# Patient Record
Sex: Male | Born: 1955
Health system: Southern US, Community
[De-identification: ages and names within clinical notes are randomized; demographics above are authoritative.]

## PROBLEM LIST (undated history)

## (undated) DIAGNOSIS — Z87898 Personal history of other specified conditions: Secondary | ICD-10-CM

## (undated) DIAGNOSIS — E785 Hyperlipidemia, unspecified: Secondary | ICD-10-CM

## (undated) DIAGNOSIS — Z9889 Other specified postprocedural states: Secondary | ICD-10-CM

## (undated) DIAGNOSIS — K219 Gastro-esophageal reflux disease without esophagitis: Secondary | ICD-10-CM

## (undated) DIAGNOSIS — Z85820 Personal history of malignant melanoma of skin: Secondary | ICD-10-CM

## (undated) DIAGNOSIS — Z972 Presence of dental prosthetic device (complete) (partial): Secondary | ICD-10-CM

## (undated) DIAGNOSIS — I10 Essential (primary) hypertension: Secondary | ICD-10-CM

## (undated) DIAGNOSIS — N400 Enlarged prostate without lower urinary tract symptoms: Secondary | ICD-10-CM

## (undated) DIAGNOSIS — K08109 Complete loss of teeth, unspecified cause, unspecified class: Secondary | ICD-10-CM

## (undated) DIAGNOSIS — R972 Elevated prostate specific antigen [PSA]: Secondary | ICD-10-CM

## (undated) DIAGNOSIS — M1990 Unspecified osteoarthritis, unspecified site: Secondary | ICD-10-CM

## (undated) DIAGNOSIS — G4730 Sleep apnea, unspecified: Secondary | ICD-10-CM

## (undated) DIAGNOSIS — C159 Malignant neoplasm of esophagus, unspecified: Secondary | ICD-10-CM

## (undated) HISTORY — DX: Malignant neoplasm of esophagus, unspecified: C15.9

## (undated) HISTORY — DX: Essential (primary) hypertension: I10

## (undated) HISTORY — PX: EYE SURGERY: SHX253

---

## 2007-10-24 HISTORY — PX: COLONOSCOPY: SHX174

## 2007-11-21 ENCOUNTER — Ambulatory Visit: Payer: Self-pay | Admitting: Infectious Diseases

## 2007-11-21 ENCOUNTER — Inpatient Hospital Stay (HOSPITAL_COMMUNITY): Admission: EM | Admit: 2007-11-21 | Discharge: 2007-11-22 | Payer: Self-pay | Admitting: Emergency Medicine

## 2007-11-21 ENCOUNTER — Encounter: Payer: Self-pay | Admitting: Infectious Diseases

## 2007-11-27 ENCOUNTER — Ambulatory Visit: Payer: Self-pay | Admitting: Hospitalist

## 2007-11-27 ENCOUNTER — Encounter (INDEPENDENT_AMBULATORY_CARE_PROVIDER_SITE_OTHER): Payer: Self-pay | Admitting: *Deleted

## 2007-11-27 DIAGNOSIS — E785 Hyperlipidemia, unspecified: Secondary | ICD-10-CM | POA: Insufficient documentation

## 2007-11-27 DIAGNOSIS — R0789 Other chest pain: Secondary | ICD-10-CM | POA: Insufficient documentation

## 2007-12-16 ENCOUNTER — Encounter (INDEPENDENT_AMBULATORY_CARE_PROVIDER_SITE_OTHER): Payer: Self-pay | Admitting: *Deleted

## 2008-01-28 ENCOUNTER — Ambulatory Visit: Payer: Self-pay | Admitting: Internal Medicine

## 2008-01-28 ENCOUNTER — Encounter (INDEPENDENT_AMBULATORY_CARE_PROVIDER_SITE_OTHER): Payer: Self-pay | Admitting: *Deleted

## 2008-01-28 LAB — CONVERTED CEMR LAB
ALT: 29 units/L (ref 0–53)
AST: 20 units/L (ref 0–37)
Albumin: 4.7 g/dL (ref 3.5–5.2)
Alkaline Phosphatase: 62 units/L (ref 39–117)
BUN: 14 mg/dL (ref 6–23)
CO2: 24 meq/L (ref 19–32)
Cholesterol: 186 mg/dL (ref 0–200)
Creatinine, Ser: 1.11 mg/dL (ref 0.40–1.50)
Glucose, Bld: 93 mg/dL (ref 70–99)
Sodium: 142 meq/L (ref 135–145)
Total Bilirubin: 0.6 mg/dL (ref 0.3–1.2)
Total Protein: 7.7 g/dL (ref 6.0–8.3)
Triglycerides: 232 mg/dL — ABNORMAL HIGH (ref <150)

## 2010-05-02 ENCOUNTER — Ambulatory Visit (HOSPITAL_COMMUNITY): Admission: RE | Admit: 2010-05-02 | Discharge: 2010-05-02 | Payer: Self-pay | Admitting: Surgery

## 2010-05-02 HISTORY — PX: OTHER SURGICAL HISTORY: SHX169

## 2011-01-08 LAB — SURGICAL PCR SCREEN: Staphylococcus aureus: NEGATIVE

## 2011-01-08 LAB — DIFFERENTIAL
Eosinophils Relative: 3 % (ref 0–5)
Lymphocytes Relative: 21 % (ref 12–46)
Monocytes Absolute: 0.6 10*3/uL (ref 0.1–1.0)

## 2011-01-08 LAB — BASIC METABOLIC PANEL
CO2: 27 mEq/L (ref 19–32)
Calcium: 9.8 mg/dL (ref 8.4–10.5)
Chloride: 106 mEq/L (ref 96–112)
GFR calc Af Amer: >60 mL/min (ref >60)
GFR calc non Af Amer: >60 mL/min (ref >60)
Glucose, Bld: 100 mg/dL — ABNORMAL HIGH (ref 70–99)
Potassium: 3.9 mEq/L (ref 3.5–5.1)
Sodium: 141 mEq/L (ref 135–145)

## 2011-01-08 LAB — CBC
HCT: 49.4 % (ref 39.0–52.0)
MCH: 35.5 pg — ABNORMAL HIGH (ref 26.0–34.0)
Platelets: 201 10*3/uL (ref 150–400)
RDW: 13.1 % (ref 11.5–15.5)

## 2011-03-07 NOTE — Discharge Summary (Signed)
NAME:  Nathaniel Jennings, Nathaniel Jennings NO.:  [id number]   MEDICAL RECORD NO.:  [id number]          PATIENT TYPE:  INP   LOCATION:  6529                         FACILITY:  [facility]   PHYSICIAN:  Lacretia Leigh. Hatcher, M.D.DATE OF BIRTH:  [birth year]   DATE OF ADMISSION:  11/21/2007  DATE OF DISCHARGE:  11/22/2007                               DISCHARGE SUMMARY   DISCHARGE DIAGNOSES:  1. Atypical chest discomfort.  2. Hypertension.   DISCHARGE MEDICATIONS:  The patient was discharged with no medications.  However, it was recommended that he try over-the-counter omeprazole  should he experience symptoms of gastroesophageal reflux.   The patient was discharged home and is to follow up in the outpatient  clinic with Dr. Laveda Abbe on November 27, 2007, at 11:15 a.m.  At that  time, the patient is to be evaluated for any further episodes of chest  discomfort as well as followed up with his elevated blood pressure.   PROCEDURES PERFORMED:  1. EKG:  Normal sinus rhythm, normal EKG.  2. Chest x-ray:  Impression:  Widened superior mediastinum, possible      artifact of AP projection, need PA and lateral for further      assessment.  3. CT angiogram of the chest:  Impression:  No acute findings, no      evidence of abdominal aortic aneurysm or dissection.  Possible tiny      gallstones.  4. CT angiogram of the abdomen:  Impression:  Again, no acute      findings, no evidence of abdominal aortic aneurysm or dissection.      Probable tiny gallstones.  5. A 2-D echocardiogram:  Overall left ventricular systolic function      was normal.  Left ventricular ejection fraction was estimated      between 55% and 60%.  There was no left ventricular regional wall      motion abnormalities.   CONSULTATIONS:  None.   ADMITTING HISTORY AND PHYSICAL:  The patient is a 55 year old male with  noncontributory medical history presenting with chest pressure and  bilateral upper and lower extremity tingling  sensations while driving to  work.  The morning of the incident, the patient describes the onset of  lower extremity tingling beginning proximally and extending distally  with concomitant upper extremity sensations.  He reports that at the  same time, he experienced what he describes as midline substernal  pressure.  The discomfort was waxing and waning in nature and lasted for  several hours throughout its course.  He denies chest pain, nausea,  vomiting, diaphoresis, light-headedness or radiation of the chest  discomfort but does endorse the feeling of needing to take a deep  breath.  All of this he attributes to indigestion.  Nothing worsened or  alleviated the symptoms.  He additionally denies cough, any recent  respiratory illness and no lower extremity edema or dyspnea on exertion  but does support having had palpitations in the past.   ADMITTING LABORATORIES:  Point of care:  Cardiac markers:  CK-MB less  than 1, troponin less than 0.05, myoglobin 65.4, D-dimer  0.25,  creatinine 1.1.  CBC:  7.2, hemoglobin 17.2, platelets 247.  Basic  metabolic panel revealed sodium of 139, potassium 3.6, chloride 106, BUN  16.   HOSPITAL COURSE:  Atypical chest discomfort:  While the patient has no  cardiac history, he does have several risk factors for coronary artery  disease, those being age over 20, gender, history of cigarette smoking,  and questionable hypertension.  Upon arrival to the emergency  department, the patient endorses sensation of his chest discomfort.  In  the emergency department, he was given 40 mg of Protonix IV push with  subsequent complete resolution of his symptoms.  Given the widened  mediastinum found on chest x-ray, further investigation with CT  angiogram of the chest was warranted out of concern for aortic  dissection.  Additionally, given the patient's risk factors, it was  deemed warranted to admit him and observe him overnight with further  cardiac workup.   Throughout the course of his 24-hour admission, patient  experienced no other episodes of chest pain, and there were no findings  as a result of his workup that were suggestive of any acute coronary  syndrome.  Additionally, the patient was maintained on Lopressor  throughout his 24-hour duration of stay which appropriately lowered his  blood pressure.  Upon awakening this morning, the patient was anxious to  depart the hospital, feeling much improved over the previous 24 hours.  He was educated on smoking cessation and encouraged to follow up in the  outpatient clinic to reassess his elevated blood pressures as well as  the complete resolution of his atypical chest discomfort.   DISCHARGE LABORATORIES AND VITALS:  VITALS:  Temp 97.6,  systolic BP  115/46, pulse 63, respiration 20, saturating 95% on room air.   CBC:  8.5, hemoglobin 15.2, platelets 217.  Basic metabolic panel:  098,  3.8, 106, 28, 11, 0.92, 86.  Cardiac enzymes negative x3 with final CK  113, MB 0.9, troponin 0.02.  Fasting lipid panel:  Cholesterol 172,  triglycerides 269, HDL 29, LDL 89; hemoglobin A1c was 5.4.  Urine  microalbumin 0.42.      Olene Craven, M.D.  Electronically Signed      Lacretia Leigh. Ninetta Lights, M.D.  Electronically Signed    MC/MEDQ  D:  11/22/2007  T:  11/22/2007  Job:  [id number]   cc:   Hollace Hayward, M.D.

## 2011-07-13 LAB — DIFFERENTIAL
Basophils Absolute: 0
Basophils Absolute: 0.1
Basophils Relative: 1
Eosinophils Absolute: 0.3
Eosinophils Absolute: 0.5
Eosinophils Relative: 4
Lymphocytes Relative: 32
Lymphs Abs: 3.3
Monocytes Absolute: 0.5
Monocytes Relative: 7
Monocytes Relative: 7
Neutro Abs: 4
Neutro Abs: 4
Neutrophils Relative %: 47
Neutrophils Relative %: 56

## 2011-07-13 LAB — CARDIAC PANEL(CRET KIN+CKTOT+MB+TROPI)
CK, MB: 0.9
Relative Index: 0.8
Relative Index: INVALID
Relative Index: INVALID
Total CK: 113
Total CK: 93
Troponin I: 0.02
Troponin I: <0.01

## 2011-07-13 LAB — POCT CARDIAC MARKERS
CKMB, poc: <1 — ABNORMAL LOW
CKMB, poc: <1 — ABNORMAL LOW
Myoglobin, poc: 65.4
Troponin i, poc: <0.05

## 2011-07-13 LAB — CREATININE, URINE, RANDOM: Creatinine, Urine: 77.5

## 2011-07-13 LAB — MICROALBUMIN, URINE: Microalb, Ur: 0.42

## 2011-07-13 LAB — CBC
Hemoglobin: 15.2
MCV: 97.7
Platelets: 247
RBC: 4.35
RBC: 4.99
RDW: 12.7
WBC: 7.2

## 2011-07-13 LAB — POCT I-STAT CREATININE: Creatinine, Ser: 1.1

## 2011-07-13 LAB — LIPID PANEL
Cholesterol: 172
LDL Cholesterol: 89
VLDL: 54 — ABNORMAL HIGH

## 2011-07-13 LAB — BASIC METABOLIC PANEL
CO2: 28
Chloride: 106
GFR calc non Af Amer: >60
Sodium: 140

## 2011-07-13 LAB — I-STAT 8, (EC8 V) (CONVERTED LAB): Glucose, Bld: 127 — ABNORMAL HIGH

## 2011-07-14 ENCOUNTER — Ambulatory Visit
Admission: RE | Admit: 2011-07-14 | Discharge: 2011-07-14 | Disposition: A | Discharge status: Home / Self Care | Payer: BC Managed Care – PPO | Source: Ambulatory Visit | Attending: Physician Assistant | Admitting: Physician Assistant

## 2011-07-14 ENCOUNTER — Other Ambulatory Visit: Payer: Self-pay | Admitting: Physician Assistant

## 2011-07-14 DIAGNOSIS — M25562 Pain in left knee: Secondary | ICD-10-CM

## 2011-07-14 DIAGNOSIS — M25462 Effusion, left knee: Secondary | ICD-10-CM

## 2011-07-14 DIAGNOSIS — M25561 Pain in right knee: Secondary | ICD-10-CM

## 2011-07-14 DIAGNOSIS — M25461 Effusion, right knee: Secondary | ICD-10-CM

## 2012-11-15 ENCOUNTER — Other Ambulatory Visit (HOSPITAL_COMMUNITY): Payer: Self-pay | Admitting: Cardiovascular Disease

## 2012-11-15 DIAGNOSIS — I10 Essential (primary) hypertension: Secondary | ICD-10-CM

## 2012-11-19 ENCOUNTER — Ambulatory Visit (HOSPITAL_COMMUNITY)
Admission: RE | Admit: 2012-11-19 | Discharge: 2012-11-19 | Disposition: A | Discharge status: Home / Self Care | Payer: BC Managed Care – PPO | Source: Ambulatory Visit | Attending: Cardiovascular Disease | Admitting: Cardiovascular Disease

## 2012-11-19 DIAGNOSIS — R002 Palpitations: Secondary | ICD-10-CM | POA: Insufficient documentation

## 2012-11-19 DIAGNOSIS — I059 Rheumatic mitral valve disease, unspecified: Secondary | ICD-10-CM | POA: Insufficient documentation

## 2012-11-19 DIAGNOSIS — I10 Essential (primary) hypertension: Secondary | ICD-10-CM

## 2012-11-19 DIAGNOSIS — I369 Nonrheumatic tricuspid valve disorder, unspecified: Secondary | ICD-10-CM | POA: Insufficient documentation

## 2012-11-19 HISTORY — PX: TRANSTHORACIC ECHOCARDIOGRAM: SHX275

## 2012-11-19 NOTE — Progress Notes (Signed)
2D Echo Performed 11/19/2012    Dann Galicia, RCS  

## 2013-03-13 ENCOUNTER — Encounter: Payer: Self-pay | Admitting: Cardiovascular Disease

## 2013-03-14 ENCOUNTER — Ambulatory Visit (INDEPENDENT_AMBULATORY_CARE_PROVIDER_SITE_OTHER): Payer: BC Managed Care – PPO | Admitting: Cardiovascular Disease

## 2013-03-14 ENCOUNTER — Encounter: Payer: Self-pay | Admitting: Cardiovascular Disease

## 2013-03-14 VITALS — BP 136/88 | HR 57 | Ht 67.0 in | Wt 179.3 lb

## 2013-03-14 DIAGNOSIS — I10 Essential (primary) hypertension: Secondary | ICD-10-CM

## 2013-03-14 DIAGNOSIS — E785 Hyperlipidemia, unspecified: Secondary | ICD-10-CM

## 2013-03-14 DIAGNOSIS — R42 Dizziness and giddiness: Secondary | ICD-10-CM

## 2013-03-14 MED ORDER — NIACIN ER (ANTIHYPERLIPIDEMIC) 500 MG PO TBCR: 500.0000 mg | EXTENDED_RELEASE_TABLET | Freq: Every day | ORAL | Status: DC

## 2013-03-14 NOTE — Progress Notes (Deleted)
Patient ID: Nathaniel Jennings, male    DOB: 1955-10-28, 57 y.o.   MRN: [id number]  HPI: Nathaniel Jennings, is a 57 y.o. male who presents to the office today for cardiologic evaluation. He has a history of hypertension, as well as tachypalpitations. Most recently he has been on metoprolol succinate to control  ectopy. When I saw him in February 2014, I further titrate his metoprolol succinate to 75 mg daily. Patient does work [employer] representative. He has been found to have elevated lipids consistent with an atherogenic dyslipidemia pattern. He recently also had an episode of vertigo. He has a remote tobacco history and starting at age17 but quit approximately 2 years ago.  WhenI had initially seen Nathaniel Jennings, his total cholesterol was 192, triglycerides 228, VLDL 46, HDL 38, LDL cholesterol 108. I started him on simvastatin 20 mg. He subsequently underwent an MR profile which did show but still is consistent with an atherogenic dyslipidemia pattern. Total cholesterol is now 138. However, triglycerides are elevated at 219 HDL 37. LDL is 57. However, he still has increased small LDL particles of 765. His HDL is low at 26.9. The LDL size is increased at 54.1. He also has significant increased insulin resistance were 85 with metabolic syndrome insulin resistance.he presents to the office today for followup evaluation.  Past Medical History  Diagnosis Date  . Hypertension   . Melanoma   . Palpitations 11/19/2012    2D Echo - normal study    No past surgical history on file.  No Known Allergies  Current Outpatient Prescriptions  Medication Sig Dispense Refill  . aspirin 81 MG tablet Take 81 mg by mouth daily.      . meclizine (ANTIVERT) 25 MG tablet Take 25 mg by mouth 3 (three) times daily as needed.      . metoprolol succinate (TOPROL-XL) 50 MG 24 hr tablet Take 75 mg by mouth daily. Take with or immediately following a meal.      . omeprazole (PRILOSEC) 20 MG capsule Take 20 mg by mouth daily.      .  simvastatin (ZOCOR) 20 MG tablet Take 20 mg by mouth every evening.       No current facility-administered medications for this visit.    [name] He has 2 children 6 grandchildren. He quit tobacco 2 years ago. He does drink approximately 10 beers per week. He does admit to having non diet soft drinks.  ROVis negative for fever chills and night sweats. He denies or palpitations. He did have an episode of vertigo recently. He has tolerated the initiation of simvastatin and denies myalgias or arthralgias.there is no chest pain. He denies PND orthopnea. He denies bleeding. Other system review is negative.  PE BP 136/88  Pulse 57  Ht 5\' 7"  (1.702 m)  Wt 179 lb 4.8 oz (81.33 kg)  BMI 28.08 kg/m2  General: Alert, oriented, no distress.  HEENT: Normocephalic, atraumatic. Pupils round and reactive; sclera anicteric; Fundi ** Nose with nasal septal hypertrophy Mouth/Parynx benign; Mallinpatti scale 2 Neck: No JVD, no carotid briuts Lungs: clear to ausculatation and percussion; no wheezing or rales Heart: RRR, s1 s2 normal  Abdomen: soft, nontender; no hepatosplenomehaly, BS+; abdominal aorta nontender and not dilated by palpation. Pulses 2+ Extremities: no clubbinbg cyanosis or edema, Homan's sign negative  Neurologic: grossly nonfocal   ECG sinus rhythm at 57 per minute. Normal intervals.  LABS:  BMET    Component Value Date/Time   NA 141 04/28/2010 1030   K  3.9 04/28/2010 1030   CL 106 04/28/2010 1030   CO2 27 04/28/2010 1030   GLUCOSE 100* 04/28/2010 1030   BUN 14 04/28/2010 1030   CREATININE 1.04 04/28/2010 1030   CALCIUM 9.8 04/28/2010 1030   GFRNONAA >60 04/28/2010 1030   GFRAA  Value: >60        The eGFR has been calculated using the MDRD equation. This calculation has not been validated in all clinical situations. eGFR's persistently <60 mL/min signify possible Chronic Kidney Disease. 04/28/2010 1030     Hepatic Function Panel     Component Value Date/Time   PROT 7.7 01/28/2008 2019   ALBUMIN  4.7 01/28/2008 2019   AST 20 01/28/2008 2019   ALT 29 01/28/2008 2019   ALKPHOS 62 01/28/2008 2019   BILITOT 0.6 01/28/2008 2019     CBC    Component Value Date/Time   WBC 7.7 04/28/2010 1030   RBC 4.95 04/28/2010 1030   HGB 17.6 REPEATED TO VERIFY* 04/28/2010 1030   HCT 49.4 04/28/2010 1030   PLT 201 04/28/2010 1030   MCV 98.8 04/28/2010 1030   MCH 35.5* 04/28/2010 1030   MCHC 35.9 04/28/2010 1030   RDW 13.1 04/28/2010 1030   LYMPHSABS 1.6 04/28/2010 1030   MONOABS 0.6 04/28/2010 1030   EOSABS 0.2 04/28/2010 1030   BASOSABS 0.1 04/28/2010 1030     BNP No results found for this basename: probnp    Lipid Panel     Component Value Date/Time   CHOL 186 01/28/2008 2019   TRIG 232* 01/28/2008 2019   HDL 38* 01/28/2008 2019   CHOLHDL 4.9 Ratio 01/28/2008 2019   VLDL 46* 01/28/2008 2019   LDLCALC 102* 01/28/2008 2019     RADIOLOGY: No results found.    ASSESSMENT AND PLAN:  From a cardiac standpoint, Nathaniel Jennings blood pressure is well controlled. He is not having any further activity. I did review his NMR lipoprotein detail. He does have significant increased insulin resistance and metabolic syndrome. I have recommended the addition of Niaspan 500 mg to his simvastatin 20 mg. I also have recommended initiation of over-the-counter fish oil. We discussed reduction of sugars and sweets. We discussed changing to diet soft drinks. Discussed increased exercise. A repeat NMR lipoprotein I will be obtained in 3 months. I will see him in 4 months for followup and evaluation.      KELLY,THOMAS A 03/14/2013 8:55 AM    HPI    Review of Systems    Physical Exam     .

## 2013-03-14 NOTE — Patient Instructions (Addendum)
Your physician has recommended you make the following change in your medication: Start Niaspan ER 500. This will be sent in to your pharmacy.Continue meds as before. Buy some over the counter fish oil and take as directed.  Your physician recommends that you return for lab work in: 3months.   Your physician recommends that you schedule a follow-up appointment in: 4 montjs. Nathaniel Jennings

## 2013-03-14 NOTE — Progress Notes (Signed)
HPI: Nathaniel Jennings, is a 57 y.o. male who presents to the office today for cardiologic evaluation. He has a history of hypertension, as well as tachypalpitations. Most recently he has been on metoprolol succinate to control  ectopy. When I saw him in February 2014, I further titrate his metoprolol succinate to 75 mg daily. Patient does work [employer] representative. He has been found to have elevated lipids consistent with an atherogenic dyslipidemia pattern. He recently also had an episode of vertigo. He has a remote tobacco history and starting at age17 but quit approximately 2 years ago.  When I had initially seen Nathaniel Jennings, his total cholesterol was 192, triglycerides 228, VLDL 46, HDL 38, LDL cholesterol 108. I started him on simvastatin 20 mg. He subsequently underwent an MR profile which did show but still is consistent with an atherogenic dyslipidemia pattern. Total cholesterol is now 138. However, triglycerides are elevated at 219 HDL 37. LDL is 57. However, he still has increased small LDL particles of 765. His HDL is low at 26.9. The LDL size is increased at 54.1. He also has significant increased insulin resistance with an IR score of 85 consistent with metabolic syndrome/insulin resistance. He presents to the office today for followup evaluation.      Past Medical History  Diagnosis Date  . Hypertension   . Melanoma   . Palpitations 11/19/2012    2D Echo - normal study    No past surgical history on file.  No Known Allergies  Current Outpatient Prescriptions  Medication Sig Dispense Refill  . aspirin 81 MG tablet Take 81 mg by mouth daily.      . meclizine (ANTIVERT) 25 MG tablet Take 25 mg by mouth 3 (three) times daily as needed.      . metoprolol succinate (TOPROL-XL) 50 MG 24 hr tablet Take 75 mg by mouth daily. Take with or immediately following a meal.      . omeprazole (PRILOSEC) 20 MG capsule Take 20 mg by mouth daily.      . simvastatin (ZOCOR) 20 MG tablet Take 20 mg by mouth every  evening.       No current facility-administered medications for this visit.    Socially he has 2 children 6 grandchildren. He quit tobacco 2 years ago. He does drink approximately 10 beers per week. He does admit to having non diet soft drinks.  ROV is negative for fever chills and night sweats. He denies or palpitations. He did have an episode of vertigo recently. He has tolerated the initiation of simvastatin and denies myalgias or arthralgias.there is no chest pain. He denies PND orthopnea. He denies bleeding. Other system review is negative.  PE BP 136/88  Pulse 57  Ht 5\' 7"  (1.702 m)  Wt 179 lb 4.8 oz (81.33 kg)  BMI 28.08 kg/m2  General: Alert, oriented, no distress.  HEENT: Normocephalic, atraumatic. Pupils round and reactive; sclera anicteric; Fundi ** Nose with nasal septal hypertrophy Mouth/Parynx benign; Mallinpatti scale 2 Neck: No JVD, no carotid briuts Lungs: clear to ausculatation and percussion; no wheezing or rales Heart: RRR, s1 s2 normal 1/6 sem Abdomen: soft, nontender; no hepatosplenomehaly, BS+; abdominal aorta nontender and not dilated by palpation. Pulses 2+ Extremities: no clubbinbg cyanosis or edema, Homan's sign negative  Neurologic: grossly nonfocal   ECG sinus rhythm at 57 per minute. Normal intervals.  LABS:  BMET    Component Value Date/Time   NA 141 04/28/2010 1030   K 3.9 04/28/2010 1030   CL 106  04/28/2010 1030   CO2 27 04/28/2010 1030   GLUCOSE 100* 04/28/2010 1030   BUN 14 04/28/2010 1030   CREATININE 1.04 04/28/2010 1030   CALCIUM 9.8 04/28/2010 1030   GFRNONAA >60 04/28/2010 1030   GFRAA  Value: >60        The eGFR has been calculated using the MDRD equation. This calculation has not been validated in all clinical situations. eGFR's persistently <60 mL/min signify possible Chronic Kidney Disease. 04/28/2010 1030     Hepatic Function Panel     Component Value Date/Time   PROT 7.7 01/28/2008 2019   ALBUMIN 4.7 01/28/2008 2019   AST 20 01/28/2008 2019    ALT 29 01/28/2008 2019   ALKPHOS 62 01/28/2008 2019   BILITOT 0.6 01/28/2008 2019     CBC    Component Value Date/Time   WBC 7.7 04/28/2010 1030   RBC 4.95 04/28/2010 1030   HGB 17.6 REPEATED TO VERIFY* 04/28/2010 1030   HCT 49.4 04/28/2010 1030   PLT 201 04/28/2010 1030   MCV 98.8 04/28/2010 1030   MCH 35.5* 04/28/2010 1030   MCHC 35.9 04/28/2010 1030   RDW 13.1 04/28/2010 1030   LYMPHSABS 1.6 04/28/2010 1030   MONOABS 0.6 04/28/2010 1030   EOSABS 0.2 04/28/2010 1030   BASOSABS 0.1 04/28/2010 1030     BNP No results found for this basename: probnp    Lipid Panel     Component Value Date/Time   CHOL 186 01/28/2008 2019   TRIG 232* 01/28/2008 2019   HDL 38* 01/28/2008 2019   CHOLHDL 4.9 Ratio 01/28/2008 2019   VLDL 46* 01/28/2008 2019   LDLCALC 102* 01/28/2008 2019     RADIOLOGY: No results found.    ASSESSMENT AND PLAN:  From a cardiac standpoint, Nathaniel Jennings blood pressure is well controlled. He is not having any further activity on metoprolol. I did review his NMR lipoprotein detail. He does have significant increased insulin resistance and metabolic syndrome. I have recommended the addition of Niaspan 500 mg to his simvastatin 20 mg. I also have recommended initiation of over-the-counter fish oil. We discussed reduction of sugars and sweets, dietary modification. We discussed changing to diet soft drinks. Discussed increased exercise. A repeat NMR lipoprotein I will be obtained in 3 months. I will see him in 4 months for followup and evaluation.      KELLY,THOMAS A 03/14/2013 8:55 AM

## 2013-07-15 ENCOUNTER — Other Ambulatory Visit: Payer: Self-pay | Admitting: Cardiovascular Disease

## 2013-07-15 NOTE — Telephone Encounter (Signed)
Rx was sent to pharmacy electronically. 

## 2014-04-13 ENCOUNTER — Other Ambulatory Visit: Payer: Self-pay | Admitting: *Deleted

## 2014-04-13 MED ORDER — METOPROLOL SUCCINATE ER 50 MG PO TB24: ORAL_TABLET | ORAL | Status: DC

## 2014-04-13 NOTE — Telephone Encounter (Signed)
Rx refill sent to patient pharmacy   

## 2014-04-30 ENCOUNTER — Other Ambulatory Visit: Payer: Self-pay | Admitting: *Deleted

## 2014-04-30 MED ORDER — METOPROLOL SUCCINATE ER 50 MG PO TB24: ORAL_TABLET | ORAL | Status: DC

## 2014-04-30 NOTE — Telephone Encounter (Signed)
Rx was sent to pharmacy electronically. 

## 2015-06-25 ENCOUNTER — Other Ambulatory Visit: Payer: Self-pay | Admitting: Urology

## 2015-06-25 ENCOUNTER — Encounter (HOSPITAL_BASED_OUTPATIENT_CLINIC_OR_DEPARTMENT_OTHER): Payer: Self-pay | Admitting: *Deleted

## 2015-06-29 ENCOUNTER — Encounter (HOSPITAL_BASED_OUTPATIENT_CLINIC_OR_DEPARTMENT_OTHER): Payer: Self-pay | Admitting: *Deleted

## 2015-06-29 ENCOUNTER — Other Ambulatory Visit: Payer: Self-pay | Admitting: Urology

## 2015-06-29 NOTE — Progress Notes (Signed)
NPO AFTER MN.  ARRIVE AT 0715.  NEEDS ISTAT AND EKG.  WILL TAKE PRILOSEC AND TOPROL AM DOS W/ SIPS OF WATER AND DO FLEET ENEMA.

## 2015-07-01 NOTE — H&P (Signed)
History of Present Illness Nathaniel Jennings is scheduled for ultrasound prostate biopsy for elevated PSA. His PSA has been slowly trending up. It was 3.4 in May 2015, 4.1 in May of this year, 4.5 on June 22. Repeat PSA on 7/18 is 4.08 with 18% free PSA. He could not tolerate the ultrasound probe. It was too painful to him. The procedure was cancelled. Will schedule under general anesthesia. He does not have a family history of prostate cancer.   Past Medical History Problems  1. History of arthritis (Z87.39) 2. History of heartburn (Z87.898) 3. History of hypercholesterolemia (Z86.39) 4. History of hypertension (Z86.79) 5. History of nonmelanoma skin cancer (Z66.294)  Surgical History Problems  1. History of Skin Debridement  Current Meds 1. Garlic CAPS;  Therapy: (Recorded:18Jul2016) to Recorded 2. Levofloxacin 500 MG Oral Tablet (Levaquin); Take 1 pill day before procedure, Take 1 pill  day of procedure, Take 1 pill day after procedure;  Therapy: 21Jul2016 to (Last Rx:21Jul2016)  Requested for: 21Jul2016 Ordered 3. Metoprolol Tartrate 75 MG Oral Tablet;  Therapy: (Recorded:18Jul2016) to Recorded 4. Multi-Vitamin TABS;  Therapy: (Recorded:18Jul2016) to Recorded 5. Omega 3 CAPS;  Therapy: (Recorded:18Jul2016) to Recorded 6. PriLOSEC 20 MG Oral Capsule Delayed Release (Omeprazole);  Therapy: (Recorded:18Jul2016) to Recorded 7. Vitamin B-12 TABS;  Therapy: (Recorded:18Jul2016) to Recorded  Allergies Medication  1. No Known Drug Allergies  Family History Problems  1. Family history of Black lung : Father 2. Family history of renal failure (Z84.1) : Mother  Social History Problems  1. Father deceased   15 black lung 2. Former smoker [id number]   2 pkd for 30 yrs quit 6 yrs ago 3. Mother deceased   61 renal failure 4. Number of children   1 son/ 1 daughter 5. Occupation   Architect work / Librarian, academic  Review of Systems Genitourinary, constitutional, skin,  eye, otolaryngeal, hematologic/lymphatic, cardiovascular, pulmonary, endocrine, musculoskeletal, gastrointestinal, neurological and psychiatric system(s) were reviewed and pertinent findings if present are noted and are otherwise negative.  Genitourinary: urinary frequency and urinary stream starts and stops.  Gastrointestinal: heartburn.    Physical Exam Constitutional: Well nourished and well developed . No acute distress.  ENT:. The ears and nose are normal in appearance.  Neck: The appearance of the neck is normal and no neck mass is present.  Pulmonary: No respiratory distress and normal respiratory rhythm and effort.  Cardiovascular: Heart rate and rhythm are normal . No peripheral edema.  Abdomen: The abdomen is soft and nontender. No masses are palpated. No CVA tenderness. No hernias are palpable. No hepatosplenomegaly noted.  Rectal: Rectal exam demonstrates normal sphincter tone, no tenderness and no masses. Estimated prostate size is 2+. The prostate has no nodularity, is not indurated and is not tender. The left seminal vesicle is nonpalpable. The right seminal vesicle is nonpalpable. The perineum is normal on inspection.  Genitourinary: Examination of the penis demonstrates no discharge, no masses, no lesions and a normal meatus. The scrotum is without lesions. The right epididymis is palpably normal and non-tender. The left epididymis is palpably normal and non-tender. The right testis is non-tender and without masses. The left testis is non-tender and without masses.  Lymphatics: The femoral and inguinal nodes are not enlarged or tender.  Skin: Normal skin turgor, no visible rash and no visible skin lesions.  Neuro/Psych:. Mood and affect are appropriate.    Results/Data Urine [Data Includes: Last 1 Day]   [id number]  COLOR YELLOW   APPEARANCE CLEAR   SPECIFIC GRAVITY 1.010  pH 6.5   GLUCOSE NEGATIVE   BILIRUBIN NEGATIVE   KETONE NEGATIVE   BLOOD NEGATIVE   PROTEIN NEGATIVE    NITRITE NEGATIVE   LEUKOCYTE ESTERASE NEGATIVE    Assessment Assessed  1. Elevated prostate specific antigen (PSA) (R97.2)  Plan Benign localized prostatic hyperplasia with lower urinary tract symptoms (LUTS)  1. BIOPSY; Status:Hold For - Specimen/Data Collection; Requested for:01Sep2016;  Elevated prostate specific antigen (PSA)  2. PROSTATE BIOPSY ULTRASOUND; Status:Active; Requested for:01Sep2016;  Health Maintenance  3. UA With REFLEX; [Do Not Release]; Status:Resulted - Requires Verification;   Done:  [id number] 11:01AM  Ultrasound guided prostate biopsy under general anesthesia.

## 2015-07-02 ENCOUNTER — Encounter (HOSPITAL_BASED_OUTPATIENT_CLINIC_OR_DEPARTMENT_OTHER): Payer: Self-pay | Admitting: *Deleted

## 2015-07-02 ENCOUNTER — Ambulatory Visit (HOSPITAL_BASED_OUTPATIENT_CLINIC_OR_DEPARTMENT_OTHER): Payer: BLUE CROSS/BLUE SHIELD | Admitting: Certified Registered"

## 2015-07-02 ENCOUNTER — Ambulatory Visit (HOSPITAL_BASED_OUTPATIENT_CLINIC_OR_DEPARTMENT_OTHER)
Admission: RE | Admit: 2015-07-02 | Discharge: 2015-07-02 | Disposition: A | Discharge status: Home / Self Care | Payer: BLUE CROSS/BLUE SHIELD | Source: Ambulatory Visit | Attending: Urology | Admitting: Urology

## 2015-07-02 ENCOUNTER — Other Ambulatory Visit: Payer: Self-pay

## 2015-07-02 ENCOUNTER — Encounter (HOSPITAL_BASED_OUTPATIENT_CLINIC_OR_DEPARTMENT_OTHER)
Admission: RE | Disposition: A | Discharge status: Home / Self Care | Payer: Self-pay | Source: Ambulatory Visit | Attending: Urology

## 2015-07-02 DIAGNOSIS — I10 Essential (primary) hypertension: Secondary | ICD-10-CM | POA: Insufficient documentation

## 2015-07-02 DIAGNOSIS — Z87891 Personal history of nicotine dependence: Secondary | ICD-10-CM | POA: Insufficient documentation

## 2015-07-02 DIAGNOSIS — Z79899 Other long term (current) drug therapy: Secondary | ICD-10-CM | POA: Insufficient documentation

## 2015-07-02 DIAGNOSIS — K219 Gastro-esophageal reflux disease without esophagitis: Secondary | ICD-10-CM | POA: Diagnosis not present

## 2015-07-02 DIAGNOSIS — R972 Elevated prostate specific antigen [PSA]: Secondary | ICD-10-CM | POA: Diagnosis present

## 2015-07-02 DIAGNOSIS — M1990 Unspecified osteoarthritis, unspecified site: Secondary | ICD-10-CM | POA: Insufficient documentation

## 2015-07-02 DIAGNOSIS — E780 Pure hypercholesterolemia: Secondary | ICD-10-CM | POA: Diagnosis not present

## 2015-07-02 HISTORY — DX: Personal history of other specified conditions: Z87.898

## 2015-07-02 HISTORY — DX: Elevated prostate specific antigen (PSA): R97.20

## 2015-07-02 HISTORY — DX: Other specified postprocedural states: Z98.890

## 2015-07-02 HISTORY — DX: Complete loss of teeth, unspecified cause, unspecified class: Z97.2

## 2015-07-02 HISTORY — PX: PROSTATE BIOPSY: SHX241

## 2015-07-02 HISTORY — DX: Complete loss of teeth, unspecified cause, unspecified class: K08.109

## 2015-07-02 HISTORY — DX: Hyperlipidemia, unspecified: E78.5

## 2015-07-02 HISTORY — DX: Personal history of malignant melanoma of skin: Z85.820

## 2015-07-02 LAB — POCT I-STAT, CHEM 8
BUN: 20 mg/dL (ref 6–20)
Calcium, Ion: 1.26 mmol/L — ABNORMAL HIGH (ref 1.12–1.23)
Chloride: 102 mmol/L (ref 101–111)
Creatinine, Ser: 1 mg/dL (ref 0.61–1.24)
Glucose, Bld: 102 mg/dL — ABNORMAL HIGH (ref 65–99)
HEMATOCRIT: 46 % (ref 39.0–52.0)
HEMOGLOBIN: 15.6 g/dL (ref 13.0–17.0)
Potassium: 4.4 mmol/L (ref 3.5–5.1)
SODIUM: 140 mmol/L (ref 135–145)
TCO2: 26 mmol/L (ref 0–100)

## 2015-07-02 SURGERY — BIOPSY, PROSTATE, RECTAL APPROACH, WITH US GUIDANCE
Anesthesia: Monitor Anesthesia Care

## 2015-07-02 MED ORDER — MIDAZOLAM HCL 5 MG/5ML IJ SOLN
INTRAMUSCULAR | Status: DC | PRN
  Administered 2015-07-02: 2 mg via INTRAVENOUS

## 2015-07-02 MED ORDER — LACTATED RINGERS IV SOLN
INTRAVENOUS | Status: DC
  Filled 2015-07-02: qty 1000

## 2015-07-02 MED ORDER — MEPERIDINE HCL 25 MG/ML IJ SOLN
6.2500 mg | INTRAMUSCULAR | Status: DC | PRN
  Filled 2015-07-02: qty 1

## 2015-07-02 MED ORDER — EPHEDRINE SULFATE 50 MG/ML IJ SOLN
INTRAMUSCULAR | Status: DC | PRN
  Administered 2015-07-02: 5 mg via INTRAVENOUS
  Administered 2015-07-02: 10 mg via INTRAVENOUS

## 2015-07-02 MED ORDER — MIDAZOLAM HCL 2 MG/2ML IJ SOLN
INTRAMUSCULAR | Status: AC
  Filled 2015-07-02: qty 2

## 2015-07-02 MED ORDER — FENTANYL CITRATE (PF) 100 MCG/2ML IJ SOLN
25.0000 ug | INTRAMUSCULAR | Status: DC | PRN
  Filled 2015-07-02: qty 1

## 2015-07-02 MED ORDER — CEFTRIAXONE SODIUM 2 G IJ SOLR
INTRAMUSCULAR | Status: AC
  Filled 2015-07-02: qty 2

## 2015-07-02 MED ORDER — LACTATED RINGERS IV SOLN
INTRAVENOUS | Status: DC
  Administered 2015-07-02: 08:00:00 via INTRAVENOUS
  Filled 2015-07-02: qty 1000

## 2015-07-02 MED ORDER — DEXTROSE 5 % IV SOLN
1.0000 g | INTRAVENOUS | Status: DC
  Filled 2015-07-02: qty 10

## 2015-07-02 MED ORDER — PROPOFOL 500 MG/50ML IV EMUL
INTRAVENOUS | Status: DC | PRN
  Administered 2015-07-02: 160 ug/kg/min via INTRAVENOUS

## 2015-07-02 MED ORDER — FENTANYL CITRATE (PF) 100 MCG/2ML IJ SOLN
INTRAMUSCULAR | Status: DC | PRN
  Administered 2015-07-02: 50 ug via INTRAVENOUS

## 2015-07-02 MED ORDER — PROMETHAZINE HCL 25 MG/ML IJ SOLN
6.2500 mg | INTRAMUSCULAR | Status: DC | PRN
  Filled 2015-07-02: qty 1

## 2015-07-02 MED ORDER — FENTANYL CITRATE (PF) 100 MCG/2ML IJ SOLN
INTRAMUSCULAR | Status: AC
  Filled 2015-07-02: qty 4

## 2015-07-02 MED ORDER — DEXTROSE 5 % IV SOLN
2.0000 g | INTRAVENOUS | Status: AC
  Administered 2015-07-02: 2 g via INTRAVENOUS
  Filled 2015-07-02: qty 2

## 2015-07-02 SURGICAL SUPPLY — 19 items
BAG URINE LEG 19OZ MD ST LTX (BAG) IMPLANT
CATH FOLEY 2WAY SLVR  5CC 16FR (CATHETERS)
CATH FOLEY 2WAY SLVR 5CC 16FR (CATHETERS) IMPLANT
DRSG TEGADERM 4X4.75 (GAUZE/BANDAGES/DRESSINGS) IMPLANT
DRSG TEGADERM 8X12 (GAUZE/BANDAGES/DRESSINGS) IMPLANT
DRSG TELFA 3X8 NADH (GAUZE/BANDAGES/DRESSINGS) ×2 IMPLANT
GAUZE SPONGE 4X4 12PLY STRL LF (GAUZE/BANDAGES/DRESSINGS) IMPLANT
GLOVE BIO SURGEON STRL SZ7 (GLOVE) ×2 IMPLANT
MANIFOLD NEPTUNE II (INSTRUMENTS) IMPLANT
NDL SAFETY ECLIPSE 18X1.5 (NEEDLE) IMPLANT
NEEDLE HYPO 18GX1.5 SHARP (NEEDLE)
NEEDLE SPNL 22GX7 QUINCKE BK (NEEDLE) IMPLANT
PLUG CATH AND CAP STER (CATHETERS) IMPLANT
SET IRRIG Y TYPE TUR BLADDER L (SET/KITS/TRAYS/PACK) IMPLANT
SURGILUBE 2OZ TUBE FLIPTOP (MISCELLANEOUS) ×2 IMPLANT
SYR 20CC LL (SYRINGE) IMPLANT
SYRINGE 10CC LL (SYRINGE) IMPLANT
TOWEL OR 17X24 6PK STRL BLUE (TOWEL DISPOSABLE) ×2 IMPLANT
UNDERPAD 30X30 INCONTINENT (UNDERPADS AND DIAPERS) ×2 IMPLANT

## 2015-07-02 NOTE — Anesthesia Postprocedure Evaluation (Signed)
  Anesthesia Post-op Note  Patient: Nathaniel Jennings  Procedure(s) Performed: Procedure(s) (LRB): BIOPSY TRANSRECTAL ULTRASONIC PROSTATE (TUBP) (N/A)  Patient Location: PACU  Anesthesia Type: MAC  Level of Consciousness: awake and alert   Airway and Oxygen Therapy: Patient Spontanous Breathing  Post-op Pain: mild  Post-op Assessment: Post-op Vital signs reviewed, Patient's Cardiovascular Status Stable, Respiratory Function Stable, Patent Airway and No signs of Nausea or vomiting  Last Vitals:  Filed Vitals:   07/02/15 1022  BP: 140/83  Pulse: 61  Temp: 36.4 C  Resp: 16    Post-op Vital Signs: stable   Complications: No apparent anesthesia complications

## 2015-07-02 NOTE — Op Note (Signed)
Nathaniel Jennings is a 59 y.o.   07/02/2015  General  Preop diagnosis: Elevated PSA  Postop diagnosis: Same  Procedure done: Ultrasound guided prostate biopsy  Surgeon: Charlene Brooke. Ariel Wingrove  Anesthesia: Monitored anesthesia care  Indication: Patient is a 59 years old male who has a progressively rising PSA. It was 3.4 in May 2015, 4.1 in May 2016 4.5 on June 22. Repeat PSA on 7/18 is a 4.08 with 18% free PSA. Patient was scheduled for ultrasound prostate biopsy under local anesthesia. He could not tolerate the ultrasound probe. He is now scheduled for the procedure under anesthesia.  Procedure: Patient was identified by his wrist band and proper timeout was taken.  Under monitored anesthesia care he was prepped and draped and placed in the left lateral decubitus position. The transducer was inserted in the rectum. The seminal vesicles are normal. There is moderate prostatic hypertrophy. There is a prostatic cyst in the mid right lobe of the prostate. The prostate measures a 4.86 cm in length, 3.77 cm in height and 5.66 cm in width. The prostatic volume is 54.40 cc.  Under ultrasound guidance and with the biopsy needle a biopsy of the right lateral base and right medial base of the prostate was done. Biopsy of the right lateral mid gland and medial mid gland was done. Then a biopsy of the right lateral apex and right medial apex was done.  Biopsy of the left lateral and left medial base was done. Biopsy of the left lateral and left medial mid gland was done. Then biopsy of the left lateral apex and left medial apex was done. A total of 12 cores were obtained.  The transducer was then removed. Pressure was then applied to the prostate with a gauze in the rectum. There was no evidence of bleeding at the end of the procedure.   EBL: Minimal  Needles, sponges count: Correct  Patient tolerated the procedure well and left the OR in satisfactory condition to postanesthesia care unit.

## 2015-07-02 NOTE — Anesthesia Preprocedure Evaluation (Addendum)
Anesthesia Evaluation  Patient identified by MRN, date of birth, ID band Patient awake    Reviewed: Allergy & Precautions, NPO status , Patient's Chart, lab work & pertinent test results  Airway Mallampati: II  TM Distance: >3 FB Neck ROM: Full    Dental no notable dental hx. (+) Upper Dentures, Lower Dentures   Pulmonary neg pulmonary ROS, former smoker,    Pulmonary exam normal breath sounds clear to auscultation       Cardiovascular hypertension, Pt. on medications negative cardio ROS Normal cardiovascular exam Rhythm:Regular Rate:Normal     Neuro/Psych negative neurological ROS  negative psych ROS   GI/Hepatic Neg liver ROS, GERD  Medicated and Controlled,  Endo/Other  negative endocrine ROS  Renal/GU negative Renal ROS  negative genitourinary   Musculoskeletal negative musculoskeletal ROS (+)   Abdominal   Peds negative pediatric ROS (+)  Hematology negative hematology ROS (+)   Anesthesia Other Findings   Reproductive/Obstetrics negative OB ROS                            Anesthesia Physical Anesthesia Plan  ASA: II  Anesthesia Plan: MAC   Post-op Pain Management:    Induction:   Airway Management Planned: Simple Face Mask  Additional Equipment:   Intra-op Plan:   Post-operative Plan:   Informed Consent: I have reviewed the patients History and Physical, chart, labs and discussed the procedure including the risks, benefits and alternatives for the proposed anesthesia with the patient or authorized representative who has indicated his/her understanding and acceptance.   Dental advisory given  Plan Discussed with: CRNA  Anesthesia Plan Comments:        Anesthesia Quick Evaluation

## 2015-07-02 NOTE — Discharge Instructions (Signed)

## 2015-07-02 NOTE — Transfer of Care (Signed)
Immediate Anesthesia Transfer of Care Note  Patient: Nathaniel Jennings  Procedure(s) Performed: Procedure(s) (LRB): BIOPSY TRANSRECTAL ULTRASONIC PROSTATE (TUBP) (N/A)  Patient Location: PACU  Anesthesia Type: MAC  Level of Consciousness: awake, alert , oriented and patient cooperative  Airway & Oxygen Therapy: Patient Spontanous Breathing and Patient connected to face mask oxygen  Post-op Assessment: Report given to PACU RN and Post -op Vital signs reviewed and stable  Post vital signs: Reviewed and stable  Complications: No apparent anesthesia complications

## 2015-07-05 ENCOUNTER — Encounter (HOSPITAL_BASED_OUTPATIENT_CLINIC_OR_DEPARTMENT_OTHER): Payer: Self-pay | Admitting: Urology

## 2015-11-15 ENCOUNTER — Other Ambulatory Visit: Payer: Self-pay | Admitting: Urology

## 2015-11-16 ENCOUNTER — Encounter (HOSPITAL_BASED_OUTPATIENT_CLINIC_OR_DEPARTMENT_OTHER): Payer: Self-pay | Admitting: *Deleted

## 2015-11-16 NOTE — Progress Notes (Signed)
NPO AFTER MN.  ARRIVE AT 1000.  NEEDS ISTAT 8 .  CURRENT EKG IN CHART AND EPIC.  WILL TAKE TOPROL AND PRILOSEC AM DOS W/ SIPS OF WATER AND DO FLEET ENEMA.

## 2015-11-22 ENCOUNTER — Ambulatory Visit (HOSPITAL_BASED_OUTPATIENT_CLINIC_OR_DEPARTMENT_OTHER): Payer: BLUE CROSS/BLUE SHIELD | Admitting: Anesthesiology

## 2015-11-22 ENCOUNTER — Encounter (HOSPITAL_BASED_OUTPATIENT_CLINIC_OR_DEPARTMENT_OTHER): Payer: Self-pay | Admitting: *Deleted

## 2015-11-22 ENCOUNTER — Ambulatory Visit (HOSPITAL_BASED_OUTPATIENT_CLINIC_OR_DEPARTMENT_OTHER)
Admission: RE | Admit: 2015-11-22 | Discharge: 2015-11-22 | Disposition: A | Discharge status: Home / Self Care | Payer: BLUE CROSS/BLUE SHIELD | Source: Ambulatory Visit | Attending: Urology | Admitting: Urology

## 2015-11-22 ENCOUNTER — Encounter (HOSPITAL_BASED_OUTPATIENT_CLINIC_OR_DEPARTMENT_OTHER)
Admission: RE | Disposition: A | Discharge status: Home / Self Care | Payer: Self-pay | Source: Ambulatory Visit | Attending: Urology

## 2015-11-22 DIAGNOSIS — Z79899 Other long term (current) drug therapy: Secondary | ICD-10-CM | POA: Diagnosis not present

## 2015-11-22 DIAGNOSIS — E785 Hyperlipidemia, unspecified: Secondary | ICD-10-CM | POA: Insufficient documentation

## 2015-11-22 DIAGNOSIS — I10 Essential (primary) hypertension: Secondary | ICD-10-CM | POA: Diagnosis not present

## 2015-11-22 DIAGNOSIS — Z7982 Long term (current) use of aspirin: Secondary | ICD-10-CM | POA: Diagnosis not present

## 2015-11-22 DIAGNOSIS — N400 Enlarged prostate without lower urinary tract symptoms: Secondary | ICD-10-CM | POA: Diagnosis not present

## 2015-11-22 DIAGNOSIS — R972 Elevated prostate specific antigen [PSA]: Secondary | ICD-10-CM | POA: Diagnosis not present

## 2015-11-22 DIAGNOSIS — Z87891 Personal history of nicotine dependence: Secondary | ICD-10-CM | POA: Diagnosis not present

## 2015-11-22 DIAGNOSIS — K219 Gastro-esophageal reflux disease without esophagitis: Secondary | ICD-10-CM | POA: Insufficient documentation

## 2015-11-22 DIAGNOSIS — Z85820 Personal history of malignant melanoma of skin: Secondary | ICD-10-CM | POA: Insufficient documentation

## 2015-11-22 HISTORY — DX: Benign prostatic hyperplasia without lower urinary tract symptoms: N40.0

## 2015-11-22 HISTORY — PX: PROSTATE BIOPSY: SHX241

## 2015-11-22 LAB — POCT I-STAT, CHEM 8
BUN: 19 mg/dL (ref 6–20)
CHLORIDE: 103 mmol/L (ref 101–111)
Calcium, Ion: 1.24 mmol/L — ABNORMAL HIGH (ref 1.12–1.23)
Creatinine, Ser: 1 mg/dL (ref 0.61–1.24)
Glucose, Bld: 98 mg/dL (ref 65–99)
HEMATOCRIT: 47 % (ref 39.0–52.0)
Hemoglobin: 16 g/dL (ref 13.0–17.0)
Potassium: 4.2 mmol/L (ref 3.5–5.1)
SODIUM: 142 mmol/L (ref 135–145)
TCO2: 28 mmol/L (ref 0–100)

## 2015-11-22 SURGERY — BIOPSY, PROSTATE, RECTAL APPROACH, WITH US GUIDANCE
Anesthesia: Monitor Anesthesia Care | Site: Rectum

## 2015-11-22 MED ORDER — ONDANSETRON HCL 4 MG/2ML IJ SOLN
INTRAMUSCULAR | Status: DC | PRN
Start: 2015-11-22 — End: 2015-11-22
  Administered 2015-11-22: 4 mg via INTRAVENOUS

## 2015-11-22 MED ORDER — DEXTROSE 5 % IV SOLN
5.0000 mg/kg | INTRAVENOUS | Status: AC
Start: 2015-11-22 — End: 2015-11-22
  Administered 2015-11-22: 350 mg via INTRAVENOUS
  Filled 2015-11-22 (×2): qty 8.75

## 2015-11-22 MED ORDER — LIDOCAINE HCL (CARDIAC) 20 MG/ML IV SOLN
INTRAVENOUS | Status: AC
  Filled 2015-11-22: qty 5

## 2015-11-22 MED ORDER — LIDOCAINE HCL 2 % IJ SOLN
INTRAMUSCULAR | Status: DC | PRN
  Administered 2015-11-22: 10 mL

## 2015-11-22 MED ORDER — FENTANYL CITRATE (PF) 100 MCG/2ML IJ SOLN
INTRAMUSCULAR | Status: DC | PRN
  Administered 2015-11-22: 100 ug via INTRAVENOUS

## 2015-11-22 MED ORDER — LACTATED RINGERS IV SOLN
INTRAVENOUS | Status: DC
  Administered 2015-11-22 (×2): via INTRAVENOUS
  Filled 2015-11-22: qty 1000

## 2015-11-22 MED ORDER — PROPOFOL 500 MG/50ML IV EMUL
INTRAVENOUS | Status: DC | PRN
  Administered 2015-11-22: 200 ug/kg/min via INTRAVENOUS

## 2015-11-22 MED ORDER — FENTANYL CITRATE (PF) 100 MCG/2ML IJ SOLN
INTRAMUSCULAR | Status: AC
  Filled 2015-11-22: qty 2

## 2015-11-22 MED ORDER — MIDAZOLAM HCL 5 MG/5ML IJ SOLN
INTRAMUSCULAR | Status: DC | PRN
  Administered 2015-11-22: 2 mg via INTRAVENOUS

## 2015-11-22 MED ORDER — LIDOCAINE HCL (CARDIAC) 20 MG/ML IV SOLN
INTRAVENOUS | Status: DC | PRN
  Administered 2015-11-22: 50 mg via INTRAVENOUS

## 2015-11-22 MED ORDER — PROPOFOL 10 MG/ML IV BOLUS
INTRAVENOUS | Status: AC
  Filled 2015-11-22: qty 40

## 2015-11-22 MED ORDER — MIDAZOLAM HCL 2 MG/2ML IJ SOLN
INTRAMUSCULAR | Status: AC
  Filled 2015-11-22: qty 2

## 2015-11-22 MED ORDER — ONDANSETRON HCL 4 MG/2ML IJ SOLN
INTRAMUSCULAR | Status: AC
  Filled 2015-11-22: qty 2

## 2015-11-22 MED ORDER — GENTAMICIN IN SALINE 1.6-0.9 MG/ML-% IV SOLN
80.0000 mg | INTRAVENOUS | Status: DC
  Filled 2015-11-22: qty 50

## 2015-11-22 MED ORDER — TRAMADOL HCL 50 MG PO TABS: 50.0000 mg | ORAL_TABLET | Freq: Four times a day (QID) | ORAL | Status: DC | PRN

## 2015-11-22 SURGICAL SUPPLY — 9 items
DRSG TELFA 3X8 NADH (GAUZE/BANDAGES/DRESSINGS) ×2 IMPLANT
GLOVE BIO SURGEON STRL SZ8 (GLOVE) IMPLANT
KIT ROOM TURNOVER WOR (KITS) ×2 IMPLANT
NEEDLE SPNL 22GX7 QUINCKE BK (NEEDLE) IMPLANT
SURGILUBE 2OZ TUBE FLIPTOP (MISCELLANEOUS) ×2 IMPLANT
SYR CONTROL 10ML LL (SYRINGE) IMPLANT
TOWEL OR 17X24 6PK STRL BLUE (TOWEL DISPOSABLE) ×2 IMPLANT
TUBE CONNECTING 12X1/4 (SUCTIONS) IMPLANT
UNDERPAD 30X30 INCONTINENT (UNDERPADS AND DIAPERS) ×4 IMPLANT

## 2015-11-22 NOTE — Op Note (Signed)
Pre op diagnosis: Elevated PSA  Post op diagnosis: Elevated PSA        Procedure: Transrectal ultrasound of the prostate, Ultrasound Guided Prostate needle biopsy.   Attending: Nicolette Bang  Anesthesia: General  EBL: minimal  Antibiotics: Gentamicin  Drains: none  Findings: 48.5g prostate. Prostate measurements: width 4.85cm, height 3.67cm, length 5.21cm 2cm hypoechoic lesion in right mid gland   Indications: Pt is a 60yo male with a history of elevated PSA and abnormal DRE. After discussing workup he has elected to proceed with prostate biopsy  Procedure in detail: Prior to procedure consent was obtained. The patient was brought to the OR and a brief timeout was done to ensure correct patient, correct procedure, and correct site. General anesthesia was administered and patient was placed in the dorsal lithotomy position. The prostate size was estimated to be 50g by digital rectal exam. The 10 MHz transrectal ultrasound probe was placed into the rectum. Prostate width measured 4.85cm, height of 3.67cm and length of 5.21cm . Prostate volume was measured to be 48.5 cc. The biopsy cores were obtained using direct, real-time ultrasound guidance utilizing a standard 12core pattern with one core from the right apex lateral using real time ultrasound guidance to direct the biopsy core being taken from this location, right apex medial using real time ultrasound guidance to direct the biopsy core being taken from this location, left apex lateral using real time ultrasound guidance to direct the biopsy core being taken from this location, left apex medial using real time ultrasound guidance to direct the biopsy core being taken from this location, right mid lateral using real time ultrasound guidance to direct the biopsy core being taken from this location, right mid medial using real time ultrasound guidance to direct the biopsy core being taken from this location, left mid lateral using real time  ultrasound guidance to direct the biopsy core being taken from this location, left mid medial using real time ultrasound guidance to direct the biopsy core being taken from this location, right base lateral using real time ultrasound guidance to direct the biopsy core being taken from this location, right base medial using real time ultrasound guidance to direct the biopsy core being taken from this location, left base lateral using real time ultrasound guidance to direct the biopsy core being taken from this location and left base medial of the prostate using real time ultrasound guidance to direct the biopsy core being taken from this location. The biopsy cores were placed in buffered formalin and sent to pathology. This then concluded the procedure which was well tolerated by the patient.   Complications:. None.   Condition: Stable, extubated, transferred to PACU  Plan: Patient is to be discharged home. They are to followup in 1 week for pathology discussion

## 2015-11-22 NOTE — Anesthesia Procedure Notes (Signed)
Procedure Name: MAC Date/Time: 11/22/2015 11:48 AM Performed by: Wanita Chamberlain Pre-anesthesia Checklist: Patient identified, Timeout performed, Emergency Drugs available, Suction available and Patient being monitored Patient Re-evaluated:Patient Re-evaluated prior to inductionOxygen Delivery Method: Nasal cannula Preoxygenation: Pre-oxygenation with 100% oxygen Intubation Type: IV induction Placement Confirmation: positive ETCO2

## 2015-11-22 NOTE — Transfer of Care (Signed)
Immediate Anesthesia Transfer of Care Note  Patient: Nathaniel Jennings  Procedure(s) Performed: Procedure(s): BIOPSY TRANSRECTAL ULTRASONIC PROSTATE (TUBP) (N/A)  Patient Location: PACU  Anesthesia Type:MAC  Level of Consciousness: awake, alert , oriented and patient cooperative  Airway & Oxygen Therapy: Patient Spontanous Breathing and Patient connected to nasal cannula oxygen  Post-op Assessment: Report given to RN and Post -op Vital signs reviewed and stable  Post vital signs: Reviewed and stable  Last Vitals:  Filed Vitals:   11/22/15 1002  BP: 149/80  Pulse: 64  Temp: 36.5 C  Resp: 16    Complications: No apparent anesthesia complications

## 2015-11-22 NOTE — Anesthesia Preprocedure Evaluation (Addendum)
Anesthesia Evaluation  Patient identified by MRN, date of birth, ID band Patient awake    Reviewed: Allergy & Precautions, NPO status , Patient's Chart, lab work & pertinent test results  Airway Mallampati: II  TM Distance: >3 FB Neck ROM: Full    Dental no notable dental hx. (+) Lower Dentures, Upper Dentures,    Pulmonary neg pulmonary ROS, former smoker,    Pulmonary exam normal breath sounds clear to auscultation       Cardiovascular Exercise Tolerance: Good hypertension, Pt. on medications and Pt. on home beta blockers Normal cardiovascular exam Rhythm:Regular Rate:Normal     Neuro/Psych negative neurological ROS  negative psych ROS   GI/Hepatic negative GI ROS, Neg liver ROS, GERD  Controlled,  Endo/Other  negative endocrine ROS  Renal/GU negative Renal ROS  negative genitourinary   Musculoskeletal negative musculoskeletal ROS (+)   Abdominal   Peds negative pediatric ROS (+)  Hematology negative hematology ROS (+)   Anesthesia Other Findings   Reproductive/Obstetrics negative OB ROS                           Anesthesia Physical Anesthesia Plan  ASA: II  Anesthesia Plan: MAC   Post-op Pain Management:    Induction: Intravenous  Airway Management Planned: Simple Face Mask  Additional Equipment:   Intra-op Plan:   Post-operative Plan: Extubation in OR  Informed Consent: I have reviewed the patients History and Physical, chart, labs and discussed the procedure including the risks, benefits and alternatives for the proposed anesthesia with the patient or authorized representative who has indicated his/her understanding and acceptance.   Dental advisory given  Plan Discussed with: CRNA, Surgeon and Anesthesiologist  Anesthesia Plan Comments:        Anesthesia Quick Evaluation

## 2015-11-22 NOTE — Brief Op Note (Signed)
11/22/2015  12:04 PM  PATIENT:  Nathaniel Jennings  60 y.o. male  PRE-OPERATIVE DIAGNOSIS:  ELEVATED PROSTATIC SPECIFIC ANTIGEN  POST-OPERATIVE DIAGNOSIS:  ELEVATED PROSTATIC SPECIFIC ANTIGEN  PROCEDURE:  Procedure(s): BIOPSY TRANSRECTAL ULTRASONIC PROSTATE (TUBP) (N/A)  SURGEON:  Surgeon(s) and Role:    * Cleon Gustin, MD - Primary  PHYSICIAN ASSISTANT:   ASSISTANTS: none   ANESTHESIA:   local and MAC  EBL:  Total I/O In: 350 [I.V.:350] Out: -   BLOOD ADMINISTERED:none  DRAINS: none   LOCAL MEDICATIONS USED:  LIDOCAINE   SPECIMEN:  Source of Specimen:  prostate biopsies  DISPOSITION OF SPECIMEN:  PATHOLOGY  COUNTS:  YES  TOURNIQUET:  * No tourniquets in log *  DICTATION: .Note written in EPIC  PLAN OF CARE: Discharge to home after PACU  PATIENT DISPOSITION:  PACU - hemodynamically stable.   Delay start of Pharmacological VTE agent (>24hrs) due to surgical blood loss or risk of bleeding: not applicable

## 2015-11-22 NOTE — Discharge Instructions (Signed)
Transrectal Ultrasound-Guided Biopsy A transrectal ultrasound-guided biopsy is a procedure to remove samples of tissue from your prostate using ultrasound images to guide the procedure. The procedure is usually done to evaluate the prostate gland of men who have an elevated prostate-specific antigen (PSA). PSA is a blood test to screen for prostate cancer. The biopsy samples are taken to check for prostate cancer.  LET [facility] CARE PROVIDER KNOW ABOUT:  Any allergies you have.  All medicines you are taking, including vitamins, herbs, eye drops, creams, and over-the-counter medicines.  Previous problems you or members of your family have had with the use of anesthetics.  Any blood disorders you have.  Previous surgeries you have had.  Medical conditions you have. RISKS AND COMPLICATIONS Generally, this is a safe procedure. However, as with any procedure, problems can occur. Possible problems include:  Infection of your prostate.  Bleeding from your rectum or blood in your urine.  Difficulty urinating.  Nerve damage (this is usually temporary).  Damage to surrounding structures such as blood vessels, organs, and muscles, which would require other procedures. BEFORE THE PROCEDURE  Do not eat or drink anything after midnight on the night before the procedure or as directed by your health care provider.  Take medicines only as directed by your health care provider.  Your health care provider may have you stop taking certain medicines 5-7 days before the procedure.  You will be given an enema before the procedure. During an enema, a liquid is injected into your rectum to clear out waste.  You may have lab tests the day of your procedure.   Plan to have someone take you home after the procedure. PROCEDURE   You will be given medicine to help you relax (sedative) before the procedure. An IV tube will be inserted into one of your veins and used to give fluids and  medicine.  You will be given antibiotic medicine to reduce the risk of an infection.  You will be placed on your side for the procedure.  A probe with lubricated gel will be placed into your rectum, and images will be taken of your prostate and surrounding structures.  Numbing medicine will be injected into the prostate before the biopsy samples are taken.  A biopsy needle will then be inserted and guided to your prostate with the use of the ultrasound images.  Samples of prostate tissue will be taken, and the needle will then be removed.  The biopsy samples will be sent to a lab to be analyzed. Results are usually back in 2-3 days. AFTER THE PROCEDURE  You will be taken to a recovery area where you will be monitored.  You may have some discomfort in the rectal area. You will be given pain medicines to control this.  You may be allowed to go home the same day, or you may need to stay in the hospital overnight.   This information is not intended to replace advice given to you by your health care provider. Make sure you discuss any questions you have with your health care provider.   Document Released: 02/23/2014 Document Revised: 10/30/2014 Document Reviewed: 02/23/2014 Elsevier Interactive Patient Education 2016 [place] Instructions  Activity: Get plenty of rest for the remainder of the day. A responsible adult should stay with you for 24 hours following the procedure.  For the next 24 hours, DO NOT: -Drive a car -Paediatric nurse -Drink alcoholic beverages -Take any medication  unless instructed by your physician -Make any legal decisions or sign important papers.  Meals: Start with liquid foods such as gelatin or soup. Progress to regular foods as tolerated. Avoid greasy, spicy, heavy foods. If nausea and/or vomiting occur, drink only clear liquids until the nausea and/or vomiting subsides. Call your physician if vomiting  continues.  Special Instructions/Symptoms: Your throat may feel dry or sore from the anesthesia or the breathing tube placed in your throat during surgery. If this causes discomfort, gargle with warm salt water. The discomfort should disappear within 24 hours.  If you had a scopolamine patch placed behind your ear for the management of post- operative nausea and/or vomiting:  1. The medication in the patch is effective for 72 hours, after which it should be removed.  Wrap patch in a tissue and discard in the trash. Wash hands thoroughly with soap and water. 2. You may remove the patch earlier than 72 hours if you experience unpleasant side effects which may include dry mouth, dizziness or visual disturbances. 3. Avoid touching the patch. Wash your hands with soap and water after contact with the patch.

## 2015-11-22 NOTE — H&P (Signed)
Urology Admission H&P  Chief Complaint: elevated PSA  History of Present Illness: Mr Nathaniel Jennings is a 60yo with a hx of elevated PSA here for prostate biopsy. He has a hx of a negative prostate biopsy for elevated PSA. His most recent PSA is 7.39 with a free PSA of 8%. He denies any significant LUTS  Past Medical History  Diagnosis Date  . Hypertension   . History of melanoma excision     07/ 2011  s/p  wide excision left back--- per pathology-- negative maligant --  mild melanocytic atypia  . Elevated PSA   . History of palpitations     cardiologist --  dr Shelva Majestic (last note in epic 2014  . Dyslipidemia   . Wears glasses   . Full dentures   . BPH (benign prostatic hyperplasia)    Past Surgical History  Procedure Laterality Date  . Wide excision melanoma left back  05-02-2010  . Transthoracic echocardiogram  11-19-2012    normal echo/  trivial TR  . Colonoscopy  2009  . Prostate biopsy N/A 07/02/2015    Procedure: BIOPSY TRANSRECTAL ULTRASONIC PROSTATE (TUBP);  Surgeon: Lowella Bandy, MD;  Location: [facility];  Service: Urology;  Laterality: N/A;    Home Medications:  Prescriptions prior to admission  Medication Sig Dispense Refill Last Dose  . aspirin EC 81 MG tablet Take 81 mg by mouth daily.   Past Week at Unknown time  . Krill Oil 1000 MG CAPS Take 1 capsule by mouth daily.   Past Month at Unknown time  . metoprolol succinate (TOPROL-XL) 50 MG 24 hr tablet Take 75 mg by mouth every morning. Take with or immediately following a meal.   11/22/2015 at 07000  . omeprazole (PRILOSEC) 20 MG capsule Take 20 mg by mouth every morning.    11/21/2015 at Unknown time   Allergies: No Known Allergies  History reviewed. No pertinent family history. Social History:  reports that he quit smoking about 4 years ago. His smoking use included Cigarettes. He has a 84 pack-year smoking history. His smokeless tobacco use includes Snuff. He reports that he drinks about 14.4 oz of alcohol  per week. His drug history is not on file.  Review of Systems  All other systems reviewed and are negative.   Physical Exam:  Vital signs in last 24 hours: Temp:  [97.7 F (36.5 C)] 97.7 F (36.5 C) (01/30 1002) Pulse Rate:  [64] 64 (01/30 1002) Resp:  [16] 16 (01/30 1002) BP: (149)/(80) 149/80 mmHg (01/30 1002) SpO2:  [100 %] 100 % (01/30 1002) Weight:  [78.472 kg (173 lb)] 78.472 kg (173 lb) (01/30 1002) Physical Exam  Constitutional: He is oriented to person, place, and time. He appears well-developed and well-nourished.  HENT:  Head: Normocephalic and atraumatic.  Eyes: EOM are normal. Pupils are equal, round, and reactive to light.  Neck: Normal range of motion. No thyromegaly present.  Cardiovascular: Normal rate and regular rhythm.   Respiratory: Effort normal. No respiratory distress.  GI: Soft. He exhibits no distension.  Musculoskeletal: Normal range of motion.  Neurological: He is alert and oriented to person, place, and time.  Skin: Skin is warm and dry.  Psychiatric: He has a normal mood and affect. His behavior is normal. Judgment and thought content normal.    Laboratory Data:  Results for orders placed or performed during the hospital encounter of 11/22/15 (from the past 24 hour(s))  I-STAT, chem 8     Status: Abnormal  Collection Time: 11/22/15 11:04 AM  Result Value Ref Range   Sodium 142 135 - 145 mmol/L   Potassium 4.2 3.5 - 5.1 mmol/L   Chloride 103 101 - 111 mmol/L   BUN 19 6 - 20 mg/dL   Creatinine, Ser 1.00 0.61 - 1.24 mg/dL   Glucose, Bld 98 65 - 99 mg/dL   Calcium, Ion 1.24 (H) 1.12 - 1.23 mmol/L   TCO2 28 0 - 100 mmol/L   Hemoglobin 16.0 13.0 - 17.0 g/dL   HCT 47.0 39.0 - 52.0 %   No results found for this or any previous visit (from the past 240 hour(s)). Creatinine:  Recent Labs  11/22/15 1104  CREATININE 1.00   Baseline Creatinine: 1  Impression/Assessment:  59yo with elevated PSA  Plan:  The risks/benefits/alternatives to  prostate biopsy was explained to the patient and he understands and wishes to proceed with surgery  MCKENZIE, PATRICK L 11/22/2015, 11:29 AM

## 2015-11-22 NOTE — Anesthesia Postprocedure Evaluation (Signed)
Anesthesia Post Note  Patient: Nathaniel Jennings  Procedure(s) Performed: Procedure(s) (LRB): BIOPSY TRANSRECTAL ULTRASONIC PROSTATE (TUBP) (N/A)  Patient location during evaluation: PACU Anesthesia Type: MAC Level of consciousness: awake and alert Pain management: pain level controlled Vital Signs Assessment: post-procedure vital signs reviewed and stable Respiratory status: spontaneous breathing, nonlabored ventilation, respiratory function stable and patient connected to nasal cannula oxygen Cardiovascular status: blood pressure returned to baseline and stable Postop Assessment: no signs of nausea or vomiting Anesthetic complications: no    Last Vitals:  Filed Vitals:   11/22/15 1220 11/22/15 1230  BP:  112/79  Pulse: 67 73  Temp:    Resp: 18 17    Last Pain: There were no vitals filed for this visit.               Epiphany Seltzer S

## 2015-11-23 ENCOUNTER — Encounter (HOSPITAL_BASED_OUTPATIENT_CLINIC_OR_DEPARTMENT_OTHER): Payer: Self-pay | Admitting: Urology

## 2016-02-09 DIAGNOSIS — D235 Other benign neoplasm of skin of trunk: Secondary | ICD-10-CM | POA: Diagnosis not present

## 2016-02-09 DIAGNOSIS — L821 Other seborrheic keratosis: Secondary | ICD-10-CM | POA: Diagnosis not present

## 2016-02-09 DIAGNOSIS — L812 Freckles: Secondary | ICD-10-CM | POA: Diagnosis not present

## 2016-02-09 DIAGNOSIS — Z85820 Personal history of malignant melanoma of skin: Secondary | ICD-10-CM | POA: Diagnosis not present

## 2016-03-24 DIAGNOSIS — F17200 Nicotine dependence, unspecified, uncomplicated: Secondary | ICD-10-CM | POA: Diagnosis not present

## 2016-03-24 DIAGNOSIS — Z0000 Encounter for general adult medical examination without abnormal findings: Secondary | ICD-10-CM | POA: Diagnosis not present

## 2016-03-24 DIAGNOSIS — K219 Gastro-esophageal reflux disease without esophagitis: Secondary | ICD-10-CM | POA: Diagnosis not present

## 2016-03-24 DIAGNOSIS — I10 Essential (primary) hypertension: Secondary | ICD-10-CM | POA: Diagnosis not present

## 2016-03-24 DIAGNOSIS — Z1389 Encounter for screening for other disorder: Secondary | ICD-10-CM | POA: Diagnosis not present

## 2016-05-29 DIAGNOSIS — R972 Elevated prostate specific antigen [PSA]: Secondary | ICD-10-CM | POA: Diagnosis not present

## 2016-06-05 DIAGNOSIS — R351 Nocturia: Secondary | ICD-10-CM | POA: Diagnosis not present

## 2016-06-05 DIAGNOSIS — N401 Enlarged prostate with lower urinary tract symptoms: Secondary | ICD-10-CM | POA: Diagnosis not present

## 2016-06-05 DIAGNOSIS — R972 Elevated prostate specific antigen [PSA]: Secondary | ICD-10-CM | POA: Diagnosis not present

## 2016-07-25 DIAGNOSIS — Z23 Encounter for immunization: Secondary | ICD-10-CM | POA: Diagnosis not present

## 2016-08-21 DIAGNOSIS — D225 Melanocytic nevi of trunk: Secondary | ICD-10-CM | POA: Diagnosis not present

## 2016-09-29 DIAGNOSIS — R002 Palpitations: Secondary | ICD-10-CM | POA: Diagnosis not present

## 2016-09-29 DIAGNOSIS — K219 Gastro-esophageal reflux disease without esophagitis: Secondary | ICD-10-CM | POA: Diagnosis not present

## 2016-09-29 DIAGNOSIS — Z23 Encounter for immunization: Secondary | ICD-10-CM | POA: Diagnosis not present

## 2016-09-29 DIAGNOSIS — E782 Mixed hyperlipidemia: Secondary | ICD-10-CM | POA: Diagnosis not present

## 2016-09-29 DIAGNOSIS — I10 Essential (primary) hypertension: Secondary | ICD-10-CM | POA: Diagnosis not present

## 2016-11-02 DIAGNOSIS — R748 Abnormal levels of other serum enzymes: Secondary | ICD-10-CM | POA: Diagnosis not present

## 2016-12-04 DIAGNOSIS — R972 Elevated prostate specific antigen [PSA]: Secondary | ICD-10-CM | POA: Diagnosis not present

## 2016-12-08 DIAGNOSIS — H11153 Pinguecula, bilateral: Secondary | ICD-10-CM | POA: Diagnosis not present

## 2016-12-29 DIAGNOSIS — N401 Enlarged prostate with lower urinary tract symptoms: Secondary | ICD-10-CM | POA: Diagnosis not present

## 2016-12-29 DIAGNOSIS — R972 Elevated prostate specific antigen [PSA]: Secondary | ICD-10-CM | POA: Diagnosis not present

## 2016-12-29 DIAGNOSIS — R351 Nocturia: Secondary | ICD-10-CM | POA: Diagnosis not present

## 2017-02-07 DIAGNOSIS — L821 Other seborrheic keratosis: Secondary | ICD-10-CM | POA: Diagnosis not present

## 2017-02-07 DIAGNOSIS — Z85820 Personal history of malignant melanoma of skin: Secondary | ICD-10-CM | POA: Diagnosis not present

## 2017-02-07 DIAGNOSIS — L814 Other melanin hyperpigmentation: Secondary | ICD-10-CM | POA: Diagnosis not present

## 2017-02-07 DIAGNOSIS — L570 Actinic keratosis: Secondary | ICD-10-CM | POA: Diagnosis not present

## 2017-04-06 DIAGNOSIS — Z79899 Other long term (current) drug therapy: Secondary | ICD-10-CM | POA: Diagnosis not present

## 2017-04-06 DIAGNOSIS — K219 Gastro-esophageal reflux disease without esophagitis: Secondary | ICD-10-CM | POA: Diagnosis not present

## 2017-04-06 DIAGNOSIS — E782 Mixed hyperlipidemia: Secondary | ICD-10-CM | POA: Diagnosis not present

## 2017-04-06 DIAGNOSIS — I10 Essential (primary) hypertension: Secondary | ICD-10-CM | POA: Diagnosis not present

## 2017-04-06 DIAGNOSIS — Z0000 Encounter for general adult medical examination without abnormal findings: Secondary | ICD-10-CM | POA: Diagnosis not present

## 2017-04-06 DIAGNOSIS — Z1389 Encounter for screening for other disorder: Secondary | ICD-10-CM | POA: Diagnosis not present

## 2017-06-15 DIAGNOSIS — H35371 Puckering of macula, right eye: Secondary | ICD-10-CM | POA: Diagnosis not present

## 2017-07-04 DIAGNOSIS — R972 Elevated prostate specific antigen [PSA]: Secondary | ICD-10-CM | POA: Diagnosis not present

## 2017-07-10 DIAGNOSIS — E663 Overweight: Secondary | ICD-10-CM | POA: Diagnosis not present

## 2017-07-10 DIAGNOSIS — J209 Acute bronchitis, unspecified: Secondary | ICD-10-CM | POA: Diagnosis not present

## 2017-07-10 DIAGNOSIS — Z6827 Body mass index (BMI) 27.0-27.9, adult: Secondary | ICD-10-CM | POA: Diagnosis not present

## 2017-07-16 DIAGNOSIS — R972 Elevated prostate specific antigen [PSA]: Secondary | ICD-10-CM | POA: Diagnosis not present

## 2017-07-16 DIAGNOSIS — R351 Nocturia: Secondary | ICD-10-CM | POA: Diagnosis not present

## 2017-07-16 DIAGNOSIS — N401 Enlarged prostate with lower urinary tract symptoms: Secondary | ICD-10-CM | POA: Diagnosis not present

## 2017-08-15 DIAGNOSIS — Z23 Encounter for immunization: Secondary | ICD-10-CM | POA: Diagnosis not present

## 2017-08-20 DIAGNOSIS — Z85820 Personal history of malignant melanoma of skin: Secondary | ICD-10-CM | POA: Diagnosis not present

## 2017-08-20 DIAGNOSIS — L821 Other seborrheic keratosis: Secondary | ICD-10-CM | POA: Diagnosis not present

## 2017-08-20 DIAGNOSIS — D229 Melanocytic nevi, unspecified: Secondary | ICD-10-CM | POA: Diagnosis not present

## 2017-08-20 DIAGNOSIS — L814 Other melanin hyperpigmentation: Secondary | ICD-10-CM | POA: Diagnosis not present

## 2017-10-30 DIAGNOSIS — I10 Essential (primary) hypertension: Secondary | ICD-10-CM | POA: Diagnosis not present

## 2017-10-30 DIAGNOSIS — J019 Acute sinusitis, unspecified: Secondary | ICD-10-CM | POA: Diagnosis not present

## 2017-10-30 DIAGNOSIS — R0789 Other chest pain: Secondary | ICD-10-CM | POA: Diagnosis not present

## 2017-10-30 DIAGNOSIS — K219 Gastro-esophageal reflux disease without esophagitis: Secondary | ICD-10-CM | POA: Diagnosis not present

## 2017-10-30 DIAGNOSIS — E782 Mixed hyperlipidemia: Secondary | ICD-10-CM | POA: Diagnosis not present

## 2017-11-07 NOTE — Progress Notes (Signed)
Cardiology Office Note   Date:  11/08/2017   ID:  EMRYS MCKAMIE, DOB [birth year], MRN [id number]  PCP:  Cyndi Bender, PA-C  Cardiologist:   No primary care provider on file. Referring:  Cyndi Bender, PA-C  Chief Complaint  Patient presents with  . New Patient (Initial Visit)      History of Present Illness: Nathaniel Jennings is a 62 y.o. male who is referred by Cyndi Bender, PA-C for evaluation of chest pain.  He was previously seen in 2014 by Dr. Claiborne Billings for management of palpitations and dyslipidemia.  He did have an echocardiogram at that time that was essentially normal.  He reports that he has been having chest discomfort probably for a month to 6 weeks.  It only happens when he is relaxed.  It is a midsternal discomfort.  There is some pressure.  It might go away with burping.  Might be followed by sharp pain.  It can be 5 out of 10 in intensity and lasted for 45 minutes.  He does not describe radiation to his neck or to his arms.  He does not describe associated nausea vomiting or diaphoresis.  He does not get any shortness of breath.  He has palpitations but not as severe as he had in the past.  He is active and physical on his job in [employer] and does not bring on the symptoms with this.  He has not had any prior workup for coronary artery disease.  Past Medical History:  Diagnosis Date  . BPH (benign prostatic hyperplasia)   . Dyslipidemia   . Elevated PSA   . Full dentures   . History of melanoma excision    07/ 2011  s/p  wide excision left back--- per pathology-- negative maligant --  mild melanocytic atypia  . History of palpitations    cardiologist --  dr Shelva Majestic (last note in epic 2014  . Hypertension     Past Surgical History:  Procedure Laterality Date  . COLONOSCOPY  2009  . PROSTATE BIOPSY N/A 07/02/2015   Procedure: BIOPSY TRANSRECTAL ULTRASONIC PROSTATE (TUBP);  Surgeon: Lowella Bandy, MD;  Location: [facility];  Service: Urology;   Laterality: N/A;  . PROSTATE BIOPSY N/A 11/22/2015   Procedure: BIOPSY TRANSRECTAL ULTRASONIC PROSTATE (TUBP);  Surgeon: Cleon Gustin, MD;  Location: [facility];  Service: Urology;  Laterality: N/A;  . TRANSTHORACIC ECHOCARDIOGRAM  11-19-2012   normal echo/  trivial TR  . WIDE EXCISION MELANOMA LEFT BACK  05-02-2010     Current Outpatient Medications  Medication Sig Dispense Refill  . aspirin EC 81 MG tablet Take 81 mg by mouth daily.    . finasteride (PROSCAR) 5 MG tablet Take 5 mg by mouth daily.  5  . Krill Oil 1000 MG CAPS Take 1 capsule by mouth daily.    . metoprolol succinate (TOPROL-XL) 100 MG 24 hr tablet Take 1 tablet by mouth daily.    [name] omeprazole (PRILOSEC) 20 MG capsule Take 20 mg by mouth every morning.     . tamsulosin (FLOMAX) 0.4 MG CAPS capsule Take 0.4 mg by mouth at bedtime.  12  . traMADol (ULTRAM) 50 MG tablet Take 1 tablet (50 mg total) by mouth every 6 (six) hours as needed. 5 tablet 0   No current facility-administered medications for this visit.     Allergies:   Patient has no known allergies.    Social History:  The patient  reports that  he quit smoking about 6 years ago. His smoking use included cigarettes. He has a 84.00 pack-year smoking history. His smokeless tobacco use includes snuff. He reports that he drinks about 14.4 oz of alcohol per week.   Family History:  The patient's family history includes Kidney failure in his mother; Pneumonia in his father.    ROS:  Please see the history of present illness.   Otherwise, review of systems are positive for none.   All other systems are reviewed and negative.    PHYSICAL EXAM: VS:  BP (!) 145/91   Pulse 65   Ht 5' 6.5" (1.689 m)   Wt 173 lb (78.5 kg)   BMI 27.50 kg/m  , BMI Body mass index is 27.5 kg/m. GENERAL:  Well appearing HEENT:  Pupils equal round and reactive, fundi not visualized, oral mucosa unremarkable, DENTURES NECK:  No jugular venous distention, waveform  within normal limits, carotid upstroke brisk and symmetric, no bruits, no thyromegaly LYMPHATICS:  No cervical, inguinal adenopathy LUNGS:  Clear to auscultation bilaterally BACK:  No CVA tenderness CHEST:  Unremarkable HEART:  PMI not displaced or sustained,S1 and S2 within normal limits, no S3, no S4, no clicks, no rubs, NO murmurs ABD:  Flat, positive bowel sounds normal in frequency in pitch, no bruits, no rebound, no guarding, no midline pulsatile mass, no hepatomegaly, no splenomegaly EXT:  2 plus pulses throughout, no edema, no cyanosis no clubbing SKIN:  No rashes no nodules NEURO:  Cranial nerves II through XII grossly intact, motor grossly intact throughout PSYCH:  Cognitively intact, oriented to person place and time    EKG:  EKG is ordered today. The ekg ordered today demonstrates sinus rhythm, rate 65, axis within normal limits, intervals within normal limits, no acute ST-T wave changes.   Recent Labs: No results found for requested labs within last 8760 hours.    Lipid Panel    Component Value Date/Time   CHOL 186 01/28/2008 2019   TRIG 232 (H) 01/28/2008 2019   HDL 38 (L) 01/28/2008 2019   CHOLHDL 4.9 Ratio 01/28/2008 2019   VLDL 46 (H) 01/28/2008 2019   LDLCALC 102 (H) 01/28/2008 2019      Wt Readings from Last 3 Encounters:  11/08/17 173 lb (78.5 kg)  11/22/15 173 lb (78.5 kg)  07/02/15 168 lb (76.2 kg)      Other studies Reviewed: Additional studies/ records that were reviewed today include: Labs. Review of the above records demonstrates:  Please see elsewhere in the note.     ASSESSMENT AND PLAN:   CHEST PAIN: The pain is somewhat atypical.  However, he has hypertension and dyslipidemia as risk factors. I will bring the patient back for a POET (Plain Old Exercise Test). This will allow me to screen for obstructive coronary disease, risk stratify and very importantly provide a prescription for exercise.  He should hold his beta blocker the day of the  procedure.    HTN:  The blood pressure is slightly elevated but this is unusual.  He will watch this and continue the beta blocker. . No change in medications is indicated. We will continue with therapeutic lifestyle changes (TLC).  DYSLIPIDEMIA: HDL was 40 and LDL 90.  No change in therapy is indicated.  Current medicines are reviewed at length with the patient today.  The patient does not have concerns regarding medicines.  The following changes have been made:  no change  Labs/ tests ordered today include:   Orders Placed This Encounter  Procedures  . Exercise Tolerance Test  . EKG 12-Lead     Disposition:   FU with me as needed.     Signed, Minus Breeding, MD  11/08/2017 9:06 AM    [place]

## 2017-11-08 ENCOUNTER — Telehealth (HOSPITAL_COMMUNITY): Payer: Self-pay

## 2017-11-08 ENCOUNTER — Ambulatory Visit: Payer: BLUE CROSS/BLUE SHIELD | Admitting: Cardiology

## 2017-11-08 ENCOUNTER — Encounter: Payer: Self-pay | Admitting: Cardiology

## 2017-11-08 ENCOUNTER — Encounter (INDEPENDENT_AMBULATORY_CARE_PROVIDER_SITE_OTHER): Payer: Self-pay

## 2017-11-08 VITALS — BP 145/91 | HR 65 | Ht 66.5 in | Wt 173.0 lb

## 2017-11-08 DIAGNOSIS — R079 Chest pain, unspecified: Secondary | ICD-10-CM

## 2017-11-08 DIAGNOSIS — I10 Essential (primary) hypertension: Secondary | ICD-10-CM

## 2017-11-08 NOTE — Patient Instructions (Signed)
Medication Instructions:  Continue current medications  If you need a refill on your cardiac medications before your next appointment, please call your pharmacy.  Labwork: None Ordered   Testing/Procedures: Your physician has requested that you have an exercise tolerance test. For further information please visit [email or web address]. Please also follow instruction sheet, as given.   Follow-Up: Your physician wants you to follow-up in: As Needed.     Thank you for choosing [facility]!!       

## 2017-11-08 NOTE — Telephone Encounter (Signed)
Encounter complete. 

## 2017-11-13 ENCOUNTER — Ambulatory Visit (HOSPITAL_COMMUNITY)
Admission: RE | Admit: 2017-11-13 | Discharge: 2017-11-13 | Disposition: A | Discharge status: Home / Self Care | Payer: BLUE CROSS/BLUE SHIELD | Source: Ambulatory Visit | Attending: Cardiology | Admitting: Cardiology

## 2017-11-13 DIAGNOSIS — R079 Chest pain, unspecified: Secondary | ICD-10-CM | POA: Diagnosis not present

## 2017-11-14 LAB — EXERCISE TOLERANCE TEST
CHL CUP RESTING HR STRESS: 64 {beats}/min
CHL CUP STRESS STAGE 1 DBP: 95 mmHg
CHL CUP STRESS STAGE 1 SBP: 150 mmHg
CHL CUP STRESS STAGE 3 HR: 75 {beats}/min
CHL CUP STRESS STAGE 3 SPEED: 1 mph
CHL CUP STRESS STAGE 4 DBP: 89 mmHg
CHL CUP STRESS STAGE 4 GRADE: 10 %
CHL CUP STRESS STAGE 4 SBP: 166 mmHg
CHL CUP STRESS STAGE 5 DBP: 99 mmHg
CHL CUP STRESS STAGE 5 SPEED: 2.5 mph
CHL CUP STRESS STAGE 6 HR: 184 {beats}/min
CHL CUP STRESS STAGE 7 GRADE: 0 %
CHL CUP STRESS STAGE 7 HR: 151 {beats}/min
CHL CUP STRESS STAGE 7 SPEED: 0 mph
CHL CUP STRESS STAGE 8 DBP: 78 mmHg
CHL CUP STRESS STAGE 8 GRADE: 0 %
CHL CUP STRESS STAGE 8 HR: 101 {beats}/min
CHL CUP STRESS STAGE 8 SBP: 133 mmHg
CHL RATE OF PERCEIVED EXERTION: 18
CSEPED: 8 min
CSEPPMHR: 115 %
Estimated workload: 10.1 METS
Exercise duration (sec): 20 s
MPHR: 159 {beats}/min
Peak BP: 205 mmHg
Peak HR: 184 {beats}/min
Percent HR: 115 %
Stage 1 Grade: 0 %
Stage 1 HR: 75 {beats}/min
Stage 1 Speed: 0 mph
Stage 2 Grade: 0 %
Stage 2 HR: 75 {beats}/min
Stage 2 Speed: 0.8 mph
Stage 3 Grade: 0 %
Stage 4 HR: 110 {beats}/min
Stage 4 Speed: 1.7 mph
Stage 5 Grade: 12 %
Stage 5 HR: 130 {beats}/min
Stage 5 SBP: 179 mmHg
Stage 6 DBP: 101 mmHg
Stage 6 Grade: 14 %
Stage 6 SBP: 205 mmHg
Stage 6 Speed: 3.4 mph
Stage 7 DBP: 92 mmHg
Stage 7 SBP: 184 mmHg
Stage 8 Speed: 0 mph

## 2017-12-20 DIAGNOSIS — H35371 Puckering of macula, right eye: Secondary | ICD-10-CM | POA: Diagnosis not present

## 2018-01-02 DIAGNOSIS — K640 First degree hemorrhoids: Secondary | ICD-10-CM | POA: Diagnosis not present

## 2018-01-02 DIAGNOSIS — K5730 Diverticulosis of large intestine without perforation or abscess without bleeding: Secondary | ICD-10-CM | POA: Diagnosis not present

## 2018-01-02 DIAGNOSIS — Z1211 Encounter for screening for malignant neoplasm of colon: Secondary | ICD-10-CM | POA: Diagnosis not present

## 2018-01-02 DIAGNOSIS — K635 Polyp of colon: Secondary | ICD-10-CM | POA: Diagnosis not present

## 2018-01-04 DIAGNOSIS — R972 Elevated prostate specific antigen [PSA]: Secondary | ICD-10-CM | POA: Diagnosis not present

## 2018-01-08 DIAGNOSIS — K635 Polyp of colon: Secondary | ICD-10-CM | POA: Diagnosis not present

## 2018-01-22 DIAGNOSIS — R351 Nocturia: Secondary | ICD-10-CM | POA: Diagnosis not present

## 2018-01-22 DIAGNOSIS — R972 Elevated prostate specific antigen [PSA]: Secondary | ICD-10-CM | POA: Diagnosis not present

## 2018-01-22 DIAGNOSIS — N401 Enlarged prostate with lower urinary tract symptoms: Secondary | ICD-10-CM | POA: Diagnosis not present

## 2018-01-23 ENCOUNTER — Other Ambulatory Visit: Payer: Self-pay | Admitting: Urology

## 2018-01-23 DIAGNOSIS — R972 Elevated prostate specific antigen [PSA]: Secondary | ICD-10-CM

## 2018-02-05 ENCOUNTER — Ambulatory Visit
Admission: RE | Admit: 2018-02-05 | Discharge: 2018-02-05 | Disposition: A | Discharge status: Home / Self Care | Payer: BLUE CROSS/BLUE SHIELD | Source: Ambulatory Visit | Attending: Urology | Admitting: Urology

## 2018-02-05 DIAGNOSIS — R972 Elevated prostate specific antigen [PSA]: Secondary | ICD-10-CM

## 2018-02-05 MED ORDER — GADOBENATE DIMEGLUMINE 529 MG/ML IV SOLN
16.0000 mL | Freq: Once | INTRAVENOUS | Status: AC | PRN
  Administered 2018-02-05: 16 mL via INTRAVENOUS

## 2018-02-18 DIAGNOSIS — L814 Other melanin hyperpigmentation: Secondary | ICD-10-CM | POA: Diagnosis not present

## 2018-02-18 DIAGNOSIS — D229 Melanocytic nevi, unspecified: Secondary | ICD-10-CM | POA: Diagnosis not present

## 2018-02-18 DIAGNOSIS — L821 Other seborrheic keratosis: Secondary | ICD-10-CM | POA: Diagnosis not present

## 2018-02-18 DIAGNOSIS — D1801 Hemangioma of skin and subcutaneous tissue: Secondary | ICD-10-CM | POA: Diagnosis not present

## 2018-02-28 DIAGNOSIS — R351 Nocturia: Secondary | ICD-10-CM | POA: Diagnosis not present

## 2018-02-28 DIAGNOSIS — N401 Enlarged prostate with lower urinary tract symptoms: Secondary | ICD-10-CM | POA: Diagnosis not present

## 2018-02-28 DIAGNOSIS — R972 Elevated prostate specific antigen [PSA]: Secondary | ICD-10-CM | POA: Diagnosis not present

## 2018-04-16 DIAGNOSIS — R351 Nocturia: Secondary | ICD-10-CM | POA: Diagnosis not present

## 2018-04-16 DIAGNOSIS — N401 Enlarged prostate with lower urinary tract symptoms: Secondary | ICD-10-CM | POA: Diagnosis not present

## 2018-04-16 DIAGNOSIS — R3915 Urgency of urination: Secondary | ICD-10-CM | POA: Diagnosis not present

## 2018-05-10 DIAGNOSIS — Z0000 Encounter for general adult medical examination without abnormal findings: Secondary | ICD-10-CM | POA: Diagnosis not present

## 2018-05-10 DIAGNOSIS — I10 Essential (primary) hypertension: Secondary | ICD-10-CM | POA: Diagnosis not present

## 2018-05-10 DIAGNOSIS — Z1331 Encounter for screening for depression: Secondary | ICD-10-CM | POA: Diagnosis not present

## 2018-05-10 DIAGNOSIS — K219 Gastro-esophageal reflux disease without esophagitis: Secondary | ICD-10-CM | POA: Diagnosis not present

## 2018-06-20 DIAGNOSIS — H35371 Puckering of macula, right eye: Secondary | ICD-10-CM | POA: Diagnosis not present

## 2018-07-22 DIAGNOSIS — Z23 Encounter for immunization: Secondary | ICD-10-CM | POA: Diagnosis not present

## 2018-08-20 DIAGNOSIS — L819 Disorder of pigmentation, unspecified: Secondary | ICD-10-CM | POA: Diagnosis not present

## 2018-08-20 DIAGNOSIS — D229 Melanocytic nevi, unspecified: Secondary | ICD-10-CM | POA: Diagnosis not present

## 2018-08-20 DIAGNOSIS — L821 Other seborrheic keratosis: Secondary | ICD-10-CM | POA: Diagnosis not present

## 2018-08-20 DIAGNOSIS — L814 Other melanin hyperpigmentation: Secondary | ICD-10-CM | POA: Diagnosis not present

## 2018-08-29 DIAGNOSIS — N401 Enlarged prostate with lower urinary tract symptoms: Secondary | ICD-10-CM | POA: Diagnosis not present

## 2018-09-03 DIAGNOSIS — R351 Nocturia: Secondary | ICD-10-CM | POA: Diagnosis not present

## 2018-09-03 DIAGNOSIS — N401 Enlarged prostate with lower urinary tract symptoms: Secondary | ICD-10-CM | POA: Diagnosis not present

## 2018-09-03 DIAGNOSIS — R3915 Urgency of urination: Secondary | ICD-10-CM | POA: Diagnosis not present

## 2018-12-03 DIAGNOSIS — I10 Essential (primary) hypertension: Secondary | ICD-10-CM | POA: Diagnosis not present

## 2018-12-03 DIAGNOSIS — R002 Palpitations: Secondary | ICD-10-CM | POA: Diagnosis not present

## 2018-12-03 DIAGNOSIS — K219 Gastro-esophageal reflux disease without esophagitis: Secondary | ICD-10-CM | POA: Diagnosis not present

## 2018-12-03 DIAGNOSIS — Z79899 Other long term (current) drug therapy: Secondary | ICD-10-CM | POA: Diagnosis not present

## 2018-12-03 DIAGNOSIS — E782 Mixed hyperlipidemia: Secondary | ICD-10-CM | POA: Diagnosis not present

## 2018-12-24 DIAGNOSIS — H35371 Puckering of macula, right eye: Secondary | ICD-10-CM | POA: Diagnosis not present

## 2019-06-17 DIAGNOSIS — I10 Essential (primary) hypertension: Secondary | ICD-10-CM | POA: Diagnosis not present

## 2019-06-17 DIAGNOSIS — Z0000 Encounter for general adult medical examination without abnormal findings: Secondary | ICD-10-CM | POA: Diagnosis not present

## 2019-06-17 DIAGNOSIS — Z6827 Body mass index (BMI) 27.0-27.9, adult: Secondary | ICD-10-CM | POA: Diagnosis not present

## 2019-06-17 DIAGNOSIS — Z1331 Encounter for screening for depression: Secondary | ICD-10-CM | POA: Diagnosis not present

## 2019-06-17 DIAGNOSIS — K219 Gastro-esophageal reflux disease without esophagitis: Secondary | ICD-10-CM | POA: Diagnosis not present

## 2019-06-20 DIAGNOSIS — H35371 Puckering of macula, right eye: Secondary | ICD-10-CM | POA: Diagnosis not present

## 2019-06-23 DIAGNOSIS — H9071 Mixed conductive and sensorineural hearing loss, unilateral, right ear, with unrestricted hearing on the contralateral side: Secondary | ICD-10-CM | POA: Diagnosis not present

## 2019-06-23 DIAGNOSIS — H9313 Tinnitus, bilateral: Secondary | ICD-10-CM | POA: Diagnosis not present

## 2019-06-23 DIAGNOSIS — H6993 Unspecified Eustachian tube disorder, bilateral: Secondary | ICD-10-CM | POA: Diagnosis not present

## 2019-06-23 DIAGNOSIS — H906 Mixed conductive and sensorineural hearing loss, bilateral: Secondary | ICD-10-CM | POA: Diagnosis not present

## 2019-06-23 DIAGNOSIS — H6123 Impacted cerumen, bilateral: Secondary | ICD-10-CM | POA: Diagnosis not present

## 2019-06-23 DIAGNOSIS — J310 Chronic rhinitis: Secondary | ICD-10-CM | POA: Diagnosis not present

## 2019-08-21 DIAGNOSIS — D1801 Hemangioma of skin and subcutaneous tissue: Secondary | ICD-10-CM | POA: Diagnosis not present

## 2019-08-21 DIAGNOSIS — L718 Other rosacea: Secondary | ICD-10-CM | POA: Diagnosis not present

## 2019-08-21 DIAGNOSIS — D225 Melanocytic nevi of trunk: Secondary | ICD-10-CM | POA: Diagnosis not present

## 2019-08-21 DIAGNOSIS — L578 Other skin changes due to chronic exposure to nonionizing radiation: Secondary | ICD-10-CM | POA: Diagnosis not present

## 2019-08-29 DIAGNOSIS — R972 Elevated prostate specific antigen [PSA]: Secondary | ICD-10-CM | POA: Diagnosis not present

## 2019-09-04 DIAGNOSIS — R351 Nocturia: Secondary | ICD-10-CM | POA: Diagnosis not present

## 2019-09-04 DIAGNOSIS — R972 Elevated prostate specific antigen [PSA]: Secondary | ICD-10-CM | POA: Diagnosis not present

## 2019-09-04 DIAGNOSIS — N401 Enlarged prostate with lower urinary tract symptoms: Secondary | ICD-10-CM | POA: Diagnosis not present

## 2019-12-18 DIAGNOSIS — K219 Gastro-esophageal reflux disease without esophagitis: Secondary | ICD-10-CM | POA: Diagnosis not present

## 2019-12-18 DIAGNOSIS — R002 Palpitations: Secondary | ICD-10-CM | POA: Diagnosis not present

## 2019-12-18 DIAGNOSIS — I10 Essential (primary) hypertension: Secondary | ICD-10-CM | POA: Diagnosis not present

## 2019-12-18 DIAGNOSIS — E782 Mixed hyperlipidemia: Secondary | ICD-10-CM | POA: Diagnosis not present

## 2019-12-18 DIAGNOSIS — Z79899 Other long term (current) drug therapy: Secondary | ICD-10-CM | POA: Diagnosis not present

## 2020-06-17 DIAGNOSIS — Z0000 Encounter for general adult medical examination without abnormal findings: Secondary | ICD-10-CM | POA: Diagnosis not present

## 2020-06-17 DIAGNOSIS — K219 Gastro-esophageal reflux disease without esophagitis: Secondary | ICD-10-CM | POA: Diagnosis not present

## 2020-06-17 DIAGNOSIS — K429 Umbilical hernia without obstruction or gangrene: Secondary | ICD-10-CM | POA: Diagnosis not present

## 2020-06-17 DIAGNOSIS — I10 Essential (primary) hypertension: Secondary | ICD-10-CM | POA: Diagnosis not present

## 2020-07-28 DIAGNOSIS — Z23 Encounter for immunization: Secondary | ICD-10-CM | POA: Diagnosis not present

## 2020-08-23 DIAGNOSIS — L814 Other melanin hyperpigmentation: Secondary | ICD-10-CM | POA: Diagnosis not present

## 2020-08-23 DIAGNOSIS — L905 Scar conditions and fibrosis of skin: Secondary | ICD-10-CM | POA: Diagnosis not present

## 2020-08-23 DIAGNOSIS — D229 Melanocytic nevi, unspecified: Secondary | ICD-10-CM | POA: Diagnosis not present

## 2020-08-23 DIAGNOSIS — Z85820 Personal history of malignant melanoma of skin: Secondary | ICD-10-CM | POA: Diagnosis not present

## 2020-08-26 DIAGNOSIS — N401 Enlarged prostate with lower urinary tract symptoms: Secondary | ICD-10-CM | POA: Diagnosis not present

## 2020-08-26 DIAGNOSIS — R351 Nocturia: Secondary | ICD-10-CM | POA: Diagnosis not present

## 2020-09-10 DIAGNOSIS — N401 Enlarged prostate with lower urinary tract symptoms: Secondary | ICD-10-CM | POA: Diagnosis not present

## 2020-09-10 DIAGNOSIS — R972 Elevated prostate specific antigen [PSA]: Secondary | ICD-10-CM | POA: Diagnosis not present

## 2020-09-10 DIAGNOSIS — R351 Nocturia: Secondary | ICD-10-CM | POA: Diagnosis not present

## 2020-12-20 DIAGNOSIS — Z79899 Other long term (current) drug therapy: Secondary | ICD-10-CM | POA: Diagnosis not present

## 2020-12-20 DIAGNOSIS — I10 Essential (primary) hypertension: Secondary | ICD-10-CM | POA: Diagnosis not present

## 2020-12-20 DIAGNOSIS — R7303 Prediabetes: Secondary | ICD-10-CM | POA: Diagnosis not present

## 2020-12-20 DIAGNOSIS — Z1331 Encounter for screening for depression: Secondary | ICD-10-CM | POA: Diagnosis not present

## 2020-12-20 DIAGNOSIS — R002 Palpitations: Secondary | ICD-10-CM | POA: Diagnosis not present

## 2020-12-20 DIAGNOSIS — E782 Mixed hyperlipidemia: Secondary | ICD-10-CM | POA: Diagnosis not present

## 2020-12-20 DIAGNOSIS — K219 Gastro-esophageal reflux disease without esophagitis: Secondary | ICD-10-CM | POA: Diagnosis not present

## 2020-12-29 ENCOUNTER — Other Ambulatory Visit (HOSPITAL_BASED_OUTPATIENT_CLINIC_OR_DEPARTMENT_OTHER): Payer: Self-pay

## 2020-12-29 DIAGNOSIS — G4710 Hypersomnia, unspecified: Secondary | ICD-10-CM

## 2020-12-29 DIAGNOSIS — R0683 Snoring: Secondary | ICD-10-CM

## 2021-02-09 ENCOUNTER — Other Ambulatory Visit: Payer: Self-pay

## 2021-02-09 ENCOUNTER — Ambulatory Visit (HOSPITAL_BASED_OUTPATIENT_CLINIC_OR_DEPARTMENT_OTHER): Payer: BC Managed Care – PPO | Attending: Physician Assistant | Admitting: Internal Medicine

## 2021-02-09 VITALS — Ht 66.0 in | Wt 180.0 lb

## 2021-02-09 DIAGNOSIS — G4733 Obstructive sleep apnea (adult) (pediatric): Secondary | ICD-10-CM

## 2021-02-09 DIAGNOSIS — G4710 Hypersomnia, unspecified: Secondary | ICD-10-CM

## 2021-02-09 DIAGNOSIS — R0683 Snoring: Secondary | ICD-10-CM | POA: Diagnosis not present

## 2021-02-13 DIAGNOSIS — R0683 Snoring: Secondary | ICD-10-CM

## 2021-02-13 NOTE — Procedures (Signed)
Patient Name: Nathaniel Jennings, Nathaniel Jennings Date: 02/09/2021 Gender: Male D.O.B: Oct 02, 1956 Age (years): 64 Referring Provider: Cyndi Bender Height (inches): 75 Interpreting Physician: Baird Lyons MD, ABSM Weight (lbs): 180 RPSGT: Zadie Rhine BMI: 29 MRN: [id number] Neck Size: 18.00  CLINICAL INFORMATION Sleep Study Type: Split Night CPAP Indication for sleep study: Snoring Epworth Sleepiness Score:  SLEEP STUDY TECHNIQUE As per the AASM Manual for the Scoring of Sleep and Associated Events v2.3 (April 2016) with a hypopnea requiring 4% desaturations.  The channels recorded and monitored were frontal, central and occipital EEG, electrooculogram (EOG), submentalis EMG (chin), nasal and oral airflow, thoracic and abdominal wall motion, anterior tibialis EMG, snore microphone, electrocardiogram, and pulse oximetry. Continuous positive airway pressure (CPAP) was initiated when the patient met split night criteria and was titrated according to treat sleep-disordered breathing.  MEDICATIONS Medications self-administered by patient taken the night of the study : none reported  RESPIRATORY PARAMETERS Diagnostic  Total AHI (/hr): 19.0 RDI (/hr): 24.9 OA Index (/hr): - CA Index (/hr): 0.0 REM AHI (/hr): N/A NREM AHI (/hr): 19.0 Supine AHI (/hr): 37.6 Non-supine AHI (/hr): 4.1 Min O2 Sat (%): 88.0 Mean O2 (%): 92.5 Time below 88% (min): 0   Titration  Optimal Pressure (cm): 15 AHI at Optimal Pressure (/hr): 0 Min O2 at Optimal Pressure (%): 91.0 Supine % at Optimal (%): 100 Sleep % at Optimal (%): 100   SLEEP ARCHITECTURE The recording time for the entire night was 391 minutes.  During a baseline period of 190.5 minutes, the patient slept for 132.7 minutes in REM and nonREM, yielding a sleep efficiency of 69.7%%. Sleep onset after lights out was 51.2 minutes with a REM latency of N/A minutes. The patient spent 1.9%% of the night in stage N1 sleep, 98.1%% in stage N2 sleep, 0.0%% in stage  N3 and 0% in REM.  During the titration period of 197.5 minutes, the patient slept for 187.8 minutes in REM and nonREM, yielding a sleep efficiency of 95.1%%. Sleep onset after CPAP initiation was 7.2 minutes with a REM latency of 12.5 minutes. The patient spent 2.4%% of the night in stage N1 sleep, 61.1%% in stage N2 sleep, 0.0%% in stage N3 and 36.5% in REM.  CARDIAC DATA The 2 lead EKG demonstrated sinus rhythm. The mean heart rate was 100.0 beats per minute. Other EKG findings include: PVCs.  LEG MOVEMENT DATA The total Periodic Limb Movements of Sleep (PLMS) were 0. The PLMS index was 0.0 .  IMPRESSIONS - Moderate obstructive sleep apnea occurred during the diagnostic portion of the study(AHI = 19.0/hour). An optimal PAP pressure was selected for this patient ( 15 cm of water) - No significant central sleep apnea occurred during the diagnostic portion of the study (CAI = 0.0/hour). - Moderate oxygen desaturation was noted during the diagnostic portion of the study (Min O2 =88.0%).Minimum O2 saturation on CPAP 15 was 91%. - Snoring was audible during the diagnostic portion of the study. - EKG findings include PVCs. - Clinically significant periodic limb movements did not occur during sleep.  DIAGNOSIS - Obstructive Sleep Apnea (G47.33)  RECOMMENDATIONS - Trial of CPAP therapy on 15 cm H2O or autopap 10-20. - Patient used a Medium size Resmed Full Face Mask AirFit F20 mask and heated humidification. - Be careful with alcohol, sedatives and other CNS depressants that may worsen sleep apnea and disrupt normal sleep architecture. - Sleep hygiene should be reviewed to assess factors that may improve sleep quality. - Weight management and regular exercise should  be initiated or continued.  [Electronically signed] 02/13/2021 11:01 AM  Baird Lyons MD, ABSM Diplomate, American Board of Sleep Medicine   NPI: [id number]                          [place], [place] [facility]  ELECTRONICALLY SIGNED ON:  02/13/2021, 10:58 AM [place] PH: [phone]   FX: [phone] [place]

## 2021-03-03 DIAGNOSIS — R351 Nocturia: Secondary | ICD-10-CM | POA: Diagnosis not present

## 2021-03-03 DIAGNOSIS — N401 Enlarged prostate with lower urinary tract symptoms: Secondary | ICD-10-CM | POA: Diagnosis not present

## 2021-03-11 DIAGNOSIS — R351 Nocturia: Secondary | ICD-10-CM | POA: Diagnosis not present

## 2021-03-11 DIAGNOSIS — R972 Elevated prostate specific antigen [PSA]: Secondary | ICD-10-CM | POA: Diagnosis not present

## 2021-03-11 DIAGNOSIS — N401 Enlarged prostate with lower urinary tract symptoms: Secondary | ICD-10-CM | POA: Diagnosis not present

## 2021-04-06 DIAGNOSIS — K219 Gastro-esophageal reflux disease without esophagitis: Secondary | ICD-10-CM | POA: Diagnosis not present

## 2021-04-06 DIAGNOSIS — R1310 Dysphagia, unspecified: Secondary | ICD-10-CM | POA: Diagnosis not present

## 2021-04-06 DIAGNOSIS — Z6828 Body mass index (BMI) 28.0-28.9, adult: Secondary | ICD-10-CM | POA: Diagnosis not present

## 2021-04-14 ENCOUNTER — Other Ambulatory Visit: Payer: Self-pay | Admitting: Physician Assistant

## 2021-04-14 DIAGNOSIS — R1319 Other dysphagia: Secondary | ICD-10-CM | POA: Diagnosis not present

## 2021-04-14 DIAGNOSIS — R590 Localized enlarged lymph nodes: Secondary | ICD-10-CM

## 2021-04-18 ENCOUNTER — Ambulatory Visit
Admission: RE | Admit: 2021-04-18 | Discharge: 2021-04-18 | Disposition: A | Discharge status: Home / Self Care | Payer: BLUE CROSS/BLUE SHIELD | Source: Ambulatory Visit | Attending: Physician Assistant | Admitting: Physician Assistant

## 2021-04-18 ENCOUNTER — Other Ambulatory Visit: Payer: Self-pay

## 2021-04-18 DIAGNOSIS — R1310 Dysphagia, unspecified: Secondary | ICD-10-CM | POA: Diagnosis not present

## 2021-04-18 DIAGNOSIS — R590 Localized enlarged lymph nodes: Secondary | ICD-10-CM

## 2021-04-18 DIAGNOSIS — R1319 Other dysphagia: Secondary | ICD-10-CM

## 2021-04-19 ENCOUNTER — Other Ambulatory Visit: Payer: Self-pay | Admitting: Gastroenterology

## 2021-04-19 DIAGNOSIS — K317 Polyp of stomach and duodenum: Secondary | ICD-10-CM | POA: Diagnosis not present

## 2021-04-19 DIAGNOSIS — K2289 Other specified disease of esophagus: Secondary | ICD-10-CM

## 2021-04-19 DIAGNOSIS — R12 Heartburn: Secondary | ICD-10-CM | POA: Diagnosis not present

## 2021-04-19 DIAGNOSIS — C159 Malignant neoplasm of esophagus, unspecified: Secondary | ICD-10-CM | POA: Diagnosis not present

## 2021-04-19 DIAGNOSIS — K2281 Esophageal polyp: Secondary | ICD-10-CM | POA: Diagnosis not present

## 2021-04-19 DIAGNOSIS — R1310 Dysphagia, unspecified: Secondary | ICD-10-CM | POA: Diagnosis not present

## 2021-04-19 DIAGNOSIS — K449 Diaphragmatic hernia without obstruction or gangrene: Secondary | ICD-10-CM | POA: Diagnosis not present

## 2021-04-28 ENCOUNTER — Other Ambulatory Visit: Payer: Self-pay

## 2021-04-28 DIAGNOSIS — C158 Malignant neoplasm of overlapping sites of esophagus: Secondary | ICD-10-CM

## 2021-04-28 NOTE — Progress Notes (Signed)
I spoke with Nathaniel Jennings and introduced myself and explained my role as the GI Nurse Navigator.  I reviewed new patient appt date and time with Dr Burr Medico.  He was agreeable. I also told him that Dr Burr Medico has placed a referral for radiation oncology consult. I reviewed with him that 1 person may accompany him.  He should arrive for his appt 15-20 minutes early for registration.  I let him know that we have free valet service.  I provide him with our address and my direct phone number.  I encouraged him to call with any questions or concerns

## 2021-04-29 DIAGNOSIS — G4730 Sleep apnea, unspecified: Secondary | ICD-10-CM | POA: Diagnosis not present

## 2021-05-05 ENCOUNTER — Ambulatory Visit
Admission: RE | Admit: 2021-05-05 | Discharge: 2021-05-05 | Disposition: A | Discharge status: Home / Self Care | Payer: BC Managed Care – PPO | Source: Ambulatory Visit | Attending: Gastroenterology | Admitting: Gastroenterology

## 2021-05-05 DIAGNOSIS — K8020 Calculus of gallbladder without cholecystitis without obstruction: Secondary | ICD-10-CM | POA: Diagnosis not present

## 2021-05-05 DIAGNOSIS — R911 Solitary pulmonary nodule: Secondary | ICD-10-CM | POA: Diagnosis not present

## 2021-05-05 DIAGNOSIS — K2289 Other specified disease of esophagus: Secondary | ICD-10-CM

## 2021-05-05 MED ORDER — IOPAMIDOL (ISOVUE-300) INJECTION 61%
100.0000 mL | Freq: Once | INTRAVENOUS | Status: AC | PRN
  Administered 2021-05-05: 100 mL via INTRAVENOUS

## 2021-05-09 ENCOUNTER — Inpatient Hospital Stay: Payer: BC Managed Care – PPO | Attending: Hematology | Admitting: Hematology

## 2021-05-09 ENCOUNTER — Encounter: Payer: Self-pay | Admitting: Hematology

## 2021-05-09 ENCOUNTER — Other Ambulatory Visit: Payer: Self-pay

## 2021-05-09 VITALS — BP 161/84 | HR 78 | Temp 98.3°F | Resp 18 | Ht 66.0 in | Wt 165.8 lb

## 2021-05-09 DIAGNOSIS — Z7982 Long term (current) use of aspirin: Secondary | ICD-10-CM

## 2021-05-09 DIAGNOSIS — N400 Enlarged prostate without lower urinary tract symptoms: Secondary | ICD-10-CM

## 2021-05-09 DIAGNOSIS — I10 Essential (primary) hypertension: Secondary | ICD-10-CM

## 2021-05-09 DIAGNOSIS — E785 Hyperlipidemia, unspecified: Secondary | ICD-10-CM | POA: Diagnosis not present

## 2021-05-09 DIAGNOSIS — Z79899 Other long term (current) drug therapy: Secondary | ICD-10-CM | POA: Diagnosis not present

## 2021-05-09 DIAGNOSIS — Z87891 Personal history of nicotine dependence: Secondary | ICD-10-CM | POA: Insufficient documentation

## 2021-05-09 DIAGNOSIS — Z85820 Personal history of malignant melanoma of skin: Secondary | ICD-10-CM | POA: Diagnosis not present

## 2021-05-09 DIAGNOSIS — C158 Malignant neoplasm of overlapping sites of esophagus: Secondary | ICD-10-CM | POA: Insufficient documentation

## 2021-05-09 NOTE — Progress Notes (Signed)
START ON PATHWAY REGIMEN - Gastroesophageal     Administer weekly during RT:     Paclitaxel      Carboplatin   **Always confirm dose/schedule in your pharmacy ordering system**  Patient Characteristics: Esophageal & GE Junction, Adenocarcinoma, Preoperative or Nonsurgical Candidate (Clinical Staging), cT2 or Higher or cN+, Surgical Candidate (Up to cT4a) - Preoperative Therapy, Esophageal Histology: Adenocarcinoma Disease Classification: Esophageal Therapeutic Status: Preoperative or Nonsurgical Candidate (Clinical Staging) AJCC Grade: G2 AJCC 8 Stage Grouping: Unknown AJCC T Category: cTX AJCC N Category: cN1 AJCC M Category: cM0 Intent of Therapy: Curative Intent, Discussed with Patient

## 2021-05-09 NOTE — Progress Notes (Signed)
I spoke with Nathaniel Jennings and his ex wife.  I explained my role as the nurse navigator and provided my contact information.  I told them I will call them when I have schedule his PET scan.  I provided the instructions for this.  He is having difficulty swallowing.  I encouraged high calori protein drinks and soft foods.  A nutrition referral has been placed.  I briefly told tham he would be getting daily radiation and weekly chemotherapy.  I told him he will be scheduled for chemo education class prior to receiving chemotherapy.  All questions were answered.  I encouraged them to call with any questions or concerns.  They verbalized understanding.

## 2021-05-09 NOTE — Progress Notes (Signed)
[place]   [phone] Fax:([phone] [name] Note   Patient Care Team: Cyndi Bender, Hershal Coria as PCP - General (Physician Assistant)  Date of Service:  05/09/2021   CHIEF COMPLAINTS/PURPOSE OF CONSULTATION:  Esophageal cancer  REFERRING PHYSICIAN:  Dr. Alphia Kava  Oncology History  Malignant neoplasm of overlapping sites of esophagus ([place])  05/09/2021 Initial Diagnosis   Malignant neoplasm of overlapping sites of esophagus ([place])    05/23/2021 -  Chemotherapy    Patient is on Treatment Plan: ESOPHAGUS CARBOPLATIN/PACLITAXEL WEEKLY X 6 WEEKS WITH XRT            HISTORY OF PRESENTING ILLNESS:  Nathaniel Jennings 65 y.o. male is a here because of newly diagnosed esophageal cancer. The patient was referred by GI Dr. Alessandra Bevels. The patient presents to the clinic today accompanied by ex-wife Nathaniel Jennings.   He initially presented to his PCP with increasing dysphasia in the setting of known GERD in early May. It happens more in the morning with regurgitation after eating. He was referred to [facility] on 04/14/21. He was found to have palpable right-sided neck lymphadenopathy on physical exam. Neck US on 04/18/21 was negative. He proceeded to EGD under Dr. Alessandra Bevels on 04/19/21, which revealed a polyps in mid esophagus and a partially-obstructing, likely malignant, distal esophagus mass. Biopsy of the polyp and mass were obtained and pathology confirmed esophageal adenocarcinoma.  He proceeded to staging CT CAP on 05/05/21 showing: known esophageal mass not well visualized, mild irregular wall thickening of distal esophagus; indeterminate mildly-enlarged high right paratracheal node near thoracic inlet; no other mediastinal adenopathy or metastatic disease in abdomen or pelvis; stable small bilateral pulmonary nodules bilaterally consistent with benign findings.  He is in soft diet now which he tolerates well, not on any liquid nutrition. He has lost  4-5 lbs in the past 3 months.  He denies odynophagia, no abdominal discomfort, bloating or nausea. Energy normal, he remains physically active. 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   He has a PMHx of HTN, BPH, elevated PSA (with two negative prostate biopsies in 2016 and 2017), melanoma excision in 04/2010.   Socially, he is divorced and lives alone. He is a former cigarette smoker who smoked 2 ppd for 42 years, quitting in 02/2011.  He drinks alcohol moderately. 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    REVIEW OF SYSTEMS:    Constitutional: Denies fevers, chills or abnormal night sweats Eyes: Denies blurriness of vision, double vision or watery eyes Ears, nose, mouth, throat, and face: Denies mucositis or sore throat Respiratory: Denies cough, dyspnea or wheezes Cardiovascular: Denies palpitation, chest discomfort or lower extremity swelling Gastrointestinal:  Denies nausea, heartburn or change in bowel habits, (+) dysphagia with solid food Skin: Denies abnormal skin rashes Lymphatics: Denies new lymphadenopathy or easy bruising Neurological:Denies numbness, tingling or new weaknesses Behavioral/Psych: Mood is stable, no new changes  All other systems were reviewed with the patient and are negative.   MEDICAL HISTORY:  Past Medical History:  Diagnosis Date   BPH (benign prostatic hyperplasia)    Dyslipidemia    Elevated PSA    Esophageal cancer ([place])    Full dentures    History of melanoma excision    07/ 2011  s/p  wide excision left back--- per pathology-- negative maligant --  mild melanocytic atypia   History of palpitations    cardiologist --  dr Shelva Majestic (last note in  epic 2014   Hypertension     SURGICAL HISTORY: Past Surgical History:  Procedure Laterality Date   COLONOSCOPY  2009   PROSTATE BIOPSY N/A 07/02/2015   Procedure: BIOPSY TRANSRECTAL ULTRASONIC PROSTATE (TUBP);  Surgeon: Lowella Bandy, MD;  [facility];  Service: Urology;  Laterality: N/A;   PROSTATE BIOPSY N/A 11/22/2015   Procedure: BIOPSY TRANSRECTAL ULTRASONIC PROSTATE (TUBP);  Surgeon: Cleon Gustin, MD;  Location: [facility];  Service: Urology;  Laterality: N/A;   TRANSTHORACIC ECHOCARDIOGRAM  11-19-2012   normal echo/  trivial TR   WIDE EXCISION MELANOMA LEFT BACK  05-02-2010    SOCIAL HISTORY: Social History   Socioeconomic History   Marital status: Single    Spouse name: Not on file   Number of children: 2   Years of education: Not on file   Highest education level: Not on file  Occupational History   Occupation: Architect  Tobacco Use   Smoking status: Former    Packs/day: 2.00    Years: 42.00    Pack years: 84.00    Types: Cigarettes    Quit date: 03/15/2011    Years since quitting: 10.1   Smokeless tobacco: Current    Types: Snuff   Tobacco comments:    occasional dip tobacco  Substance and Sexual Activity   Alcohol use: Yes    Alcohol/week: 24.0 standard drinks    Types: 24 Cans of beer per week    Comment: for 40 years   Drug use: Not on file   Sexual activity: Not on file  Other Topics Concern   Not on file  Social History Narrative   Lives alone.  Architect   Social Determinants of Radio broadcast assistant Strain: Not on file  Food Insecurity: Not on file  Transportation Needs: Not on file  Physical Activity: Not on file  Stress: Not on file  Social Connections: Not on file  Intimate Partner Violence: Not on file    FAMILY HISTORY: Family History  Problem Relation Age of Onset   Kidney failure Mother        died age 35   Pneumonia Father        died age 25    ALLERGIES:  has No Known Allergies.  MEDICATIONS:  Current Outpatient Medications  Medication Sig Dispense Refill   aspirin EC 81 MG tablet Take 81 mg by mouth daily.     Krill Oil 1000 MG CAPS Take 1 capsule by mouth daily.     metoprolol succinate (TOPROL-XL) 100 MG 24 hr tablet  Take 1 tablet by mouth daily.     No current facility-administered medications for this visit.    PHYSICAL EXAMINATION: ECOG PERFORMANCE STATUS: 1 - Symptomatic but completely ambulatory  Vitals:   05/09/21 1459  BP: (!) 161/84  Pulse: 78  Resp: 18  Temp: 98.3 F (36.8 C)  SpO2: 100%   Filed Weights   05/09/21 1459  Weight: 165 lb 12.8 oz (75.2 kg)    GENERAL:alert, no distress and comfortable SKIN: skin color, texture, turgor are normal, no rashes or significant lesions EYES: normal, Conjunctiva are pink and non-injected, sclera clear NECK: supple, thyroid normal size, non-tender, without nodularity LYMPH:  no palpable lymphadenopathy in the cervical, axillary  LUNGS: clear to auscultation and percussion with normal breathing effort HEART: regular rate & rhythm and no murmurs and no lower extremity edema ABDOMEN:abdomen soft, non-tender and normal bowel sounds Musculoskeletal:no cyanosis of digits and no clubbing  NEURO: alert & oriented x 3 with fluent speech, no focal motor/sensory deficits  LABORATORY DATA:  I have reviewed the data as listed CBC Latest Ref Rng & Units 11/22/2015 07/02/2015 04/28/2010  WBC 4.0 - 10.5 K/uL - - 7.7  Hemoglobin 13.0 - 17.0 g/dL 16.0 15.6 17.6 REPEATED TO VERIFY(H)  Hematocrit 39.0 - 52.0 % 47.0 46.0 49.4  Platelets 150 - 400 K/uL - - 201    CMP Latest Ref Rng & Units 11/22/2015 07/02/2015 04/28/2010  Glucose 65 - 99 mg/dL 98 102(H) 100(H)  BUN 6 - 20 mg/dL _0 Creatinine 0.61 - 1.24 mg/dL 1.00 1.00 1.04  Sodium 135 - 145 mmol/L 142 140 141  Potassium 3.5 - 5.1 mmol/L 4.2 4.4 3.9  Chloride 101 - 111 mmol/L 103 102 106  CO2 19 - 32 mEq/L - - 27  Calcium 8.4 - 10.5 mg/dL - - 9.8  Total Protein 6.0 - 8.3 g/dL - - -  Total Bilirubin 0.3 - 1.2 mg/dL - - -  Alkaline Phos 39 - 117 units/L - - -  AST 0 - 37 units/L - - -  ALT 0 - 53 units/L - - -     RADIOGRAPHIC STUDIES: I have personally reviewed the radiological images as listed and  agreed with the findings in the report. US SOFT TISSUE HEAD & NECK (NON-THYROID)  Result Date: 04/18/2021 CLINICAL DATA:  Dysphagia, palpable abnormality EXAM: ULTRASOUND OF HEAD/NECK SOFT TISSUES TECHNIQUE: Ultrasound examination of the head and neck soft tissues was performed in the area of clinical concern. COMPARISON:  None. FINDINGS: Sonographic interrogation of the right and left neck demonstrates normal subcutaneous fat, should muscles and vascular structures. No evidence of abnormal lymphadenopathy. No solid or cystic masses. IMPRESSION: No suspicious lymphadenopathy. Electronically Signed   By: Jacqulynn Cadet M.D.   On: 04/18/2021 11:37   CT CHEST ABDOMEN PELVIS W CONTRAST  Result Date: 05/07/2021 CLINICAL DATA:  Esophageal mass. Creatinine was obtained on site at [facility] at [address] Results: Creatinine 1.0 mg/dL. EXAM: CT CHEST, ABDOMEN, AND PELVIS WITH CONTRAST TECHNIQUE: Multidetector CT imaging of the chest, abdomen and pelvis was performed following the standard protocol during bolus administration of intravenous contrast. CONTRAST:  169m ISOVUE-300 IOPAMIDOL (ISOVUE-300) INJECTION 61% COMPARISON:  CT angiography of the chest and abdomen 11/21/2007 FINDINGS: CT CHEST FINDINGS Cardiovascular: No acute vascular findings. Minimal aortic atherosclerosis. The heart size is normal. There is no pericardial effusion. Mediastinum/Nodes: Mild wall thickening and irregularity of the distal esophagus without obvious focal mass lesion. The proximal esophagus appears normal. However, there is a 1.2 cm nodule in the high right tracheoesophageal groove (image 7/2). No other enlarged mediastinal, hilar or axillary lymph nodes are identified. The thyroid gland and trachea appear normal. Lungs/Pleura: No pleural effusion or pneumothorax. Stable 5 mm right lower lobe nodule on image 107/4. Stable 3 mm left upper lobe nodule on image 70/4. No new or enlarging nodules. Musculoskeletal/Chest  wall: No chest wall mass or suspicious osseous findings. Old right scapular and rib fractures. The right 1st and 2nd ribs are partially ankylosed. CT ABDOMEN AND PELVIS FINDINGS Hepatobiliary: The liver is normal in density without suspicious focal abnormality. Small gallstone at the gallbladder fundus. No evidence of gallbladder wall thickening or biliary dilatation. Pancreas: Unremarkable. No pancreatic ductal dilatation or surrounding inflammatory changes. Spleen: Normal in size without focal abnormality. Adrenals/Urinary Tract: Both adrenal glands appear normal. Small renal cysts bilaterally. No evidence of urinary tract calculus or hydronephrosis. The  bladder appears unremarkable. Stomach/Bowel: Enteric contrast was administered and has passed to the splenic flexure of the colon. The stomach appears unremarkable for its degree of distension. No evidence of bowel wall thickening, distention or surrounding inflammatory change. The appendix appears normal. Incidental diverticulum of the 3rd portion of the duodenum. Vascular/Lymphatic: There are no enlarged abdominal or pelvic lymph nodes. Small lymph nodes in the gastrohepatic ligament and porta hepatis are unchanged from the remote study. Mild aortic and branch vessel atherosclerosis without aneurysm or acute vascular findings. Reproductive: The prostate gland is moderately enlarged. Other: No evidence of abdominal wall mass or hernia. No ascites. Musculoskeletal: No acute or significant osseous findings. T12 hemangioma noted. IMPRESSION: 1. The patient's known esophageal mass is not well visualized. There is mild irregular wall thickening of the distal esophagus. Correlate with previous clinical evaluation. 2. Indeterminate mildly enlarged high right paratracheal node near the thoracic inlet. No other mediastinal adenopathy. 3. No evidence of metastatic disease in the abdomen or pelvis. 4. Stable small pulmonary nodules bilaterally consistent with benign  findings. 5. Cholelithiasis, small renal cysts and Aortic Atherosclerosis (ICD10-I70.0). Electronically Signed   By: Richardean Sale M.D.   On: 05/07/2021 14:51    ASSESSMENT & PLAN:  JAVIONE GUNAWAN is a 65 y.o.  male with a history of HTN and BPH, presented with dysphagia  Two esophageal Adenocarcinoma in mid and distal esophagus, [id number] -He was initially referred to [facility] for dysphasia with solid food and regurgitation. -EGD performed by Dr. Alessandra Bevels on 04/19/21 showed a partially obstructing, likely malignant, esophageal tumor in the distal esophagus and a polyp in the mid esophagus. Biopsy of both confirmed invasive moderately differentiated adenocarcinoma of the esophagus, Her2 negative. -He proceeded to staging CT CAP on 05/05/21 showing: esophageal mass not well visualized; indeterminate mildly-enlarged high right paratracheal node near thoracic inlet; no other mediastinal adenopathy or evidence of metastatic disease in abdomen or pelvis; stable small pulmonary nodules bilaterally. -I recommend a PET scan for further staging, which is more sensitive than CT scan, especially for evaluation of adenopathy. -I also recommend EUS for tumor staging, as part of evaluation for surgical resectability. -If his EUS shows T3/4 tumor, or PET scan shows local regional adenopathy, then the standard care is neoadjuvant chemoradiation, followed by surgical resection if it is resectable. -If he has residual disease after neoadjuvant chemoradiation, I would also recommend adjuvant nivolumab for 1 year, based on NCCN guideline. -He is scheduled to meet Dr. Isidore Moos on 05/13/21. -I discussed chemo regiment with weekly carbo and Taxol, if surgery is planned.  Benefit and side effect discussed with patient and his wife in detail.  He is interested in treatment we recommend. -I will present his case in thoracic tumor conference next week -I will refer him to thoracic surgeon Dr. Kipp Brood   2.  Dysphagia with  mild weight loss -We discussed nutrition supplement, especially with liquid nutrition, such as Ensure, boost, Carnation breakfast -Nutritional consult -We discussed that the small possibility of feeding tube if his dysphagia gets much worse after chemoradiation  3. HTN and BPH -continue medication, will monitor his blood pressure closely during treatment.  PLAN:  -PET scan next week -EUS by Dr. Paulita Fujita. I will contact [facility]  -lab, chemo class and f/u after the above work up -We will tentatively schedule his first treatment on August 1, after review with Dr. Isidore Moos  -Nutritional consult   Orders Placed This Encounter  Procedures   NM PET Image Initial (PI) Skull Base  To Thigh    Standing Status:   Future    Standing Expiration Date:   05/09/2022    Order Specific Question:   If indicated for the ordered procedure, I authorize the administration of a radiopharmaceutical per Radiology protocol    Answer:   Yes    Order Specific Question:   Preferred imaging location?    Answer:   [facility]   Ambulatory Referral to [facility] Nutrition    Referral Priority:   Urgent    Referral Type:   Consultation    Referral Reason:   Specialty Services Required    Number of Visits Requested:   1    All questions were answered. The patient knows to call the clinic with any problems, questions or concerns. The total time spent in the appointment was 60 minutes.     [name], MD 05/09/2021   I, Wilburn Mylar, am acting as scribe for [name], MD.   I have reviewed the above documentation for accuracy and completeness, and I agree with the above.

## 2021-05-09 NOTE — H&P (View-Only) (Signed)
[place]   [phone] [phone] [place] Note   Patient Care Team: Cyndi Bender, Hershal Coria as PCP - General (Physician Assistant)  Date of Service:  05/09/2021   CHIEF COMPLAINTS/PURPOSE OF CONSULTATION:  Esophageal cancer  REFERRING PHYSICIAN:  Dr. Alphia Kava  Oncology History  Malignant neoplasm of overlapping sites of esophagus ([place])  05/09/2021 Initial Diagnosis   Malignant neoplasm of overlapping sites of esophagus ([place])    05/23/2021 -  Chemotherapy    Patient is on Treatment Plan: ESOPHAGUS CARBOPLATIN/PACLITAXEL WEEKLY X 6 WEEKS WITH XRT            HISTORY OF PRESENTING ILLNESS:  Nathaniel Jennings 65 y.o. male is a here because of newly diagnosed esophageal cancer. The patient was referred by GI Dr. Alessandra Bevels. The patient presents to the clinic today accompanied by ex-wife Nathaniel Jennings.   He initially presented to his PCP with increasing dysphasia in the setting of known GERD in early May. It happens more in the morning with regurgitation after eating. He was referred to [facility] on 04/14/21. He was found to have palpable right-sided neck lymphadenopathy on physical exam. Neck US on 04/18/21 was negative. He proceeded to EGD under Dr. Alessandra Bevels on 04/19/21, which revealed a polyps in mid esophagus and a partially-obstructing, likely malignant, distal esophagus mass. Biopsy of the polyp and mass were obtained and pathology confirmed esophageal adenocarcinoma.  He proceeded to staging CT CAP on 05/05/21 showing: known esophageal mass not well visualized, mild irregular wall thickening of distal esophagus; indeterminate mildly-enlarged high right paratracheal node near thoracic inlet; no other mediastinal adenopathy or metastatic disease in abdomen or pelvis; stable small bilateral pulmonary nodules bilaterally consistent with benign findings.  He is in soft diet now which he tolerates well, not on any liquid nutrition. He has lost  4-5 lbs in the past 3 months.  He denies odynophagia, no abdominal discomfort, bloating or nausea. Energy normal, he remains physically active. 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   He has a PMHx of HTN, BPH, elevated PSA (with two negative prostate biopsies in 2016 and 2017), melanoma excision in 04/2010.   Socially, he is divorced and lives alone. He is a former cigarette smoker who smoked 2 ppd for 42 years, quitting in 02/2011.  He drinks alcohol moderately. 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    REVIEW OF SYSTEMS:    Constitutional: Denies fevers, chills or abnormal night sweats Eyes: Denies blurriness of vision, double vision or watery eyes Ears, nose, mouth, throat, and face: Denies mucositis or sore throat Respiratory: Denies cough, dyspnea or wheezes Cardiovascular: Denies palpitation, chest discomfort or lower extremity swelling Gastrointestinal:  Denies nausea, heartburn or change in bowel habits, (+) dysphagia with solid food Skin: Denies abnormal skin rashes Lymphatics: Denies new lymphadenopathy or easy bruising Neurological:Denies numbness, tingling or new weaknesses Behavioral/Psych: Mood is stable, no new changes  All other systems were reviewed with the patient and are negative.   MEDICAL HISTORY:  Past Medical History:  Diagnosis Date   BPH (benign prostatic hyperplasia)    Dyslipidemia    Elevated PSA    Esophageal cancer ([place])    Full dentures    History of melanoma excision    07/ 2011  s/p  wide excision left back--- per pathology-- negative maligant --  mild melanocytic atypia   History of palpitations    cardiologist --  dr Shelva Majestic (last note in  epic 2014   Hypertension     SURGICAL HISTORY: Past Surgical History:  Procedure Laterality Date   COLONOSCOPY  2009   PROSTATE BIOPSY N/A 07/02/2015   Procedure: BIOPSY TRANSRECTAL ULTRASONIC PROSTATE (TUBP);  Surgeon: Lowella Bandy, MD;  Location[facility];  Service: Urology;  Laterality: N/A;   PROSTATE BIOPSY N/A 11/22/2015   Procedure: BIOPSY TRANSRECTAL ULTRASONIC PROSTATE (TUBP);  Surgeon: Cleon Gustin, MD;  Location: [facility];  Service: Urology;  Laterality: N/A;   TRANSTHORACIC ECHOCARDIOGRAM  11-19-2012   normal echo/  trivial TR   WIDE EXCISION MELANOMA LEFT BACK  05-02-2010    SOCIAL HISTORY: Social History   Socioeconomic History   Marital status: Single    Spouse name: Not on file   Number of children: 2   Years of education: Not on file   Highest education level: Not on file  Occupational History   Occupation: Architect  Tobacco Use   Smoking status: Former    Packs/day: 2.00    Years: 42.00    Pack years: 84.00    Types: Cigarettes    Quit date: 03/15/2011    Years since quitting: 10.1   Smokeless tobacco: Current    Types: Snuff   Tobacco comments:    occasional dip tobacco  Substance and Sexual Activity   Alcohol use: Yes    Alcohol/week: 24.0 standard drinks    Types: 24 Cans of beer per week    Comment: for 40 years   Drug use: Not on file   Sexual activity: Not on file  Other Topics Concern   Not on file  Social History Narrative   Lives alone.  Architect   Social Determinants of Radio broadcast assistant Strain: Not on file  Food Insecurity: Not on file  Transportation Needs: Not on file  Physical Activity: Not on file  Stress: Not on file  Social Connections: Not on file  Intimate Partner Violence: Not on file    FAMILY HISTORY: Family History  Problem Relation Age of Onset   Kidney failure Mother        died age 52   Pneumonia Father        died age 1    ALLERGIES:  has No Known Allergies.  MEDICATIONS:  Current Outpatient Medications  Medication Sig Dispense Refill   aspirin EC 81 MG tablet Take 81 mg by mouth daily.     Krill Oil 1000 MG CAPS Take 1 capsule by mouth daily.     metoprolol succinate (TOPROL-XL) 100 MG 24 hr tablet  Take 1 tablet by mouth daily.     No current facility-administered medications for this visit.    PHYSICAL EXAMINATION: ECOG PERFORMANCE STATUS: 1 - Symptomatic but completely ambulatory  Vitals:   05/09/21 1459  BP: (!) 161/84  Pulse: 78  Resp: 18  Temp: 98.3 F (36.8 C)  SpO2: 100%   Filed Weights   05/09/21 1459  Weight: 165 lb 12.8 oz (75.2 kg)    GENERAL:alert, no distress and comfortable SKIN: skin color, texture, turgor are normal, no rashes or significant lesions EYES: normal, Conjunctiva are pink and non-injected, sclera clear NECK: supple, thyroid normal size, non-tender, without nodularity LYMPH:  no palpable lymphadenopathy in the cervical, axillary  LUNGS: clear to auscultation and percussion with normal breathing effort HEART: regular rate & rhythm and no murmurs and no lower extremity edema ABDOMEN:abdomen soft, non-tender and normal bowel sounds Musculoskeletal:no cyanosis of digits and no clubbing  NEURO: alert & oriented x 3 with fluent speech, no focal motor/sensory deficits  LABORATORY DATA:  I have reviewed the data as listed CBC Latest Ref Rng & Units 11/22/2015 07/02/2015 04/28/2010  WBC 4.0 - 10.5 K/uL - - 7.7  Hemoglobin 13.0 - 17.0 g/dL 16.0 15.6 17.6 REPEATED TO VERIFY(H)  Hematocrit 39.0 - 52.0 % 47.0 46.0 49.4  Platelets 150 - 400 K/uL - - 201    CMP Latest Ref Rng & Units 11/22/2015 07/02/2015 04/28/2010  Glucose 65 - 99 mg/dL 98 102(H) 100(H)  BUN 6 - 20 mg/dL _0 Creatinine 0.61 - 1.24 mg/dL 1.00 1.00 1.04  Sodium 135 - 145 mmol/L 142 140 141  Potassium 3.5 - 5.1 mmol/L 4.2 4.4 3.9  Chloride 101 - 111 mmol/L 103 102 106  CO2 19 - 32 mEq/L - - 27  Calcium 8.4 - 10.5 mg/dL - - 9.8  Total Protein 6.0 - 8.3 g/dL - - -  Total Bilirubin 0.3 - 1.2 mg/dL - - -  Alkaline Phos 39 - 117 units/L - - -  AST 0 - 37 units/L - - -  ALT 0 - 53 units/L - - -     RADIOGRAPHIC STUDIES: I have personally reviewed the radiological images as listed and  agreed with the findings in the report. US SOFT TISSUE HEAD & NECK (NON-THYROID)  Result Date: 04/18/2021 CLINICAL DATA:  Dysphagia, palpable abnormality EXAM: ULTRASOUND OF HEAD/NECK SOFT TISSUES TECHNIQUE: Ultrasound examination of the head and neck soft tissues was performed in the area of clinical concern. COMPARISON:  None. FINDINGS: Sonographic interrogation of the right and left neck demonstrates normal subcutaneous fat, should muscles and vascular structures. No evidence of abnormal lymphadenopathy. No solid or cystic masses. IMPRESSION: No suspicious lymphadenopathy. Electronically Signed   By: Jacqulynn Cadet M.D.   On: 04/18/2021 11:37   CT CHEST ABDOMEN PELVIS W CONTRAST  Result Date: 05/07/2021 CLINICAL DATA:  Esophageal mass. Creatinine was obtained on site at [facility] at [address] Results: Creatinine 1.0 mg/dL. EXAM: CT CHEST, ABDOMEN, AND PELVIS WITH CONTRAST TECHNIQUE: Multidetector CT imaging of the chest, abdomen and pelvis was performed following the standard protocol during bolus administration of intravenous contrast. CONTRAST:  127m ISOVUE-300 IOPAMIDOL (ISOVUE-300) INJECTION 61% COMPARISON:  CT angiography of the chest and abdomen 11/21/2007 FINDINGS: CT CHEST FINDINGS Cardiovascular: No acute vascular findings. Minimal aortic atherosclerosis. The heart size is normal. There is no pericardial effusion. Mediastinum/Nodes: Mild wall thickening and irregularity of the distal esophagus without obvious focal mass lesion. The proximal esophagus appears normal. However, there is a 1.2 cm nodule in the high right tracheoesophageal groove (image 7/2). No other enlarged mediastinal, hilar or axillary lymph nodes are identified. The thyroid gland and trachea appear normal. Lungs/Pleura: No pleural effusion or pneumothorax. Stable 5 mm right lower lobe nodule on image 107/4. Stable 3 mm left upper lobe nodule on image 70/4. No new or enlarging nodules. Musculoskeletal/Chest  wall: No chest wall mass or suspicious osseous findings. Old right scapular and rib fractures. The right 1st and 2nd ribs are partially ankylosed. CT ABDOMEN AND PELVIS FINDINGS Hepatobiliary: The liver is normal in density without suspicious focal abnormality. Small gallstone at the gallbladder fundus. No evidence of gallbladder wall thickening or biliary dilatation. Pancreas: Unremarkable. No pancreatic ductal dilatation or surrounding inflammatory changes. Spleen: Normal in size without focal abnormality. Adrenals/Urinary Tract: Both adrenal glands appear normal. Small renal cysts bilaterally. No evidence of urinary tract calculus or hydronephrosis. The  bladder appears unremarkable. Stomach/Bowel: Enteric contrast was administered and has passed to the splenic flexure of the colon. The stomach appears unremarkable for its degree of distension. No evidence of bowel wall thickening, distention or surrounding inflammatory change. The appendix appears normal. Incidental diverticulum of the 3rd portion of the duodenum. Vascular/Lymphatic: There are no enlarged abdominal or pelvic lymph nodes. Small lymph nodes in the gastrohepatic ligament and porta hepatis are unchanged from the remote study. Mild aortic and branch vessel atherosclerosis without aneurysm or acute vascular findings. Reproductive: The prostate gland is moderately enlarged. Other: No evidence of abdominal wall mass or hernia. No ascites. Musculoskeletal: No acute or significant osseous findings. T12 hemangioma noted. IMPRESSION: 1. The patient's known esophageal mass is not well visualized. There is mild irregular wall thickening of the distal esophagus. Correlate with previous clinical evaluation. 2. Indeterminate mildly enlarged high right paratracheal node near the thoracic inlet. No other mediastinal adenopathy. 3. No evidence of metastatic disease in the abdomen or pelvis. 4. Stable small pulmonary nodules bilaterally consistent with benign  findings. 5. Cholelithiasis, small renal cysts and Aortic Atherosclerosis (ICD10-I70.0). Electronically Signed   By: Richardean Sale M.D.   On: 05/07/2021 14:51    ASSESSMENT & PLAN:  MILAS SCHAPPELL is a 65 y.o.  male with a history of HTN and BPH, presented with dysphagia  Two esophageal Adenocarcinoma in mid and distal esophagus, [id number] -He was initially referred to [facility] for dysphasia with solid food and regurgitation. -EGD performed by Dr. Alessandra Bevels on 04/19/21 showed a partially obstructing, likely malignant, esophageal tumor in the distal esophagus and a polyp in the mid esophagus. Biopsy of both confirmed invasive moderately differentiated adenocarcinoma of the esophagus, Her2 negative. -He proceeded to staging CT CAP on 05/05/21 showing: esophageal mass not well visualized; indeterminate mildly-enlarged high right paratracheal node near thoracic inlet; no other mediastinal adenopathy or evidence of metastatic disease in abdomen or pelvis; stable small pulmonary nodules bilaterally. -I recommend a PET scan for further staging, which is more sensitive than CT scan, especially for evaluation of adenopathy. -I also recommend EUS for tumor staging, as part of evaluation for surgical resectability. -If his EUS shows T3/4 tumor, or PET scan shows local regional adenopathy, then the standard care is neoadjuvant chemoradiation, followed by surgical resection if it is resectable. -If he has residual disease after neoadjuvant chemoradiation, I would also recommend adjuvant nivolumab for 1 year, based on NCCN guideline. -He is scheduled to meet Dr. Isidore Moos on 05/13/21. -I discussed chemo regiment with weekly carbo and Taxol, if surgery is planned.  Benefit and side effect discussed with patient and his wife in detail.  He is interested in treatment we recommend. -I will present his case in thoracic tumor conference next week -I will refer him to thoracic surgeon Dr. Kipp Brood   2.  Dysphagia with  mild weight loss -We discussed nutrition supplement, especially with liquid nutrition, such as Ensure, boost, Carnation breakfast -Nutritional consult -We discussed that the small possibility of feeding tube if his dysphagia gets much worse after chemoradiation  3. HTN and BPH -continue medication, will monitor his blood pressure closely during treatment.  PLAN:  -PET scan next week -EUS by Dr. Paulita Fujita. I will contact [facility]  -lab, chemo class and f/u after the above work up -We will tentatively schedule his first treatment on August 1, after review with Dr. Isidore Moos  -Nutritional consult   Orders Placed This Encounter  Procedures   NM PET Image Initial (PI) Skull Base  To Thigh    Standing Status:   Future    Standing Expiration Date:   05/09/2022    Order Specific Question:   If indicated for the ordered procedure, I authorize the administration of a radiopharmaceutical per Radiology protocol    Answer:   Yes    Order Specific Question:   Preferred imaging location?    Answer:   [facility]   Ambulatory Referral to [facility]    Referral Priority:   Urgent    Referral Type:   Consultation    Referral Reason:   Specialty Services Required    Number of Visits Requested:   1    All questions were answered. The patient knows to call the clinic with any problems, questions or concerns. The total time spent in the appointment was 60 minutes.     [name], MD 05/09/2021   I, Wilburn Mylar, am acting as scribe for [name], MD.   I have reviewed the above documentation for accuracy and completeness, and I agree with the above.

## 2021-05-10 ENCOUNTER — Telehealth: Payer: Self-pay | Admitting: Nutrition

## 2021-05-10 ENCOUNTER — Other Ambulatory Visit: Payer: Self-pay | Admitting: Gastroenterology

## 2021-05-10 NOTE — Telephone Encounter (Signed)
Scheduled appt per 7/19 sch msg. Called pt, no answer. Left msg with appt. Date and time.

## 2021-05-11 ENCOUNTER — Ambulatory Visit (HOSPITAL_COMMUNITY)
Admission: RE | Admit: 2021-05-11 | Discharge: 2021-05-11 | Disposition: A | Discharge status: Home / Self Care | Payer: BC Managed Care – PPO | Attending: Gastroenterology | Admitting: Gastroenterology

## 2021-05-11 ENCOUNTER — Encounter (HOSPITAL_COMMUNITY): Payer: Self-pay | Admitting: Gastroenterology

## 2021-05-11 ENCOUNTER — Ambulatory Visit (HOSPITAL_COMMUNITY): Payer: BC Managed Care – PPO | Admitting: Certified Registered Nurse Anesthetist

## 2021-05-11 ENCOUNTER — Encounter (HOSPITAL_COMMUNITY)
Admission: RE | Disposition: A | Discharge status: Home / Self Care | Payer: Self-pay | Source: Home / Self Care | Attending: Gastroenterology

## 2021-05-11 ENCOUNTER — Other Ambulatory Visit: Payer: Self-pay

## 2021-05-11 DIAGNOSIS — Z79899 Other long term (current) drug therapy: Secondary | ICD-10-CM | POA: Diagnosis not present

## 2021-05-11 DIAGNOSIS — N400 Enlarged prostate without lower urinary tract symptoms: Secondary | ICD-10-CM | POA: Diagnosis not present

## 2021-05-11 DIAGNOSIS — K2289 Other specified disease of esophagus: Secondary | ICD-10-CM | POA: Insufficient documentation

## 2021-05-11 DIAGNOSIS — C154 Malignant neoplasm of middle third of esophagus: Secondary | ICD-10-CM | POA: Insufficient documentation

## 2021-05-11 DIAGNOSIS — Z85820 Personal history of malignant melanoma of skin: Secondary | ICD-10-CM | POA: Insufficient documentation

## 2021-05-11 DIAGNOSIS — Z7982 Long term (current) use of aspirin: Secondary | ICD-10-CM | POA: Insufficient documentation

## 2021-05-11 DIAGNOSIS — C158 Malignant neoplasm of overlapping sites of esophagus: Secondary | ICD-10-CM | POA: Diagnosis not present

## 2021-05-11 DIAGNOSIS — K219 Gastro-esophageal reflux disease without esophagitis: Secondary | ICD-10-CM | POA: Insufficient documentation

## 2021-05-11 DIAGNOSIS — I10 Essential (primary) hypertension: Secondary | ICD-10-CM | POA: Diagnosis not present

## 2021-05-11 DIAGNOSIS — C160 Malignant neoplasm of cardia: Secondary | ICD-10-CM | POA: Insufficient documentation

## 2021-05-11 DIAGNOSIS — Z87891 Personal history of nicotine dependence: Secondary | ICD-10-CM | POA: Insufficient documentation

## 2021-05-11 DIAGNOSIS — E785 Hyperlipidemia, unspecified: Secondary | ICD-10-CM | POA: Diagnosis not present

## 2021-05-11 HISTORY — PX: EUS: SHX5427

## 2021-05-11 HISTORY — PX: ESOPHAGOGASTRODUODENOSCOPY (EGD) WITH PROPOFOL: SHX5813

## 2021-05-11 HISTORY — DX: Sleep apnea, unspecified: G47.30

## 2021-05-11 SURGERY — ESOPHAGEAL ENDOSCOPIC ULTRASOUND (EUS) RADIAL
Anesthesia: Monitor Anesthesia Care

## 2021-05-11 MED ORDER — LACTATED RINGERS IV SOLN: INTRAVENOUS | Status: DC

## 2021-05-11 MED ORDER — PROPOFOL 500 MG/50ML IV EMUL
INTRAVENOUS | Status: DC | PRN
  Administered 2021-05-11: 150 ug/kg/min via INTRAVENOUS

## 2021-05-11 MED ORDER — SODIUM CHLORIDE 0.9 % IV SOLN: INTRAVENOUS | Status: DC

## 2021-05-11 MED ORDER — LIDOCAINE 2% (20 MG/ML) 5 ML SYRINGE
INTRAMUSCULAR | Status: DC | PRN
  Administered 2021-05-11: 80 mg via INTRAVENOUS

## 2021-05-11 MED ORDER — PROPOFOL 10 MG/ML IV BOLUS
INTRAVENOUS | Status: DC | PRN
  Administered 2021-05-11: 30 mg via INTRAVENOUS

## 2021-05-11 NOTE — Anesthesia Postprocedure Evaluation (Signed)
Anesthesia Post Note  Patient: Nathaniel Jennings  Procedure(s) Performed: ESOPHAGEAL ENDOSCOPIC ULTRASOUND (EUS) RADIAL ESOPHAGOGASTRODUODENOSCOPY (EGD) WITH PROPOFOL     Patient location during evaluation: PACU Anesthesia Type: MAC Level of consciousness: awake and alert Pain management: pain level controlled Vital Signs Assessment: post-procedure vital signs reviewed and stable Respiratory status: spontaneous breathing, nonlabored ventilation, respiratory function stable and patient connected to nasal cannula oxygen Cardiovascular status: stable and blood pressure returned to baseline Postop Assessment: no apparent nausea or vomiting Anesthetic complications: no   No notable events documented.  Last Vitals:  Vitals:   05/11/21 1050 05/11/21 1100  BP: (!) 122/91 (!) 156/80  Pulse: 79 61  Resp: 19 14  Temp:    SpO2: 96% 99%    Last Pain:  Vitals:   05/11/21 1100  TempSrc:   PainSc: 0-No pain                 Kule Gascoigne S

## 2021-05-11 NOTE — Transfer of Care (Signed)
Immediate Anesthesia Transfer of Care Note  Patient: Nathaniel Jennings  Procedure(s) Performed: ESOPHAGEAL ENDOSCOPIC ULTRASOUND (EUS) RADIAL ESOPHAGOGASTRODUODENOSCOPY (EGD) WITH PROPOFOL  Patient Location: Endoscopy Unit  Anesthesia Type:MAC  Level of Consciousness: awake, alert  and oriented  Airway & Oxygen Therapy: Patient Spontanous Breathing and Patient connected to face mask oxygen  Post-op Assessment: Report given to RN and Post -op Vital signs reviewed and stable  Post vital signs: Reviewed and stable  Last Vitals:  Vitals Value Taken Time  BP 122/74   Temp    Pulse 72   Resp 14   SpO2 99%     Last Pain:  Vitals:   05/11/21 0935  TempSrc: Oral  PainSc: 0-No pain         Complications: No notable events documented.

## 2021-05-11 NOTE — Op Note (Signed)
[facility] Patient Name: Derak Schurman Procedure Date: 05/11/2021 MRN: [id number] Attending MD: Arta Silence , MD Date of Birth: 07-12-1956 CSN: [id number] Age: 65 Admit Type: Outpatient Procedure:                Upper EUS Indications:              Esophageal mucosal mass/polyp found on endoscopy                            (esophageal adenocarcinoma) Providers:                Arta Silence, MD, Jobe Igo, RN, Fransico Setters                            [place], Technician Referring MD:             Dr. Burr Medico (Oncology) Medicines:                Monitored Anesthesia Care Complications:            No immediate complications. Estimated Blood Loss:     Estimated blood loss: none. Procedure:                Pre-Anesthesia Assessment:                           - Prior to the procedure, a History and Physical                            was performed, and patient medications and                            allergies were reviewed. The patient's tolerance of                            previous anesthesia was also reviewed. The risks                            and benefits of the procedure and the sedation                            options and risks were discussed with the patient.                            All questions were answered, and informed consent                            was obtained. Prior Anticoagulants: The patient has                            taken no previous anticoagulant or antiplatelet                            agents. ASA Grade Assessment: III - A patient with                            severe  systemic disease. After reviewing the risks                            and benefits, the patient was deemed in                            satisfactory condition to undergo the procedure.                           After obtaining informed consent, the endoscope was                            passed under direct vision. Throughout the                             procedure, the patient's blood pressure, pulse, and                            oxygen saturations were monitored continuously. The                            [id number] ([id number]) Olympus Radial EUS was                            introduced through the mouth, and advanced to the                            lower third of esophagus. The [id number] ([id number])                            Olympus gastroscope was introduced through the                            mouth, and advanced to the lower third of                            esophagus. The upper EUS was accomplished without                            difficulty. The patient tolerated the procedure                            well. Scope In: Scope Out: Findings:      ENDOSCOPIC FINDING: :      A single mucosal nodule with a localized distribution was found in the       middle third of the esophagus.      A large, fungating and ulcerating mass with bleeding and no stigmata of       recent bleeding was found at the gastroesophageal junction, 33 cm from       the incisors. The mass was partially obstructing and partially       circumferential (involving two thirds of the lumen circumference).      ENDOSONOGRAPHIC FINDING: :      A hypoechoic mass was found in the gastroesophageal junction. The lesion  was partially circumferential (involving 75% of the lumen). The       endosonographic borders were poorly-defined. There was sonographic       evidence suggesting invasion into and through some portions of the       muscularis propria (Layer 4). There was evidence of at least 3 discrete       peritumoral lymph nodes.      Tight GE Junction with tumor; unable to traverse the GE junction with       either the EGD scope or the radial EUS scope. Impression:               - Mucosal nodule found in the esophagus.                           - Partially obstructing, malignant esophageal tumor                            was found at the  gastroesophageal junction.                           - At least 3 discrete peritumoral lymph nodes                           - A mass was found in the gastroesophageal                            junction. A tissue diagnosis was obtained prior to                            this exam. This is of adenocarcinoma. This was                            staged T3 N2 Mx by endosonographic criteria.                           - Unable to traverse GE junction with either EGD                            scope or radial EUS scope. Moderate Sedation:      None Recommendation:           - Discharge patient to home (via wheelchair).                           - Full liquid diet until further notice.                           - Continue present medications.                           - Return to referring physician as previously                            scheduled. Procedure Code(s):        --- Professional ---  43231, Esophagoscopy, flexible, transoral; with                            endoscopic ultrasound examination Diagnosis Code(s):        --- Professional ---                           K22.8, Other specified diseases of esophagus                           C16.0, Malignant neoplasm of cardia CPT copyright 2019 [employer]. All rights reserved. The codes documented in this report are preliminary and upon coder review may  be revised to meet current compliance requirements. Arta Silence, MD 05/11/2021 10:49:50 AM This report has been signed electronically. Number of Addenda: 0

## 2021-05-11 NOTE — Anesthesia Preprocedure Evaluation (Signed)
Anesthesia Evaluation  Patient identified by MRN, date of birth, ID band Patient awake    Reviewed: Allergy & Precautions, NPO status , Patient's Chart, lab work & pertinent test results  Airway Mallampati: II  TM Distance: >3 FB Neck ROM: Full    Dental no notable dental hx.    Pulmonary neg pulmonary ROS, former smoker,    Pulmonary exam normal breath sounds clear to auscultation       Cardiovascular hypertension, Normal cardiovascular exam Rhythm:Regular Rate:Normal     Neuro/Psych negative neurological ROS  negative psych ROS   GI/Hepatic (+)     substance abuse  alcohol use, Esophageal cancer   Endo/Other  negative endocrine ROS  Renal/GU negative Renal ROS  negative genitourinary   Musculoskeletal negative musculoskeletal ROS (+)   Abdominal   Peds negative pediatric ROS (+)  Hematology negative hematology ROS (+)   Anesthesia Other Findings   Reproductive/Obstetrics negative OB ROS                             Anesthesia Physical Anesthesia Plan  ASA: 3  Anesthesia Plan: MAC   Post-op Pain Management:    Induction: Intravenous  PONV Risk Score and Plan: 1 and Propofol infusion  Airway Management Planned: Simple Face Mask  Additional Equipment:   Intra-op Plan:   Post-operative Plan:   Informed Consent: I have reviewed the patients History and Physical, chart, labs and discussed the procedure including the risks, benefits and alternatives for the proposed anesthesia with the patient or authorized representative who has indicated his/her understanding and acceptance.     Dental advisory given  Plan Discussed with: CRNA and Surgeon  Anesthesia Plan Comments:         Anesthesia Quick Evaluation

## 2021-05-11 NOTE — Progress Notes (Signed)
I left vm for Nathaniel Jennings letting him know that his PET scan has been moved to [place] location and his new appt is 8/2 at 1330 arrive at 1300 npo after 0530.

## 2021-05-11 NOTE — Discharge Instructions (Signed)

## 2021-05-11 NOTE — Interval H&P Note (Signed)
History and Physical Interval Note:  05/11/2021 10:03 AM  Nathaniel Jennings  has presented today for surgery, with the diagnosis of esophageal cancer staging.  The various methods of treatment have been discussed with the patient and family. After consideration of risks, benefits and other options for treatment, the patient has consented to  Procedure(s): ESOPHAGEAL ENDOSCOPIC ULTRASOUND (EUS) RADIAL (N/A) as a surgical intervention.  The patient's history has been reviewed, patient examined, no change in status, stable for surgery.  I have reviewed the patient's chart and labs.  Questions were answered to the patient's satisfaction.     [name]

## 2021-05-12 NOTE — Progress Notes (Signed)
Radiation Oncology         [phone] ________________________________  Initial Outpatient Consultation  Name: Nathaniel Jennings MRN: [id number]  Date: 05/13/2021  DOB: [birth year]  WU:[id number], Nathaniel Kuba, MD   REFERRING PHYSICIAN: [name], MD  DIAGNOSIS:    ICD-10-CM   1. Malignant neoplasm of overlapping sites of esophagus [facility])  C15.8 Ambulatory referral to Social Work    TSH    Ambulatory referral to Social Work    Ambulatory referral to Social Work    2. Loss of weight  R63.4 TSH    Cancer Staging Malignant neoplasm of overlapping sites of esophagus [facility]) Staging form: Esophagus - Adenocarcinoma, AJCC 8th Edition - Clinical stage from 05/13/2021: cT3, cN2 - Unsigned     CHIEF COMPLAINT: Here to discuss management of esophageal cancer  HISTORY OF PRESENT ILLNESS::Nathaniel Jennings is a 65 y.o. male who presented to his primary care provider with increased dysphagia in the setting of known GERD this past early May. The patient stated that he had more difficulty swallowing in the morning, which was followed by regurgitation after eating.   Accordingly, the patient was referred to [facility] on 04/14/21. Upon physical examination, the patient was found to have a palpable right sided neck lymphadenopathy. Neck ultrasound taken on 04/18/21 showed no abnormal findings  Subsequently, the patient saw Dr. Alessandra Bevels on 04/19/21 who performed an EGD. Findings revealed polyps in the mid esophagus and a likely malignant distal esophageal mass (noted to be possibly obstructive).  Biopsy of the esophageal polyps and mass revealed: esophageal adenocarcinoma.  Pertinent imaging thus far includes CT of the chest abdomen and pelvis performed on 05/05/21 revealing: the known esophageal mass, mild-irregular wall thickening of the distal esophagus, and indeterminate mildly-enlarged high right paratracheal node near the thoracic inlet. No mediastinal adenopathy or metastatic disease was  seen in the abdomen or pelvis. Some small bilateral pulmonary nodules additionally visualized were noted to be stable and benign.   The patient underwent upper esophageal endoscopic ultrasound on 05/11/21 under Dr. Paulita Fujita.  A single mucosal nodule with a localized distribution was found in the middle third of the esophagus.    Endoscopic finding: A large,  fungating and ulcerating mass with bleeding and no stigmata of    recent bleeding was found at the gastroesophageal junction, 33 cm from the incisors. The mass was partially obstructing and partially circumferential (involving two thirds of the lumen circumference).      ENDOSONOGRAPHIC FINDING:     A hypoechoic mass was found in the gastroesophageal junction. The lesion  was partially circumferential (involving 75% of the lumen). The endosonographic borders were poorly-defined. There was sonographic evidence suggesting invasion into and through some portions of the muscularis propria (Layer 4). There was evidence of at least 3 discrete  peritumoral lymph nodes.Tight GE Junction with tumor; unable to traverse the GE junction with either the EGD scope or the radial EUS scope.  Impression:             - Mucosal nodule found in the esophagus.                           - Partially obstructing, malignant esophageal tumor                           was found at the gastroesophageal junction.                           -  At least 3 discrete peritumoral lymph nodes                           - A mass was found in the gastroesophageal                           junction. A tissue diagnosis was obtained prior to                           this exam. This is of adenocarcinoma. This was                           staged T3 N2 Mx by endosonographic criteria.                           - Unable to traverse GE junction with either EGD   Swallowing issues, if any: reports dysphagia (manageable with diet modification), denies odynophagia   Weight Changes: He has  lost 4-5 lbs in the past 3 months (currently on a soft diet which he tolerates well)  Pain status: N/A  Tobacco history, if any: Former smoker; 2 packs per day for 42 years; quit in May of 2012. Currently uses snuff  ETOH abuse, if any: 24 standard drinks per week  Of note: had 2 negative prostate biopsies in 2016 and 2017 (BPH), and a melanoma excision in July of 2011 (non malignant).  He denies any palpable masses in the cervical or supraclavicular neck.  He denies recent smoking.  He drinks 3-5 alcoholic drinks on average daily and has a chronic history of gastroesophageal reflux.  He takes pantoprazole for this.  He works in [employer]  PREVIOUS RADIATION THERAPY: No  PAST MEDICAL HISTORY:  has a past medical history of BPH (benign prostatic hyperplasia), Dyslipidemia, Elevated PSA, Esophageal cancer ([place]), Full dentures, History of melanoma excision, History of palpitations, Hypertension, and Sleep apnea.    PAST SURGICAL HISTORY: Past Surgical History:  Procedure Laterality Date   COLONOSCOPY  2009   ESOPHAGOGASTRODUODENOSCOPY (EGD) WITH PROPOFOL N/A 05/11/2021   Procedure: ESOPHAGOGASTRODUODENOSCOPY (EGD) WITH PROPOFOL;  Surgeon: Arta Silence, MD;  Location: [facility];  Service: Endoscopy;  Laterality: N/A;   EUS N/A 05/11/2021   Procedure: ESOPHAGEAL ENDOSCOPIC ULTRASOUND (EUS) RADIAL;  Surgeon: Arta Silence, MD;  Location: [facility];  Service: Endoscopy;  Laterality: N/A;   PROSTATE BIOPSY N/A 07/02/2015   Procedure: BIOPSY TRANSRECTAL ULTRASONIC PROSTATE (TUBP);  Surgeon: Lowella Bandy, MD;  Location: [facility];  Service: Urology;  Laterality: N/A;   PROSTATE BIOPSY N/A 11/22/2015   Procedure: BIOPSY TRANSRECTAL ULTRASONIC PROSTATE (TUBP);  Surgeon: Cleon Gustin, MD;  Location: [facility];  Service: Urology;  Laterality: N/A;   TRANSTHORACIC ECHOCARDIOGRAM  11-19-2012   normal echo/  trivial TR   WIDE EXCISION MELANOMA LEFT BACK   05-02-2010    FAMILY HISTORY: family history includes Kidney failure in his mother; Pneumonia in his father.  SOCIAL HISTORY:  reports that he quit smoking about 10 years ago. His smoking use included cigarettes. He has a 84.00 pack-year smoking history. His smokeless tobacco use includes snuff. He reports current alcohol use of about 24.0 standard drinks of alcohol per week.  ALLERGIES: Bee venom  MEDICATIONS:  Current Outpatient Medications  Medication Sig Dispense Refill   aspirin  EC 81 MG tablet Take 81 mg by mouth in the morning.     Krill Oil 500 MG CAPS Take 500 mg by mouth in the morning.     metoprolol succinate (TOPROL-XL) 100 MG 24 hr tablet Take 100 mg by mouth in the morning.     Multiple Vitamin (MULTIVITAMIN WITH MINERALS) TABS tablet Take 1 tablet by mouth in the morning. Centrum Silver     pantoprazole (PROTONIX) 40 MG tablet Take 40 mg by mouth 2 (two) times daily.     No current facility-administered medications for this encounter.    REVIEW OF SYSTEMS:  Notable for that above.   PHYSICAL EXAM:  height is 5' 6.5" (1.689 m) and weight is 167 lb (75.8 kg). His temperature is 98 F (36.7 C). His blood pressure is 141/81 (abnormal) and his pulse is 66. His respiration is 18 and oxygen saturation is 99%.   General: Alert and oriented, in no acute distress HEENT: Head is normocephalic. Extraocular movements are intact. Oropharynx is notable for no lesions. Neck: Neck is notable for no palpable adenopathy in the cervical or supraclavicular regions Heart: Regular in rate and rhythm with no murmurs, rubs, or gallops. Chest: Clear to auscultation bilaterally, with no rhonchi, wheezes, or rales. Abdomen: Soft, nontender, nondistended, with no rigidity or guarding. Extremities: No cyanosis or edema. Lymphatics: see Neck Exam Skin: Nonspecific eczematous rash on lower leg Musculoskeletal: symmetric strength and muscle tone throughout. Neurologic: Cranial nerves II through XII  are grossly intact. No obvious focalities. Speech is fluent. Coordination is intact. Psychiatric: Judgment and insight are intact. Affect is appropriate.   ECOG = 1  0 - Asymptomatic (Fully active, able to carry on all predisease activities without restriction)  1 - Symptomatic but completely ambulatory (Restricted in physically strenuous activity but ambulatory and able to carry out work of a light or sedentary nature. For example, light housework, office work)  2 - Symptomatic, <50% in bed during the day (Ambulatory and capable of all self care but unable to carry out any work activities. Up and about more than 50% of waking hours)  3 - Symptomatic, >50% in bed, but not bedbound (Capable of only limited self-care, confined to bed or chair 50% or more of waking hours)  4 - Bedbound (Completely disabled. Cannot carry on any self-care. Totally confined to bed or chair)  5 - Death   Eustace Pen MM, Creech RH, Tormey DC, et al. [phone]). "Toxicity and response criteria of the [facility]". [place] Oncol. 5 (6): 649-55   LABORATORY DATA:  Lab Results  Component Value Date   WBC 3.9 (L) 05/13/2021   HGB 15.0 05/13/2021   HCT 40.3 05/13/2021   MCV 93.7 05/13/2021   PLT 207 05/13/2021   CMP     Component Value Date/Time   NA 142 05/13/2021 1007   K 4.0 05/13/2021 1007   CL 104 05/13/2021 1007   CO2 28 05/13/2021 1007   GLUCOSE 97 05/13/2021 1007   BUN 13 05/13/2021 1007   CREATININE 0.86 05/13/2021 1007   CALCIUM 10.2 05/13/2021 1007   PROT 7.5 05/13/2021 1007   ALBUMIN 4.5 05/13/2021 1007   AST 26 05/13/2021 1007   ALT 37 05/13/2021 1007   ALKPHOS 59 05/13/2021 1007   BILITOT 0.7 05/13/2021 1007   GFRNONAA >60 05/13/2021 1007   GFRAA  04/28/2010 1030    >60        The eGFR has been calculated using the MDRD equation.  This calculation has not been validated in all clinical situations. eGFR's persistently <60 mL/min signify possible Chronic Kidney  Disease.      No results found for: TSH   RADIOGRAPHY: US SOFT TISSUE HEAD & NECK (NON-THYROID)  Result Date: 04/18/2021 CLINICAL DATA:  Dysphagia, palpable abnormality EXAM: ULTRASOUND OF HEAD/NECK SOFT TISSUES TECHNIQUE: Ultrasound examination of the head and neck soft tissues was performed in the area of clinical concern. COMPARISON:  None. FINDINGS: Sonographic interrogation of the right and left neck demonstrates normal subcutaneous fat, should muscles and vascular structures. No evidence of abnormal lymphadenopathy. No solid or cystic masses. IMPRESSION: No suspicious lymphadenopathy. Electronically Signed   By: Jacqulynn Cadet M.D.   On: 04/18/2021 11:37   CT CHEST ABDOMEN PELVIS W CONTRAST  Result Date: 05/07/2021 CLINICAL DATA:  Esophageal mass. Creatinine was obtained on site at [place] at [address] Results: Creatinine 1.0 mg/dL. EXAM: CT CHEST, ABDOMEN, AND PELVIS WITH CONTRAST TECHNIQUE: Multidetector CT imaging of the chest, abdomen and pelvis was performed following the standard protocol during bolus administration of intravenous contrast. CONTRAST:  144mL ISOVUE-300 IOPAMIDOL (ISOVUE-300) INJECTION 61% COMPARISON:  CT angiography of the chest and abdomen 11/21/2007 FINDINGS: CT CHEST FINDINGS Cardiovascular: No acute vascular findings. Minimal aortic atherosclerosis. The heart size is normal. There is no pericardial effusion. Mediastinum/Nodes: Mild wall thickening and irregularity of the distal esophagus without obvious focal mass lesion. The proximal esophagus appears normal. However, there is a 1.2 cm nodule in the high right tracheoesophageal groove (image 7/2). No other enlarged mediastinal, hilar or axillary lymph nodes are identified. The thyroid gland and trachea appear normal. Lungs/Pleura: No pleural effusion or pneumothorax. Stable 5 mm right lower lobe nodule on image 107/4. Stable 3 mm left upper lobe nodule on image 70/4. No new or enlarging nodules.  Musculoskeletal/Chest wall: No chest wall mass or suspicious osseous findings. Old right scapular and rib fractures. The right 1st and 2nd ribs are partially ankylosed. CT ABDOMEN AND PELVIS FINDINGS Hepatobiliary: The liver is normal in density without suspicious focal abnormality. Small gallstone at the gallbladder fundus. No evidence of gallbladder wall thickening or biliary dilatation. Pancreas: Unremarkable. No pancreatic ductal dilatation or surrounding inflammatory changes. Spleen: Normal in size without focal abnormality. Adrenals/Urinary Tract: Both adrenal glands appear normal. Small renal cysts bilaterally. No evidence of urinary tract calculus or hydronephrosis. The bladder appears unremarkable. Stomach/Bowel: Enteric contrast was administered and has passed to the splenic flexure of the colon. The stomach appears unremarkable for its degree of distension. No evidence of bowel wall thickening, distention or surrounding inflammatory change. The appendix appears normal. Incidental diverticulum of the 3rd portion of the duodenum. Vascular/Lymphatic: There are no enlarged abdominal or pelvic lymph nodes. Small lymph nodes in the gastrohepatic ligament and porta hepatis are unchanged from the remote study. Mild aortic and branch vessel atherosclerosis without aneurysm or acute vascular findings. Reproductive: The prostate gland is moderately enlarged. Other: No evidence of abdominal wall mass or hernia. No ascites. Musculoskeletal: No acute or significant osseous findings. T12 hemangioma noted. IMPRESSION: 1. The patient's known esophageal mass is not well visualized. There is mild irregular wall thickening of the distal esophagus. Correlate with previous clinical evaluation. 2. Indeterminate mildly enlarged high right paratracheal node near the thoracic inlet. No other mediastinal adenopathy. 3. No evidence of metastatic disease in the abdomen or pelvis. 4. Stable small pulmonary nodules bilaterally  consistent with benign findings. 5. Cholelithiasis, small renal cysts and Aortic Atherosclerosis (ICD10-I70.0). Electronically Signed   By:  Richardean Sale M.D.   On: 05/07/2021 14:51      IMPRESSION/PLAN:  This is a delightful patient with mid and lower esophageal cancer, adenocarcinoma, locally advanced.  PET scan is pending and has been moved up to take place on July 25.  A multidisciplinary discussion has taken place and he has been deemed a potential candidate for surgery.  Chemoradiation preoperatively may facilitate surgery and give him the best chance of local regional control.  Barring distant metastatic disease on his PET, I recommend 28 treatments of radiotherapy for this patient.   We discussed the potential risks, benefits, and side effects of radiotherapy. We talked in detail about acute and late effects. We discussed that some of the most bothersome acute effects may be mucositis, esophagitis, skin irritation, hair loss, dehydration, weight loss and fatigue. We talked about late effects which include but are not necessarily limited to dysphagia, pneumonitis, cardiac injury, hypothyroidism, nerve injury, vascular injury, spinal cord injury, bowel injury, stomach injury, kidney injury, and potential injury to any of the tissues in the chest and abdomen region. No guarantees of treatment were given. A consent form was signed and placed in the patient's medical record. The patient is enthusiastic about proceeding with treatment. I look forward to participating in the patient's care.    Simulation (treatment planning) will take place today and we anticipate starting treatment August 1   Advised to cut back on drinking and taper over the next 3 weeks before abstaining completely, he understands that alcohol is carcinogenic and may have played a role in causing his cancer.  He will continue to take pantoprazole for his GERD.  On date of service, in total, I spent 60 minutes on this encounter.  Patient was seen in person.  __________________________________________   Eppie Gibson, MD  This document serves as a record of services personally performed by Eppie Gibson, MD. It was created on her behalf by Roney Mans, a trained medical scribe. The creation of this record is based on the scribe's personal observations and the provider's statements to them. This document has been checked and approved by the attending provider.

## 2021-05-12 NOTE — Progress Notes (Signed)
GI Location of Tumor / Histology:  Malignant neoplasm of overlapping sites of esophagus   Nathaniel Jennings presented with symptoms of: presented to his PCP in early May with increasing dysphagia and known GERD. He was referred to [facility] GI and found to have a palpable right-sided neck lymphadenopathy. He underwent an EGD under Dr. Alessandra Bevels on 04/19/2021, which revealed polyps/mass in mid and distal esophagus. Biopsy of poly/mass was obtained and pathology confirmed esophageal adenocarcinoma  CT C/A/P w/ Contrast --05/05/2021   Biopsies revealed:  04/19/2021   Past/Anticipated interventions by surgeon, if any:  04/19/2021 Dr. Otis Brace [facility]) Upper GI Endoscopy   Past/Anticipated interventions by medical oncology, if any:  Under care of Dr. [name] 05/09/2021  Weight changes, if any: Patient denies  Bowel/Bladder complaints, if any: Patient denies any changes or new urinary concerns (reports he has an enlarged prostate that is being monitored by his urologist Dr. Claudia Desanctis at [facility]). Denies any changes or concerns in bowel habits (reports daily bowel movements without issue)  Nausea / Vomiting, if any: Patient denies  Pain issues, if any:  Patient denies  Any blood per rectum:   Patient denies  SAFETY ISSUES: Prior radiation? No Pacemaker/ICD? No Possible current pregnancy? N/A Is the patient on methotrexate? No  Current Complaints/Details: Scheduled for PET scan on 05/24/2021. Patient has received the first 3 Pfizer vaccines, and was told to wait to receive booster vaccine until after PET scan

## 2021-05-13 ENCOUNTER — Inpatient Hospital Stay: Payer: BC Managed Care – PPO

## 2021-05-13 ENCOUNTER — Ambulatory Visit
Admission: RE | Admit: 2021-05-13 | Discharge: 2021-05-13 | Disposition: A | Discharge status: Home / Self Care | Payer: BC Managed Care – PPO | Source: Ambulatory Visit | Attending: Radiation Oncology | Admitting: Radiation Oncology

## 2021-05-13 ENCOUNTER — Encounter: Payer: Self-pay | Admitting: Hematology

## 2021-05-13 ENCOUNTER — Ambulatory Visit
Admission: RE | Admit: 2021-05-13 | Payer: BC Managed Care – PPO | Source: Ambulatory Visit | Admitting: Radiation Oncology

## 2021-05-13 ENCOUNTER — Telehealth: Payer: Self-pay | Admitting: Hematology

## 2021-05-13 ENCOUNTER — Other Ambulatory Visit: Payer: Self-pay

## 2021-05-13 ENCOUNTER — Encounter: Payer: Self-pay | Admitting: Radiation Oncology

## 2021-05-13 ENCOUNTER — Encounter: Payer: Self-pay | Admitting: General Practice

## 2021-05-13 VITALS — BP 141/81 | HR 66 | Temp 98.0°F | Resp 18 | Ht 66.5 in | Wt 167.0 lb

## 2021-05-13 DIAGNOSIS — E785 Hyperlipidemia, unspecified: Secondary | ICD-10-CM | POA: Insufficient documentation

## 2021-05-13 DIAGNOSIS — Z7982 Long term (current) use of aspirin: Secondary | ICD-10-CM | POA: Insufficient documentation

## 2021-05-13 DIAGNOSIS — R918 Other nonspecific abnormal finding of lung field: Secondary | ICD-10-CM | POA: Diagnosis not present

## 2021-05-13 DIAGNOSIS — Z510 Encounter for antineoplastic radiation therapy: Secondary | ICD-10-CM | POA: Diagnosis not present

## 2021-05-13 DIAGNOSIS — R634 Abnormal weight loss: Secondary | ICD-10-CM | POA: Diagnosis not present

## 2021-05-13 DIAGNOSIS — I10 Essential (primary) hypertension: Secondary | ICD-10-CM | POA: Insufficient documentation

## 2021-05-13 DIAGNOSIS — K219 Gastro-esophageal reflux disease without esophagitis: Secondary | ICD-10-CM | POA: Insufficient documentation

## 2021-05-13 DIAGNOSIS — C158 Malignant neoplasm of overlapping sites of esophagus: Secondary | ICD-10-CM | POA: Insufficient documentation

## 2021-05-13 DIAGNOSIS — G4730 Sleep apnea, unspecified: Secondary | ICD-10-CM | POA: Diagnosis not present

## 2021-05-13 DIAGNOSIS — Z79899 Other long term (current) drug therapy: Secondary | ICD-10-CM | POA: Insufficient documentation

## 2021-05-13 DIAGNOSIS — N400 Enlarged prostate without lower urinary tract symptoms: Secondary | ICD-10-CM | POA: Insufficient documentation

## 2021-05-13 DIAGNOSIS — F1010 Alcohol abuse, uncomplicated: Secondary | ICD-10-CM | POA: Diagnosis not present

## 2021-05-13 DIAGNOSIS — K2281 Esophageal polyp: Secondary | ICD-10-CM | POA: Diagnosis not present

## 2021-05-13 DIAGNOSIS — R972 Elevated prostate specific antigen [PSA]: Secondary | ICD-10-CM | POA: Diagnosis not present

## 2021-05-13 DIAGNOSIS — Z85820 Personal history of malignant melanoma of skin: Secondary | ICD-10-CM | POA: Diagnosis not present

## 2021-05-13 DIAGNOSIS — Z87891 Personal history of nicotine dependence: Secondary | ICD-10-CM | POA: Insufficient documentation

## 2021-05-13 LAB — CMP (CANCER CENTER ONLY)
ALT: 37 U/L (ref 0–44)
AST: 26 U/L (ref 15–41)
Albumin: 4.5 g/dL (ref 3.5–5.0)
Alkaline Phosphatase: 59 U/L (ref 38–126)
Anion gap: 10 (ref 5–15)
BUN: 13 mg/dL (ref 8–23)
CO2: 28 mmol/L (ref 22–32)
Calcium: 10.2 mg/dL (ref 8.9–10.3)
Chloride: 104 mmol/L (ref 98–111)
Creatinine: 0.86 mg/dL (ref 0.61–1.24)
GFR, Estimated: >60 mL/min (ref >60)
Glucose, Bld: 97 mg/dL (ref 70–99)
Potassium: 4 mmol/L (ref 3.5–5.1)
Sodium: 142 mmol/L (ref 135–145)
Total Bilirubin: 0.7 mg/dL (ref 0.3–1.2)
Total Protein: 7.5 g/dL (ref 6.5–8.1)

## 2021-05-13 LAB — CEA (IN HOUSE-CHCC): CEA (CHCC-In House): 1.09 ng/mL (ref 0.00–5.00)

## 2021-05-13 LAB — CBC WITH DIFFERENTIAL (CANCER CENTER ONLY)
Abs Immature Granulocytes: 0.02 10*3/uL (ref 0.00–0.07)
Basophils Absolute: 0.1 10*3/uL (ref 0.0–0.1)
Basophils Relative: 1 %
Eosinophils Absolute: 0.1 10*3/uL (ref 0.0–0.5)
Eosinophils Relative: 3 %
HCT: 40.3 % (ref 39.0–52.0)
Hemoglobin: 15 g/dL (ref 13.0–17.0)
Immature Granulocytes: 1 %
Lymphocytes Relative: 23 %
Lymphs Abs: 0.9 10*3/uL (ref 0.7–4.0)
MCH: 34.9 pg — ABNORMAL HIGH (ref 26.0–34.0)
MCHC: 37.2 g/dL — ABNORMAL HIGH (ref 30.0–36.0)
MCV: 93.7 fL (ref 80.0–100.0)
Monocytes Absolute: 0.5 10*3/uL (ref 0.1–1.0)
Monocytes Relative: 12 %
Neutro Abs: 2.4 10*3/uL (ref 1.7–7.7)
Neutrophils Relative %: 60 %
Platelet Count: 207 10*3/uL (ref 150–400)
RBC: 4.3 MIL/uL (ref 4.22–5.81)
RDW: 11.6 % (ref 11.5–15.5)
WBC Count: 3.9 10*3/uL — ABNORMAL LOW (ref 4.0–10.5)
nRBC: 0 % (ref 0.0–0.2)

## 2021-05-13 LAB — FERRITIN: Ferritin: 93 ng/mL (ref 24–336)

## 2021-05-13 NOTE — Progress Notes (Signed)
[facility] Psychosocial Distress Screening Clinical Social Work  Clinical Social Work was referred by distress screening protocol.  The patient scored a 7 on the Psychosocial Distress Thermometer which indicates moderate distress. Clinical Social Worker contacted patient by phone to assess for distress and other psychosocial needs. Called patient to follow up on Distress Screen, no answer left VM w my contact information and encouragement to call back.    ONCBCN DISTRESS SCREENING 05/13/2021  Screening Type Initial Screening  Distress experienced in past week (1-10) 7  Emotional problem type Nervousness/Anxiety;Adjusting to illness  Physician notified of physical symptoms No  Referral to clinical psychology No  Referral to clinical social work Yes  Referral to dietition Yes  Referral to financial advocate No  Referral to support programs Yes  Referral to palliative care No    Clinical Social Worker follow up needed: No.  If yes, follow up plan:  Beverely Pace, [place], LCSW Clinical Social Worker Phone:  [phone]

## 2021-05-13 NOTE — Telephone Encounter (Signed)
Scheduled appointment per 07/22 sch msg. Patient will receive updated calender.

## 2021-05-16 ENCOUNTER — Encounter
Admission: RE | Admit: 2021-05-16 | Discharge: 2021-05-16 | Disposition: A | Discharge status: Home / Self Care | Payer: BC Managed Care – PPO | Source: Ambulatory Visit | Attending: Hematology | Admitting: Hematology

## 2021-05-16 ENCOUNTER — Telehealth: Payer: Self-pay | Admitting: *Deleted

## 2021-05-16 ENCOUNTER — Other Ambulatory Visit: Payer: Self-pay

## 2021-05-16 DIAGNOSIS — C159 Malignant neoplasm of esophagus, unspecified: Secondary | ICD-10-CM | POA: Diagnosis not present

## 2021-05-16 DIAGNOSIS — C158 Malignant neoplasm of overlapping sites of esophagus: Secondary | ICD-10-CM | POA: Diagnosis not present

## 2021-05-16 LAB — GLUCOSE, CAPILLARY: Glucose-Capillary: 94 mg/dL (ref 70–99)

## 2021-05-16 MED ORDER — FLUDEOXYGLUCOSE F - 18 (FDG) INJECTION
8.7000 | Freq: Once | INTRAVENOUS | Status: AC | PRN
  Administered 2021-05-16: 9.1 via INTRAVENOUS

## 2021-05-16 NOTE — Progress Notes (Signed)
Pharmacist Chemotherapy Monitoring - Initial Assessment    Anticipated start date: 05/24/21   The following has been reviewed per standard work regarding the patient's treatment regimen: The patient's diagnosis, treatment plan and drug doses, and organ/hematologic function Lab orders and baseline tests specific to treatment regimen  The treatment plan start date, drug sequencing, and pre-medications Prior authorization status  Patient's documented medication list, including drug-drug interaction screen and prescriptions for anti-emetics and supportive care specific to the treatment regimen The drug concentrations, fluid compatibility, administration routes, and timing of the medications to be used The patient's access for treatment and lifetime cumulative dose history, if applicable  The patient's medication allergies and previous infusion related reactions, if applicable   Changes made to treatment plan:  treatment plan date  Follow up needed:  Pending PA   Wynona Neat, [facility], 05/16/2021  8:37 PM

## 2021-05-16 NOTE — Telephone Encounter (Signed)
CALLED PATIENT TO INFORM OF LAB APPT. FOR 05-17-21 @ 10 AM, SPOKE WITH PATIENT AND HE IS AWARE OF THIS APPT. AND GOOD WITH THIS APPT.

## 2021-05-17 ENCOUNTER — Inpatient Hospital Stay: Payer: BC Managed Care – PPO | Admitting: Nutrition

## 2021-05-17 ENCOUNTER — Ambulatory Visit
Admission: RE | Admit: 2021-05-17 | Discharge: 2021-05-17 | Disposition: A | Discharge status: Home / Self Care | Payer: BC Managed Care – PPO | Source: Ambulatory Visit | Attending: Radiation Oncology | Admitting: Radiation Oncology

## 2021-05-17 DIAGNOSIS — R634 Abnormal weight loss: Secondary | ICD-10-CM | POA: Diagnosis not present

## 2021-05-17 DIAGNOSIS — C158 Malignant neoplasm of overlapping sites of esophagus: Secondary | ICD-10-CM | POA: Diagnosis not present

## 2021-05-17 DIAGNOSIS — Z510 Encounter for antineoplastic radiation therapy: Secondary | ICD-10-CM | POA: Diagnosis not present

## 2021-05-17 LAB — TSH: TSH: 0.872 u[IU]/mL (ref 0.320–4.118)

## 2021-05-17 NOTE — Progress Notes (Signed)
65 year old male diagnosed with esophageal cancer to received concurrent chemoradiation therapy and be followed by Dr. Burr Medico and Dr. Isidore Moos.  He is scheduled for Carbo/Taxol and 28 fractions of radiation therapy.  Past medical history includes tobacco and alcohol, GERD, dyslipidemia, and hypertension.  Medications include MVI and Protonix.  Labs were reviewed.  Height: 5 feet 6 inches. Weight: 167 pounds. Usual body weight: 180 pounds February 09, 2021. BMI: 26.76. ECOG: 1  Patient reports he cannot swallow solid foods at all.  He tolerates all liquids and most blenderized foods if he can keep them mostly liquid. Reports drinking 1 high-protein Ensure a day.   Reports episodes of vomiting after eating food that gets caught in his esophagus. He has full dentures but denies difficulty chewing. He is positive for 7% weight loss over 3 months which is not significant but it is concerning.  Nutrition diagnosis: Unintended weight loss related to esophageal cancer and dysphagia as evidenced by 13 pound weight loss over 4 months.  Intervention: Patient educated to continue liquids as tolerated. Recommended patient change oral nutrition supplements to Ensure Plus or equivalent to provide added calories and protein.  I provided samples.  Also gave patient coupons. Reviewed blenderized foods and adding additional liquid to altered consistency as needed. Recommended oral nutrition supplements 3 times daily. Encourage smaller more frequent meals and snacks throughout the day. Maintain current weight. Questions were answered.  Teach back method used.  Contact information given.  Monitoring, evaluation, goals: Patient will tolerate adequate calories and protein to minimize further weight loss.  Next visit:Tuesday, August 2 during infusion.  **Disclaimer: This note was dictated with voice recognition software. Similar sounding words can inadvertently be transcribed and this note may contain  transcription errors which may not have been corrected upon publication of note.**

## 2021-05-18 ENCOUNTER — Other Ambulatory Visit: Payer: Self-pay

## 2021-05-18 ENCOUNTER — Inpatient Hospital Stay: Payer: BC Managed Care – PPO

## 2021-05-18 ENCOUNTER — Encounter: Payer: Self-pay | Admitting: General Practice

## 2021-05-18 NOTE — Progress Notes (Signed)
[place] Psychosocial Distress Screening Clinical Social Work  Clinical Social Work was referred by distress screening protocol.  The patient scored a 7 on the Psychosocial Distress Thermometer which indicates moderate distress. Clinical Social Worker contacted patient by phone to assess for distress and other psychosocial needs. He is at work, no current concerns.  He has all the paperwork needed.  "They are keeping me very busy, that's a good thing."  Anxious to "get things started and get after this."  Briefly described resources available through [facility] and encouraged him to access [place] as needed.    ONCBCN DISTRESS SCREENING 05/13/2021  Screening Type Initial Screening  Distress experienced in past week (1-10) 7  Emotional problem type Nervousness/Anxiety;Adjusting to illness  Physician notified of physical symptoms No  Referral to clinical psychology No  Referral to clinical social work Yes  Referral to dietition Yes  Referral to financial advocate No  Referral to support programs Yes  Referral to palliative care No    Clinical Social Worker follow up needed: No  If yes, follow up plan:  Beverely Pace, [place], LCSW Clinical Social Worker Phone:  [phone]

## 2021-05-19 ENCOUNTER — Other Ambulatory Visit: Payer: Self-pay | Admitting: *Deleted

## 2021-05-19 NOTE — Progress Notes (Signed)
[name], called stating Nathaniel Jennings received a call cancelling an appt this coming Monday 05/23/2021.  They also said Dr Burr Medico appt was cancelled.  I verified with Dr Burr Medico she will see him on Monday after his lab appt. The only appt that has been canceled is his first radiation therapy treatment which will begin on Tuesday.  I reviewed his schedule with her and she verbalized understanding.

## 2021-05-19 NOTE — Progress Notes (Signed)
The proposed treatment discussed in cancer conference is for discussion purpose only and is not a binding recommendation. The patient was not physically examined nor present for their treatment options. Therefore, final treatment plans cannot be decided.  ?

## 2021-05-20 DIAGNOSIS — Z510 Encounter for antineoplastic radiation therapy: Secondary | ICD-10-CM | POA: Diagnosis not present

## 2021-05-20 DIAGNOSIS — R634 Abnormal weight loss: Secondary | ICD-10-CM | POA: Diagnosis not present

## 2021-05-20 DIAGNOSIS — C158 Malignant neoplasm of overlapping sites of esophagus: Secondary | ICD-10-CM | POA: Diagnosis not present

## 2021-05-23 ENCOUNTER — Inpatient Hospital Stay: Payer: BC Managed Care – PPO | Attending: Hematology

## 2021-05-23 ENCOUNTER — Other Ambulatory Visit: Payer: Self-pay

## 2021-05-23 ENCOUNTER — Telehealth: Payer: Self-pay | Admitting: Pulmonary Disease

## 2021-05-23 ENCOUNTER — Ambulatory Visit: Payer: BC Managed Care – PPO | Admitting: Radiation Oncology

## 2021-05-23 ENCOUNTER — Other Ambulatory Visit: Payer: Self-pay | Admitting: Pulmonary Disease

## 2021-05-23 ENCOUNTER — Telehealth: Payer: Self-pay

## 2021-05-23 ENCOUNTER — Encounter: Payer: Self-pay | Admitting: Hematology

## 2021-05-23 ENCOUNTER — Inpatient Hospital Stay: Payer: BC Managed Care – PPO | Admitting: Hematology

## 2021-05-23 VITALS — BP 146/85 | HR 64 | Temp 98.4°F | Resp 19 | Ht 66.5 in | Wt 168.9 lb

## 2021-05-23 DIAGNOSIS — Z85820 Personal history of malignant melanoma of skin: Secondary | ICD-10-CM | POA: Diagnosis not present

## 2021-05-23 DIAGNOSIS — R1310 Dysphagia, unspecified: Secondary | ICD-10-CM | POA: Diagnosis not present

## 2021-05-23 DIAGNOSIS — Z5111 Encounter for antineoplastic chemotherapy: Secondary | ICD-10-CM | POA: Insufficient documentation

## 2021-05-23 DIAGNOSIS — E041 Nontoxic single thyroid nodule: Secondary | ICD-10-CM | POA: Insufficient documentation

## 2021-05-23 DIAGNOSIS — Z79899 Other long term (current) drug therapy: Secondary | ICD-10-CM | POA: Insufficient documentation

## 2021-05-23 DIAGNOSIS — C158 Malignant neoplasm of overlapping sites of esophagus: Secondary | ICD-10-CM | POA: Diagnosis not present

## 2021-05-23 DIAGNOSIS — I10 Essential (primary) hypertension: Secondary | ICD-10-CM | POA: Insufficient documentation

## 2021-05-23 DIAGNOSIS — R972 Elevated prostate specific antigen [PSA]: Secondary | ICD-10-CM | POA: Diagnosis not present

## 2021-05-23 DIAGNOSIS — Z7982 Long term (current) use of aspirin: Secondary | ICD-10-CM | POA: Diagnosis not present

## 2021-05-23 DIAGNOSIS — R599 Enlarged lymph nodes, unspecified: Secondary | ICD-10-CM

## 2021-05-23 DIAGNOSIS — K5900 Constipation, unspecified: Secondary | ICD-10-CM | POA: Insufficient documentation

## 2021-05-23 LAB — CMP (CANCER CENTER ONLY)
ALT: 30 U/L (ref 0–44)
AST: 17 U/L (ref 15–41)
Albumin: 3.9 g/dL (ref 3.5–5.0)
Alkaline Phosphatase: 68 U/L (ref 38–126)
Anion gap: 6 (ref 5–15)
BUN: 11 mg/dL (ref 8–23)
CO2: 29 mmol/L (ref 22–32)
Calcium: 9.6 mg/dL (ref 8.9–10.3)
Chloride: 105 mmol/L (ref 98–111)
Creatinine: 1.06 mg/dL (ref 0.61–1.24)
GFR, Estimated: >60 mL/min (ref >60)
Glucose, Bld: 127 mg/dL — ABNORMAL HIGH (ref 70–99)
Potassium: 3.7 mmol/L (ref 3.5–5.1)
Sodium: 140 mmol/L (ref 135–145)
Total Bilirubin: 0.8 mg/dL (ref 0.3–1.2)
Total Protein: 7 g/dL (ref 6.5–8.1)

## 2021-05-23 LAB — CBC WITH DIFFERENTIAL (CANCER CENTER ONLY)
Abs Immature Granulocytes: 0.02 10*3/uL (ref 0.00–0.07)
Basophils Absolute: 0 10*3/uL (ref 0.0–0.1)
Basophils Relative: 1 %
Eosinophils Absolute: 0.1 10*3/uL (ref 0.0–0.5)
Eosinophils Relative: 3 %
HCT: 40.4 % (ref 39.0–52.0)
Hemoglobin: 14.6 g/dL (ref 13.0–17.0)
Immature Granulocytes: 0 %
Lymphocytes Relative: 22 %
Lymphs Abs: 1.1 10*3/uL (ref 0.7–4.0)
MCH: 34.6 pg — ABNORMAL HIGH (ref 26.0–34.0)
MCHC: 36.1 g/dL — ABNORMAL HIGH (ref 30.0–36.0)
MCV: 95.7 fL (ref 80.0–100.0)
Monocytes Absolute: 0.7 10*3/uL (ref 0.1–1.0)
Monocytes Relative: 14 %
Neutro Abs: 2.9 10*3/uL (ref 1.7–7.7)
Neutrophils Relative %: 60 %
Platelet Count: 202 10*3/uL (ref 150–400)
RBC: 4.22 MIL/uL (ref 4.22–5.81)
RDW: 11.8 % (ref 11.5–15.5)
WBC Count: 4.9 10*3/uL (ref 4.0–10.5)
nRBC: 0 % (ref 0.0–0.2)

## 2021-05-23 MED ORDER — ONDANSETRON HCL 8 MG PO TABS: 8.0000 mg | ORAL_TABLET | Freq: Two times a day (BID) | ORAL | 1 refills | Status: DC | PRN

## 2021-05-23 MED ORDER — PROCHLORPERAZINE MALEATE 10 MG PO TABS: 10.0000 mg | ORAL_TABLET | Freq: Four times a day (QID) | ORAL | 1 refills | Status: DC | PRN

## 2021-05-23 MED ORDER — PANTOPRAZOLE SODIUM 40 MG PO TBEC: 40.0000 mg | DELAYED_RELEASE_TABLET | Freq: Two times a day (BID) | ORAL | 1 refills | Status: DC

## 2021-05-23 NOTE — Telephone Encounter (Signed)
PCCM:  I called and spoke with the patient regarding referral for adenopathy and his diagnosis of esophageal cancer.  Patient has received general anesthesia in the past.  He is not on any anticoagulants.  He is agreeable to proceed with bronchoscopy and biopsy.  He is also agreeable to meet me on day of procedure for full consultation.  Orders have been placed for EBUS bronchoscopy this Thursday, 05/26/2021.  Garner Nash, DO [facility] 05/23/2021 12:07 PM

## 2021-05-23 NOTE — Progress Notes (Signed)
[facility]   Telephone:([phone]) [phone] Fax:(336) [phone]   Clinic Follow up Note   Patient Care Team: [name] as PCP - General (Physician Assistant)  Date of Service:  05/23/2021  CHIEF COMPLAINT: f/u of esophageal cancer  SUMMARY OF ONCOLOGIC HISTORY: Oncology History  Malignant neoplasm of overlapping sites of esophagus ([place])  04/18/2021 Imaging   ULTRASOUND OF HEAD/NECK SOFT TISSUES  IMPRESSION: No suspicious lymphadenopathy   04/19/2021 Procedure   EGD  Impression: - Esophageal polyp(s) were found. Biopsied. - Partially obstructing, likely malignant esophageal tumor was found in the distal esophagus. - Small hiatal hernia. - A single gastric polyp. Biopsied. - Normal duodenal bulb, first portion of the duodenum and second portion of the duodenum.   04/19/2021 Pathology Results   FINAL MICROSCOPIC DIAGNOSIS: Stomach, Biopsy - FUNDIC GLAND POLYP. Negative for dysplasia. NO Helicobacter pylori organisms seen on H and E stain. Esophagus- Distal, Biopsy - INVASIVE MODERATELY DIFFERENTIATED ADENOCARCINOMA OF THE ESOPHAGUS.  - HER2 Study by Immunostain: Negative (score 1+) Esophagus- Mid, Biopsy - INVASIVE MODERATELY DIFFERENTIATED ADENOCARCINOMA OF THE ESOPHAGUS. - HER2 Study by Immunostain: Negative (score 1+)   05/05/2021 Imaging   CT CAP  IMPRESSION: 1. The patient's known esophageal mass is not well visualized. There is mild irregular wall thickening of the distal esophagus. Correlate with previous clinical evaluation. 2. Indeterminate mildly enlarged high right paratracheal node near the thoracic inlet. No other mediastinal adenopathy. 3. No evidence of metastatic disease in the abdomen or pelvis. 4. Stable small pulmonary nodules bilaterally consistent with benign findings. 5. Cholelithiasis, small renal cysts and Aortic Atherosclerosis (ICD10-I70.0).   05/09/2021 Initial Diagnosis   Malignant neoplasm of overlapping sites of esophagus  ([place])    05/11/2021 Procedure   Upper EUS  Impression: - Mucosal nodule found in the esophagus. - Partially obstructing, malignant esophageal tumor was found at the gastroesophageal junction. - At least 3 discrete peritumoral lymph nodes - A mass was found in the gastroesophageal junction. A tissue diagnosis was obtained prior to this exam. This is of adenocarcinoma. This was staged T3 N2 Mx by endosonographic criteria. - Unable to traverse GE junction with either EGD scope or radial EUS scope.   05/13/2021 Cancer Staging   Staging form: Esophagus - Adenocarcinoma, AJCC 8th Edition - Clinical stage from 05/13/2021: Stage IVA (cT3, cN2, cM0) - Signed by [name], MD on 05/22/2021    05/16/2021 PET scan   IMPRESSION: 1. Distal esophageal mass measuring about 6.2 cm in length, maximum SUV 13.1. 2. Mildly hypermetabolic and mildly enlarged upper right paratracheal lymph node, 1.3 cm in short axis with maximum SUV 3.6, concerning for malignant involvement. 3. Hypodense 1.1 cm right thyroid nodule has a maximum SUV substantially above that of the background thyroid. A significant minority of such nodules can harbor thyroid cancer. Recommend thyroid US and biopsy (ref: J Am Coll Radiol. 2015 Feb;12(2): 143-50). 4. 3 by 4 mm right lower lobe pulmonary nodule is similar to 05/05/2021, not hypermetabolic but below sensitive PET-CT size thresholds. Surveillance recommended. 5. Other imaging findings of potential clinical significance: Chronic left maxillary sinusitis. Aortic Atherosclerosis (ICD10-I70.0). Cholelithiasis. Prostatomegaly.   05/24/2021 -  Chemotherapy    Patient is on Treatment Plan: ESOPHAGUS CARBOPLATIN/PACLITAXEL WEEKLY X 6 WEEKS WITH XRT            CURRENT THERAPY:  PENDING Concurrent chemoRT with TC, to begin 05/24/21  INTERVAL HISTORY:  Nathaniel Jennings is here for a follow up of esophageal cancer. He was last seen  by me in consultation on 05/09/21. He presents to the  clinic accompanied by his wife. He reports feeling well overall.   All other systems were reviewed with the patient and are negative.  MEDICAL HISTORY:  Past Medical History:  Diagnosis Date   BPH (benign prostatic hyperplasia)    Dyslipidemia    Elevated PSA    Esophageal cancer ([place])    Full dentures    History of melanoma excision    07/ 2011  s/p  wide excision left back--- per pathology-- negative maligant --  mild melanocytic atypia   History of palpitations    cardiologist --  dr Shelva Majestic (last note in epic 2014   Hypertension    Sleep apnea     SURGICAL HISTORY: Past Surgical History:  Procedure Laterality Date   COLONOSCOPY  2009   ESOPHAGOGASTRODUODENOSCOPY (EGD) WITH PROPOFOL N/A 05/11/2021   Procedure: ESOPHAGOGASTRODUODENOSCOPY (EGD) WITH PROPOFOL;  Surgeon: Arta Silence, MD;  Location: [facility];  Service: Endoscopy;  Laterality: N/A;   EUS N/A 05/11/2021   Procedure: ESOPHAGEAL ENDOSCOPIC ULTRASOUND (EUS) RADIAL;  Surgeon: Arta Silence, MD;  Location: [facility];  Service: Endoscopy;  Laterality: N/A;   PROSTATE BIOPSY N/A 07/02/2015   Procedure: BIOPSY TRANSRECTAL ULTRASONIC PROSTATE (TUBP);  Surgeon: Lowella Bandy, MD;  Location[facility];  Service: Urology;  Laterality: N/A;   PROSTATE BIOPSY N/A 11/22/2015   Procedure: BIOPSY TRANSRECTAL ULTRASONIC PROSTATE (TUBP);  Surgeon: Cleon Gustin, MD;  Location[facility];  Service: Urology;  Laterality: N/A;   TRANSTHORACIC ECHOCARDIOGRAM  11-19-2012   normal echo/  trivial TR   WIDE EXCISION MELANOMA LEFT BACK  05-02-2010    I have reviewed the social history and family history with the patient and they are unchanged from previous note.  ALLERGIES:  is allergic to bee venom.  MEDICATIONS:  Current Outpatient Medications  Medication Sig Dispense Refill   aspirin EC 81 MG tablet Take 81 mg by mouth in the morning.     Krill Oil 500 MG CAPS Take 500 mg by mouth in  the morning.     metoprolol succinate (TOPROL-XL) 100 MG 24 hr tablet Take 100 mg by mouth in the morning.     ondansetron (ZOFRAN) 8 MG tablet Take 1 tablet (8 mg total) by mouth 2 (two) times daily as needed for refractory nausea / vomiting. Start on day 3 after chemo. 30 tablet 1   pantoprazole (PROTONIX) 40 MG tablet Take 1 tablet (40 mg total) by mouth 2 (two) times daily. 30 tablet 1   prochlorperazine (COMPAZINE) 10 MG tablet Take 1 tablet (10 mg total) by mouth every 6 (six) hours as needed (Nausea or vomiting). 30 tablet 1   No current facility-administered medications for this visit.    PHYSICAL EXAMINATION: ECOG PERFORMANCE STATUS: 1 - Symptomatic but completely ambulatory  Vitals:   05/23/21 0936  BP: (!) 146/85  Pulse: 64  Resp: 19  Temp: 98.4 F (36.9 C)  SpO2: 100%   Wt Readings from Last 3 Encounters:  05/23/21 168 lb 14.4 oz (76.6 kg)  05/13/21 167 lb (75.8 kg)  05/11/21 165 lb 12.6 oz (75.2 kg)     GENERAL:alert, no distress and comfortable SKIN: skin color normal, no rashes or significant lesions EYES: normal, Conjunctiva are pink and non-injected, sclera clear  NEURO: alert & oriented x 3 with fluent speech  LABORATORY DATA:  I have reviewed the data as listed CBC Latest Ref Rng & Units 05/23/2021 05/13/2021  11/22/2015  WBC 4.0 - 10.5 K/uL 4.9 3.9(L) -  Hemoglobin 13.0 - 17.0 g/dL 14.6 15.0 16.0  Hematocrit 39.0 - 52.0 % 40.4 40.3 47.0  Platelets 150 - 400 K/uL 202 207 -     CMP Latest Ref Rng & Units 05/23/2021 05/13/2021 11/22/2015  Glucose 70 - 99 mg/dL 127(H) 97 98  BUN 8 - 23 mg/dL _0 Creatinine 0.61 - 1.24 mg/dL 1.06 0.86 1.00  Sodium 135 - 145 mmol/L 140 142 142  Potassium 3.5 - 5.1 mmol/L 3.7 4.0 4.2  Chloride 98 - 111 mmol/L 105 104 103  CO2 22 - 32 mmol/L 29 28 -  Calcium 8.9 - 10.3 mg/dL 9.6 10.2 -  Total Protein 6.5 - 8.1 g/dL 7.0 7.5 -  Total Bilirubin 0.3 - 1.2 mg/dL 0.8 0.7 -  Alkaline Phos 38 - 126 U/L 68 59 -  AST 15 - 41 U/L  17 26 -  ALT 0 - 44 U/L 30 37 -    RADIOGRAPHIC STUDIES: I have personally reviewed the radiological images as listed and agreed with the findings in the report. No results found.   ASSESSMENT & PLAN:  Nathaniel Jennings is a 64 y.o. 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 -EGD performed by Dr. Alessandra Bevels on 04/19/21 showed a partially obstructing, likely malignant, esophageal tumor in the distal esophagus and a polyp in the mid esophagus. Biopsy of both confirmed invasive moderately differentiated adenocarcinoma of the esophagus, Her2 negative. -CT CAP on 05/05/21 showing: esophageal mass not well visualized; indeterminate mildly-enlarged high right paratracheal node near thoracic inlet; no other mediastinal adenopathy or evidence of metastatic disease in abdomen or pelvis; stable small pulmonary nodules bilaterally. -EUS 05/11/21 staged this as T3 N2 Mx. -PET scan 05/16/21 showed: 6.2 cm distal esophageal mass; hypermetabolic upper right paratracheal lymph node.  No other distant metastasis.  I reviewed the images in person with them today. -We discussed that the upper mediastinal lymph node is out of the surgical resection range if it's a metastatic node.  This has been discussed in [place] tumor board last week, and then we recommend EBUS biopsy.  Patient is agreeable, referral was made to pulmonologist Dr. Valeta Harms -we recommend him to start neoadjuvant chemoradiation, followed by surgical resection if he has excellent response to chemRT  -He is scheduled to begin concurrent chemoRT with weekly TC tomorrow, 05/24/21. Labs reviewed, adequate to proceed with treatment tomorrow. -He is also scheduled to meet Dr. Kipp Brood on 05/27/21.   2.  Dysphagia with mild weight loss -He lost about 15 lbs from April to July 2022. -We previously discussed  nutrition supplement, especially with liquid nutrition, such as Ensure, boost, Carnation breakfast. I also recommended Glucerna for watching his sugar. -He met with our nutritionist on 05/17/21 -His weight has been stable to slightly improved since the recent drop.  3. Hypermetabolic thyroid nodule -seen on PET 05/16/21 (and not on head/neck US on 04/18/21) -will order repeat head/neck US.   4. HTN and BPH -continue medication, will monitor his blood pressure closely during treatment.    PLAN:  -proceed with C1 chemoRT tomorrow -I ordered head/neck US to evaluate thyroid nodule to see if he needs biopsy  -He was referred to pulmonologist Dr. Valeta Harms for EBUS biopsy of the upper mediastinal node -labs and f/u in one week.    No  problem-specific Assessment & Plan notes found for this encounter.   Orders Placed This Encounter  Procedures   US Soft Tissue Head/Neck    Standing Status:   Future    Standing Expiration Date:   05/23/2022    Order Specific Question:   Reason for Exam (SYMPTOM  OR DIAGNOSIS REQUIRED)    Answer:   hypermetabolic thyroid nodule on PET scan    Order Specific Question:   Preferred imaging location?    Answer:   [facility]    Order Specific Question:   Release to patient    Answer:   Immediate   All questions were answered. The patient knows to call the clinic with any problems, questions or concerns. No barriers to learning was detected. The total time spent in the appointment was 30 minutes.     [name], MD 05/23/2021   I, Wilburn Mylar, am acting as scribe for [name], MD.   I have reviewed the above documentation for accuracy and completeness, and I agree with the above.

## 2021-05-23 NOTE — Telephone Encounter (Signed)
The following has been scheduled & pt has been made aware: COVID Test - 8/1 before 3 PM at [facility].  EBUS - 8/4 @ 8:30, checking in by 6 AM

## 2021-05-23 NOTE — Telephone Encounter (Signed)
This nurse left a message for patient related to radiation treatment time being changed for 05/24/21.  Per Dr. Burr Medico this nurse contacted Radiation and request treatment time be moved closer to infusion time.  They were able to move time from 230 pm to 1245 pm.

## 2021-05-24 ENCOUNTER — Ambulatory Visit: Payer: BC Managed Care – PPO

## 2021-05-24 ENCOUNTER — Inpatient Hospital Stay: Payer: BC Managed Care – PPO | Admitting: Nutrition

## 2021-05-24 ENCOUNTER — Ambulatory Visit
Admission: RE | Admit: 2021-05-24 | Discharge: 2021-05-24 | Disposition: A | Discharge status: Home / Self Care | Payer: BC Managed Care – PPO | Source: Ambulatory Visit | Attending: Radiation Oncology | Admitting: Radiation Oncology

## 2021-05-24 ENCOUNTER — Inpatient Hospital Stay: Payer: BC Managed Care – PPO

## 2021-05-24 VITALS — BP 100/59 | HR 55 | Temp 98.5°F | Resp 16 | Wt 168.9 lb

## 2021-05-24 DIAGNOSIS — Z7982 Long term (current) use of aspirin: Secondary | ICD-10-CM | POA: Diagnosis not present

## 2021-05-24 DIAGNOSIS — Z510 Encounter for antineoplastic radiation therapy: Secondary | ICD-10-CM | POA: Diagnosis not present

## 2021-05-24 DIAGNOSIS — C158 Malignant neoplasm of overlapping sites of esophagus: Secondary | ICD-10-CM | POA: Insufficient documentation

## 2021-05-24 DIAGNOSIS — I10 Essential (primary) hypertension: Secondary | ICD-10-CM | POA: Diagnosis not present

## 2021-05-24 DIAGNOSIS — Z5111 Encounter for antineoplastic chemotherapy: Secondary | ICD-10-CM | POA: Diagnosis not present

## 2021-05-24 DIAGNOSIS — R972 Elevated prostate specific antigen [PSA]: Secondary | ICD-10-CM | POA: Diagnosis not present

## 2021-05-24 DIAGNOSIS — R1310 Dysphagia, unspecified: Secondary | ICD-10-CM | POA: Diagnosis not present

## 2021-05-24 DIAGNOSIS — Z79899 Other long term (current) drug therapy: Secondary | ICD-10-CM | POA: Diagnosis not present

## 2021-05-24 DIAGNOSIS — E041 Nontoxic single thyroid nodule: Secondary | ICD-10-CM | POA: Diagnosis not present

## 2021-05-24 DIAGNOSIS — Z85820 Personal history of malignant melanoma of skin: Secondary | ICD-10-CM | POA: Diagnosis not present

## 2021-05-24 DIAGNOSIS — K5900 Constipation, unspecified: Secondary | ICD-10-CM | POA: Diagnosis not present

## 2021-05-24 LAB — SARS CORONAVIRUS 2 (TAT 6-24 HRS): SARS Coronavirus 2: NEGATIVE

## 2021-05-24 MED ORDER — PALONOSETRON HCL INJECTION 0.25 MG/5ML
INTRAVENOUS | Status: AC
  Filled 2021-05-24: qty 5

## 2021-05-24 MED ORDER — FAMOTIDINE 20 MG IN NS 100 ML IVPB
20.0000 mg | Freq: Once | INTRAVENOUS | Status: AC
Start: 2021-05-24 — End: 2021-05-24
  Administered 2021-05-24: 20 mg via INTRAVENOUS

## 2021-05-24 MED ORDER — DIPHENHYDRAMINE HCL 50 MG/ML IJ SOLN
50.0000 mg | Freq: Once | INTRAMUSCULAR | Status: AC
  Administered 2021-05-24: 50 mg via INTRAVENOUS

## 2021-05-24 MED ORDER — SODIUM CHLORIDE 0.9 % IV SOLN
50.0000 mg/m2 | Freq: Once | INTRAVENOUS | Status: AC
  Administered 2021-05-24: 96 mg via INTRAVENOUS
  Filled 2021-05-24: qty 16

## 2021-05-24 MED ORDER — FAMOTIDINE 20 MG IN NS 100 ML IVPB
INTRAVENOUS | Status: AC
  Filled 2021-05-24: qty 100

## 2021-05-24 MED ORDER — SONAFINE EX EMUL
1.0000 "application " | Freq: Two times a day (BID) | CUTANEOUS | Status: DC
  Administered 2021-05-24: 1 via TOPICAL

## 2021-05-24 MED ORDER — SODIUM CHLORIDE 0.9 % IV SOLN
199.8000 mg | Freq: Once | INTRAVENOUS | Status: AC
  Administered 2021-05-24: 200 mg via INTRAVENOUS
  Filled 2021-05-24: qty 20

## 2021-05-24 MED ORDER — PALONOSETRON HCL INJECTION 0.25 MG/5ML
0.2500 mg | Freq: Once | INTRAVENOUS | Status: AC
  Administered 2021-05-24: 0.25 mg via INTRAVENOUS

## 2021-05-24 MED ORDER — SODIUM CHLORIDE 0.9 % IV SOLN
10.0000 mg | Freq: Once | INTRAVENOUS | Status: AC
  Administered 2021-05-24: 10 mg via INTRAVENOUS
  Filled 2021-05-24: qty 10

## 2021-05-24 MED ORDER — DIPHENHYDRAMINE HCL 50 MG/ML IJ SOLN
INTRAMUSCULAR | Status: AC
  Filled 2021-05-24: qty 1

## 2021-05-24 MED ORDER — SODIUM CHLORIDE 0.9 % IV SOLN
Freq: Once | INTRAVENOUS | Status: AC
  Filled 2021-05-24: qty 250

## 2021-05-24 NOTE — Progress Notes (Signed)
Nutrition follow-up completed with patient during infusion for esophageal cancer. Patient is receiving his first concurrent chemoradiation therapy with TC. Weight improved and documented as 168.8 pounds August 2.  This is increased from 167 pounds July 26. Noted labs: Glucose 127. Patient is drinking between 2 and 3 cartons of Ensure a day. Denies any new nutrition impact symptoms this week.  Nutrition diagnosis: Unintended weight loss improved.  Intervention: Educated to continue liquids as tolerated. Consume Ensure Plus or equivalent 3 times daily between meals. Try to incorporate smaller amounts of food more often.  Monitoring, evaluation, goals: Patient will tolerate increased calories and protein to minimize weight loss during treatment.  Next visit: Tuesday, August 9 during infusion.  **Disclaimer: This note was dictated with voice recognition software. Similar sounding words can inadvertently be transcribed and this note may contain transcription errors which may not have been corrected upon publication of note.**

## 2021-05-24 NOTE — Progress Notes (Signed)
Pt here for patient teaching.    Pt given Radiation and You booklet, skin care instructions, and Sonafine.    Reviewed areas of pertinence such as fatigue, hair loss, skin changes, and throat changes .   Pt able to give teach back of to pat skin, use unscented/gentle soap, and drink plenty of water,apply Sonafine bid and avoid applying anything to skin within 4 hours of treatment.   Pt demonstrated understanding and verbalizes understanding of information given and will contact nursing with any questions or concerns.    [email or web address]

## 2021-05-24 NOTE — Patient Instructions (Signed)
[name]  Discharge Instructions: Thank you for choosing [facility] to provide your oncology and hematology care.   If you have a lab appointment with the [facility], please go directly to the [facility] and check in at the registration area.   Wear comfortable clothing and clothing appropriate for easy access to any Portacath or PICC line.   We strive to give you quality time with your provider. You may need to reschedule your appointment if you arrive late (15 or more minutes).  Arriving late affects you and other patients whose appointments are after yours.  Also, if you miss three or more appointments without notifying the office, you may be dismissed from the clinic at the provider's discretion.      For prescription refill requests, have your pharmacy contact our office and allow 72 hours for refills to be completed.    Today you received the following chemotherapy and/or immunotherapy agents taxol/carboplatin      To help prevent nausea and vomiting after your treatment, we encourage you to take your nausea medication as directed.  BELOW ARE SYMPTOMS THAT SHOULD BE REPORTED IMMEDIATELY: *FEVER GREATER THAN 100.4 F (38 C) OR HIGHER *CHILLS OR SWEATING *NAUSEA AND VOMITING THAT IS NOT CONTROLLED WITH YOUR NAUSEA MEDICATION *UNUSUAL SHORTNESS OF BREATH *UNUSUAL BRUISING OR BLEEDING *URINARY PROBLEMS (pain or burning when urinating, or frequent urination) *BOWEL PROBLEMS (unusual diarrhea, constipation, pain near the anus) TENDERNESS IN MOUTH AND THROAT WITH OR WITHOUT PRESENCE OF ULCERS (sore throat, sores in mouth, or a toothache) UNUSUAL RASH, SWELLING OR PAIN  UNUSUAL VAGINAL DISCHARGE OR ITCHING   Items with * indicate a potential emergency and should be followed up as soon as possible or go to the Emergency Department if any problems should occur.  Please show the CHEMOTHERAPY ALERT CARD or IMMUNOTHERAPY ALERT CARD at  check-in to the Emergency Department and triage nurse.  Should you have questions after your visit or need to cancel or reschedule your appointment, please contact [facility]  Dept: [phone]  and follow the prompts.  Office hours are 8:00 a.m. to 4:30 p.m. Monday - Friday. Please note that voicemails left after 4:00 p.m. may not be returned until the following business day.  We are closed weekends and major holidays. You have access to a nurse at all times for urgent questions. Please call the main number to the clinic Dept: [phone] and follow the prompts.   For any non-urgent questions, you may also contact your provider using MyChart. We now offer e-Visits for anyone 18 and older to request care online for non-urgent symptoms. For details visit [email or web address].   Also download the MyChart app! Go to the app store, search "MyChart", open the app, select , and log in with your MyChart username and password.  Due to Covid, a mask is required upon entering the hospital/clinic. If you do not have a mask, one will be given to you upon arrival. For doctor visits, patients may have 1 support person aged 18 or older with them. For treatment visits, patients cannot have anyone with them due to current Covid guidelines and our immunocompromised population.   Paclitaxel injection What is this medication? PACLITAXEL (PAK li TAX el) is a chemotherapy drug. It targets fast dividing cells, like cancer cells, and causes these cells to die. This medicine is used to treat ovarian cancer, breast cancer, lung cancer, Kaposi's sarcoma, andother cancers. This medicine may be   used for other purposes; ask your health care provider orpharmacist if you have questions. COMMON BRAND NAME(S): Onxol, Taxol What should I tell my care team before I take this medication? They need to know if you have any of these conditions: history of irregular heartbeat liver  disease low blood counts, like low white cell, platelet, or red cell counts lung or breathing disease, like asthma tingling of the fingers or toes, or other nerve disorder an unusual or allergic reaction to paclitaxel, alcohol, polyoxyethylated castor oil, other chemotherapy, other medicines, foods, dyes, or preservatives pregnant or trying to get pregnant breast-feeding How should I use this medication? This drug is given as an infusion into a vein. It is administered in a hospitalor clinic by a specially trained health care professional. Talk to your pediatrician regarding the use of this medicine in children.Special care may be needed. Overdosage: If you think you have taken too much of this medicine contact apo[facility] control [facility] or emergency room at once. NOTE: This medicine is only for you. Do not share this medicine with others. What if I miss a dose? It is important not to miss your dose. Call your doctor or health careprofessional if you are unable to keep an appointment. What may interact with this medication? Do not take this medicine with any of the following medications: live virus vaccines This medicine may also interact with the following medications: antiviral medicines for hepatitis, HIV or AIDS certain antibiotics like erythromycin and clarithromycin certain medicines for fungal infections like ketoconazole and itraconazole certain medicines for seizures like carbamazepine, phenobarbital, phenytoin gemfibrozil nefazodone rifampin St. John's wort This list may not describe all possible interactions. Give your health care provider a list of all the medicines, herbs, non-prescription drugs, or dietary supplements you use. Also tell them if you smoke, drink alcohol, or use illegaldrugs. Some items may interact with your medicine. What should I watch for while using this medication? Your condition will be monitored carefully while you are receiving this medicine. You will  need important blood work done while you are taking thismedicine. This medicine can cause serious allergic reactions. To reduce your risk you will need to take other medicine(s) before treatment with this medicine. If you experience allergic reactions like skin rash, itching or hives, swelling of theface, lips, or tongue, tell your doctor or health care professional right away. In some cases, you may be given additional medicines to help with side effects.Follow all directions for their use. This drug may make you feel generally unwell. This is not uncommon, as chemotherapy can affect healthy cells as well as cancer cells. Report any side effects. Continue your course of treatment even though you feel ill unless yourdoctor tells you to stop. Call your doctor or health care professional for advice if you get a fever, chills or sore throat, or other symptoms of a cold or flu. Do not treat yourself. This drug decreases your body's ability to fight infections. Try toavoid being around people who are sick. This medicine may increase your risk to bruise or bleed. Call your doctor orhealth care professional if you notice any unusual bleeding. Be careful brushing and flossing your teeth or using a toothpick because you may get an infection or bleed more easily. If you have any dental work done,tell your dentist you are receiving this medicine. Avoid taking products that contain aspirin, acetaminophen, ibuprofen, naproxen, or ketoprofen unless instructed by your doctor. These medicines may hide afever. Do not become pregnant while taking this medicine.   Women should inform their doctor if they wish to become pregnant or think they might be pregnant. There is a potential for serious side effects to an unborn child. Talk to your health care professional or pharmacist for more information. Do not breast-feed aninfant while taking this medicine. Men are advised not to father a child while receiving this medicine. This  product may contain alcohol. Ask your pharmacist or healthcare provider if this medicine contains alcohol. Be sure to tell all healthcare providers you are taking this medicine. Certain medicines, like metronidazole and disulfiram, can cause an unpleasant reaction when taken with alcohol. The reaction includes flushing, headache, nausea, vomiting, sweating, and increased thirst. Thereaction can last from 30 minutes to several hours. What side effects may I notice from receiving this medication? Side effects that you should report to your doctor or health care professionalas soon as possible: allergic reactions like skin rash, itching or hives, swelling of the face, lips, or tongue breathing problems changes in vision fast, irregular heartbeat high or low blood pressure mouth sores pain, tingling, numbness in the hands or feet signs of decreased platelets or bleeding - bruising, pinpoint red spots on the skin, black, tarry stools, blood in the urine signs of decreased red blood cells - unusually weak or tired, feeling faint or lightheaded, falls signs of infection - fever or chills, cough, sore throat, pain or difficulty passing urine signs and symptoms of liver injury like dark yellow or brown urine; general ill feeling or flu-like symptoms; light-colored stools; loss of appetite; nausea; right upper belly pain; unusually weak or tired; yellowing of the eyes or skin swelling of the ankles, feet, hands unusually slow heartbeat Side effects that usually do not require medical attention (report to yourdoctor or health care professional if they continue or are bothersome): diarrhea hair loss loss of appetite muscle or joint pain nausea, vomiting pain, redness, or irritation at site where injected tiredness This list may not describe all possible side effects. Call your doctor for medical advice about side effects. You may report side effects to FDA at1-800-FDA-1088. Where should I keep my  medication? This drug is given in a hospital or clinic and will not be stored at home. NOTE: This sheet is a summary. It may not cover all possible information. If you have questions about this medicine, talk to your doctor, pharmacist, orhealth care provider.  2022 Elsevier/Gold Standard (2019-09-10 13:37:23)  Carboplatin injection What is this medication? CARBOPLATIN (KAR boe pla tin) is a chemotherapy drug. It targets fast dividing cells, like cancer cells, and causes these cells to die. This medicine is usedto treat ovarian cancer and many other cancers. This medicine may be used for other purposes; ask your health care provider orpharmacist if you have questions. COMMON BRAND NAME(S): Paraplatin What should I tell my care team before I take this medication? They need to know if you have any of these conditions: blood disorders hearing problems kidney disease recent or ongoing radiation therapy an unusual or allergic reaction to carboplatin, cisplatin, other chemotherapy, other medicines, foods, dyes, or preservatives pregnant or trying to get pregnant breast-feeding How should I use this medication? This drug is usually given as an infusion into a vein. It is administered in ahospital or clinic by a specially trained health care professional. Talk to your pediatrician regarding the use of this medicine in children.Special care may be needed. Overdosage: If you think you have taken too much of this medicine contact apoison control [facility] or emergency room at   once. NOTE: This medicine is only for you. Do not share this medicine with others. What if I miss a dose? It is important not to miss a dose. Call your doctor or health careprofessional if you are unable to keep an appointment. What may interact with this medication? medicines for seizures medicines to increase blood counts like filgrastim, pegfilgrastim, sargramostim some antibiotics like amikacin, gentamicin, neomycin,  streptomycin, tobramycin vaccines Talk to your doctor or health care professional before taking any of thesemedicines: acetaminophen aspirin ibuprofen ketoprofen naproxen This list may not describe all possible interactions. Give your health care provider a list of all the medicines, herbs, non-prescription drugs, or dietary supplements you use. Also tell them if you smoke, drink alcohol, or use illegaldrugs. Some items may interact with your medicine. What should I watch for while using this medication? Your condition will be monitored carefully while you are receiving this medicine. You will need important blood work done while you are taking thismedicine. This drug may make you feel generally unwell. This is not uncommon, as chemotherapy can affect healthy cells as well as cancer cells. Report any side effects. Continue your course of treatment even though you feel ill unless yourdoctor tells you to stop. In some cases, you may be given additional medicines to help with side effects.Follow all directions for their use. Call your doctor or health care professional for advice if you get a fever, chills or sore throat, or other symptoms of a cold or flu. Do not treat yourself. This drug decreases your body's ability to fight infections. Try toavoid being around people who are sick. This medicine may increase your risk to bruise or bleed. Call your doctor orhealth care professional if you notice any unusual bleeding. Be careful brushing and flossing your teeth or using a toothpick because you may get an infection or bleed more easily. If you have any dental work done,tell your dentist you are receiving this medicine. Avoid taking products that contain aspirin, acetaminophen, ibuprofen, naproxen, or ketoprofen unless instructed by your doctor. These medicines may hide afever. Do not become pregnant while taking this medicine. Women should inform their doctor if they wish to become pregnant or think  they might be pregnant. There is a potential for serious side effects to an unborn child. Talk to your health care professional or pharmacist for more information. Do not breast-feed aninfant while taking this medicine. What side effects may I notice from receiving this medication? Side effects that you should report to your doctor or health care professionalas soon as possible: allergic reactions like skin rash, itching or hives, swelling of the face, lips, or tongue signs of infection - fever or chills, cough, sore throat, pain or difficulty passing urine signs of decreased platelets or bleeding - bruising, pinpoint red spots on the skin, black, tarry stools, nosebleeds signs of decreased red blood cells - unusually weak or tired, fainting spells, lightheadedness breathing problems changes in hearing changes in vision chest pain high blood pressure low blood counts - This drug may decrease the number of white blood cells, red blood cells and platelets. You may be at increased risk for infections and bleeding. nausea and vomiting pain, swelling, redness or irritation at the injection site pain, tingling, numbness in the hands or feet problems with balance, talking, walking trouble passing urine or change in the amount of urine Side effects that usually do not require medical attention (report to yourdoctor or health care professional if they continue or are bothersome): hair loss   loss of appetite metallic taste in the mouth or changes in taste This list may not describe all possible side effects. Call your doctor for medical advice about side effects. You may report side effects to FDA at1-800-FDA-1088. Where should I keep my medication? This drug is given in a hospital or clinic and will not be stored at home. NOTE: This sheet is a summary. It may not cover all possible information. If you have questions about this medicine, talk to your doctor, pharmacist, orhealth care provider.  2022  Elsevier/Gold Standard (2008-01-14 14:38:05)   

## 2021-05-25 ENCOUNTER — Telehealth: Payer: Self-pay | Admitting: *Deleted

## 2021-05-25 ENCOUNTER — Encounter (HOSPITAL_COMMUNITY): Payer: Self-pay | Admitting: Pulmonary Disease

## 2021-05-25 ENCOUNTER — Other Ambulatory Visit: Payer: Self-pay

## 2021-05-25 ENCOUNTER — Ambulatory Visit
Admission: RE | Admit: 2021-05-25 | Discharge: 2021-05-25 | Disposition: A | Discharge status: Home / Self Care | Payer: BC Managed Care – PPO | Source: Ambulatory Visit | Attending: Radiation Oncology | Admitting: Radiation Oncology

## 2021-05-25 ENCOUNTER — Telehealth: Payer: Self-pay | Admitting: Pulmonary Disease

## 2021-05-25 DIAGNOSIS — C158 Malignant neoplasm of overlapping sites of esophagus: Secondary | ICD-10-CM | POA: Diagnosis not present

## 2021-05-25 DIAGNOSIS — Z510 Encounter for antineoplastic radiation therapy: Secondary | ICD-10-CM | POA: Diagnosis not present

## 2021-05-25 NOTE — Telephone Encounter (Signed)
Records faxed [facility]

## 2021-05-25 NOTE — Progress Notes (Signed)
PCP - Dr. Tobie Lords  Cardiologist - Denies  EP-Denies  Endocrine-Denies  Pulm-Denies  Onco- Dr. Burr Medico  Chest x-ray - Denies  EKG - 05/26/21-DOS  Stress Test - 11/14/17 (E)  ECHO - 11/19/12 (E)  Cardiac Cath - Denies  AICD-na PM-na LOOP-na  Dialysis- Denies  Sleep Study - Yes- Positive CPAP - Yes  LABS- 05/23/21: CBC w/D, CMP, COVID (Neg)  ASA- LD- 05/25/21  ERAS- No  HA1C- Denies  Anesthesia- No  Pt denies having chest pain, sob, or fever during the pre-op phone call. All instructions explained to the pt, with a verbal understanding of the material including as of today, stop taking all Aspirin (unless instructed by your doctor) and Other Aspirin containing products, Vitamins, Fish oils, and Herbal medications. Also stop all NSAIDS i.e. Advil, Ibuprofen, Motrin, Aleve, Anaprox, Naproxen, BC, Goody Powders, and all Supplements. Pt also instructed to wear a mask and social distance after being tested for COVID-19. The opportunity to ask questions was provided.    Coronavirus Screening  Have you experienced the following symptoms:  Cough yes/no: No Fever (>100.61F)  yes/no: No Runny nose yes/no: No Sore throat yes/no: No Difficulty breathing/shortness of breath  yes/no: No  Have you or a family member traveled in the last 14 days and where? yes/no: No   If the patient indicates "YES" to the above questions, their PAT will be rescheduled to limit the exposure to others and, the surgeon will be notified. THE PATIENT WILL NEED TO BE ASYMPTOMATIC FOR 14 DAYS.   If the patient is not experiencing any of these symptoms, the PAT nurse will instruct them to NOT bring anyone with them to their appointment since they may have these symptoms or traveled as well.   Please remind your patients and families that hospital visitation restrictions are in effect and the importance of the restrictions.

## 2021-05-25 NOTE — Telephone Encounter (Signed)
PCCs, please advise on notes for authorization. Patient is having an EBUS tomm with Dr. Valeta Harms. Thanks!

## 2021-05-25 NOTE — Progress Notes (Signed)
Lauren from the [facility] on-call service made aware that the pt will not be able to make is radiation appointment at 1030 tomorrow, as he will be at [facility] in a surgical procedure with Dr. Valeta Harms.

## 2021-05-26 ENCOUNTER — Encounter (HOSPITAL_COMMUNITY)
Admission: RE | Disposition: A | Discharge status: Home / Self Care | Payer: Self-pay | Source: Home / Self Care | Attending: Pulmonary Disease

## 2021-05-26 ENCOUNTER — Other Ambulatory Visit: Payer: Self-pay

## 2021-05-26 ENCOUNTER — Ambulatory Visit (HOSPITAL_COMMUNITY): Payer: BC Managed Care – PPO | Admitting: Certified Registered Nurse Anesthetist

## 2021-05-26 ENCOUNTER — Ambulatory Visit (HOSPITAL_COMMUNITY)
Admission: RE | Admit: 2021-05-26 | Discharge: 2021-05-26 | Disposition: A | Discharge status: Home / Self Care | Payer: BC Managed Care – PPO | Attending: Pulmonary Disease | Admitting: Pulmonary Disease

## 2021-05-26 ENCOUNTER — Encounter (HOSPITAL_COMMUNITY): Payer: Self-pay | Admitting: Pulmonary Disease

## 2021-05-26 ENCOUNTER — Ambulatory Visit (HOSPITAL_COMMUNITY): Payer: BC Managed Care – PPO

## 2021-05-26 ENCOUNTER — Ambulatory Visit: Payer: BC Managed Care – PPO

## 2021-05-26 DIAGNOSIS — K219 Gastro-esophageal reflux disease without esophagitis: Secondary | ICD-10-CM | POA: Diagnosis not present

## 2021-05-26 DIAGNOSIS — E785 Hyperlipidemia, unspecified: Secondary | ICD-10-CM | POA: Diagnosis not present

## 2021-05-26 DIAGNOSIS — C771 Secondary and unspecified malignant neoplasm of intrathoracic lymph nodes: Secondary | ICD-10-CM | POA: Insufficient documentation

## 2021-05-26 DIAGNOSIS — C801 Malignant (primary) neoplasm, unspecified: Secondary | ICD-10-CM | POA: Diagnosis not present

## 2021-05-26 DIAGNOSIS — Z87891 Personal history of nicotine dependence: Secondary | ICD-10-CM | POA: Insufficient documentation

## 2021-05-26 DIAGNOSIS — R918 Other nonspecific abnormal finding of lung field: Secondary | ICD-10-CM | POA: Diagnosis not present

## 2021-05-26 DIAGNOSIS — Z8501 Personal history of malignant neoplasm of esophagus: Secondary | ICD-10-CM | POA: Insufficient documentation

## 2021-05-26 DIAGNOSIS — Z85820 Personal history of malignant melanoma of skin: Secondary | ICD-10-CM | POA: Diagnosis not present

## 2021-05-26 DIAGNOSIS — G4730 Sleep apnea, unspecified: Secondary | ICD-10-CM | POA: Insufficient documentation

## 2021-05-26 DIAGNOSIS — R590 Localized enlarged lymph nodes: Secondary | ICD-10-CM | POA: Diagnosis not present

## 2021-05-26 DIAGNOSIS — I10 Essential (primary) hypertension: Secondary | ICD-10-CM | POA: Diagnosis not present

## 2021-05-26 DIAGNOSIS — R599 Enlarged lymph nodes, unspecified: Secondary | ICD-10-CM | POA: Insufficient documentation

## 2021-05-26 DIAGNOSIS — C158 Malignant neoplasm of overlapping sites of esophagus: Secondary | ICD-10-CM | POA: Diagnosis not present

## 2021-05-26 DIAGNOSIS — Z419 Encounter for procedure for purposes other than remedying health state, unspecified: Secondary | ICD-10-CM

## 2021-05-26 HISTORY — DX: Gastro-esophageal reflux disease without esophagitis: K21.9

## 2021-05-26 HISTORY — PX: VIDEO BRONCHOSCOPY WITH ENDOBRONCHIAL ULTRASOUND: SHX6177

## 2021-05-26 HISTORY — PX: FINE NEEDLE ASPIRATION: SHX5430

## 2021-05-26 SURGERY — BRONCHOSCOPY, WITH EBUS
Anesthesia: General

## 2021-05-26 SURGERY — BRONCHOSCOPY, WITH EBUS
Anesthesia: General | Laterality: Bilateral

## 2021-05-26 MED ORDER — ACETAMINOPHEN 10 MG/ML IV SOLN
1000.0000 mg | Freq: Once | INTRAVENOUS | Status: DC | PRN
  Filled 2021-05-26: qty 100

## 2021-05-26 MED ORDER — PROMETHAZINE HCL 25 MG/ML IJ SOLN: 6.2500 mg | INTRAMUSCULAR | Status: DC | PRN

## 2021-05-26 MED ORDER — CHLORHEXIDINE GLUCONATE 0.12 % MT SOLN
15.0000 mL | OROMUCOSAL | Status: DC
  Filled 2021-05-26: qty 15

## 2021-05-26 MED ORDER — FENTANYL CITRATE (PF) 100 MCG/2ML IJ SOLN: 25.0000 ug | INTRAMUSCULAR | Status: DC | PRN

## 2021-05-26 MED ORDER — FENTANYL CITRATE (PF) 250 MCG/5ML IJ SOLN
INTRAMUSCULAR | Status: DC | PRN
  Administered 2021-05-26: 50 ug via INTRAVENOUS

## 2021-05-26 MED ORDER — PROPOFOL 500 MG/50ML IV EMUL
INTRAVENOUS | Status: DC | PRN
  Administered 2021-05-26: 70 ug/kg/min via INTRAVENOUS

## 2021-05-26 MED ORDER — EPHEDRINE SULFATE-NACL 50-0.9 MG/10ML-% IV SOSY
PREFILLED_SYRINGE | INTRAVENOUS | Status: DC | PRN
  Administered 2021-05-26 (×2): 5 mg via INTRAVENOUS
  Administered 2021-05-26: 10 mg via INTRAVENOUS
  Administered 2021-05-26: 5 mg via INTRAVENOUS

## 2021-05-26 MED ORDER — LIDOCAINE HCL 2 % IJ SOLN
INTRAMUSCULAR | Status: DC | PRN
  Administered 2021-05-26: 6 mL

## 2021-05-26 MED ORDER — PROPOFOL 10 MG/ML IV BOLUS
INTRAVENOUS | Status: DC | PRN
  Administered 2021-05-26: 50 mg via INTRAVENOUS
  Administered 2021-05-26: 150 mg via INTRAVENOUS

## 2021-05-26 MED ORDER — LIDOCAINE HCL 2 % IJ SOLN
INTRAMUSCULAR | Status: AC
  Filled 2021-05-26: qty 20

## 2021-05-26 MED ORDER — ONDANSETRON HCL 4 MG/2ML IJ SOLN
INTRAMUSCULAR | Status: DC | PRN
  Administered 2021-05-26: 4 mg via INTRAVENOUS

## 2021-05-26 MED ORDER — LACTATED RINGERS IV SOLN: INTRAVENOUS | Status: DC

## 2021-05-26 MED ORDER — PHENYLEPHRINE 40 MCG/ML (10ML) SYRINGE FOR IV PUSH (FOR BLOOD PRESSURE SUPPORT)
PREFILLED_SYRINGE | INTRAVENOUS | Status: DC | PRN
  Administered 2021-05-26: 40 ug via INTRAVENOUS
  Administered 2021-05-26 (×2): 80 ug via INTRAVENOUS

## 2021-05-26 MED ORDER — LACTATED RINGERS IV SOLN: INTRAVENOUS | Status: DC | PRN

## 2021-05-26 MED ORDER — DEXAMETHASONE SODIUM PHOSPHATE 10 MG/ML IJ SOLN
INTRAMUSCULAR | Status: DC | PRN
  Administered 2021-05-26: 10 mg via INTRAVENOUS

## 2021-05-26 SURGICAL SUPPLY — 29 items

## 2021-05-26 NOTE — Anesthesia Procedure Notes (Signed)
Procedure Name: LMA Insertion Date/Time: 05/26/2021 9:14 AM Performed by: Janene Harvey, CRNA Pre-anesthesia Checklist: Patient identified, Emergency Drugs available, Suction available and Patient being monitored Patient Re-evaluated:Patient Re-evaluated prior to induction Oxygen Delivery Method: Circle system utilized Preoxygenation: Pre-oxygenation with 100% oxygen Induction Type: IV induction LMA: LMA inserted LMA Size: 5.0 Placement Confirmation: positive ETCO2 Dental Injury: Teeth and Oropharynx as per pre-operative assessment

## 2021-05-26 NOTE — Op Note (Signed)
Video Bronchoscopy with Endobronchial Ultrasound Procedure Note  Date of Operation: 05/26/2021  Pre-op Diagnosis: Mediastinal adenopathy, history of esophageal cancer  Post-op Diagnosis: Mediastinal adenopathy history of esophageal cancer  Surgeon: Garner Nash, DO   Assistants: none   Anesthesia: General endotracheal anesthesia  Operation: Flexible video fiberoptic bronchoscopy with endobronchial ultrasound and biopsies.  Estimated Blood Loss: Minimal  Complications: None   Indications and History: [facility] is a 65 y.o. male with mediastinal adenopathy, abnormal PET scan, history of esophageal cancer.  The risks, benefits, complications, treatment options and expected outcomes were discussed with the patient.  The possibilities of pneumothorax, pneumonia, reaction to medication, pulmonary aspiration, perforation of a viscus, bleeding, failure to diagnose a condition and creating a complication requiring transfusion or operation were discussed with the patient who freely signed the consent.    Description of Procedure: The patient was examined in the preoperative area and history and data from the preprocedure consultation were reviewed. It was deemed appropriate to proceed.  The patient was taken to [facility] endoscopy room 3, identified as [facility] and the procedure verified as Flexible Video Fiberoptic Bronchoscopy.  A Time Out was held and the above information confirmed. After being taken to the operating room general anesthesia was initiated and the patient had an LMA placed.  Using topical lidocaine the vocal cords, trachea and airways were anesthetized.  The video fiberoptic bronchoscope was introduced via the endotracheal tube and a general inspection was performed which showed normal right and left lung anatomy no evidence of endobronchial lesion. The standard scope was then withdrawn and the endobronchial ultrasound was used to identify and characterize the peritracheal,  hilar and bronchial lymph nodes. Inspection showed a small 1.2 cm lymph node within the station 2R, high along the posterior lateral wall of the trachea. Using real-time ultrasound guidance Wang needle biopsies were take from Station 2R nodes and were sent for cytology. The patient tolerated the procedure well without apparent complications. There was no significant blood loss. The bronchoscope was withdrawn. Anesthesia was reversed and the patient was taken to the PACU for recovery.   Samples: 1. Wang needle biopsies from 2R node  Plans:  The patient will be discharged from the PACU to home when recovered from anesthesia. We will review the cytology, pathology and microbiology results with the patient when they become available. Outpatient followup will be with Dr. Burr Medico.   Garner Nash, DO [facility] 05/26/2021 10:17 AM

## 2021-05-26 NOTE — H&P (Signed)
Synopsis: Referred in August 2022 for adenopathy by No ref. provider found  Subjective:   PATIENT ID: Nathaniel Jennings GENDER: male DOB: [birth year], MRN: [id number]  No chief complaint on file.   This is a 65 year old gentleman, history of hypertension, sleep apnea, GERD.  Also has a history of melanoma.  In 2011.  He was recently diagnosed with esophageal cancer.  Work-up revealed under PET scan a paratracheal/paraesophageal high node that has lit up concerning for potential for metastatic disease.  Case was reviewed medical thoracic oncology conference along with medical oncology and thoracic surgery.  Decision was made for bronchoscopy with endobronchial ultrasound with transbronchial needle aspirations for the high mediastinal node.  Patient presents today for evaluation of this and decisions for biopsy.  We discussed the risk benefits and alternatives and the patient is agreeable to proceed.   Past Medical History:  Diagnosis Date   BPH (benign prostatic hyperplasia)    Dyslipidemia    Elevated PSA    Esophageal cancer (HCC)    Full dentures    GERD (gastroesophageal reflux disease)    History of melanoma excision    07/ 2011  s/p  wide excision left back--- per pathology-- negative maligant --  mild melanocytic atypia   History of palpitations    cardiologist --  dr Shelva Majestic (last note in epic 2014   Hypertension    Sleep apnea      Family History  Problem Relation Age of Onset   Kidney failure Mother        died age 52   Pneumonia Father        died age 73     Past Surgical History:  Procedure Laterality Date   COLONOSCOPY  10/24/2007   ESOPHAGOGASTRODUODENOSCOPY (EGD) WITH PROPOFOL N/A 05/11/2021   Procedure: ESOPHAGOGASTRODUODENOSCOPY (EGD) WITH PROPOFOL;  Surgeon: Arta Silence, MD;  Location: [facility];  Service: Endoscopy;  Laterality: N/A;   EUS N/A 05/11/2021   Procedure: ESOPHAGEAL ENDOSCOPIC ULTRASOUND (EUS) RADIAL;  Surgeon: Arta Silence, MD;   Location: [facility];  Service: Endoscopy;  Laterality: N/A;   EYE SURGERY     Lasik surgery on both eyes, but still needs glasses   PROSTATE BIOPSY N/A 07/02/2015   Procedure: BIOPSY TRANSRECTAL ULTRASONIC PROSTATE (TUBP);  Surgeon: Lowella Bandy, MD;  Location: [facility];  Service: Urology;  Laterality: N/A;   PROSTATE BIOPSY N/A 11/22/2015   Procedure: BIOPSY TRANSRECTAL ULTRASONIC PROSTATE (TUBP);  Surgeon: Cleon Gustin, MD;  Location: [facility];  Service: Urology;  Laterality: N/A;   TRANSTHORACIC ECHOCARDIOGRAM  11/19/2012   normal echo/  trivial TR   WIDE EXCISION MELANOMA LEFT BACK  05/02/2010    Social History   Socioeconomic History   Marital status: Single    Spouse name: Not on file   Number of children: 2   Years of education: Not on file   Highest education level: Not on file  Occupational History   Occupation: Architect  Tobacco Use   Smoking status: Former    Packs/day: 2.00    Years: 42.00    Pack years: 84.00    Types: Cigarettes    Quit date: 03/15/2011    Years since quitting: 10.2   Smokeless tobacco: Current    Types: Snuff   Tobacco comments:    occasional dip tobacco  Vaping Use   Vaping Use: Never used  Substance and Sexual Activity   Alcohol use: Yes    Alcohol/week: 2.0 standard drinks  Types: 2 Cans of beer per week    Comment: for 40 years   Drug use: Never   Sexual activity: Not on file  Other Topics Concern   Not on file  Social History Narrative   Lives alone.  Architect   Social Determinants of Radio broadcast assistant Strain: Low Risk    Difficulty of Paying Living Expenses: Not hard at all  Food Insecurity: No Food Insecurity   Worried About Charity fundraiser in the Last Year: Never true   Arboriculturist in the Last Year: Never true  Transportation Needs: No Transportation Needs   Lack of Transportation (Medical): No   Lack of Transportation (Non-Medical): No  Physical  Activity: Not on file  Stress: Not on file  Social Connections: Not on file  Intimate Partner Violence: Not on file     Allergies  Allergen Reactions   Bee Venom Anaphylaxis     Review of Systems  Constitutional:  Negative for chills, fever, malaise/fatigue and weight loss.  HENT:  Negative for hearing loss, sore throat and tinnitus.   Eyes:  Negative for blurred vision and double vision.  Respiratory:  Negative for cough, hemoptysis, sputum production, shortness of breath, wheezing and stridor.   Cardiovascular:  Negative for chest pain, palpitations, orthopnea, leg swelling and PND.  Gastrointestinal:  Negative for abdominal pain, constipation, diarrhea, heartburn, nausea and vomiting.  Genitourinary:  Negative for dysuria, hematuria and urgency.  Musculoskeletal:  Negative for joint pain and myalgias.  Skin:  Negative for itching and rash.  Neurological:  Negative for dizziness, tingling, weakness and headaches.  Endo/Heme/Allergies:  Negative for environmental allergies. Does not bruise/bleed easily.  Psychiatric/Behavioral:  Negative for depression. The patient is not nervous/anxious and does not have insomnia.   All other systems reviewed and are negative.   Objective:  Physical Exam Vitals reviewed.  Constitutional:      General: He is not in acute distress.    Appearance: He is well-developed. He is obese.  HENT:     Head: Normocephalic and atraumatic.  Eyes:     General: No scleral icterus.    Conjunctiva/sclera: Conjunctivae normal.     Pupils: Pupils are equal, round, and reactive to light.  Neck:     Vascular: No JVD.     Trachea: No tracheal deviation.  Cardiovascular:     Rate and Rhythm: Normal rate and regular rhythm.     Heart sounds: Normal heart sounds. No murmur heard. Pulmonary:     Effort: Pulmonary effort is normal. No tachypnea, accessory muscle usage or respiratory distress.     Breath sounds: Normal breath sounds. No stridor. No wheezing,  rhonchi or rales.  Abdominal:     General: Bowel sounds are normal. There is no distension.     Palpations: Abdomen is soft.     Tenderness: There is no abdominal tenderness.  Musculoskeletal:        General: No tenderness.     Cervical back: Neck supple.  Lymphadenopathy:     Cervical: No cervical adenopathy.  Skin:    General: Skin is warm and dry.     Capillary Refill: Capillary refill takes less than 2 seconds.     Findings: No rash.  Neurological:     Mental Status: He is alert and oriented to person, place, and time.  Psychiatric:        Behavior: Behavior normal.     Vitals:   05/25/21 1909 05/26/21 0653  BP:  125/71  Pulse:  60  Resp:  17  Temp:  97.8 F (36.6 C)  TempSrc:  Oral  SpO2:  100%  Weight: 76.2 kg 77.1 kg  Height: 5' 6.6" (1.692 m) 5' 6.5" (1.689 m)   100% on  RA BMI Readings from Last 3 Encounters:  05/26/21 27.03 kg/m  05/24/21 26.85 kg/m  05/23/21 26.85 kg/m   Wt Readings from Last 3 Encounters:  05/26/21 77.1 kg  05/24/21 76.6 kg  05/23/21 76.6 kg     CBC    Component Value Date/Time   WBC 4.9 05/23/2021 0921   WBC 7.7 04/28/2010 1030   RBC 4.22 05/23/2021 0921   HGB 14.6 05/23/2021 0921   HCT 40.4 05/23/2021 0921   PLT 202 05/23/2021 0921   MCV 95.7 05/23/2021 0921   MCH 34.6 (H) 05/23/2021 0921   MCHC 36.1 (H) 05/23/2021 0921   RDW 11.8 05/23/2021 0921   LYMPHSABS 1.1 05/23/2021 0921   MONOABS 0.7 05/23/2021 0921   EOSABS 0.1 05/23/2021 0921   BASOSABS 0.0 05/23/2021 0921      Chest Imaging:  Nuclear medicine pet imaging: PET avid lesion at distal esophagus and paraesophageal/paratracheal node.  Pulmonary Functions Testing Results: No flowsheet data found.  FeNO:   Pathology:  Echocardiogram:   Heart Catheterization:     Assessment & Plan:     ICD-10-CM   1. Adenopathy  R59.9    Added automatically from request for surgery 6316     65 year old gentleman with esophageal cancer and hypermetabolic  adenopathy with the mediastinum  Discussion: Discussed risk benefits and alternatives of proceeding with video bronchoscopy and transbronchial needle aspirations guided by ultrasound. Patient is agreeable to proceed and has signed consent.    Current Facility-Administered Medications:    chlorhexidine (PERIDEX) 0.12 % solution 15 mL, 15 mL, Mouth/Throat, NOW, Woodrum, Chelsey L, MD   lactated ringers infusion, , Intravenous, Continuous, Woodrum, Carlynn Herald, MD   Garner Nash, DO [facility] 05/26/2021 9:00 AM

## 2021-05-26 NOTE — Anesthesia Preprocedure Evaluation (Addendum)
Anesthesia Evaluation  Patient identified by MRN, date of birth, ID band Patient awake    Reviewed: Allergy & Precautions, NPO status , Patient's Chart, lab work & pertinent test results  Airway Mallampati: II  TM Distance: >3 FB Neck ROM: Full    Dental  (+) Upper Dentures, Lower Dentures   Pulmonary sleep apnea , former smoker,  Right sided lung mass    + decreased breath sounds      Cardiovascular hypertension, Pt. on medications and Pt. on home beta blockers  Rhythm:Regular Rate:Normal     Neuro/Psych negative neurological ROS  negative psych ROS   GI/Hepatic Neg liver ROS, GERD  ,  Endo/Other  negative endocrine ROS  Renal/GU negative Renal ROS  negative genitourinary   Musculoskeletal negative musculoskeletal ROS (+)   Abdominal (+)  Abdomen: soft. Bowel sounds: normal.  Peds  Hematology negative hematology ROS (+)   Anesthesia Other Findings   Reproductive/Obstetrics                             Anesthesia Physical Anesthesia Plan  ASA: 3  Anesthesia Plan: General   Post-op Pain Management:    Induction: Intravenous  PONV Risk Score and Plan: 2 and Ondansetron, Dexamethasone, Propofol infusion, Treatment may vary due to age or medical condition and Midazolam  Airway Management Planned: Mask and Oral ETT  Additional Equipment: None  Intra-op Plan:   Post-operative Plan: Extubation in OR  Informed Consent: I have reviewed the patients History and Physical, chart, labs and discussed the procedure including the risks, benefits and alternatives for the proposed anesthesia with the patient or authorized representative who has indicated his/her understanding and acceptance.     Dental advisory given  Plan Discussed with: CRNA  Anesthesia Plan Comments: (Lab Results      Component                Value               Date                      WBC                      4.9                  05/23/2021                HGB                      14.6                05/23/2021                HCT                      40.4                05/23/2021                MCV                      95.7                05/23/2021                PLT  202                 05/23/2021           Lab Results      Component                Value               Date                      NA                       140                 05/23/2021                K                        3.7                 05/23/2021                CO2                      29                  05/23/2021                GLUCOSE                  127 (H)             05/23/2021                BUN                      11                  05/23/2021                CREATININE               1.06                05/23/2021                CALCIUM                  9.6                 05/23/2021                GFRNONAA                 >60                 05/23/2021                GFRAA                                        04/28/2010            >60        The eGFR has been calculated using the MDRD equation. This calculation has not been validated in all clinical situations. eGFR's persistently <60 mL/min signify possible Chronic Kidney Disease.)  Anesthesia Quick Evaluation  

## 2021-05-26 NOTE — Interval H&P Note (Signed)
History and Physical Interval Note:  05/26/2021 9:04 AM  Nathaniel Jennings  has presented today for surgery, with the diagnosis of R sided lung mass.  The various methods of treatment have been discussed with the patient and family. After consideration of risks, benefits and other options for treatment, the patient has consented to  Procedure(s): [place] (N/A) as a surgical intervention.  The patient's history has been reviewed, patient examined, no change in status, stable for surgery.  I have reviewed the patient's chart and labs.  Questions were answered to the patient's satisfaction.     [place]

## 2021-05-26 NOTE — Discharge Instructions (Signed)
Flexible Bronchoscopy, Care After This sheet gives you information about how to care for yourself after your test. Your doctor may also give you more specific instructions. If you have problems or questions, contact your doctor. Follow these instructions at home: Eating and drinking Do not eat or drink anything (not even water) for 2 hours after your test, or until your numbing medicine (local anesthetic) wears off. When your numbness is gone and your cough and gag reflexes have come back, you may: Eat only soft foods. Slowly drink liquids. The day after the test, go back to your normal diet. Driving Do not drive for 24 hours if you were given a medicine to help you relax (sedative). Do not drive or use heavy machinery while taking prescription pain medicine. General instructions  Take over-the-counter and prescription medicines only as told by your doctor. Return to your normal activities as told. Ask what activities are safe for you. Do not use any products that have nicotine or tobacco in them. This includes cigarettes and e-cigarettes. If you need help quitting, ask your doctor. Keep all follow-up visits as told by your doctor. This is important. It is very important if you had a tissue sample (biopsy) taken. Get help right away if: You have shortness of breath that gets worse. You get light-headed. You feel like you are going to pass out (faint). You have chest pain. You cough up: More than a little blood. More blood than before. Summary Do not eat or drink anything (not even water) for 2 hours after your test, or until your numbing medicine wears off. Do not use cigarettes. Do not use e-cigarettes. Get help right away if you have chest pain.  This information is not intended to replace advice given to you by your health care provider. Make sure you discuss any questions you have with your health care provider. Document Released: 08/06/2009 Document Revised: 09/21/2017 Document  Reviewed: 10/27/2016 Elsevier Patient Education  2020 [employer].

## 2021-05-26 NOTE — Progress Notes (Signed)
Nathaniel Jennings called stating Nathaniel Jennings was having a bronchoscopy today and would not be able to come for his rad/onc treatment today.  I provided ehr with the rad/onc number for future reference.  I called the rad/onc tech and leet him know that Nathaniel Jennings would not be at his appt today.

## 2021-05-26 NOTE — Transfer of Care (Signed)
Immediate Anesthesia Transfer of Care Note  Patient: Nathaniel Jennings  Procedure(s) Performed: VIDEO BRONCHOSCOPY WITH ENDOBRONCHIAL ULTRASOUND FINE NEEDLE ASPIRATION (FNA) LINEAR  Patient Location: PACU  Anesthesia Type:General  Level of Consciousness: drowsy  Airway & Oxygen Therapy: Patient Spontanous Breathing and Patient connected to face mask oxygen  Post-op Assessment: Report given to RN and Post -op Vital signs reviewed and stable  Post vital signs: Reviewed  Last Vitals:  Vitals Value Taken Time  BP 112/67 05/26/21 1022  Temp    Pulse 93 05/26/21 1023  Resp 12 05/26/21 1023  SpO2 99 % 05/26/21 1023  Vitals shown include unvalidated device data.  Last Pain:  Vitals:   05/26/21 0701  TempSrc:   PainSc: 0-No pain      Patients Stated Pain Goal: 3 ([phone] [id number])  Complications: No notable events documented.

## 2021-05-27 ENCOUNTER — Institutional Professional Consult (permissible substitution): Payer: BC Managed Care – PPO | Admitting: Thoracic Surgery (Cardiothoracic Vascular Surgery)

## 2021-05-27 ENCOUNTER — Ambulatory Visit
Admission: RE | Admit: 2021-05-27 | Discharge: 2021-05-27 | Disposition: A | Discharge status: Home / Self Care | Payer: BC Managed Care – PPO | Source: Ambulatory Visit | Attending: Radiation Oncology | Admitting: Radiation Oncology

## 2021-05-27 ENCOUNTER — Other Ambulatory Visit: Payer: Self-pay

## 2021-05-27 ENCOUNTER — Encounter (HOSPITAL_COMMUNITY): Payer: Self-pay | Admitting: Pulmonary Disease

## 2021-05-27 VITALS — BP 112/70 | HR 70 | Resp 20 | Ht 66.0 in | Wt 168.0 lb

## 2021-05-27 DIAGNOSIS — C158 Malignant neoplasm of overlapping sites of esophagus: Secondary | ICD-10-CM

## 2021-05-27 DIAGNOSIS — Z510 Encounter for antineoplastic radiation therapy: Secondary | ICD-10-CM | POA: Diagnosis not present

## 2021-05-27 NOTE — Progress Notes (Signed)
[address]       [place] [address]             [phone]                    Claudy L Mankowski [place] Medical Record [id number] Date of Birth: [birth year]  Referring: Arta Silence, MD Primary Care: Cyndi Bender, PA-C Primary Cardiologist: None  Chief Complaint:    Chief Complaint  Patient presents with   Esophageal Cancer    Initial surgical consult     History of Present Illness:    Nathaniel Jennings 65 y.o. male referred for surgical evaluation of biopsy-proven moderately differentiated adenocarcinoma of the esophagus.  He originally presented with some dysphagia.  He has a long history of gastroesophageal reflux disease, was seen at [facility] gastroenterology where he was noted to have some right neck lymphadenopathy.  He underwent an EGD on 04/19/2021 and was noted to have a partially obstruction in his distal esophagus.  He is recently started his neoadjuvant chemoradiation.  His weight has been stable.  On his PET/CT he was also noted to have a thoracic inlet/upper neck lymph node that was next to the trachea.  He underwent a transbronchial biopsy with Dr. Valeta Harms on 05/26/2021.     Zubrod Score: At the time of surgery this patient's most appropriate activity status/level should be described as: '[x]'$     0    Normal activity, no symptoms '[]'$     1    Restricted in physical strenuous activity but ambulatory, able to do out light work '[]'$     2    Ambulatory and capable of self care, unable to do work activities, up and about               >50 % of waking hours                              '[]'$     3    Only limited self care, in bed greater than 50% of waking hours '[]'$     4    Completely disabled, no self care, confined to bed or chair '[]'$     5    Moribund   Past Medical History:  Diagnosis Date   BPH (benign prostatic hyperplasia)    Dyslipidemia    Elevated PSA    Esophageal cancer ([place])    Full dentures    GERD (gastroesophageal reflux disease)     History of melanoma excision    07/ 2011  s/p  wide excision left back--- per pathology-- negative maligant --  mild melanocytic atypia   History of palpitations    cardiologist --  dr Shelva Majestic (last note in epic 2014   Hypertension    Sleep apnea     Past Surgical History:  Procedure Laterality Date   COLONOSCOPY  10/24/2007   ESOPHAGOGASTRODUODENOSCOPY (EGD) WITH PROPOFOL N/A 05/11/2021   Procedure: ESOPHAGOGASTRODUODENOSCOPY (EGD) WITH PROPOFOL;  Surgeon: Arta Silence, MD;  Location: [facility];  Service: Endoscopy;  Laterality: N/A;   EUS N/A 05/11/2021   Procedure: ESOPHAGEAL ENDOSCOPIC ULTRASOUND (EUS) RADIAL;  Surgeon: Arta Silence, MD;  Location: [facility];  Service: Endoscopy;  Laterality: N/A;   EYE SURGERY     Lasik surgery on both eyes, but still needs glasses   FINE NEEDLE ASPIRATION  05/26/2021   Procedure: FINE NEEDLE ASPIRATION (FNA) LINEAR;  Surgeon: Valeta Harms,  Octavio Graves, DO;  Location: [facility];  Service: Pulmonary;;   PROSTATE BIOPSY N/A 07/02/2015   Procedure: BIOPSY TRANSRECTAL ULTRASONIC PROSTATE (TUBP);  Surgeon: Lowella Bandy, MD;  Location: [facility];  Service: Urology;  Laterality: N/A;   PROSTATE BIOPSY N/A 11/22/2015   Procedure: BIOPSY TRANSRECTAL ULTRASONIC PROSTATE (TUBP);  Surgeon: Cleon Gustin, MD;  [facility];  Service: Urology;  Laterality: N/A;   TRANSTHORACIC ECHOCARDIOGRAM  11/19/2012   normal echo/  trivial TR   VIDEO BRONCHOSCOPY WITH ENDOBRONCHIAL ULTRASOUND N/A 05/26/2021   Procedure: VIDEO BRONCHOSCOPY WITH ENDOBRONCHIAL ULTRASOUND;  Surgeon: Garner Nash, DO;  Location: [facility];  Service: Pulmonary;  Laterality: N/A;   WIDE EXCISION MELANOMA LEFT BACK  05/02/2010    Family History  Problem Relation Age of Onset   Kidney failure Mother        died age 61   Pneumonia Father        died age 66     Social History   Tobacco Use  Smoking Status Former   Packs/day: 2.00    Years: 42.00   Pack years: 84.00   Types: Cigarettes   Quit date: 03/15/2011   Years since quitting: 10.2  Smokeless Tobacco Current   Types: Snuff  Tobacco Comments   occasional dip tobacco    Social History   Substance and Sexual Activity  Alcohol Use Yes   Alcohol/week: 2.0 standard drinks   Types: 2 Cans of beer per week   Comment: for 40 years     Allergies  Allergen Reactions   Bee Venom Anaphylaxis    Current Outpatient Medications  Medication Sig Dispense Refill   aspirin EC 81 MG tablet Take 81 mg by mouth in the morning.     metoprolol succinate (TOPROL-XL) 100 MG 24 hr tablet Take 100 mg by mouth in the morning.     ondansetron (ZOFRAN) 8 MG tablet Take 1 tablet (8 mg total) by mouth 2 (two) times daily as needed for refractory nausea / vomiting. Start on day 3 after chemo. 30 tablet 1   pantoprazole (PROTONIX) 40 MG tablet Take 1 tablet (40 mg total) by mouth 2 (two) times daily. 30 tablet 1   prochlorperazine (COMPAZINE) 10 MG tablet Take 1 tablet (10 mg total) by mouth every 6 (six) hours as needed (Nausea or vomiting). 30 tablet 1   No current facility-administered medications for this visit.    Review of Systems  Constitutional: Negative.   Respiratory: Negative.    Cardiovascular: Negative.   Gastrointestinal:  Positive for heartburn. Negative for abdominal pain, nausea and vomiting.    PHYSICAL EXAMINATION: BP 112/70 (BP Location: Left Arm, Patient Position: Sitting)   Pulse 70   Resp 20   Ht '5\' 6"'$  (1.676 m)   Wt 168 lb (76.2 kg)   SpO2 96% Comment: RA  BMI 27.12 kg/m  Physical Exam Constitutional:      General: He is not in acute distress.    Appearance: Normal appearance. He is normal weight. He is not ill-appearing.  HENT:     Head: Normocephalic and atraumatic.  Eyes:     Extraocular Movements: Extraocular movements intact.  Cardiovascular:     Rate and Rhythm: Normal rate.  Pulmonary:     Effort: Pulmonary effort is normal.   Abdominal:     General: Abdomen is flat. There is no distension.  Musculoskeletal:        General: Normal range of motion.  Cervical back: Normal range of motion.  Skin:    General: Skin is warm and dry.  Neurological:     General: No focal deficit present.     Mental Status: He is alert and oriented to person, place, and time.    Diagnostic Studies & Laboratory data:    CT Scan: 05/05/2021. IMPRESSION: 1. The patient's known esophageal mass is not well visualized. There is mild irregular wall thickening of the distal esophagus. Correlate with previous clinical evaluation. 2. Indeterminate mildly enlarged high right paratracheal node near the thoracic inlet. No other mediastinal adenopathy. 3. No evidence of metastatic disease in the abdomen or pelvis. 4. Stable small pulmonary nodules bilaterally consistent with benign findings. 5. Cholelithiasis, small renal cysts and Aortic Atherosclerosis  PET/CT: 05/16/2021 1. Distal esophageal mass measuring about 6.2 cm in length, maximum SUV 13.1. 2. Mildly hypermetabolic and mildly enlarged upper right paratracheal lymph node, 1.3 cm in short axis with maximum SUV 3.6, concerning for malignant involvement. 3. Hypodense 1.1 cm right thyroid nodule has a maximum SUV substantially above that of the background thyroid. A significant minority of such nodules can harbor thyroid cancer. Recommend thyroid US and biopsy (ref: J Am Coll Radiol. 2015 Feb;12(2): 143-50). 4. 3 by 4 mm right lower lobe pulmonary nodule is similar to 05/05/2021, not hypermetabolic but below sensitive PET-CT size thresholds. Surveillance recommended. 5. Other imaging findings of potential clinical significance: Chronic left maxillary sinusitis. Aortic Atherosclerosis (ICD10-I70.0). Cholelithiasis. Prostatomegaly.  EGD/EUS: 05/11/2021 ENDOSCOPIC FINDING: Findings: A single mucosal nodule with a localized distribution was found in the middle third of the  esophagus. A large, fungating and ulcerating mass with bleeding and no stigmata of recent bleeding was found at the gastroesophageal junction, 33 cm from the incisors. The mass was partially obstructing and partially circumferential (involving two thirds of the lumen circumference). ENDOSONOGRAPHIC FINDING: A hypoechoic mass was found in the gastroesophageal junction. The lesion was partially circumferential (involving 75% of the lumen). The endosonographic borders were poorly-defined. There was sonographic evidence suggesting invasion into and through some portions of the muscularis propria (Layer 4). There was evidence of at least 3 discrete peritumoral lymph nodes. Tight GE Junction with tumor; unable to traverse the GE junction with either the EGD scope or the radial EUS scope.  Impression: - Mucosal nodule found in the esophagus. - Partially obstructing, malignant esophageal tumor was found at the gastroesophageal junction. - At least 3 discrete peritumoral lymph nodes - A mass was found in the gastroesophageal junction. A tissue diagnosis was obtained prior to this exam. This is of adenocarcinoma. This was staged T3 N2 Mx by endosonographic criteria. - Unable to traverse GE junction with either EGD scope or radial EUS scope.   Path: Moderately differentiated adenocarcinoma Radiation Hx: 6-week course started on a 12/12/2020.     I have independently reviewed the above radiology studies  and reviewed the findings with the patient.   Recent Lab Findings: Lab Results  Component Value Date   WBC 4.9 05/23/2021   HGB 14.6 05/23/2021   HCT 40.4 05/23/2021   PLT 202 05/23/2021   GLUCOSE 127 (H) 05/23/2021   CHOL 186 01/28/2008   TRIG 232 (H) 01/28/2008   HDL 38 (L) 01/28/2008   LDLCALC 102 (H) 01/28/2008   ALT 30 05/23/2021   AST 17 05/23/2021   NA 140 05/23/2021   K 3.7 05/23/2021   CL 105 05/23/2021   CREATININE 1.06 05/23/2021   BUN 11 05/23/2021   CO2 29 05/23/2021  TSH 0.872 05/17/2021   HGBA1C  11/22/2007    5.1 (NOTE)   The ADA recommends the following therapeutic goals for glycemic   control related to Hgb A1C measurement:   Goal of Therapy:   < 7.0% Hgb A1C   Action Suggested:  > 8.0% Hgb A1C   Ref:  Diabetes Care, 22, Suppl. 1, 1999      Problem List: Moderately differentiated adenocarcinoma of the esophagus.  33 cm from the incisors.  T3 N2 MX Right paratracheal lymph node.  Status post EBUS and biopsy. 4 mm right lower lobe pulmonary  Assessment / Plan:   65 year old male with a poorly differentiated adenocarcinoma of the esophagus and 33 cm from the incisors.  It is a T3 N2 MX stage IIIb.  He also has a 1 cm right paratracheal lymph node that is high in the thoracic inlet versus lower neck.  He recently underwent an ultrasound-guided biopsy of this via transbronchial ultrasound.  We discussed in detail and reviewed the timeline for his treatment course, with the eventual goal of undergoing a surgical resection.  Based on the location I think that he would be a candidate for robotic assisted Ivor Lewis esophagectomy with jejunostomy tube.  The preliminary results from ultrasound-guided biopsy of this paratracheal lymph node was negative will await final pathology.  That being said he also has some avidity in his thyroid and this could be related to that potentially.  Stressed the importance of maintaining a good weight during his chemo radiation course.  I will see him back in 1 month as a virtual visit.     I  spent 40 minutes with the patient face to face counseling and coordination of care.    Lajuana Matte 05/27/2021 3:17 PM

## 2021-05-27 NOTE — Anesthesia Postprocedure Evaluation (Signed)
Anesthesia Post Note  Patient: Nathaniel Jennings  Procedure(s) Performed: VIDEO BRONCHOSCOPY WITH ENDOBRONCHIAL ULTRASOUND FINE NEEDLE ASPIRATION (FNA) LINEAR     Patient location during evaluation: PACU Anesthesia Type: General Level of consciousness: awake and alert Pain management: pain level controlled Vital Signs Assessment: post-procedure vital signs reviewed and stable Respiratory status: spontaneous breathing, nonlabored ventilation, respiratory function stable and patient connected to nasal cannula oxygen Cardiovascular status: blood pressure returned to baseline and stable Postop Assessment: no apparent nausea or vomiting Anesthetic complications: no   No notable events documented.  Last Vitals:  Vitals:   05/26/21 1022 05/26/21 1036  BP: 112/67 116/80  Pulse: 83 85  Resp: 10 17  Temp: 36.4 C 36.6 C  SpO2: 98% 97%    Last Pain:  Vitals:   05/26/21 1036  TempSrc:   PainSc: 0-No pain                 Belenda Cruise P Vannah Nadal

## 2021-05-30 ENCOUNTER — Other Ambulatory Visit: Payer: Self-pay

## 2021-05-30 ENCOUNTER — Encounter: Payer: Self-pay | Admitting: Hematology

## 2021-05-30 ENCOUNTER — Ambulatory Visit
Admission: RE | Admit: 2021-05-30 | Discharge: 2021-05-30 | Disposition: A | Discharge status: Home / Self Care | Payer: BC Managed Care – PPO | Source: Ambulatory Visit | Attending: Radiation Oncology | Admitting: Radiation Oncology

## 2021-05-30 ENCOUNTER — Inpatient Hospital Stay: Payer: BC Managed Care – PPO

## 2021-05-30 ENCOUNTER — Inpatient Hospital Stay: Payer: BC Managed Care – PPO | Admitting: Hematology

## 2021-05-30 VITALS — BP 129/87 | HR 80 | Temp 97.6°F | Resp 17 | Wt 170.5 lb

## 2021-05-30 DIAGNOSIS — R1310 Dysphagia, unspecified: Secondary | ICD-10-CM | POA: Diagnosis not present

## 2021-05-30 DIAGNOSIS — Z7982 Long term (current) use of aspirin: Secondary | ICD-10-CM | POA: Diagnosis not present

## 2021-05-30 DIAGNOSIS — G4730 Sleep apnea, unspecified: Secondary | ICD-10-CM | POA: Diagnosis not present

## 2021-05-30 DIAGNOSIS — C158 Malignant neoplasm of overlapping sites of esophagus: Secondary | ICD-10-CM | POA: Diagnosis not present

## 2021-05-30 DIAGNOSIS — R972 Elevated prostate specific antigen [PSA]: Secondary | ICD-10-CM | POA: Diagnosis not present

## 2021-05-30 DIAGNOSIS — K5900 Constipation, unspecified: Secondary | ICD-10-CM | POA: Diagnosis not present

## 2021-05-30 DIAGNOSIS — E041 Nontoxic single thyroid nodule: Secondary | ICD-10-CM | POA: Diagnosis not present

## 2021-05-30 DIAGNOSIS — Z85820 Personal history of malignant melanoma of skin: Secondary | ICD-10-CM | POA: Diagnosis not present

## 2021-05-30 DIAGNOSIS — Z79899 Other long term (current) drug therapy: Secondary | ICD-10-CM | POA: Diagnosis not present

## 2021-05-30 DIAGNOSIS — Z5111 Encounter for antineoplastic chemotherapy: Secondary | ICD-10-CM | POA: Diagnosis not present

## 2021-05-30 DIAGNOSIS — I10 Essential (primary) hypertension: Secondary | ICD-10-CM | POA: Diagnosis not present

## 2021-05-30 DIAGNOSIS — Z510 Encounter for antineoplastic radiation therapy: Secondary | ICD-10-CM | POA: Diagnosis not present

## 2021-05-30 LAB — CBC WITH DIFFERENTIAL (CANCER CENTER ONLY)
Abs Immature Granulocytes: 0.06 10*3/uL (ref 0.00–0.07)
Basophils Absolute: 0 10*3/uL (ref 0.0–0.1)
Basophils Relative: 1 %
Eosinophils Absolute: 0.2 10*3/uL (ref 0.0–0.5)
Eosinophils Relative: 4 %
HCT: 38.4 % — ABNORMAL LOW (ref 39.0–52.0)
Hemoglobin: 14 g/dL (ref 13.0–17.0)
Immature Granulocytes: 2 %
Lymphocytes Relative: 13 %
Lymphs Abs: 0.5 10*3/uL — ABNORMAL LOW (ref 0.7–4.0)
MCH: 34.7 pg — ABNORMAL HIGH (ref 26.0–34.0)
MCHC: 36.5 g/dL — ABNORMAL HIGH (ref 30.0–36.0)
MCV: 95.3 fL (ref 80.0–100.0)
Monocytes Absolute: 0.3 10*3/uL (ref 0.1–1.0)
Monocytes Relative: 6 %
Neutro Abs: 3.1 10*3/uL (ref 1.7–7.7)
Neutrophils Relative %: 74 %
Platelet Count: 191 10*3/uL (ref 150–400)
RBC: 4.03 MIL/uL — ABNORMAL LOW (ref 4.22–5.81)
RDW: 11.5 % (ref 11.5–15.5)
WBC Count: 4.1 10*3/uL (ref 4.0–10.5)
nRBC: 0 % (ref 0.0–0.2)

## 2021-05-30 LAB — CMP (CANCER CENTER ONLY)
ALT: 24 U/L (ref 0–44)
AST: 14 U/L — ABNORMAL LOW (ref 15–41)
Albumin: 3.8 g/dL (ref 3.5–5.0)
Alkaline Phosphatase: 64 U/L (ref 38–126)
Anion gap: 9 (ref 5–15)
BUN: 16 mg/dL (ref 8–23)
CO2: 26 mmol/L (ref 22–32)
Calcium: 9.3 mg/dL (ref 8.9–10.3)
Chloride: 103 mmol/L (ref 98–111)
Creatinine: 0.87 mg/dL (ref 0.61–1.24)
GFR, Estimated: >60 mL/min (ref >60)
Glucose, Bld: 108 mg/dL — ABNORMAL HIGH (ref 70–99)
Potassium: 4.2 mmol/L (ref 3.5–5.1)
Sodium: 138 mmol/L (ref 135–145)
Total Bilirubin: 0.8 mg/dL (ref 0.3–1.2)
Total Protein: 6.8 g/dL (ref 6.5–8.1)

## 2021-05-30 NOTE — Progress Notes (Addendum)
[place]   Telephone:(336) [phone] Fax:(336) [phone]   Clinic Follow up Note   Patient Care Team: [name] as PCP - General (Physician Assistant)  Date of Service:  05/30/2021  CHIEF COMPLAINT: f/u of esophageal cancer  SUMMARY OF ONCOLOGIC HISTORY: Oncology History  Malignant neoplasm of overlapping sites of esophagus ([place])  04/18/2021 Imaging   ULTRASOUND OF HEAD/NECK SOFT TISSUES  IMPRESSION: No suspicious lymphadenopathy   04/19/2021 Procedure   EGD  Impression: - Esophageal polyp(s) were found. Biopsied. - Partially obstructing, likely malignant esophageal tumor was found in the distal esophagus. - Small hiatal hernia. - A single gastric polyp. Biopsied. - Normal duodenal bulb, first portion of the duodenum and second portion of the duodenum.   04/19/2021 Pathology Results   FINAL MICROSCOPIC DIAGNOSIS: Stomach, Biopsy - FUNDIC GLAND POLYP. Negative for dysplasia. NO Helicobacter pylori organisms seen on H and E stain. Esophagus- Distal, Biopsy - INVASIVE MODERATELY DIFFERENTIATED ADENOCARCINOMA OF THE ESOPHAGUS.  - HER2 Study by Immunostain: Negative (score 1+) Esophagus- Mid, Biopsy - INVASIVE MODERATELY DIFFERENTIATED ADENOCARCINOMA OF THE ESOPHAGUS. - HER2 Study by Immunostain: Negative (score 1+)   05/05/2021 Imaging   CT CAP  IMPRESSION: 1. The patient's known esophageal mass is not well visualized. There is mild irregular wall thickening of the distal esophagus. Correlate with previous clinical evaluation. 2. Indeterminate mildly enlarged high right paratracheal node near the thoracic inlet. No other mediastinal adenopathy. 3. No evidence of metastatic disease in the abdomen or pelvis. 4. Stable small pulmonary nodules bilaterally consistent with benign findings. 5. Cholelithiasis, small renal cysts and Aortic Atherosclerosis (ICD10-I70.0).   05/09/2021 Initial Diagnosis   Malignant neoplasm of overlapping sites of esophagus  ([place])    05/11/2021 Procedure   Upper EUS  Impression: - Mucosal nodule found in the esophagus. - Partially obstructing, malignant esophageal tumor was found at the gastroesophageal junction. - At least 3 discrete peritumoral lymph nodes - A mass was found in the gastroesophageal junction. A tissue diagnosis was obtained prior to this exam. This is of adenocarcinoma. This was staged T3 N2 Mx by endosonographic criteria. - Unable to traverse GE junction with either EGD scope or radial EUS scope.   05/13/2021 Cancer Staging   Staging form: Esophagus - Adenocarcinoma, AJCC 8th Edition - Clinical stage from 05/13/2021: Stage IVA (cT3, cN2, cM0) - Signed by [name], MD on 05/22/2021    05/16/2021 PET scan   IMPRESSION: 1. Distal esophageal mass measuring about 6.2 cm in length, maximum SUV 13.1. 2. Mildly hypermetabolic and mildly enlarged upper right paratracheal lymph node, 1.3 cm in short axis with maximum SUV 3.6, concerning for malignant involvement. 3. Hypodense 1.1 cm right thyroid nodule has a maximum SUV substantially above that of the background thyroid. A significant minority of such nodules can harbor thyroid cancer. Recommend thyroid US and biopsy (ref: J Am Coll Radiol. 2015 Feb;12(2): 143-50). 4. 3 by 4 mm right lower lobe pulmonary nodule is similar to 05/05/2021, not hypermetabolic but below sensitive PET-CT size thresholds. Surveillance recommended. 5. Other imaging findings of potential clinical significance: Chronic left maxillary sinusitis. Aortic Atherosclerosis (ICD10-I70.0). Cholelithiasis. Prostatomegaly.   05/24/2021 -  Chemotherapy    Patient is on Treatment Plan: ESOPHAGUS CARBOPLATIN/PACLITAXEL WEEKLY X 6 WEEKS WITH XRT            CURRENT THERAPY:  Concurrent chemoRT with TC, beginning 05/24/21  INTERVAL HISTORY:  Nathaniel Jennings is here for a follow up of esophageal cancer. He was last seen by me  on 05/23/21. He presents to the clinic accompanied by his  wife. She reports he has been having difficulty with constipation.   All other systems were reviewed with the patient and are negative.  MEDICAL HISTORY:  Past Medical History:  Diagnosis Date   BPH (benign prostatic hyperplasia)    Dyslipidemia    Elevated PSA    Esophageal cancer ([name])    Full dentures    GERD (gastroesophageal reflux disease)    History of melanoma excision    07/ 2011  s/p  wide excision left back--- per pathology-- negative maligant --  mild melanocytic atypia   History of palpitations    cardiologist --  dr Shelva Majestic (last note in epic 2014   Hypertension    Sleep apnea     SURGICAL HISTORY: Past Surgical History:  Procedure Laterality Date   COLONOSCOPY  10/24/2007   ESOPHAGOGASTRODUODENOSCOPY (EGD) WITH PROPOFOL N/A 05/11/2021   Procedure: ESOPHAGOGASTRODUODENOSCOPY (EGD) WITH PROPOFOL;  Surgeon: Arta Silence, MD;  [facility];  Service: Endoscopy;  Laterality: N/A;   EUS N/A 05/11/2021   Procedure: ESOPHAGEAL ENDOSCOPIC ULTRASOUND (EUS) RADIAL;  Surgeon: Arta Silence, MD;  [facility];  Service: Endoscopy;  Laterality: N/A;   EYE SURGERY     Lasik surgery on both eyes, but still needs glasses   FINE NEEDLE ASPIRATION  05/26/2021   Procedure: FINE NEEDLE ASPIRATION (FNA) LINEAR;  Surgeon: [place]ner Nash, DO;  Location: [facility];  Service: Pulmonary;;   PROSTATE BIOPSY N/A 07/02/2015   Procedure: BIOPSY TRANSRECTAL ULTRASONIC PROSTATE (TUBP);  Surgeon: Lowella Bandy, MD;  Location: [facility];  Service: Urology;  Laterality: N/A;   PROSTATE BIOPSY N/A 11/22/2015   Procedure: BIOPSY TRANSRECTAL ULTRASONIC PROSTATE (TUBP);  Surgeon: Cleon Gustin, MD;  Location[facility];  Service: Urology;  Laterality: N/A;   TRANSTHORACIC ECHOCARDIOGRAM  11/19/2012   normal echo/  trivial TR   VIDEO BRONCHOSCOPY WITH ENDOBRONCHIAL ULTRASOUND N/A 05/26/2021   Procedure: VIDEO BRONCHOSCOPY WITH  ENDOBRONCHIAL ULTRASOUND;  Surgeon: [place] Nash, DO;  Location: [facility];  Service: Pulmonary;  Laterality: N/A;   WIDE EXCISION MELANOMA LEFT BACK  05/02/2010    I have reviewed the social history and family history with the patient and they are unchanged from previous note.  ALLERGIES:  is allergic to bee venom.  MEDICATIONS:  Current Outpatient Medications  Medication Sig Dispense Refill   aspirin EC 81 MG tablet Take 81 mg by mouth in the morning.     metoprolol succinate (TOPROL-XL) 100 MG 24 hr tablet Take 100 mg by mouth in the morning.     ondansetron (ZOFRAN) 8 MG tablet Take 1 tablet (8 mg total) by mouth 2 (two) times daily as needed for refractory nausea / vomiting. Start on day 3 after chemo. 30 tablet 1   pantoprazole (PROTONIX) 40 MG tablet Take 1 tablet (40 mg total) by mouth 2 (two) times daily. 30 tablet 1   prochlorperazine (COMPAZINE) 10 MG tablet Take 1 tablet (10 mg total) by mouth every 6 (six) hours as needed (Nausea or vomiting). 30 tablet 1   No current facility-administered medications for this visit.    PHYSICAL EXAMINATION: ECOG PERFORMANCE STATUS: 1 - Symptomatic but completely ambulatory  Vitals:   05/30/21 0929  BP: 129/87  Pulse: 80  Resp: 17  Temp: 97.6 F (36.4 C)  SpO2: 99%   Wt Readings from Last 3 Encounters:  05/30/21 170 lb 8 oz (77.3 kg)  05/27/21 168  lb (76.2 kg)  05/26/21 170 lb (77.1 kg)     GENERAL:alert, no distress and comfortable SKIN: skin color normal, no rashes or significant lesions EYES: normal, Conjunctiva are pink and non-injected, sclera clear  NEURO: alert & oriented x 3 with fluent speech  LABORATORY DATA:  I have reviewed the data as listed CBC Latest Ref Rng & Units 05/30/2021 05/23/2021 05/13/2021  WBC 4.0 - 10.5 K/uL 4.1 4.9 3.9(L)  Hemoglobin 13.0 - 17.0 g/dL 14.0 14.6 15.0  Hematocrit 39.0 - 52.0 % 38.4(L) 40.4 40.3  Platelets 150 - 400 K/uL 191 202 207     CMP Latest Ref Rng & Units 05/30/2021  05/23/2021 05/13/2021  Glucose 70 - 99 mg/dL 108(H) 127(H) 97  BUN 8 - 23 mg/dL _0 Creatinine 0.61 - 1.24 mg/dL 0.87 1.06 0.86  Sodium 135 - 145 mmol/L 138 140 142  Potassium 3.5 - 5.1 mmol/L 4.2 3.7 4.0  Chloride 98 - 111 mmol/L 103 105 104  CO2 22 - 32 mmol/L _1 Calcium 8.9 - 10.3 mg/dL 9.3 9.6 10.2  Total Protein 6.5 - 8.1 g/dL 6.8 7.0 7.5  Total Bilirubin 0.3 - 1.2 mg/dL 0.8 0.8 0.7  Alkaline Phos 38 - 126 U/L 64 68 59  AST 15 - 41 U/L 14(L) 17 26  ALT 0 - 44 U/L 24 30 37      RADIOGRAPHIC STUDIES: I have personally reviewed the radiological images as listed and agreed with the findings in the report. No results found.   ASSESSMENT & PLAN:  Nathaniel Jennings is a 65 y.o. male with   1. Two esophageal Adenocarcinoma in mid and distal esophagus, distal cT3N2Mx with upper mediastinal adenopathy -He was initially referred to [facility] for dysphasia with solid food and regurgitation. -EGD performed by Dr. Alessandra Bevels on 04/19/21 showed a partially obstructing, likely malignant, esophageal tumor in the distal esophagus and a polyp in the mid esophagus. Biopsy of both confirmed invasive moderately differentiated adenocarcinoma of the esophagus, Her2 negative. -CT CAP on 05/05/21 showing: esophageal mass not well visualized; indeterminate mildly-enlarged high right paratracheal node near thoracic inlet; no other mediastinal adenopathy or evidence of metastatic disease in abdomen or pelvis; stable small pulmonary nodules bilaterally. -EUS 05/11/21 staged this as T3 N2 Mx. -PET scan 05/16/21 showed: 6.2 cm distal esophageal mass; hypermetabolic upper right paratracheal lymph node.  No other distant metastasis.  -EBUS biopsy on 05/26/21, path results pending. -He began concurrent chemoRT with weekly TC on 05/24/21. He tolerated first week treatment well -Labs reviewed, adequate to proceed with treatment tomorrow.   2.  Dysphagia with mild weight loss -He lost about 15 lbs from April to  July 2022. -We previously discussed nutrition supplement, especially with liquid nutrition, such as Ensure, boost, Carnation breakfast. I also recommended Glucerna for watching his sugar. -He met with our nutritionist on 05/17/21 -His weight has been stable to slightly improved since the recent drop.  3. Constipation -he has developed worsening constipation. I recommend he try over-the-counter medications first, such as Miralax or [place]. They will contact us if these do not work.   3. Hypermetabolic thyroid nodule -seen on PET 05/16/21 (and not on head/neck US on 04/18/21) -previously ordered repeat head/neck US, not yet scheduled   4. HTN and BPH -continue medication, will monitor his blood pressure closely during treatment.     PLAN:  -continue daily radiation -proceed with C2 chemo tomorrow -labs and f/u in one week -will call him when  his biopsy returns  -will schedule his thyroid US    No problem-specific Assessment & Plan notes found for this encounter.   No orders of the defined types were placed in this encounter.  All questions were answered. The patient knows to call the clinic with any problems, questions or concerns. No barriers to learning was detected. The total time spent in the appointment was 30 minutes.     [name], MD 05/30/2021   I, Wilburn Mylar, am acting as scribe for [name], MD.   I have reviewed the above documentation for accuracy and completeness, and I agree with the above.

## 2021-05-31 ENCOUNTER — Encounter (HOSPITAL_COMMUNITY): Payer: BC Managed Care – PPO

## 2021-05-31 ENCOUNTER — Inpatient Hospital Stay: Payer: BC Managed Care – PPO | Admitting: Nutrition

## 2021-05-31 ENCOUNTER — Ambulatory Visit
Admission: RE | Admit: 2021-05-31 | Discharge: 2021-05-31 | Disposition: A | Discharge status: Home / Self Care | Payer: BC Managed Care – PPO | Source: Ambulatory Visit | Attending: Radiation Oncology | Admitting: Radiation Oncology

## 2021-05-31 ENCOUNTER — Inpatient Hospital Stay: Payer: BC Managed Care – PPO

## 2021-05-31 ENCOUNTER — Other Ambulatory Visit: Payer: Self-pay | Admitting: Hematology

## 2021-05-31 VITALS — BP 117/72 | HR 73 | Temp 98.1°F | Resp 16

## 2021-05-31 DIAGNOSIS — Z79899 Other long term (current) drug therapy: Secondary | ICD-10-CM | POA: Diagnosis not present

## 2021-05-31 DIAGNOSIS — Z85820 Personal history of malignant melanoma of skin: Secondary | ICD-10-CM | POA: Diagnosis not present

## 2021-05-31 DIAGNOSIS — Z7982 Long term (current) use of aspirin: Secondary | ICD-10-CM | POA: Diagnosis not present

## 2021-05-31 DIAGNOSIS — I10 Essential (primary) hypertension: Secondary | ICD-10-CM | POA: Diagnosis not present

## 2021-05-31 DIAGNOSIS — E041 Nontoxic single thyroid nodule: Secondary | ICD-10-CM | POA: Diagnosis not present

## 2021-05-31 DIAGNOSIS — Z510 Encounter for antineoplastic radiation therapy: Secondary | ICD-10-CM | POA: Diagnosis not present

## 2021-05-31 DIAGNOSIS — C158 Malignant neoplasm of overlapping sites of esophagus: Secondary | ICD-10-CM

## 2021-05-31 DIAGNOSIS — Z5111 Encounter for antineoplastic chemotherapy: Secondary | ICD-10-CM | POA: Diagnosis not present

## 2021-05-31 DIAGNOSIS — K5900 Constipation, unspecified: Secondary | ICD-10-CM | POA: Diagnosis not present

## 2021-05-31 DIAGNOSIS — R1310 Dysphagia, unspecified: Secondary | ICD-10-CM | POA: Diagnosis not present

## 2021-05-31 DIAGNOSIS — R972 Elevated prostate specific antigen [PSA]: Secondary | ICD-10-CM | POA: Diagnosis not present

## 2021-05-31 LAB — CYTOLOGY - NON PAP

## 2021-05-31 MED ORDER — PALONOSETRON HCL INJECTION 0.25 MG/5ML
INTRAVENOUS | Status: AC
  Filled 2021-05-31: qty 5

## 2021-05-31 MED ORDER — SODIUM CHLORIDE 0.9 % IV SOLN
50.0000 mg/m2 | Freq: Once | INTRAVENOUS | Status: AC
  Administered 2021-05-31: 96 mg via INTRAVENOUS
  Filled 2021-05-31: qty 16

## 2021-05-31 MED ORDER — FAMOTIDINE 20 MG IN NS 100 ML IVPB
20.0000 mg | Freq: Once | INTRAVENOUS | Status: AC
  Administered 2021-05-31: 20 mg via INTRAVENOUS

## 2021-05-31 MED ORDER — PALONOSETRON HCL INJECTION 0.25 MG/5ML
0.2500 mg | Freq: Once | INTRAVENOUS | Status: AC
  Administered 2021-05-31: 0.25 mg via INTRAVENOUS

## 2021-05-31 MED ORDER — DIPHENHYDRAMINE HCL 50 MG/ML IJ SOLN
INTRAMUSCULAR | Status: AC
  Filled 2021-05-31: qty 1

## 2021-05-31 MED ORDER — SODIUM CHLORIDE 0.9 % IV SOLN
Freq: Once | INTRAVENOUS | Status: AC
  Filled 2021-05-31: qty 250

## 2021-05-31 MED ORDER — SODIUM CHLORIDE 0.9 % IV SOLN
10.0000 mg | Freq: Once | INTRAVENOUS | Status: AC
  Administered 2021-05-31: 10 mg via INTRAVENOUS
  Filled 2021-05-31: qty 10

## 2021-05-31 MED ORDER — FAMOTIDINE 20 MG IN NS 100 ML IVPB
INTRAVENOUS | Status: AC
  Filled 2021-05-31: qty 100

## 2021-05-31 MED ORDER — SODIUM CHLORIDE 0.9 % IV SOLN
232.4000 mg | Freq: Once | INTRAVENOUS | Status: AC
  Administered 2021-05-31: 230 mg via INTRAVENOUS
  Filled 2021-05-31: qty 23

## 2021-05-31 MED ORDER — DIPHENHYDRAMINE HCL 50 MG/ML IJ SOLN
50.0000 mg | Freq: Once | INTRAMUSCULAR | Status: AC
  Administered 2021-05-31: 50 mg via INTRAVENOUS

## 2021-05-31 NOTE — Patient Instructions (Signed)
[facility]  Discharge Instructions: Thank you for choosing [facility] to provide your oncology and hematology care.   If you have a lab appointment with the Cancer Center, please go directly to the Cancer Center and check in at the registration area.   Wear comfortable clothing and clothing appropriate for easy access to any Portacath or PICC line.   We strive to give you quality time with your provider. You may need to reschedule your appointment if you arrive late (15 or more minutes).  Arriving late affects you and other patients whose appointments are after yours.  Also, if you miss three or more appointments without notifying the office, you may be dismissed from the clinic at the provider's discretion.      For prescription refill requests, have your pharmacy contact our office and allow 72 hours for refills to be completed.    Today you received the following chemotherapy and/or immunotherapy agents taxol/carboplatin     To help prevent nausea and vomiting after your treatment, we encourage you to take your nausea medication as directed.  BELOW ARE SYMPTOMS THAT SHOULD BE REPORTED IMMEDIATELY: *FEVER GREATER THAN 100.4 F (38 C) OR HIGHER *CHILLS OR SWEATING *NAUSEA AND VOMITING THAT IS NOT CONTROLLED WITH YOUR NAUSEA MEDICATION *UNUSUAL SHORTNESS OF BREATH *UNUSUAL BRUISING OR BLEEDING *URINARY PROBLEMS (pain or burning when urinating, or frequent urination) *BOWEL PROBLEMS (unusual diarrhea, constipation, pain near the anus) TENDERNESS IN MOUTH AND THROAT WITH OR WITHOUT PRESENCE OF ULCERS (sore throat, sores in mouth, or a toothache) UNUSUAL RASH, SWELLING OR PAIN  UNUSUAL VAGINAL DISCHARGE OR ITCHING   Items with * indicate a potential emergency and should be followed up as soon as possible or go to the Emergency Department if any problems should occur.  Please show the CHEMOTHERAPY ALERT CARD or IMMUNOTHERAPY ALERT CARD at  check-in to the Emergency Department and triage nurse.  Should you have questions after your visit or need to cancel or reschedule your appointment, please contact [facility]  Dept: [phone]  and follow the prompts.  Office hours are 8:00 a.m. to 4:30 p.m. Monday - Friday. Please note that voicemails left after 4:00 p.m. may not be returned until the following business day.  We are closed weekends and major holidays. You have access to a nurse at all times for urgent questions. Please call the main number to the clinic Dept: [phone] and follow the prompts.   For any non-urgent questions, you may also contact your provider using MyChart. We now offer e-Visits for anyone 18 and older to request care online for non-urgent symptoms. For details visit [email or web address].   Also download the MyChart app! Go to the app store, search "MyChart", open the app, select [facility], and log in with your MyChart username and password.  Due to Covid, a mask is required upon entering the hospital/clinic. If you do not have a mask, one will be given to you upon arrival. For doctor visits, patients may have 1 support person aged 18 or older with them. For treatment visits, patients cannot have anyone with them due to current Covid guidelines and our immunocompromised population.   

## 2021-05-31 NOTE — Progress Notes (Signed)
Nutrition follow-up completed with patient during infusion for esophageal cancer. Weight stable at 170.5 pounds August 8 from 168.8 pounds August 2. Labs reviewed.  Noted glucose 108. Patient noted to have constipation.  Reported taking MiraLAX yesterday but has not seen any results yet. Reports taste fatigue using nutrition supplements.  Nutrition diagnosis: Unintended weight loss improved.  Intervention: Educated patient on strategies for improving constipation.  Provided nutrition fact sheet. Increase free water. Recommended making shakes using Ensure Plus or equivalent. Continue small frequent meals and snacks with high-calorie foods.  Monitoring, evaluation, goals: Patient will tolerate increased calories and protein to continue weight maintenance.  Next visit: Monday, August 15 during infusion.  **Disclaimer: This note was dictated with voice recognition software. Similar sounding words can inadvertently be transcribed and this note may contain transcription errors which may not have been corrected upon publication of note.**

## 2021-06-01 ENCOUNTER — Other Ambulatory Visit: Payer: Self-pay

## 2021-06-01 ENCOUNTER — Ambulatory Visit
Admission: RE | Admit: 2021-06-01 | Discharge: 2021-06-01 | Disposition: A | Discharge status: Home / Self Care | Payer: BC Managed Care – PPO | Source: Ambulatory Visit | Attending: Radiation Oncology | Admitting: Radiation Oncology

## 2021-06-01 DIAGNOSIS — C158 Malignant neoplasm of overlapping sites of esophagus: Secondary | ICD-10-CM | POA: Diagnosis not present

## 2021-06-01 DIAGNOSIS — Z510 Encounter for antineoplastic radiation therapy: Secondary | ICD-10-CM | POA: Diagnosis not present

## 2021-06-02 ENCOUNTER — Ambulatory Visit
Admission: RE | Admit: 2021-06-02 | Discharge: 2021-06-02 | Disposition: A | Discharge status: Home / Self Care | Payer: BC Managed Care – PPO | Source: Ambulatory Visit | Attending: Radiation Oncology | Admitting: Radiation Oncology

## 2021-06-02 DIAGNOSIS — C158 Malignant neoplasm of overlapping sites of esophagus: Secondary | ICD-10-CM | POA: Diagnosis not present

## 2021-06-02 DIAGNOSIS — Z510 Encounter for antineoplastic radiation therapy: Secondary | ICD-10-CM | POA: Diagnosis not present

## 2021-06-03 ENCOUNTER — Ambulatory Visit (HOSPITAL_COMMUNITY)
Admission: RE | Admit: 2021-06-03 | Discharge: 2021-06-03 | Disposition: A | Discharge status: Home / Self Care | Payer: BC Managed Care – PPO | Source: Ambulatory Visit | Attending: Hematology | Admitting: Hematology

## 2021-06-03 ENCOUNTER — Ambulatory Visit
Admission: RE | Admit: 2021-06-03 | Discharge: 2021-06-03 | Disposition: A | Discharge status: Home / Self Care | Payer: BC Managed Care – PPO | Source: Ambulatory Visit | Attending: Radiation Oncology | Admitting: Radiation Oncology

## 2021-06-03 ENCOUNTER — Other Ambulatory Visit: Payer: Self-pay

## 2021-06-03 DIAGNOSIS — C158 Malignant neoplasm of overlapping sites of esophagus: Secondary | ICD-10-CM | POA: Insufficient documentation

## 2021-06-03 DIAGNOSIS — E041 Nontoxic single thyroid nodule: Secondary | ICD-10-CM | POA: Diagnosis not present

## 2021-06-03 DIAGNOSIS — Z510 Encounter for antineoplastic radiation therapy: Secondary | ICD-10-CM | POA: Diagnosis not present

## 2021-06-03 MED FILL — Dexamethasone Sodium Phosphate Inj 100 MG/10ML: INTRAMUSCULAR | Qty: 1 | Status: AC

## 2021-06-06 ENCOUNTER — Inpatient Hospital Stay: Payer: BC Managed Care – PPO

## 2021-06-06 ENCOUNTER — Inpatient Hospital Stay: Payer: BC Managed Care – PPO | Admitting: Nutrition

## 2021-06-06 ENCOUNTER — Encounter: Payer: Self-pay | Admitting: Hematology

## 2021-06-06 ENCOUNTER — Other Ambulatory Visit: Payer: Self-pay

## 2021-06-06 ENCOUNTER — Other Ambulatory Visit: Payer: Self-pay | Admitting: Radiation Oncology

## 2021-06-06 ENCOUNTER — Inpatient Hospital Stay (HOSPITAL_BASED_OUTPATIENT_CLINIC_OR_DEPARTMENT_OTHER): Payer: BC Managed Care – PPO | Admitting: Hematology

## 2021-06-06 ENCOUNTER — Ambulatory Visit
Admission: RE | Admit: 2021-06-06 | Discharge: 2021-06-06 | Disposition: A | Discharge status: Home / Self Care | Payer: BC Managed Care – PPO | Source: Ambulatory Visit | Attending: Radiation Oncology | Admitting: Radiation Oncology

## 2021-06-06 VITALS — BP 114/72 | HR 60 | Temp 98.4°F | Resp 18 | Ht 66.0 in | Wt 167.6 lb

## 2021-06-06 DIAGNOSIS — Z5111 Encounter for antineoplastic chemotherapy: Secondary | ICD-10-CM | POA: Diagnosis not present

## 2021-06-06 DIAGNOSIS — R972 Elevated prostate specific antigen [PSA]: Secondary | ICD-10-CM | POA: Diagnosis not present

## 2021-06-06 DIAGNOSIS — E041 Nontoxic single thyroid nodule: Secondary | ICD-10-CM | POA: Diagnosis not present

## 2021-06-06 DIAGNOSIS — Z79899 Other long term (current) drug therapy: Secondary | ICD-10-CM | POA: Diagnosis not present

## 2021-06-06 DIAGNOSIS — Z7982 Long term (current) use of aspirin: Secondary | ICD-10-CM | POA: Diagnosis not present

## 2021-06-06 DIAGNOSIS — R1310 Dysphagia, unspecified: Secondary | ICD-10-CM | POA: Diagnosis not present

## 2021-06-06 DIAGNOSIS — C158 Malignant neoplasm of overlapping sites of esophagus: Secondary | ICD-10-CM

## 2021-06-06 DIAGNOSIS — Z85820 Personal history of malignant melanoma of skin: Secondary | ICD-10-CM | POA: Diagnosis not present

## 2021-06-06 DIAGNOSIS — K5900 Constipation, unspecified: Secondary | ICD-10-CM | POA: Diagnosis not present

## 2021-06-06 DIAGNOSIS — I10 Essential (primary) hypertension: Secondary | ICD-10-CM | POA: Diagnosis not present

## 2021-06-06 DIAGNOSIS — Z510 Encounter for antineoplastic radiation therapy: Secondary | ICD-10-CM | POA: Diagnosis not present

## 2021-06-06 LAB — CBC WITH DIFFERENTIAL (CANCER CENTER ONLY)
Abs Immature Granulocytes: 0.05 10*3/uL (ref 0.00–0.07)
Basophils Absolute: 0 10*3/uL (ref 0.0–0.1)
Basophils Relative: 1 %
Eosinophils Absolute: 0.1 10*3/uL (ref 0.0–0.5)
Eosinophils Relative: 3 %
HCT: 36.7 % — ABNORMAL LOW (ref 39.0–52.0)
Hemoglobin: 13.5 g/dL (ref 13.0–17.0)
Immature Granulocytes: 2 %
Lymphocytes Relative: 10 %
Lymphs Abs: 0.3 10*3/uL — ABNORMAL LOW (ref 0.7–4.0)
MCH: 34.5 pg — ABNORMAL HIGH (ref 26.0–34.0)
MCHC: 36.8 g/dL — ABNORMAL HIGH (ref 30.0–36.0)
MCV: 93.9 fL (ref 80.0–100.0)
Monocytes Absolute: 0.3 10*3/uL (ref 0.1–1.0)
Monocytes Relative: 8 %
Neutro Abs: 2.4 10*3/uL (ref 1.7–7.7)
Neutrophils Relative %: 76 %
Platelet Count: 118 10*3/uL — ABNORMAL LOW (ref 150–400)
RBC: 3.91 MIL/uL — ABNORMAL LOW (ref 4.22–5.81)
RDW: 11.9 % (ref 11.5–15.5)
WBC Count: 3.2 10*3/uL — ABNORMAL LOW (ref 4.0–10.5)
nRBC: 0 % (ref 0.0–0.2)

## 2021-06-06 LAB — CMP (CANCER CENTER ONLY)
ALT: 21 U/L (ref 0–44)
AST: 14 U/L — ABNORMAL LOW (ref 15–41)
Albumin: 3.7 g/dL (ref 3.5–5.0)
Alkaline Phosphatase: 61 U/L (ref 38–126)
Anion gap: 9 (ref 5–15)
BUN: 18 mg/dL (ref 8–23)
CO2: 26 mmol/L (ref 22–32)
Calcium: 9.4 mg/dL (ref 8.9–10.3)
Chloride: 105 mmol/L (ref 98–111)
Creatinine: 0.83 mg/dL (ref 0.61–1.24)
GFR, Estimated: >60 mL/min (ref >60)
Glucose, Bld: 97 mg/dL (ref 70–99)
Potassium: 4.2 mmol/L (ref 3.5–5.1)
Sodium: 140 mmol/L (ref 135–145)
Total Bilirubin: 0.6 mg/dL (ref 0.3–1.2)
Total Protein: 6.5 g/dL (ref 6.5–8.1)

## 2021-06-06 MED ORDER — PACLITAXEL CHEMO INJECTION 300 MG/50ML
50.0000 mg/m2 | Freq: Once | INTRAVENOUS | Status: AC
  Administered 2021-06-06: 96 mg via INTRAVENOUS
  Filled 2021-06-06: qty 16

## 2021-06-06 MED ORDER — SUCRALFATE 1 G PO TABS: ORAL_TABLET | ORAL | 5 refills | Status: DC

## 2021-06-06 MED ORDER — SODIUM CHLORIDE 0.9 % IV SOLN
10.0000 mg | Freq: Once | INTRAVENOUS | Status: AC
  Administered 2021-06-06: 10 mg via INTRAVENOUS
  Filled 2021-06-06: qty 10

## 2021-06-06 MED ORDER — SODIUM CHLORIDE 0.9 % IV SOLN: Freq: Once | INTRAVENOUS | Status: AC

## 2021-06-06 MED ORDER — PALONOSETRON HCL INJECTION 0.25 MG/5ML
0.2500 mg | Freq: Once | INTRAVENOUS | Status: AC
  Administered 2021-06-06: 0.25 mg via INTRAVENOUS
  Filled 2021-06-06: qty 5

## 2021-06-06 MED ORDER — SODIUM CHLORIDE 0.9 % IV SOLN
230.0000 mg | Freq: Once | INTRAVENOUS | Status: AC
  Administered 2021-06-06: 230 mg via INTRAVENOUS
  Filled 2021-06-06: qty 23

## 2021-06-06 MED ORDER — FAMOTIDINE 20 MG IN NS 100 ML IVPB
20.0000 mg | Freq: Once | INTRAVENOUS | Status: AC
  Administered 2021-06-06: 20 mg via INTRAVENOUS
  Filled 2021-06-06: qty 100

## 2021-06-06 MED ORDER — DIPHENHYDRAMINE HCL 50 MG/ML IJ SOLN
50.0000 mg | Freq: Once | INTRAMUSCULAR | Status: AC
  Administered 2021-06-06: 50 mg via INTRAVENOUS
  Filled 2021-06-06: qty 1

## 2021-06-06 MED ORDER — LIDOCAINE VISCOUS HCL 2 % MT SOLN: OROMUCOSAL | 3 refills | Status: DC

## 2021-06-06 NOTE — Progress Notes (Signed)
Met with patient at registration to introduce myself as Arboriculturist and to offer available resources and to offer available resources.  Discussed one-time $1000 Radio broadcast assistant to assist with personal expenses while going through treatment.  Gave him my card if interested in applying and for any additional financial questions or concerns.

## 2021-06-06 NOTE — Progress Notes (Signed)
Nutrition follow-up completed with patient receiving infusion for esophageal cancer. Weight documented as 167.6 pounds August 15 down slightly from 170.5 pounds August 8. Labs were reviewed. Patient reports his nausea has improved.  His constipation also is improved. Currently denies nutrition impact symptoms. Reports he is drinking Ensure Plus shakes.  Nutrition diagnosis: Unintended weight loss continues.  Intervention: Encourage patient to continue adequate calorie protein intake to minimize further weight loss. Encouraged Ensure Plus or equivalent 2-3 cartons daily. Bowel regimen as needed. Continue increased frequent small meals and snacks.  Offered coupons.  Monitoring, evaluation, goals: Patient will tolerate adequate calories and protein to minimize weight loss.  Next visit: Tuesday, September 6 during infusion.  **Disclaimer: This note was dictated with voice recognition software. Similar sounding words can inadvertently be transcribed and this note may contain transcription errors which may not have been corrected upon publication of note.**

## 2021-06-06 NOTE — Progress Notes (Signed)
[facility]   [phone] [phone]   Clinic Follow up Note   Patient Care Team: Cyndi Bender, Hershal Coria as PCP - General (Physician Assistant) [name], MD as Consulting Physician (Oncology)  Date of Service:  06/06/2021  CHIEF COMPLAINT: f/u of esophageal cancer  SUMMARY OF ONCOLOGIC HISTORY: Oncology History  Malignant neoplasm of overlapping sites of esophagus ([place])  04/18/2021 Imaging   ULTRASOUND OF HEAD/NECK SOFT TISSUES  IMPRESSION: No suspicious lymphadenopathy   04/19/2021 Procedure   EGD  Impression: - Esophageal polyp(s) were found. Biopsied. - Partially obstructing, likely malignant esophageal tumor was found in the distal esophagus. - Small hiatal hernia. - A single gastric polyp. Biopsied. - Normal duodenal bulb, first portion of the duodenum and second portion of the duodenum.   04/19/2021 Pathology Results   FINAL MICROSCOPIC DIAGNOSIS: Stomach, Biopsy - FUNDIC GLAND POLYP. Negative for dysplasia. NO Helicobacter pylori organisms seen on H and E stain. Esophagus- Distal, Biopsy - INVASIVE MODERATELY DIFFERENTIATED ADENOCARCINOMA OF THE ESOPHAGUS.  - HER2 Study by Immunostain: Negative (score 1+) Esophagus- Mid, Biopsy - INVASIVE MODERATELY DIFFERENTIATED ADENOCARCINOMA OF THE ESOPHAGUS. - HER2 Study by Immunostain: Negative (score 1+)   05/05/2021 Imaging   CT CAP  IMPRESSION: 1. The patient's known esophageal mass is not well visualized. There is mild irregular wall thickening of the distal esophagus. Correlate with previous clinical evaluation. 2. Indeterminate mildly enlarged high right paratracheal node near the thoracic inlet. No other mediastinal adenopathy. 3. No evidence of metastatic disease in the abdomen or pelvis. 4. Stable small pulmonary nodules bilaterally consistent with benign findings. 5. Cholelithiasis, small renal cysts and Aortic Atherosclerosis (ICD10-I70.0).   05/09/2021 Initial Diagnosis    Malignant neoplasm of overlapping sites of esophagus ([place])   05/11/2021 Procedure   Upper EUS  Impression: - Mucosal nodule found in the esophagus. - Partially obstructing, malignant esophageal tumor was found at the gastroesophageal junction. - At least 3 discrete peritumoral lymph nodes - A mass was found in the gastroesophageal junction. A tissue diagnosis was obtained prior to this exam. This is of adenocarcinoma. This was staged T3 N2 Mx by endosonographic criteria. - Unable to traverse GE junction with either EGD scope or radial EUS scope.   05/13/2021 Cancer Staging   Staging form: Esophagus - Adenocarcinoma, AJCC 8th Edition - Clinical stage from 05/13/2021: Stage IVA (cT3, cN2, cM0) - Signed by [name], MD on 05/22/2021   05/16/2021 PET scan   IMPRESSION: 1. Distal esophageal mass measuring about 6.2 cm in length, maximum SUV 13.1. 2. Mildly hypermetabolic and mildly enlarged upper right paratracheal lymph node, 1.3 cm in short axis with maximum SUV 3.6, concerning for malignant involvement. 3. Hypodense 1.1 cm right thyroid nodule has a maximum SUV substantially above that of the background thyroid. A significant minority of such nodules can harbor thyroid cancer. Recommend thyroid US and biopsy (ref: J Am Coll Radiol. 2015 Feb;12(2): 143-50). 4. 3 by 4 mm right lower lobe pulmonary nodule is similar to 05/05/2021, not hypermetabolic but below sensitive PET-CT size thresholds. Surveillance recommended. 5. Other imaging findings of potential clinical significance: Chronic left maxillary sinusitis. Aortic Atherosclerosis (ICD10-I70.0). Cholelithiasis. Prostatomegaly.   05/24/2021 -  Chemotherapy    Patient is on Treatment Plan: ESOPHAGUS CARBOPLATIN/PACLITAXEL WEEKLY X 6 WEEKS WITH XRT         05/26/2021 Pathology Results   FINAL MICROSCOPIC DIAGNOSIS:   A. LYMPH NODE, 2R, FINE NEEDLE ASPIRATION:  - Malignant cells consistent with non-small cell carcinoma   COMMENT:  The  cells are positive for cytokeratin 20 and CDX-2 (patchy). TTF-1, NapsinA, cytokeratin 5/6, p40, S100, MelanA, and cytokeratin 7 are negative. The patient's history of esophageal adenocarcinoma is noted and CDX-2 positivity is consistent with a gastrointestinal primary. This cytokeratin7/20 profile is usually seen in lower tract tumors, but this pattern can be seen in gastric/esophageal cases.      CURRENT THERAPY:  Concurrent chemoRT with TC, beginning 05/24/21  INTERVAL HISTORY:  Nathaniel Jennings is here for a follow up of esophageal cancer. He was last seen by me on 05/30/21. He presents to the clinic accompanied by his wife. His wife notes he has not needed to use his anti-nausea medication. He relayed that Dr. Kipp Brood plans to perform surgery after treatment. They are also aware the positive lymph node will not be removed but is being targeted with radiation.   All other systems were reviewed with the patient and are negative.  MEDICAL HISTORY:  Past Medical History:  Diagnosis Date   BPH (benign prostatic hyperplasia)    Dyslipidemia    Elevated PSA    Esophageal cancer ([place])    Full dentures    GERD (gastroesophageal reflux disease)    History of melanoma excision    07/ 2011  s/p  wide excision left back--- per pathology-- negative maligant --  mild melanocytic atypia   History of palpitations    cardiologist --  dr Shelva Majestic (last note in epic 2014   Hypertension    Sleep apnea     SURGICAL HISTORY: Past Surgical History:  Procedure Laterality Date   COLONOSCOPY  10/24/2007   ESOPHAGOGASTRODUODENOSCOPY (EGD) WITH PROPOFOL N/A 05/11/2021   Procedure: ESOPHAGOGASTRODUODENOSCOPY (EGD) WITH PROPOFOL;  Surgeon: Arta Silence, MD;  Location: [facility];  Service: Endoscopy;  Laterality: N/A;   EUS N/A 05/11/2021   Procedure: ESOPHAGEAL ENDOSCOPIC ULTRASOUND (EUS) RADIAL;  Surgeon: Arta Silence, MD;  Location: [facility];  Service: Endoscopy;  Laterality: N/A;   EYE  SURGERY     Lasik surgery on both eyes, but still needs glasses   FINE NEEDLE ASPIRATION  05/26/2021   Procedure: FINE NEEDLE ASPIRATION (FNA) LINEAR;  Surgeon: Garner Nash, DO;  Location: [facility];  Service: Pulmonary;;   PROSTATE BIOPSY N/A 07/02/2015   Procedure: BIOPSY TRANSRECTAL ULTRASONIC PROSTATE (TUBP);  Surgeon: Lowella Bandy, MD;  Location: [facility];  Service: Urology;  Laterality: N/A;   PROSTATE BIOPSY N/A 11/22/2015   Procedure: BIOPSY TRANSRECTAL ULTRASONIC PROSTATE (TUBP);  Surgeon: Cleon Gustin, MD;  [facility];  Service: Urology;  Laterality: N/A;   TRANSTHORACIC ECHOCARDIOGRAM  11/19/2012   normal echo/  trivial TR   VIDEO BRONCHOSCOPY WITH ENDOBRONCHIAL ULTRASOUND N/A 05/26/2021   Procedure: VIDEO BRONCHOSCOPY WITH ENDOBRONCHIAL ULTRASOUND;  Surgeon: Garner Nash, DO;  Location: [facility];  Service: Pulmonary;  Laterality: N/A;   WIDE EXCISION MELANOMA LEFT BACK  05/02/2010    I have reviewed the social history and family history with the patient and they are unchanged from previous note.  ALLERGIES:  is allergic to bee venom.  MEDICATIONS:  Current Outpatient Medications  Medication Sig Dispense Refill   aspirin EC 81 MG tablet Take 81 mg by mouth in the morning.     lidocaine (XYLOCAINE) 2 % solution Patient: Mix 1part 2% viscous lidocaine, 1part H20. Swallow 82m of diluted mixture, 361m before meals and at bedtime, up to QID 200 mL 3   metoprolol succinate (TOPROL-XL) 100 MG 24 hr tablet Take 100  mg by mouth in the morning.     ondansetron (ZOFRAN) 8 MG tablet Take 1 tablet (8 mg total) by mouth 2 (two) times daily as needed for refractory nausea / vomiting. Start on day 3 after chemo. 30 tablet 1   pantoprazole (PROTONIX) 40 MG tablet Take 1 tablet (40 mg total) by mouth 2 (two) times daily. 30 tablet 1   prochlorperazine (COMPAZINE) 10 MG tablet Take 1 tablet (10 mg total) by mouth every 6 (six) hours as  needed (Nausea or vomiting). 30 tablet 1   sucralfate (CARAFATE) 1 g tablet Dissolve 1 tablet in 10 mL H20 and swallow up to QID prn soreness. 40 tablet 5   No current facility-administered medications for this visit.    PHYSICAL EXAMINATION: ECOG PERFORMANCE STATUS: 1 - Symptomatic but completely ambulatory  Vitals:   06/06/21 1009  BP: 114/72  Pulse: 60  Resp: 18  Temp: 98.4 F (36.9 C)  SpO2: 100%   Wt Readings from Last 3 Encounters:  06/06/21 167 lb 9.6 oz (76 kg)  05/30/21 170 lb 8 oz (77.3 kg)  05/27/21 168 lb (76.2 kg)     GENERAL:alert, no distress and comfortable SKIN: skin color normal, no rashes or significant lesions EYES: normal, Conjunctiva are pink and non-injected, sclera clear  NEURO: alert & oriented x 3 with fluent speech  LABORATORY DATA:  I have reviewed the data as listed CBC Latest Ref Rng & Units 06/06/2021 05/30/2021 05/23/2021  WBC 4.0 - 10.5 K/uL 3.2(L) 4.1 4.9  Hemoglobin 13.0 - 17.0 g/dL 13.5 14.0 14.6  Hematocrit 39.0 - 52.0 % 36.7(L) 38.4(L) 40.4  Platelets 150 - 400 K/uL 118(L) 191 202     CMP Latest Ref Rng & Units 05/30/2021 05/23/2021 05/13/2021  Glucose 70 - 99 mg/dL 108(H) 127(H) 97  BUN 8 - 23 mg/dL _0 Creatinine 0.61 - 1.24 mg/dL 0.87 1.06 0.86  Sodium 135 - 145 mmol/L 138 140 142  Potassium 3.5 - 5.1 mmol/L 4.2 3.7 4.0  Chloride 98 - 111 mmol/L 103 105 104  CO2 22 - 32 mmol/L _1 Calcium 8.9 - 10.3 mg/dL 9.3 9.6 10.2  Total Protein 6.5 - 8.1 g/dL 6.8 7.0 7.5  Total Bilirubin 0.3 - 1.2 mg/dL 0.8 0.8 0.7  Alkaline Phos 38 - 126 U/L 64 68 59  AST 15 - 41 U/L 14(L) 17 26  ALT 0 - 44 U/L 24 30 37      RADIOGRAPHIC STUDIES: I have personally reviewed the radiological images as listed and agreed with the findings in the report. No results found.   ASSESSMENT & PLAN:  Nathaniel Jennings is a 65 y.o. male with   1. Two esophageal Adenocarcinoma in mid and distal esophagus, distal cT3N2Mx with upper mediastinal  adenopathy -He was initially referred to [facility] for dysphasia with solid food and regurgitation. -EGD performed by Dr. Alessandra Bevels on 04/19/21 showed a partially obstructing, likely malignant, esophageal tumor in the distal esophagus and a polyp in the mid esophagus. Biopsy of both confirmed invasive moderately differentiated adenocarcinoma of the esophagus, Her2 negative. -CT CAP on 05/05/21 showing: esophageal mass not well visualized; indeterminate mildly-enlarged high right paratracheal node near thoracic inlet; no other mediastinal adenopathy or evidence of metastatic disease in abdomen or pelvis; stable small pulmonary nodules bilaterally. -EUS 05/11/21 staged this as T3 N2 Mx. -PET scan 05/16/21 showed: 6.2 cm distal esophageal mass; hypermetabolic upper right paratracheal lymph node.  No other distant metastasis.  -EBUS  biopsy on 05/26/21, path from 2R lymph node confirmed malignant cells consistent with his primary esophageal cancer. I reviewed these results with them today. -Due to his mets to 2R node which can not be removed by surgery, I recommend changing chemo to FOLFOX every 2 weeks with concurrent RT for better disease control, and possible more cycles after RT and before surgery.  Potential benefit and side effects especially cold sensitivity and neuropathy, were discussed with patient and his wife in detail, he agrees to proceed, plan to start next week. -He began concurrent chemoRT with weekly TC on 05/24/21. -Labs reviewed, adequate to proceed with C3 TC today   2.  Dysphagia with mild weight loss -He lost about 15 lbs from April to July 2022. -We previously discussed nutrition supplement, especially with liquid nutrition, such as Ensure, boost, Carnation breakfast. I also recommended Glucerna for watching his sugar. -He met with our nutritionist on 05/17/21 -His weight has been stable since the recent drop.   3. Constipation -he has developed worsening constipation. I recommend he try  over-the-counter medications first, such as Miralax or [place].  (-not mentioned 06/06/21)   3. Hypermetabolic thyroid nodule -seen on PET 05/16/21 (and not on head/neck US on 04/18/21) -previously ordered repeat head/neck US, not yet scheduled. He would like to postpone until after chemoRT, which I feel is okay.   4. HTN and BPH -continue medication, will monitor his blood pressure closely during treatment.     PLAN:  -proceed with C3 chemo carbo and Taxol today -continue daily radiation -labs and f/u in one week, plan to change his chemo to FOLFOX from next week -will schedule his thyroid US in 5-6 weeks, pt prefers after chemoRT    No problem-specific Assessment & Plan notes found for this encounter.   Orders Placed This Encounter  Procedures   [place] FNA BX THYROID 1ST LESION AFIRMA    Right thyroid nodule on recent US    Standing Status:   Future    Standing Expiration Date:   06/06/2022    Order Specific Question:   Reason for Exam (SYMPTOM  OR DIAGNOSIS REQUIRED)    Answer:   rule out thyroid malignancy    Order Specific Question:   Preferred location?    Answer:   [facility]    All questions were answered. The patient knows to call the clinic with any problems, questions or concerns. No barriers to learning was detected. The total time spent in the appointment was 30 minutes.     [name], MD 06/06/2021   I, Wilburn Mylar, am acting as scribe for [name], MD.   I have reviewed the above documentation for accuracy and completeness, and I agree with the above.

## 2021-06-06 NOTE — Patient Instructions (Signed)
[facility]  Discharge Instructions: Thank you for choosing [facility] to provide your oncology and hematology care.   If you have a lab appointment with the Cancer Center, please go directly to the Cancer Center and check in at the registration area.   Wear comfortable clothing and clothing appropriate for easy access to any Portacath or PICC line.   We strive to give you quality time with your provider. You may need to reschedule your appointment if you arrive late (15 or more minutes).  Arriving late affects you and other patients whose appointments are after yours.  Also, if you miss three or more appointments without notifying the office, you may be dismissed from the clinic at the provider's discretion.      For prescription refill requests, have your pharmacy contact our office and allow 72 hours for refills to be completed.    Today you received the following chemotherapy and/or immunotherapy agents: Paclitaxel (Taxol) and Carboplatin.   To help prevent nausea and vomiting after your treatment, we encourage you to take your nausea medication as directed.  BELOW ARE SYMPTOMS THAT SHOULD BE REPORTED IMMEDIATELY: *FEVER GREATER THAN 100.4 F (38 C) OR HIGHER *CHILLS OR SWEATING *NAUSEA AND VOMITING THAT IS NOT CONTROLLED WITH YOUR NAUSEA MEDICATION *UNUSUAL SHORTNESS OF BREATH *UNUSUAL BRUISING OR BLEEDING *URINARY PROBLEMS (pain or burning when urinating, or frequent urination) *BOWEL PROBLEMS (unusual diarrhea, constipation, pain near the anus) TENDERNESS IN MOUTH AND THROAT WITH OR WITHOUT PRESENCE OF ULCERS (sore throat, sores in mouth, or a toothache) UNUSUAL RASH, SWELLING OR PAIN  UNUSUAL VAGINAL DISCHARGE OR ITCHING   Items with * indicate a potential emergency and should be followed up as soon as possible or go to the Emergency Department if any problems should occur.  Please show the CHEMOTHERAPY ALERT CARD or IMMUNOTHERAPY  ALERT CARD at check-in to the Emergency Department and triage nurse.  Should you have questions after your visit or need to cancel or reschedule your appointment, please contact [facility]  Dept: [phone]  and follow the prompts.  Office hours are 8:00 a.m. to 4:30 p.m. Monday - Friday. Please note that voicemails left after 4:00 p.m. may not be returned until the following business day.  We are closed weekends and major holidays. You have access to a nurse at all times for urgent questions. Please call the main number to the clinic Dept: [phone] and follow the prompts.   For any non-urgent questions, you may also contact your provider using MyChart. We now offer e-Visits for anyone 18 and older to request care online for non-urgent symptoms. For details visit [email or web address].   Also download the MyChart app! Go to the app store, search "MyChart", open the app, select [place], and log in with your MyChart username and password.  Due to Covid, a mask is required upon entering the hospital/clinic. If you do not have a mask, one will be given to you upon arrival. For doctor visits, patients may have 1 support person aged 18 or older with them. For treatment visits, patients cannot have anyone with them due to current Covid guidelines and our immunocompromised population.   

## 2021-06-07 ENCOUNTER — Ambulatory Visit
Admission: RE | Admit: 2021-06-07 | Discharge: 2021-06-07 | Disposition: A | Discharge status: Home / Self Care | Payer: BC Managed Care – PPO | Source: Ambulatory Visit | Attending: Radiation Oncology | Admitting: Radiation Oncology

## 2021-06-07 DIAGNOSIS — C158 Malignant neoplasm of overlapping sites of esophagus: Secondary | ICD-10-CM | POA: Diagnosis not present

## 2021-06-07 DIAGNOSIS — Z510 Encounter for antineoplastic radiation therapy: Secondary | ICD-10-CM | POA: Diagnosis not present

## 2021-06-08 ENCOUNTER — Ambulatory Visit
Admission: RE | Admit: 2021-06-08 | Discharge: 2021-06-08 | Disposition: A | Discharge status: Home / Self Care | Payer: BC Managed Care – PPO | Source: Ambulatory Visit | Attending: Radiation Oncology | Admitting: Radiation Oncology

## 2021-06-08 ENCOUNTER — Other Ambulatory Visit: Payer: Self-pay

## 2021-06-08 DIAGNOSIS — Z510 Encounter for antineoplastic radiation therapy: Secondary | ICD-10-CM | POA: Diagnosis not present

## 2021-06-08 DIAGNOSIS — C158 Malignant neoplasm of overlapping sites of esophagus: Secondary | ICD-10-CM | POA: Diagnosis not present

## 2021-06-09 ENCOUNTER — Ambulatory Visit
Admission: RE | Admit: 2021-06-09 | Discharge: 2021-06-09 | Disposition: A | Discharge status: Home / Self Care | Payer: BC Managed Care – PPO | Source: Ambulatory Visit | Attending: Radiation Oncology | Admitting: Radiation Oncology

## 2021-06-09 DIAGNOSIS — C158 Malignant neoplasm of overlapping sites of esophagus: Secondary | ICD-10-CM | POA: Diagnosis not present

## 2021-06-09 DIAGNOSIS — Z510 Encounter for antineoplastic radiation therapy: Secondary | ICD-10-CM | POA: Diagnosis not present

## 2021-06-10 ENCOUNTER — Ambulatory Visit
Admission: RE | Admit: 2021-06-10 | Discharge: 2021-06-10 | Disposition: A | Discharge status: Home / Self Care | Payer: BC Managed Care – PPO | Source: Ambulatory Visit | Attending: Radiation Oncology | Admitting: Radiation Oncology

## 2021-06-10 ENCOUNTER — Other Ambulatory Visit: Payer: Self-pay

## 2021-06-10 DIAGNOSIS — C158 Malignant neoplasm of overlapping sites of esophagus: Secondary | ICD-10-CM | POA: Diagnosis not present

## 2021-06-10 DIAGNOSIS — Z510 Encounter for antineoplastic radiation therapy: Secondary | ICD-10-CM | POA: Diagnosis not present

## 2021-06-10 MED FILL — Dexamethasone Sodium Phosphate Inj 100 MG/10ML: INTRAMUSCULAR | Qty: 1 | Status: AC

## 2021-06-12 ENCOUNTER — Other Ambulatory Visit: Payer: Self-pay | Admitting: Hematology

## 2021-06-13 ENCOUNTER — Other Ambulatory Visit: Payer: Self-pay

## 2021-06-13 ENCOUNTER — Ambulatory Visit
Admission: RE | Admit: 2021-06-13 | Discharge: 2021-06-13 | Disposition: A | Discharge status: Home / Self Care | Payer: BC Managed Care – PPO | Source: Ambulatory Visit | Attending: Radiation Oncology | Admitting: Radiation Oncology

## 2021-06-13 ENCOUNTER — Inpatient Hospital Stay: Payer: BC Managed Care – PPO

## 2021-06-13 ENCOUNTER — Inpatient Hospital Stay: Payer: BC Managed Care – PPO | Admitting: Hematology

## 2021-06-13 VITALS — BP 115/77 | HR 95 | Temp 98.5°F | Resp 18 | Ht 66.0 in | Wt 164.7 lb

## 2021-06-13 DIAGNOSIS — C158 Malignant neoplasm of overlapping sites of esophagus: Secondary | ICD-10-CM | POA: Diagnosis not present

## 2021-06-13 DIAGNOSIS — E041 Nontoxic single thyroid nodule: Secondary | ICD-10-CM | POA: Diagnosis not present

## 2021-06-13 DIAGNOSIS — Z5111 Encounter for antineoplastic chemotherapy: Secondary | ICD-10-CM | POA: Diagnosis not present

## 2021-06-13 DIAGNOSIS — R972 Elevated prostate specific antigen [PSA]: Secondary | ICD-10-CM | POA: Diagnosis not present

## 2021-06-13 DIAGNOSIS — I10 Essential (primary) hypertension: Secondary | ICD-10-CM | POA: Diagnosis not present

## 2021-06-13 DIAGNOSIS — Z510 Encounter for antineoplastic radiation therapy: Secondary | ICD-10-CM | POA: Diagnosis not present

## 2021-06-13 DIAGNOSIS — Z7982 Long term (current) use of aspirin: Secondary | ICD-10-CM | POA: Diagnosis not present

## 2021-06-13 DIAGNOSIS — R1310 Dysphagia, unspecified: Secondary | ICD-10-CM | POA: Diagnosis not present

## 2021-06-13 DIAGNOSIS — Z85820 Personal history of malignant melanoma of skin: Secondary | ICD-10-CM | POA: Diagnosis not present

## 2021-06-13 DIAGNOSIS — Z79899 Other long term (current) drug therapy: Secondary | ICD-10-CM | POA: Diagnosis not present

## 2021-06-13 DIAGNOSIS — K5900 Constipation, unspecified: Secondary | ICD-10-CM | POA: Diagnosis not present

## 2021-06-13 LAB — CBC WITH DIFFERENTIAL (CANCER CENTER ONLY)
Abs Immature Granulocytes: 0.06 10*3/uL (ref 0.00–0.07)
Basophils Absolute: 0 10*3/uL (ref 0.0–0.1)
Basophils Relative: 0 %
Eosinophils Absolute: 0.1 10*3/uL (ref 0.0–0.5)
Eosinophils Relative: 1 %
HCT: 37.5 % — ABNORMAL LOW (ref 39.0–52.0)
Hemoglobin: 13.6 g/dL (ref 13.0–17.0)
Immature Granulocytes: 1 %
Lymphocytes Relative: 2 %
Lymphs Abs: 0.2 10*3/uL — ABNORMAL LOW (ref 0.7–4.0)
MCH: 34.4 pg — ABNORMAL HIGH (ref 26.0–34.0)
MCHC: 36.3 g/dL — ABNORMAL HIGH (ref 30.0–36.0)
MCV: 94.9 fL (ref 80.0–100.0)
Monocytes Absolute: 0.8 10*3/uL (ref 0.1–1.0)
Monocytes Relative: 12 %
Neutro Abs: 5.5 10*3/uL (ref 1.7–7.7)
Neutrophils Relative %: 84 %
Platelet Count: 100 10*3/uL — ABNORMAL LOW (ref 150–400)
RBC: 3.95 MIL/uL — ABNORMAL LOW (ref 4.22–5.81)
RDW: 12.3 % (ref 11.5–15.5)
WBC Count: 6.5 10*3/uL (ref 4.0–10.5)
nRBC: 0 % (ref 0.0–0.2)

## 2021-06-13 LAB — CMP (CANCER CENTER ONLY)
ALT: 18 U/L (ref 0–44)
AST: 12 U/L — ABNORMAL LOW (ref 15–41)
Albumin: 3.6 g/dL (ref 3.5–5.0)
Alkaline Phosphatase: 64 U/L (ref 38–126)
Anion gap: 11 (ref 5–15)
BUN: 16 mg/dL (ref 8–23)
CO2: 24 mmol/L (ref 22–32)
Calcium: 9.3 mg/dL (ref 8.9–10.3)
Chloride: 100 mmol/L (ref 98–111)
Creatinine: 0.87 mg/dL (ref 0.61–1.24)
GFR, Estimated: >60 mL/min (ref >60)
Glucose, Bld: 146 mg/dL — ABNORMAL HIGH (ref 70–99)
Potassium: 4 mmol/L (ref 3.5–5.1)
Sodium: 135 mmol/L (ref 135–145)
Total Bilirubin: 1.4 mg/dL — ABNORMAL HIGH (ref 0.3–1.2)
Total Protein: 7 g/dL (ref 6.5–8.1)

## 2021-06-13 MED ORDER — DIPHENHYDRAMINE HCL 50 MG/ML IJ SOLN
50.0000 mg | Freq: Once | INTRAMUSCULAR | Status: AC
  Administered 2021-06-13: 50 mg via INTRAVENOUS
  Filled 2021-06-13: qty 1

## 2021-06-13 MED ORDER — SODIUM CHLORIDE 0.9 % IV SOLN
232.4000 mg | Freq: Once | INTRAVENOUS | Status: AC
  Administered 2021-06-13: 230 mg via INTRAVENOUS
  Filled 2021-06-13: qty 23

## 2021-06-13 MED ORDER — SODIUM CHLORIDE 0.9 % IV SOLN
10.0000 mg | Freq: Once | INTRAVENOUS | Status: AC
  Administered 2021-06-13: 10 mg via INTRAVENOUS
  Filled 2021-06-13: qty 10

## 2021-06-13 MED ORDER — FAMOTIDINE 20 MG IN NS 100 ML IVPB
20.0000 mg | Freq: Once | INTRAVENOUS | Status: AC
  Administered 2021-06-13: 20 mg via INTRAVENOUS
  Filled 2021-06-13: qty 100

## 2021-06-13 MED ORDER — SODIUM CHLORIDE 0.9 % IV SOLN: Freq: Once | INTRAVENOUS | Status: AC

## 2021-06-13 MED ORDER — PALONOSETRON HCL INJECTION 0.25 MG/5ML
0.2500 mg | Freq: Once | INTRAVENOUS | Status: AC
  Administered 2021-06-13: 0.25 mg via INTRAVENOUS
  Filled 2021-06-13: qty 5

## 2021-06-13 MED ORDER — SODIUM CHLORIDE 0.9 % IV SOLN
50.0000 mg/m2 | Freq: Once | INTRAVENOUS | Status: AC
  Administered 2021-06-13: 96 mg via INTRAVENOUS
  Filled 2021-06-13: qty 16

## 2021-06-13 NOTE — Progress Notes (Signed)
Nathaniel Jennings   Telephone:(336) [phone] Fax:(336) [phone]   Clinic Follow up Note   Patient Care Team: Cyndi Bender, Hershal Coria as PCP - General (Physician Assistant) [name], MD as Consulting Physician (Oncology)  Date of Service:  06/13/2021  CHIEF COMPLAINT: f/u of esophageal cancer  SUMMARY OF ONCOLOGIC HISTORY: Oncology History  Malignant neoplasm of overlapping sites of esophagus ([place])  04/18/2021 Imaging   ULTRASOUND OF HEAD/NECK SOFT TISSUES  IMPRESSION: No suspicious lymphadenopathy   04/19/2021 Procedure   EGD  Impression: - Esophageal polyp(s) were found. Biopsied. - Partially obstructing, likely malignant esophageal tumor was found in the distal esophagus. - Small hiatal hernia. - A single gastric polyp. Biopsied. - Normal duodenal bulb, first portion of the duodenum and second portion of the duodenum.   04/19/2021 Pathology Results   FINAL MICROSCOPIC DIAGNOSIS: Stomach, Biopsy - FUNDIC GLAND POLYP. Negative for dysplasia. NO Helicobacter pylori organisms seen on H and E stain. Esophagus- Distal, Biopsy - INVASIVE MODERATELY DIFFERENTIATED ADENOCARCINOMA OF THE ESOPHAGUS.  - HER2 Study by Immunostain: Negative (score 1+) Esophagus- Mid, Biopsy - INVASIVE MODERATELY DIFFERENTIATED ADENOCARCINOMA OF THE ESOPHAGUS. - HER2 Study by Immunostain: Negative (score 1+)   05/05/2021 Imaging   CT CAP  IMPRESSION: 1. The patient's known esophageal mass is not well visualized. There is mild irregular wall thickening of the distal esophagus. Correlate with previous clinical evaluation. 2. Indeterminate mildly enlarged high right paratracheal node near the thoracic inlet. No other mediastinal adenopathy. 3. No evidence of metastatic disease in the abdomen or pelvis. 4. Stable small pulmonary nodules bilaterally consistent with benign findings. 5. Cholelithiasis, small renal cysts and Aortic Atherosclerosis (ICD10-I70.0).   05/09/2021 Initial Diagnosis    Malignant neoplasm of overlapping sites of esophagus ([place])   05/11/2021 Procedure   Upper EUS  Impression: - Mucosal nodule found in the esophagus. - Partially obstructing, malignant esophageal tumor was found at the gastroesophageal junction. - At least 3 discrete peritumoral lymph nodes - A mass was found in the gastroesophageal junction. A tissue diagnosis was obtained prior to this exam. This is of adenocarcinoma. This was staged T3 N2 Mx by endosonographic criteria. - Unable to traverse GE junction with either EGD scope or radial EUS scope.   05/13/2021 Cancer Staging   Staging form: Esophagus - Adenocarcinoma, AJCC 8th Edition - Clinical stage from 05/13/2021: Stage IVA (cT3, cN2, cM0) - Signed by [name], MD on 05/22/2021   05/16/2021 PET scan   IMPRESSION: 1. Distal esophageal mass measuring about 6.2 cm in length, maximum SUV 13.1. 2. Mildly hypermetabolic and mildly enlarged upper right paratracheal lymph node, 1.3 cm in short axis with maximum SUV 3.6, concerning for malignant involvement. 3. Hypodense 1.1 cm right thyroid nodule has a maximum SUV substantially above that of the background thyroid. A significant minority of such nodules can harbor thyroid cancer. Recommend thyroid US and biopsy (ref: J Am Coll Radiol. 2015 Feb;12(2): 143-50). 4. 3 by 4 mm right lower lobe pulmonary nodule is similar to 05/05/2021, not hypermetabolic but below sensitive PET-CT size thresholds. Surveillance recommended. 5. Other imaging findings of potential clinical significance: Chronic left maxillary sinusitis. Aortic Atherosclerosis (ICD10-I70.0). Cholelithiasis. Prostatomegaly.   05/24/2021 -  Chemotherapy    Patient is on Treatment Plan: ESOPHAGUS CARBOPLATIN/PACLITAXEL WEEKLY X 6 WEEKS WITH XRT         05/26/2021 Pathology Results   FINAL MICROSCOPIC DIAGNOSIS:   A. LYMPH NODE, 2R, FINE NEEDLE ASPIRATION:  - Malignant cells consistent with non-small cell carcinoma   COMMENT:  The  cells are positive for cytokeratin 20 and CDX-2 (patchy). TTF-1, NapsinA, cytokeratin 5/6, p40, S100, MelanA, and cytokeratin 7 are negative. The patient's history of esophageal adenocarcinoma is noted and CDX-2 positivity is consistent with a gastrointestinal primary. This cytokeratin7/20 profile is usually seen in lower tract tumors, but this pattern can be seen in gastric/esophageal cases.      CURRENT THERAPY:  Concurrent chemoRT with TC, beginning 05/24/21  INTERVAL HISTORY:  Nathaniel Jennings is here for a follow up of esophageal cancer. He was last seen by me on 06/06/21. He presents to the clinic accompanied by his wife. He reports some trouble swallowing, sometimes with burning. His wife notes the Carafate he was given is not helping and is causing more issues. He has not yet tried the lidocaine. He notes continued pain from his thyroid ultrasound.  He notes his bowel movements are regular. His last BM was yesterday morning.   All other systems were reviewed with the patient and are negative.  MEDICAL HISTORY:  Past Medical History:  Diagnosis Date   BPH (benign prostatic hyperplasia)    Dyslipidemia    Elevated PSA    Esophageal cancer ([place])    Full dentures    GERD (gastroesophageal reflux disease)    History of melanoma excision    07/ 2011  s/p  wide excision left back--- per pathology-- negative maligant --  mild melanocytic atypia   History of palpitations    cardiologist --  dr Shelva Majestic (last note in epic 2014   Hypertension    Sleep apnea     SURGICAL HISTORY: Past Surgical History:  Procedure Laterality Date   COLONOSCOPY  10/24/2007   ESOPHAGOGASTRODUODENOSCOPY (EGD) WITH PROPOFOL N/A 05/11/2021   Procedure: ESOPHAGOGASTRODUODENOSCOPY (EGD) WITH PROPOFOL;  Surgeon: Arta Silence, MD;  Location: [facility];  Service: Endoscopy;  Laterality: N/A;   EUS N/A 05/11/2021   Procedure: ESOPHAGEAL ENDOSCOPIC ULTRASOUND (EUS) RADIAL;  Surgeon: Arta Silence, MD;   Location: [facility];  Service: Endoscopy;  Laterality: N/A;   EYE SURGERY     Lasik surgery on both eyes, but still needs glasses   FINE NEEDLE ASPIRATION  05/26/2021   Procedure: FINE NEEDLE ASPIRATION (FNA) LINEAR;  Surgeon: Garner Nash, DO;  Location: [place];  Service: Pulmonary;;   PROSTATE BIOPSY N/A 07/02/2015   Procedure: BIOPSY TRANSRECTAL ULTRASONIC PROSTATE (TUBP);  Surgeon: Lowella Bandy, MD;  Location: [facility];  Service: Urology;  Laterality: N/A;   PROSTATE BIOPSY N/A 11/22/2015   Procedure: BIOPSY TRANSRECTAL ULTRASONIC PROSTATE (TUBP);  Surgeon: Cleon Gustin, MD;  Location: [facility];  Service: Urology;  Laterality: N/A;   TRANSTHORACIC ECHOCARDIOGRAM  11/19/2012   normal echo/  trivial TR   VIDEO BRONCHOSCOPY WITH ENDOBRONCHIAL ULTRASOUND N/A 05/26/2021   Procedure: VIDEO BRONCHOSCOPY WITH ENDOBRONCHIAL ULTRASOUND;  Surgeon: Garner Nash, DO;  Location: [place];  Service: Pulmonary;  Laterality: N/A;   WIDE EXCISION MELANOMA LEFT BACK  05/02/2010    I have reviewed the social history and family history with the patient and they are unchanged from previous note.  ALLERGIES:  is allergic to bee venom.  MEDICATIONS:  Current Outpatient Medications  Medication Sig Dispense Refill   aspirin EC 81 MG tablet Take 81 mg by mouth in the morning.     lidocaine (XYLOCAINE) 2 % solution Patient: Mix 1part 2% viscous lidocaine, 1part H20. Swallow 32m of diluted mixture, 3101m before meals and at bedtime, up to QID 200 mL 3  metoprolol succinate (TOPROL-XL) 100 MG 24 hr tablet Take 100 mg by mouth in the morning.     ondansetron (ZOFRAN) 8 MG tablet Take 1 tablet (8 mg total) by mouth 2 (two) times daily as needed for refractory nausea / vomiting. Start on day 3 after chemo. 30 tablet 1   pantoprazole (PROTONIX) 40 MG tablet Take 1 tablet (40 mg total) by mouth 2 (two) times daily. 30 tablet 1   prochlorperazine (COMPAZINE) 10  MG tablet Take 1 tablet (10 mg total) by mouth every 6 (six) hours as needed (Nausea or vomiting). 30 tablet 1   sucralfate (CARAFATE) 1 g tablet Dissolve 1 tablet in 10 mL H20 and swallow up to QID prn soreness. 40 tablet 5   No current facility-administered medications for this visit.   Facility-Administered Medications Ordered in Other Visits  Medication Dose Route Frequency Provider Last Rate Last Admin   CARBOplatin (PARAPLATIN) 230 mg in sodium chloride 0.9 % 100 mL chemo infusion  230 mg Intravenous Once [name], MD       PACLitaxel (TAXOL) 96 mg in sodium chloride 0.9 % 250 mL chemo infusion (</= 54m/m2)  50 mg/m2 (Treatment Plan Recorded) Intravenous Once FTruitt Merle MD        PHYSICAL EXAMINATION: ECOG PERFORMANCE STATUS: 1 - Symptomatic but completely ambulatory  Vitals:   06/13/21 0834  BP: 115/77  Pulse: 95  Resp: 18  Temp: 98.5 F (36.9 C)  SpO2: 98%   Wt Readings from Last 3 Encounters:  06/13/21 164 lb 11.2 oz (74.7 kg)  06/06/21 167 lb 9.6 oz (76 kg)  05/30/21 170 lb 8 oz (77.3 kg)     GENERAL:alert, no distress and comfortable SKIN: skin color normal, no rashes or significant lesions EYES: normal, Conjunctiva are pink and non-injected, sclera clear  NEURO: alert & oriented x 3 with fluent speech  LABORATORY DATA:  I have reviewed the data as listed CBC Latest Ref Rng & Units 06/13/2021 06/06/2021 05/30/2021  WBC 4.0 - 10.5 K/uL 6.5 3.2(L) 4.1  Hemoglobin 13.0 - 17.0 g/dL 13.6 13.5 14.0  Hematocrit 39.0 - 52.0 % 37.5(L) 36.7(L) 38.4(L)  Platelets 150 - 400 K/uL 100(L) 118(L) 191     CMP Latest Ref Rng & Units 06/13/2021 06/06/2021 05/30/2021  Glucose 70 - 99 mg/dL 146(H) 97 108(H)  BUN 8 - 23 mg/dL _0 Creatinine 0.61 - 1.24 mg/dL 0.87 0.83 0.87  Sodium 135 - 145 mmol/L 135 140 138  Potassium 3.5 - 5.1 mmol/L 4.0 4.2 4.2  Chloride 98 - 111 mmol/L 100 105 103  CO2 22 - 32 mmol/L _1 Calcium 8.9 - 10.3 mg/dL 9.3 9.4 9.3  Total Protein 6.5 -  8.1 g/dL 7.0 6.5 6.8  Total Bilirubin 0.3 - 1.2 mg/dL 1.4(H) 0.6 0.8  Alkaline Phos 38 - 126 U/L 64 61 64  AST 15 - 41 U/L 12(L) 14(L) 14(L)  ALT 0 - 44 U/L _2 RADIOGRAPHIC STUDIES: I have personally reviewed the radiological images as listed and agreed with the findings in the report. No results found.   ASSESSMENT & PLAN:  Nathaniel SPOSITOis a 65y.o. male with   1. Two esophageal Adenocarcinoma in mid and distal esophagus, distal cT3N2Mx with upper mediastinal adenopathy -[facility] and [facility]urgitation. -EGD performed by Dr. BAlessandra Bevelson 04/19/21 showed a partially obstructing, likely malignant, esophageal tumor in the  distal esophagus and a polyp in the mid esophagus. Biopsy of both confirmed invasive moderately differentiated adenocarcinoma of the esophagus, Her2 negative. -CT CAP on 05/05/21 showing: esophageal mass not well visualized; indeterminate mildly-enlarged high right paratracheal node near thoracic inlet; no other mediastinal adenopathy or evidence of metastatic disease in abdomen or pelvis; stable small pulmonary nodules bilaterally. -EUS 05/11/21 staged this as T3 N2 Mx. -PET scan 05/16/21 showed: 6.2 cm distal esophageal mass; hypermetabolic upper right paratracheal lymph node.  No other distant metastasis.  -EBUS biopsy on 05/26/21, path from 2R lymph node confirmed malignant cells consistent with his primary esophageal cancer.  -He began concurrent chemoRT with weekly TC on 05/24/21. -We had planned to change his chemo to FOLFOX today, however the orders were not entered. He will receive one more cycle of TC, and we will switch him to FOLFOX next week. -Labs reviewed, adequate to proceed with C4 TC today   2.  Dysphagia with mild weight loss -He lost about 15 lbs from April to July 2022. -He has developed burning with swallowing, secondary to radiation therapy, Carafate is not helpful. -He is supplementing  with Boost. Due to the pain with swallowing, he is only drinking one a day (previously 3). His weight is down 3 lbs in the last week.   3. Constipation -he had previously developed worsening constipation. I recommended he try over-the-counter medications first, such as Miralax or [place].  -I advised him to use Miralax if he does not have a bowel movement every day.   3. Hypermetabolic thyroid nodule -seen on PET 05/16/21 (and not on head/neck US on 04/18/21) -repeat head/neck US on 06/03/21 showed a 1.1 cm inferior right nodule, corresponding to hypermetabolic focus on PET.  -He is scheduled for biopsy of the nodule on 07/19/21.   4. HTN and BPH -continue medication, will monitor his blood pressure closely during treatment.     PLAN:  -proceed with C4 chemo carbo and Taxol today -continue daily radiation -labs and f/u in one week, plan to change his chemo to FOLFOX from next week   No problem-specific Assessment & Plan notes found for this encounter.   No orders of the defined types were placed in this encounter.  All questions were answered. The patient knows to call the clinic with any problems, questions or concerns. No barriers to learning was detected. The total time spent in the appointment was 30 minutes.     [name], MD 06/13/2021   I, Wilburn Mylar, am acting as scribe for [name], MD.   I have reviewed the above documentation for accuracy and completeness, and I agree with the above.

## 2021-06-13 NOTE — Progress Notes (Signed)
DISCONTINUE ON PATHWAY REGIMEN - Gastroesophageal     Administer weekly during RT:     Paclitaxel      Carboplatin   **Always confirm dose/schedule in your pharmacy ordering system**  REASON: Other Reason PRIOR TREATMENT: [id number]: Carboplatin + Paclitaxel (2/50) Weekly (x 5-6 Weeks) with Concurrent RT TREATMENT RESPONSE: Unable to Evaluate  START OFF PATHWAY REGIMEN - Gastroesophageal   [id number] (Leucovorin IV D1 + Fluorouracil IV D1/CIV D1,2 + Oxaliplatin IV D1) q14 Days:   A cycle is every 14 days:     Oxaliplatin      Leucovorin      Fluorouracil      Fluorouracil   **Always confirm dose/schedule in your pharmacy ordering system**  Patient Characteristics: Esophageal & GE Junction, Adenocarcinoma, Preoperative or Nonsurgical Candidate (Clinical Staging), cT2 or Higher or cN+, Unresectable/Nonsurgical Candidate (Any cT) Histology: Adenocarcinoma Disease Classification: Esophageal Therapeutic Status: Preoperative or Nonsurgical Candidate (Clinical Staging) AJCC Grade: G2 AJCC 8 Stage Grouping: Unknown AJCC T Category: cTX AJCC N Category: cN1 AJCC M Category: cM0 Intent of Therapy: Curative Intent, Discussed with Patient

## 2021-06-13 NOTE — Patient Instructions (Signed)
[facility] MEDICAL ONCOLOGY  Discharge Instructions: Thank you for choosing [facility] to provide your oncology and hematology care.   If you have a lab appointment with the Cancer Center, please go directly to the Cancer Center and check in at the registration area.   Wear comfortable clothing and clothing appropriate for easy access to any Portacath or PICC line.   We strive to give you quality time with your provider. You may need to reschedule your appointment if you arrive late (15 or more minutes).  Arriving late affects you and other patients whose appointments are after yours.  Also, if you miss three or more appointments without notifying the office, you may be dismissed from the clinic at the provider's discretion.      For prescription refill requests, have your pharmacy contact our office and allow 72 hours for refills to be completed.    Today you received the following chemotherapy and/or immunotherapy agents: Paclitaxel (Taxol) and Carboplatin.   To help prevent nausea and vomiting after your treatment, we encourage you to take your nausea medication as directed.  BELOW ARE SYMPTOMS THAT SHOULD BE REPORTED IMMEDIATELY: *FEVER GREATER THAN 100.4 F (38 C) OR HIGHER *CHILLS OR SWEATING *NAUSEA AND VOMITING THAT IS NOT CONTROLLED WITH YOUR NAUSEA MEDICATION *UNUSUAL SHORTNESS OF BREATH *UNUSUAL BRUISING OR BLEEDING *URINARY PROBLEMS (pain or burning when urinating, or frequent urination) *BOWEL PROBLEMS (unusual diarrhea, constipation, pain near the anus) TENDERNESS IN MOUTH AND THROAT WITH OR WITHOUT PRESENCE OF ULCERS (sore throat, sores in mouth, or a toothache) UNUSUAL RASH, SWELLING OR PAIN  UNUSUAL VAGINAL DISCHARGE OR ITCHING   Items with * indicate a potential emergency and should be followed up as soon as possible or go to the Emergency Department if any problems should occur.  Please show the CHEMOTHERAPY ALERT CARD or IMMUNOTHERAPY  ALERT CARD at check-in to the Emergency Department and triage nurse.  Should you have questions after your visit or need to cancel or reschedule your appointment, please contact [facility]  Dept: [phone]  and follow the prompts.  Office hours are 8:00 a.m. to 4:30 p.m. Monday - Friday. Please note that voicemails left after 4:00 p.m. may not be returned until the following business day.  We are closed weekends and major holidays. You have access to a nurse at all times for urgent questions. Please call the main number to the clinic Dept: [phone] and follow the prompts.   For any non-urgent questions, you may also contact your provider using MyChart. We now offer e-Visits for anyone 18 and older to request care online for non-urgent symptoms. For details visit [email or web address].   Also download the MyChart app! Go to the app store, search "MyChart", open the app, select , and log in with your MyChart username and password.  Due to Covid, a mask is required upon entering the hospital/clinic. If you do not have a mask, one will be given to you upon arrival. For doctor visits, patients may have 1 support person aged 18 or older with them. For treatment visits, patients cannot have anyone with them due to current Covid guidelines and our immunocompromised population.   

## 2021-06-14 ENCOUNTER — Ambulatory Visit
Admission: RE | Admit: 2021-06-14 | Discharge: 2021-06-14 | Disposition: A | Discharge status: Home / Self Care | Payer: BC Managed Care – PPO | Source: Ambulatory Visit | Attending: Radiation Oncology | Admitting: Radiation Oncology

## 2021-06-14 DIAGNOSIS — Z510 Encounter for antineoplastic radiation therapy: Secondary | ICD-10-CM | POA: Diagnosis not present

## 2021-06-14 DIAGNOSIS — C158 Malignant neoplasm of overlapping sites of esophagus: Secondary | ICD-10-CM | POA: Diagnosis not present

## 2021-06-15 ENCOUNTER — Ambulatory Visit
Admission: RE | Admit: 2021-06-15 | Discharge: 2021-06-15 | Disposition: A | Discharge status: Home / Self Care | Payer: BC Managed Care – PPO | Source: Ambulatory Visit | Attending: Radiation Oncology | Admitting: Radiation Oncology

## 2021-06-15 ENCOUNTER — Other Ambulatory Visit: Payer: Self-pay

## 2021-06-15 DIAGNOSIS — C158 Malignant neoplasm of overlapping sites of esophagus: Secondary | ICD-10-CM | POA: Diagnosis not present

## 2021-06-15 DIAGNOSIS — Z510 Encounter for antineoplastic radiation therapy: Secondary | ICD-10-CM | POA: Diagnosis not present

## 2021-06-16 ENCOUNTER — Other Ambulatory Visit: Payer: Self-pay

## 2021-06-16 ENCOUNTER — Ambulatory Visit
Admission: RE | Admit: 2021-06-16 | Discharge: 2021-06-16 | Disposition: A | Discharge status: Home / Self Care | Payer: BC Managed Care – PPO | Source: Ambulatory Visit | Attending: Radiation Oncology | Admitting: Radiation Oncology

## 2021-06-16 DIAGNOSIS — Z510 Encounter for antineoplastic radiation therapy: Secondary | ICD-10-CM | POA: Diagnosis not present

## 2021-06-16 DIAGNOSIS — C158 Malignant neoplasm of overlapping sites of esophagus: Secondary | ICD-10-CM | POA: Diagnosis not present

## 2021-06-17 ENCOUNTER — Ambulatory Visit
Admission: RE | Admit: 2021-06-17 | Discharge: 2021-06-17 | Disposition: A | Discharge status: Home / Self Care | Payer: BC Managed Care – PPO | Source: Ambulatory Visit | Attending: Radiation Oncology | Admitting: Radiation Oncology

## 2021-06-17 DIAGNOSIS — C158 Malignant neoplasm of overlapping sites of esophagus: Secondary | ICD-10-CM | POA: Diagnosis not present

## 2021-06-17 DIAGNOSIS — Z510 Encounter for antineoplastic radiation therapy: Secondary | ICD-10-CM | POA: Diagnosis not present

## 2021-06-17 MED FILL — Dexamethasone Sodium Phosphate Inj 100 MG/10ML: INTRAMUSCULAR | Qty: 1 | Status: AC

## 2021-06-20 ENCOUNTER — Ambulatory Visit
Admission: RE | Admit: 2021-06-20 | Discharge: 2021-06-20 | Disposition: A | Discharge status: Home / Self Care | Payer: BC Managed Care – PPO | Source: Ambulatory Visit | Attending: Radiation Oncology | Admitting: Radiation Oncology

## 2021-06-20 ENCOUNTER — Other Ambulatory Visit: Payer: Self-pay | Admitting: *Deleted

## 2021-06-20 ENCOUNTER — Inpatient Hospital Stay: Payer: BC Managed Care – PPO

## 2021-06-20 ENCOUNTER — Inpatient Hospital Stay (HOSPITAL_BASED_OUTPATIENT_CLINIC_OR_DEPARTMENT_OTHER): Payer: BC Managed Care – PPO | Admitting: Hematology

## 2021-06-20 ENCOUNTER — Other Ambulatory Visit: Payer: Self-pay

## 2021-06-20 VITALS — BP 102/75 | HR 78 | Temp 98.2°F | Resp 18 | Ht 66.0 in | Wt 162.9 lb

## 2021-06-20 DIAGNOSIS — K5900 Constipation, unspecified: Secondary | ICD-10-CM | POA: Diagnosis not present

## 2021-06-20 DIAGNOSIS — Z5111 Encounter for antineoplastic chemotherapy: Secondary | ICD-10-CM | POA: Diagnosis not present

## 2021-06-20 DIAGNOSIS — C158 Malignant neoplasm of overlapping sites of esophagus: Secondary | ICD-10-CM

## 2021-06-20 DIAGNOSIS — Z79899 Other long term (current) drug therapy: Secondary | ICD-10-CM | POA: Diagnosis not present

## 2021-06-20 DIAGNOSIS — R972 Elevated prostate specific antigen [PSA]: Secondary | ICD-10-CM | POA: Diagnosis not present

## 2021-06-20 DIAGNOSIS — I10 Essential (primary) hypertension: Secondary | ICD-10-CM | POA: Diagnosis not present

## 2021-06-20 DIAGNOSIS — Z510 Encounter for antineoplastic radiation therapy: Secondary | ICD-10-CM | POA: Diagnosis not present

## 2021-06-20 DIAGNOSIS — R1310 Dysphagia, unspecified: Secondary | ICD-10-CM | POA: Diagnosis not present

## 2021-06-20 DIAGNOSIS — Z7982 Long term (current) use of aspirin: Secondary | ICD-10-CM | POA: Diagnosis not present

## 2021-06-20 DIAGNOSIS — E041 Nontoxic single thyroid nodule: Secondary | ICD-10-CM | POA: Diagnosis not present

## 2021-06-20 DIAGNOSIS — Z85820 Personal history of malignant melanoma of skin: Secondary | ICD-10-CM | POA: Diagnosis not present

## 2021-06-20 LAB — CBC WITH DIFFERENTIAL (CANCER CENTER ONLY)
Abs Immature Granulocytes: 0.04 10*3/uL (ref 0.00–0.07)
Basophils Absolute: 0 10*3/uL (ref 0.0–0.1)
Basophils Relative: 1 %
Eosinophils Absolute: 0.1 10*3/uL (ref 0.0–0.5)
Eosinophils Relative: 4 %
HCT: 34.1 % — ABNORMAL LOW (ref 39.0–52.0)
Hemoglobin: 12.3 g/dL — ABNORMAL LOW (ref 13.0–17.0)
Immature Granulocytes: 3 %
Lymphocytes Relative: 9 %
Lymphs Abs: 0.2 10*3/uL — ABNORMAL LOW (ref 0.7–4.0)
MCH: 34.2 pg — ABNORMAL HIGH (ref 26.0–34.0)
MCHC: 36.1 g/dL — ABNORMAL HIGH (ref 30.0–36.0)
MCV: 94.7 fL (ref 80.0–100.0)
Monocytes Absolute: 0.2 10*3/uL (ref 0.1–1.0)
Monocytes Relative: 10 %
Neutro Abs: 1.2 10*3/uL — ABNORMAL LOW (ref 1.7–7.7)
Neutrophils Relative %: 73 %
Platelet Count: 86 10*3/uL — ABNORMAL LOW (ref 150–400)
RBC: 3.6 MIL/uL — ABNORMAL LOW (ref 4.22–5.81)
RDW: 12.7 % (ref 11.5–15.5)
WBC Count: 1.6 10*3/uL — ABNORMAL LOW (ref 4.0–10.5)
nRBC: 0 % (ref 0.0–0.2)

## 2021-06-20 LAB — CMP (CANCER CENTER ONLY)
ALT: 28 U/L (ref 0–44)
AST: 19 U/L (ref 15–41)
Albumin: 3.4 g/dL — ABNORMAL LOW (ref 3.5–5.0)
Alkaline Phosphatase: 63 U/L (ref 38–126)
Anion gap: 8 (ref 5–15)
BUN: 16 mg/dL (ref 8–23)
CO2: 27 mmol/L (ref 22–32)
Calcium: 9.4 mg/dL (ref 8.9–10.3)
Chloride: 105 mmol/L (ref 98–111)
Creatinine: 0.79 mg/dL (ref 0.61–1.24)
GFR, Estimated: >60 mL/min (ref >60)
Glucose, Bld: 94 mg/dL (ref 70–99)
Potassium: 3.8 mmol/L (ref 3.5–5.1)
Sodium: 140 mmol/L (ref 135–145)
Total Bilirubin: 0.7 mg/dL (ref 0.3–1.2)
Total Protein: 6.6 g/dL (ref 6.5–8.1)

## 2021-06-20 MED ORDER — FLUOROURACIL CHEMO INJECTION 2.5 GM/50ML
400.0000 mg/m2 | Freq: Once | INTRAVENOUS | Status: AC
  Administered 2021-06-20: 750 mg via INTRAVENOUS
  Filled 2021-06-20: qty 15

## 2021-06-20 MED ORDER — OXALIPLATIN CHEMO INJECTION 100 MG/20ML
60.0000 mg/m2 | Freq: Once | INTRAVENOUS | Status: AC
  Administered 2021-06-20: 110 mg via INTRAVENOUS
  Filled 2021-06-20: qty 20

## 2021-06-20 MED ORDER — DEXTROSE 5 % IV SOLN: Freq: Once | INTRAVENOUS | Status: AC

## 2021-06-20 MED ORDER — HYDROCODONE-ACETAMINOPHEN 7.5-325 MG/15ML PO SOLN: 5.0000 mL | Freq: Four times a day (QID) | ORAL | 0 refills | Status: DC | PRN

## 2021-06-20 MED ORDER — PALONOSETRON HCL INJECTION 0.25 MG/5ML
0.2500 mg | Freq: Once | INTRAVENOUS | Status: AC
  Administered 2021-06-20: 0.25 mg via INTRAVENOUS
  Filled 2021-06-20: qty 5

## 2021-06-20 MED ORDER — LEUCOVORIN CALCIUM INJECTION 350 MG
400.0000 mg/m2 | Freq: Once | INTRAVENOUS | Status: AC
  Administered 2021-06-20: 748 mg via INTRAVENOUS
  Filled 2021-06-20: qty 37.4

## 2021-06-20 MED ORDER — SODIUM CHLORIDE 0.9 % IV SOLN
10.0000 mg | Freq: Once | INTRAVENOUS | Status: AC
  Administered 2021-06-20: 10 mg via INTRAVENOUS
  Filled 2021-06-20: qty 10

## 2021-06-20 NOTE — Progress Notes (Signed)
Nathaniel Jennings   [phone] [phone]) [phone]   Clinic Follow up Note   Patient Care Team: Cyndi Bender, Hershal Coria as PCP - General (Physician Assistant) [name], MD as Consulting Physician (Oncology)  Date of Service:  06/20/2021  CHIEF COMPLAINT: f/u of esophageal cancer  CURRENT THERAPY:  Concurrent chemoRT with TC, beginning 05/24/21, switched to FOLFOX 06/20/21.   ASSESSMENT & PLAN:  Nathaniel Jennings is a 65 y.o. male with   1. 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 -EGD performed by Dr. Alessandra Bevels on 04/19/21 showed a partially obstructing, likely malignant, esophageal tumor in the distal esophagus and a polyp in the mid esophagus. Biopsy of both confirmed invasive moderately differentiated adenocarcinoma of the esophagus, Her2 negative. -CT CAP on 05/05/21 showing: esophageal mass not well visualized; indeterminate mildly-enlarged high right paratracheal node near thoracic inlet; no other mediastinal adenopathy or evidence of metastatic disease in abdomen or pelvis; stable small pulmonary nodules bilaterally. -EUS 05/11/21 staged this as T3 N2 Mx. -PET scan 05/16/21 showed: 6.2 cm distal esophageal mass; hypermetabolic upper right paratracheal lymph node.  No other distant metastasis.  -EBUS biopsy on 05/26/21, path from 2R lymph node confirmed malignant cells consistent with his primary esophageal cancer. This node is not resectable by surgery  -He began concurrent chemoRT with weekly TC on 05/24/21. -We will change his chemo to FOLFOX today, at reduced dose due to mild neutropenia. We reviewed the pump procedures, that he will need either a PICC line or port placement, and reasoning behind changing treatment. -Labs reviewed, will start FOLFOX today. -toxicity check in 1 week, IVF if needed.   2.  Dysphagia with  mild weight loss -He lost about 15 lbs from April to July 2022. -He has developed burning with swallowing, secondary to radiation therapy, Carafate is not helpful. -He is supplementing with Boost. Due to the pain with swallowing, he is only drinking one a day (previously 3). He has lost 8 lbs this month.   3. Constipation -he had previously developed worsening constipation. I recommended he try over-the-counter medications first, such as Miralax or [place].    3. Hypermetabolic thyroid nodule -seen on PET 05/16/21 (and not on head/neck US on 04/18/21) -repeat head/neck US on 06/03/21 showed a 1.1 cm inferior right nodule, corresponding to hypermetabolic focus on PET.  -He is scheduled for biopsy of the nodule on 07/19/21.   4. HTN and BPH -continue medication, will monitor his blood pressure closely during treatment.     PLAN:  -PICC placement tomorrow morning for 5-fu pump infusion  -proceed with C1 FOLFOX today at reduced dose  -continue daily radiation -will plan for port placement next week  -labs and f/u in one week, will give IVF if needed -cycle 2 FOLFOX in 2 weeks    No problem-specific Assessment & Plan notes found for this encounter.   SUMMARY OF ONCOLOGIC HISTORY: Oncology History  Malignant neoplasm of overlapping sites of esophagus ([place])  04/18/2021 Imaging   ULTRASOUND OF HEAD/NECK SOFT TISSUES  IMPRESSION: No suspicious lymphadenopathy   04/19/2021 Procedure   EGD  Impression: - Esophageal polyp(s) were found. Biopsied. - Partially obstructing, likely malignant esophageal tumor was found in the distal esophagus. - Small hiatal hernia. - A single gastric polyp. Biopsied. - Normal duodenal bulb, first portion of the duodenum and second portion of the duodenum.   04/19/2021 Pathology  Results   FINAL MICROSCOPIC DIAGNOSIS: Stomach, Biopsy - FUNDIC GLAND POLYP. Negative for dysplasia. NO Helicobacter pylori organisms seen on H and E stain. Esophagus- Distal,  Biopsy - INVASIVE MODERATELY DIFFERENTIATED ADENOCARCINOMA OF THE ESOPHAGUS.  - HER2 Study by Immunostain: Negative (score 1+) Esophagus- Mid, Biopsy - INVASIVE MODERATELY DIFFERENTIATED ADENOCARCINOMA OF THE ESOPHAGUS. - HER2 Study by Immunostain: Negative (score 1+)   05/05/2021 Imaging   CT CAP  IMPRESSION: 1. The patient's known esophageal mass is not well visualized. There is mild irregular wall thickening of the distal esophagus. Correlate with previous clinical evaluation. 2. Indeterminate mildly enlarged high right paratracheal node near the thoracic inlet. No other mediastinal adenopathy. 3. No evidence of metastatic disease in the abdomen or pelvis. 4. Stable small pulmonary nodules bilaterally consistent with benign findings. 5. Cholelithiasis, small renal cysts and Aortic Atherosclerosis (ICD10-I70.0).   05/09/2021 Initial Diagnosis   Malignant neoplasm of overlapping sites of esophagus ([place])   05/11/2021 Procedure   Upper EUS  Impression: - Mucosal nodule found in the esophagus. - Partially obstructing, malignant esophageal tumor was found at the gastroesophageal junction. - At least 3 discrete peritumoral lymph nodes - A mass was found in the gastroesophageal junction. A tissue diagnosis was obtained prior to this exam. This is of adenocarcinoma. This was staged T3 N2 Mx by endosonographic criteria. - Unable to traverse GE junction with either EGD scope or radial EUS scope.   05/13/2021 Cancer Staging   Staging form: Esophagus - Adenocarcinoma, AJCC 8th Edition - Clinical stage from 05/13/2021: Stage IVA (cT3, cN2, cM0) - Signed by [name], MD on 05/22/2021   05/16/2021 PET scan   IMPRESSION: 1. Distal esophageal mass measuring about 6.2 cm in length, maximum SUV 13.1. 2. Mildly hypermetabolic and mildly enlarged upper right paratracheal lymph node, 1.3 cm in short axis with maximum SUV 3.6, concerning for malignant involvement. 3. Hypodense 1.1 cm right thyroid  nodule has a maximum SUV substantially above that of the background thyroid. A significant minority of such nodules can harbor thyroid cancer. Recommend thyroid US and biopsy (ref: J Am Coll Radiol. 2015 Feb;12(2): 143-50). 4. 3 by 4 mm right lower lobe pulmonary nodule is similar to 05/05/2021, not hypermetabolic but below sensitive PET-CT size thresholds. Surveillance recommended. 5. Other imaging findings of potential clinical significance: Chronic left maxillary sinusitis. Aortic Atherosclerosis (ICD10-I70.0). Cholelithiasis. Prostatomegaly.   05/24/2021 - 06/13/2021 Chemotherapy          05/26/2021 Pathology Results   FINAL MICROSCOPIC DIAGNOSIS:   A. LYMPH NODE, 2R, FINE NEEDLE ASPIRATION:  - Malignant cells consistent with non-small cell carcinoma   COMMENT:  The cells are positive for cytokeratin 20 and CDX-2 (patchy). TTF-1, NapsinA, cytokeratin 5/6, p40, S100, MelanA, and cytokeratin 7 are negative. The patient's history of esophageal adenocarcinoma is noted and CDX-2 positivity is consistent with a gastrointestinal primary. This cytokeratin7/20 profile is usually seen in lower tract tumors, but this pattern can be seen in gastric/esophageal cases.   06/20/2021 -  Chemotherapy    Patient is on Treatment Plan: GASTROESOPHAGEAL FOLFOX Q14D X 6 CYCLES          INTERVAL HISTORY:  Nathaniel Jennings is here for a follow up of esophageal cancer. He was last seen by me on 06/13/21. He presents to the clinic accompanied by his wife. He reports continued trouble with and painful swallowing. He reports continued pain to his neck from his thyroid ultrasound. He rates the pain at 7-8/10. His wife reports the pain will occur  randomly, and he will belch as if air is trapped. He notes he continues to work in [employer]; he likes to stay busy.    All other systems were reviewed with the patient and are negative.  MEDICAL HISTORY:  Past Medical History:  Diagnosis Date   BPH  (benign prostatic hyperplasia)    Dyslipidemia    Elevated PSA    Esophageal cancer ([facility])    Full dentures    GERD (gastroesophageal reflux disease)    History of melanoma excision    07/ 2011  s/p  wide excision left back--- per pathology-- negative maligant --  mild melanocytic atypia   History of palpitations    cardiologist --  dr Shelva Majestic (last note in epic 2014   Hypertension    Sleep apnea     SURGICAL HISTORY: Past Surgical History:  Procedure Laterality Date   COLONOSCOPY  10/24/2007   ESOPHAGOGASTRODUODENOSCOPY (EGD) WITH PROPOFOL N/A 05/11/2021   Procedure: ESOPHAGOGASTRODUODENOSCOPY (EGD) WITH PROPOFOL;  Surgeon: Arta Silence, MD;  Location: [facility];  Service: Endoscopy;  Laterality: N/A;   EUS N/A 05/11/2021   Procedure: ESOPHAGEAL ENDOSCOPIC ULTRASOUND (EUS) RADIAL;  Surgeon: Arta Silence, MD;  Location: [facility];  Service: Endoscopy;  Laterality: N/A;   EYE SURGERY     Lasik surgery on both eyes, but still needs glasses   FINE NEEDLE ASPIRATION  05/26/2021   Procedure: FINE NEEDLE ASPIRATION (FNA) LINEAR;  Surgeon: Garner Nash, DO;  Location: [place];  Service: Pulmonary;;   PROSTATE BIOPSY N/A 07/02/2015   Procedure: BIOPSY TRANSRECTAL ULTRASONIC PROSTATE (TUBP);  Surgeon: Lowella Bandy, MD;  Location[facility];  Service: Urology;  Laterality: N/A;   PROSTATE BIOPSY N/A 11/22/2015   Procedure: BIOPSY TRANSRECTAL ULTRASONIC PROSTATE (TUBP);  Surgeon: Cleon Gustin, MD;  Location: [facility];  Service: Urology;  Laterality: N/A;   TRANSTHORACIC ECHOCARDIOGRAM  11/19/2012   normal echo/  trivial TR   VIDEO BRONCHOSCOPY WITH ENDOBRONCHIAL ULTRASOUND N/A 05/26/2021   Procedure: VIDEO BRONCHOSCOPY WITH ENDOBRONCHIAL ULTRASOUND;  Surgeon: Garner Nash, DO;  Location: [place];  Service: Pulmonary;  Laterality: N/A;   WIDE EXCISION MELANOMA LEFT BACK  05/02/2010    I have reviewed the social history and  family history with the patient and they are unchanged from previous note.  ALLERGIES:  is allergic to bee venom.  MEDICATIONS:  Current Outpatient Medications  Medication Sig Dispense Refill   HYDROcodone-acetaminophen (HYCET) 7.5-325 mg/15 ml solution Take 5-10 mLs by mouth every 6 (six) hours as needed for moderate pain. 120 mL 0   aspirin EC 81 MG tablet Take 81 mg by mouth in the morning.     lidocaine (XYLOCAINE) 2 % solution Patient: Mix 1part 2% viscous lidocaine, 1part H20. Swallow 90m of diluted mixture, 329m before meals and at bedtime, up to QID 200 mL 3   metoprolol succinate (TOPROL-XL) 100 MG 24 hr tablet Take 100 mg by mouth in the morning.     pantoprazole (PROTONIX) 40 MG tablet Take 1 tablet (40 mg total) by mouth 2 (two) times daily. 30 tablet 1   sucralfate (CARAFATE) 1 g tablet Dissolve 1 tablet in 10 mL H20 and swallow up to QID prn soreness. 40 tablet 5   No current facility-administered medications for this visit.    PHYSICAL EXAMINATION: ECOG PERFORMANCE STATUS: 1 - Symptomatic but completely ambulatory  Vitals:   06/20/21 1202  BP: 102/75  Pulse: 78  Resp: 18  Temp: 98.2 F (  36.8 C)  SpO2: 99%   Wt Readings from Last 3 Encounters:  06/20/21 162 lb 14.4 oz (73.9 kg)  06/13/21 164 lb 11.2 oz (74.7 kg)  06/06/21 167 lb 9.6 oz (76 kg)     GENERAL:alert, no distress and comfortable SKIN: skin color normal, no rashes or significant lesions EYES: normal, Conjunctiva are pink and non-injected, sclera clear  NEURO: alert & oriented x 3 with fluent speech  LABORATORY DATA:  I have reviewed the data as listed CBC Latest Ref Rng & Units 06/20/2021 06/13/2021 06/06/2021  WBC 4.0 - 10.5 K/uL 1.6(L) 6.5 3.2(L)  Hemoglobin 13.0 - 17.0 g/dL 12.3(L) 13.6 13.5  Hematocrit 39.0 - 52.0 % 34.1(L) 37.5(L) 36.7(L)  Platelets 150 - 400 K/uL 86(L) 100(L) 118(L)     CMP Latest Ref Rng & Units 06/20/2021 06/13/2021 06/06/2021  Glucose 70 - 99 mg/dL 94 146(H) 97  BUN 8 -  23 mg/dL _0 Creatinine 0.61 - 1.24 mg/dL 0.79 0.87 0.83  Sodium 135 - 145 mmol/L 140 135 140  Potassium 3.5 - 5.1 mmol/L 3.8 4.0 4.2  Chloride 98 - 111 mmol/L 105 100 105  CO2 22 - 32 mmol/L _1 Calcium 8.9 - 10.3 mg/dL 9.4 9.3 9.4  Total Protein 6.5 - 8.1 g/dL 6.6 7.0 6.5  Total Bilirubin 0.3 - 1.2 mg/dL 0.7 1.4(H) 0.6  Alkaline Phos 38 - 126 U/L 63 64 61  AST 15 - 41 U/L 19 12(L) 14(L)  ALT 0 - 44 U/L _2 RADIOGRAPHIC STUDIES: I have personally reviewed the radiological images as listed and agreed with the findings in the report. No results found.    Orders Placed This Encounter  Procedures   IR IMAGING GUIDED PORT INSERTION    Standing Status:   Future    Standing Expiration Date:   06/20/2022    Order Specific Question:   Reason for Exam (SYMPTOM  OR DIAGNOSIS REQUIRED)    Answer:   chemo    Order Specific Question:   Preferred Imaging Location?    Answer:   [facility]    All questions were answered. The patient knows to call the clinic with any problems, questions or concerns. No barriers to learning was detected. The total time spent in the appointment was 30 minutes.     [name], MD 06/20/2021   I, Wilburn Mylar, am acting as scribe for [name], MD.   I have reviewed the above documentation for accuracy and completeness, and I agree with the above.

## 2021-06-20 NOTE — Patient Instructions (Signed)
[facility]   Discharge Instructions: Thank you for choosing [facility] to provide your oncology and hematology care.   If you have a lab appointment with the [facility], please go directly to the [facility] and check in at the registration area.   Wear comfortable clothing and clothing appropriate for easy access to any Portacath or PICC line.   We strive to give you quality time with your provider. You may need to reschedule your appointment if you arrive late (15 or more minutes).  Arriving late affects you and other patients whose appointments are after yours.  Also, if you miss three or more appointments without notifying the office, you may be dismissed from the clinic at the provider's discretion.      For prescription refill requests, have your pharmacy contact our office and allow 72 hours for refills to be completed.    Today you received the following chemotherapy and/or immunotherapy agents: oxaliplatin, leucovorin, and fluorouracil.      To help prevent nausea and vomiting after your treatment, we encourage you to take your nausea medication as directed.  BELOW ARE SYMPTOMS THAT SHOULD BE REPORTED IMMEDIATELY: *FEVER GREATER THAN 100.4 F (38 C) OR HIGHER *CHILLS OR SWEATING *NAUSEA AND VOMITING THAT IS NOT CONTROLLED WITH YOUR NAUSEA MEDICATION *UNUSUAL SHORTNESS OF BREATH *UNUSUAL BRUISING OR BLEEDING *URINARY PROBLEMS (pain or burning when urinating, or frequent urination) *BOWEL PROBLEMS (unusual diarrhea, constipation, pain near the anus) TENDERNESS IN MOUTH AND THROAT WITH OR WITHOUT PRESENCE OF ULCERS (sore throat, sores in mouth, or a toothache) UNUSUAL RASH, SWELLING OR PAIN  UNUSUAL VAGINAL DISCHARGE OR ITCHING   Items with * indicate a potential emergency and should be followed up as soon as possible or go to the Emergency Department if any problems should occur.  Please show the CHEMOTHERAPY ALERT CARD or  IMMUNOTHERAPY ALERT CARD at check-in to the Emergency Department and triage nurse.  Should you have questions after your visit or need to cancel or reschedule your appointment, please contact [facility]  Dept: [phone]  and follow the prompts.  Office hours are 8:00 a.m. to 4:30 p.m. Monday - Friday. Please note that voicemails left after 4:00 p.m. may not be returned until the following business day.  We are closed weekends and major holidays. You have access to a nurse at all times for urgent questions. Please call the main number to the clinic Dept: [phone] and follow the prompts.   For any non-urgent questions, you may also contact your provider using MyChart. We now offer e-Visits for anyone 9 and older to request care online for non-urgent symptoms. For details visit [email or web address].   Also download the MyChart app! Go to the app store, search "MyChart", open the app, select [facility], and log in with your MyChart username and password.  Due to Covid, a mask is required upon entering the hospital/clinic. If you do not have a mask, one will be given to you upon arrival. For doctor visits, patients may have 1 support person aged 55 or older with them. For treatment visits, patients cannot have anyone with them due to current Covid guidelines and our immunocompromised population.   Oxaliplatin Injection What is this medication? OXALIPLATIN (ox AL i PLA tin) is a chemotherapy drug. It targets fast dividing cells, like cancer cells, and causes these cells to die. This medicine is usedto treat cancers of the colon and rectum, and many other cancers.  This medicine may be used for other purposes; ask your health care provider orpharmacist if you have questions. COMMON BRAND NAME(S): Eloxatin What should I tell my care team before I take this medication? They need to know if you have any of these conditions: heart disease history of irregular  heartbeat liver disease low blood counts, like white cells, platelets, or red blood cells lung or breathing disease, like asthma take medicines that treat or prevent blood clots tingling of the fingers or toes, or other nerve disorder an unusual or allergic reaction to oxaliplatin, other chemotherapy, other medicines, foods, dyes, or preservatives pregnant or trying to get pregnant breast-feeding How should I use this medication? This drug is given as an infusion into a vein. It is administered in a hospitalor clinic by a specially trained health care professional. Talk to your pediatrician regarding the use of this medicine in children.Special care may be needed. Overdosage: If you think you have taken too much of this medicine contact apoison control center or emergency room at once. NOTE: This medicine is only for you. Do not share this medicine with others. What if I miss a dose? It is important not to miss a dose. Call your doctor or health careprofessional if you are unable to keep an appointment. What may interact with this medication? Do not take this medicine with any of the following medications: cisapride dronedarone pimozide thioridazine This medicine may also interact with the following medications: aspirin and aspirin-like medicines certain medicines that treat or prevent blood clots like warfarin, apixaban, dabigatran, and rivaroxaban cisplatin cyclosporine diuretics medicines for infection like acyclovir, adefovir, amphotericin B, bacitracin, cidofovir, foscarnet, ganciclovir, gentamicin, pentamidine, vancomycin NSAIDs, medicines for pain and inflammation, like ibuprofen or naproxen other medicines that prolong the QT interval (an abnormal heart rhythm) pamidronate zoledronic acid This list may not describe all possible interactions. Give your health care provider a list of all the medicines, herbs, non-prescription drugs, or dietary supplements you use. Also tell  them if you smoke, drink alcohol, or use illegaldrugs. Some items may interact with your medicine. What should I watch for while using this medication? Your condition will be monitored carefully while you are receiving thismedicine. You may need blood work done while you are taking this medicine. This medicine may make you feel generally unwell. This is not uncommon as chemotherapy can affect healthy cells as well as cancer cells. Report any side effects. Continue your course of treatment even though you feel ill unless yourhealthcare professional tells you to stop. This medicine can make you more sensitive to cold. Do not drink cold drinks or use ice. Cover exposed skin before coming in contact with cold temperatures or cold objects. When out in cold weather wear warm clothing and cover your mouth and nose to warm the air that goes into your lungs. Tell your doctor if you getsensitive to the cold. Do not become pregnant while taking this medicine or for 9 months after stopping it. Women should inform their health care professional if they wish to become pregnant or think they might be pregnant. Men should not father a child while taking this medicine and for 6 months after stopping it. There is potential for serious side effects to an unborn child. Talk to your health careprofessional for more information. Do not breast-feed a child while taking this medicine or for 3 months afterstopping it. This medicine has caused ovarian failure in some women. This medicine may make it more difficult to get pregnant. Talk to  your health care professional if youare concerned about your fertility. This medicine has caused decreased sperm counts in some men. This may make it more difficult to father a child. Talk to your health care professional if [place] concerned about your fertility. This medicine may increase your risk of getting an infection. Call your health care professional for advice if you get a fever, chills,  or sore throat, or other symptoms of a cold or flu. Do not treat yourself. Try to avoid beingaround people who are sick. Avoid taking medicines that contain aspirin, acetaminophen, ibuprofen, naproxen, or ketoprofen unless instructed by your health care professional.These medicines may hide a fever. Be careful brushing or flossing your teeth or using a toothpick because you may get an infection or bleed more easily. If you have any dental work done, Primary school teacher you are receiving this medicine. What side effects may I notice from receiving this medication? Side effects that you should report to your doctor or health care professionalas soon as possible: allergic reactions like skin rash, itching or hives, swelling of the face, lips, or tongue breathing problems cough low blood counts - this medicine may decrease the number of white blood cells, red blood cells, and platelets. You may be at increased risk for infections and bleeding nausea, vomiting pain, redness, or irritation at site where injected pain, tingling, numbness in the hands or feet signs and symptoms of bleeding such as bloody or black, tarry stools; red or dark brown urine; spitting up blood or brown material that looks like coffee grounds; red spots on the skin; unusual bruising or bleeding from the eyes, gums, or nose signs and symptoms of a dangerous change in heartbeat or heart rhythm like chest pain; dizziness; fast, irregular heartbeat; palpitations; feeling faint or lightheaded; falls signs and symptoms of infection like fever; chills; cough; sore throat; pain or trouble passing urine signs and symptoms of liver injury like dark yellow or brown urine; general ill feeling or flu-like symptoms; light-colored stools; loss of appetite; nausea; right upper belly pain; unusually weak or tired; yellowing of the eyes or skin signs and symptoms of low red blood cells or anemia such as unusually weak or tired; feeling faint or  lightheaded; falls signs and symptoms of muscle injury like dark urine; trouble passing urine or change in the amount of urine; unusually weak or tired; muscle pain; back pain Side effects that usually do not require medical attention (report to yourdoctor or health care professional if they continue or are bothersome): changes in taste diarrhea gas hair loss loss of appetite mouth sores This list may not describe all possible side effects. Call your doctor for medical advice about side effects. You may report side effects to FDA at1-800-FDA-1088. Where should I keep my medication? This drug is given in a hospital or clinic and will not be stored at home. NOTE: This sheet is a summary. It may not cover all possible information. If you have questions about this medicine, talk to your doctor, pharmacist, orhealth care provider.  2022 Elsevier/Gold Standard (2019-02-26 12:20:35)  Leucovorin injection What is this medication? LEUCOVORIN (loo koe VOR in) is used to prevent or treat the harmful effects of some medicines. This medicine is used to treat anemia caused by a low amount of folic acid in the body. It is also used with 5-fluorouracil (5-FU) to treatcolon cancer. This medicine may be used for other purposes; ask your health care provider orpharmacist if you have questions. What should I tell my  care team before I take this medication? They need to know if you have any of these conditions: anemia from low levels of vitamin B-12 in the blood an unusual or allergic reaction to leucovorin, folic acid, other medicines, foods, dyes, or preservatives pregnant or trying to get pregnant breast-feeding How should I use this medication? This medicine is for injection into a muscle or into a vein. It is given by ahealth care professional in a hospital or clinic setting. Talk to your pediatrician regarding the use of this medicine in children.Special care may be needed. Overdosage: If you think you  have taken too much of this medicine contact apoison control center or emergency room at once. NOTE: This medicine is only for you. Do not share this medicine with others. What if I miss a dose? This does not apply. What may interact with this medication? capecitabine fluorouracil phenobarbital phenytoin primidone trimethoprim-sulfamethoxazole This list may not describe all possible interactions. Give your health care provider a list of all the medicines, herbs, non-prescription drugs, or dietary supplements you use. Also tell them if you smoke, drink alcohol, or use illegaldrugs. Some items may interact with your medicine. What should I watch for while using this medication? Your condition will be monitored carefully while you are receiving thismedicine. This medicine may increase the side effects of 5-fluorouracil, 5-FU. Tell your doctor or health care professional if you have diarrhea or mouth sores that donot get better or that get worse. What side effects may I notice from receiving this medication? Side effects that you should report to your doctor or health care professionalas soon as possible: allergic reactions like skin rash, itching or hives, swelling of the face, lips, or tongue breathing problems fever, infection mouth sores unusual bleeding or bruising unusually weak or tired Side effects that usually do not require medical attention (report to yourdoctor or health care professional if they continue or are bothersome): constipation or diarrhea loss of appetite nausea, vomiting This list may not describe all possible side effects. Call your doctor for medical advice about side effects. You may report side effects to FDA at1-800-FDA-1088. Where should I keep my medication? This drug is given in a hospital or clinic and will not be stored at home. NOTE: This sheet is a summary. It may not cover all possible information. If you have questions about this medicine, talk to your  doctor, pharmacist, orhealth care provider.  2022 Elsevier/Gold Standard (2008-04-14 16:50:29)  Fluorouracil, 5-FU injection What is this medication? FLUOROURACIL, 5-FU (flure oh YOOR a sil) is a chemotherapy drug. It slows the growth of cancer cells. This medicine is used to treat many types of cancer like breast cancer, colon or rectal cancer, pancreatic cancer, and stomachcancer. This medicine may be used for other purposes; ask your health care provider orpharmacist if you have questions. COMMON BRAND NAME(S): Adrucil What should I tell my care team before I take this medication? They need to know if you have any of these conditions: blood disorders dihydropyrimidine dehydrogenase (DPD) deficiency infection (especially a virus infection such as chickenpox, cold sores, or herpes) kidney disease liver disease malnourished, poor nutrition recent or ongoing radiation therapy an unusual or allergic reaction to fluorouracil, other chemotherapy, other medicines, foods, dyes, or preservatives pregnant or trying to get pregnant breast-feeding How should I use this medication? This drug is given as an infusion or injection into a vein. It is administeredin a hospital or clinic by a specially trained health care professional. Talk to your pediatrician  regarding the use of this medicine in children.Special care may be needed. Overdosage: If you think you have taken too much of this medicine contact apoison control center or emergency room at once. NOTE: This medicine is only for you. Do not share this medicine with others. What if I miss a dose? It is important not to miss your dose. Call your doctor or health careprofessional if you are unable to keep an appointment. What may interact with this medication? Do not take this medicine with any of the following medications: live virus vaccines This medicine may also interact with the following medications: medicines that treat or prevent blood  clots like warfarin, enoxaparin, and dalteparin This list may not describe all possible interactions. Give your health care provider a list of all the medicines, herbs, non-prescription drugs, or dietary supplements you use. Also tell them if you smoke, drink alcohol, or use illegaldrugs. Some items may interact with your medicine. What should I watch for while using this medication? Visit your doctor for checks on your progress. This drug may make you feel generally unwell. This is not uncommon, as chemotherapy can affect healthy cells as well as cancer cells. Report any side effects. Continue your course oftreatment even though you feel ill unless your doctor tells you to stop. In some cases, you may be given additional medicines to help with side effects.Follow all directions for their use. Call your doctor or health care professional for advice if you get a fever, chills or sore throat, or other symptoms of a cold or flu. Do not treat yourself. This drug decreases your body's ability to fight infections. Try toavoid being around people who are sick. This medicine may increase your risk to bruise or bleed. Call your doctor orhealth care professional if you notice any unusual bleeding. Be careful brushing and flossing your teeth or using a toothpick because you may get an infection or bleed more easily. If you have any dental work done,tell your dentist you are receiving this medicine. Avoid taking products that contain aspirin, acetaminophen, ibuprofen, naproxen, or ketoprofen unless instructed by your doctor. These medicines may hide afever. Do not become pregnant while taking this medicine. Women should inform their doctor if they wish to become pregnant or think they might be pregnant. There is a potential for serious side effects to an unborn child. Talk to your health care professional or pharmacist for more information. Do not breast-feed aninfant while taking this medicine. Men should inform  their doctor if they wish to father a child. This medicinemay lower sperm counts. Do not treat diarrhea with over the counter products. Contact your doctor ifyou have diarrhea that lasts more than 2 days or if it is severe and watery. This medicine can make you more sensitive to the sun. Keep out of the sun. If you cannot avoid being in the sun, wear protective clothing and use sunscreen.Do not use sun lamps or tanning beds/booths. What side effects may I notice from receiving this medication? Side effects that you should report to your doctor or health care professionalas soon as possible: allergic reactions like skin rash, itching or hives, swelling of the face, lips, or tongue low blood counts - this medicine may decrease the number of white blood cells, red blood cells and platelets. You may be at increased risk for infections and bleeding. signs of infection - fever or chills, cough, sore throat, pain or difficulty passing urine signs of decreased platelets or bleeding - bruising, pinpoint red spots  on the skin, black, tarry stools, blood in the urine signs of decreased red blood cells - unusually weak or tired, fainting spells, lightheadedness breathing problems changes in vision chest pain mouth sores nausea and vomiting pain, swelling, redness at site where injected pain, tingling, numbness in the hands or feet redness, swelling, or sores on hands or feet stomach pain unusual bleeding Side effects that usually do not require medical attention (report to yourdoctor or health care professional if they continue or are bothersome): changes in finger or toe nails diarrhea dry or itchy skin hair loss headache loss of appetite sensitivity of eyes to the light stomach upset unusually teary eyes This list may not describe all possible side effects. Call your doctor for medical advice about side effects. You may report side effects to FDA at1-800-FDA-1088. Where should I keep my  medication? This drug is given in a hospital or clinic and will not be stored at home. NOTE: This sheet is a summary. It may not cover all possible information. If you have questions about this medicine, talk to your doctor, pharmacist, orhealth care provider.  2022 Elsevier/Gold Standard (2019-09-09 15:00:03)

## 2021-06-20 NOTE — Progress Notes (Signed)
Per Dr. Burr Medico, ok to treat today with ANC 1.2 and platelets 86.

## 2021-06-21 ENCOUNTER — Inpatient Hospital Stay: Payer: BC Managed Care – PPO

## 2021-06-21 ENCOUNTER — Ambulatory Visit (HOSPITAL_COMMUNITY)
Admission: RE | Admit: 2021-06-21 | Discharge: 2021-06-21 | Disposition: A | Discharge status: Home / Self Care | Payer: BC Managed Care – PPO | Source: Ambulatory Visit | Attending: Hematology | Admitting: Hematology

## 2021-06-21 ENCOUNTER — Ambulatory Visit
Admission: RE | Admit: 2021-06-21 | Discharge: 2021-06-21 | Disposition: A | Discharge status: Home / Self Care | Payer: BC Managed Care – PPO | Source: Ambulatory Visit | Attending: Radiation Oncology | Admitting: Radiation Oncology

## 2021-06-21 ENCOUNTER — Telehealth: Payer: Self-pay | Admitting: Hematology

## 2021-06-21 ENCOUNTER — Other Ambulatory Visit: Payer: Self-pay | Admitting: Hematology

## 2021-06-21 VITALS — BP 121/73 | HR 75 | Temp 98.3°F | Resp 18 | Ht 66.0 in | Wt 161.2 lb

## 2021-06-21 DIAGNOSIS — I10 Essential (primary) hypertension: Secondary | ICD-10-CM | POA: Diagnosis not present

## 2021-06-21 DIAGNOSIS — R1310 Dysphagia, unspecified: Secondary | ICD-10-CM | POA: Diagnosis not present

## 2021-06-21 DIAGNOSIS — C158 Malignant neoplasm of overlapping sites of esophagus: Secondary | ICD-10-CM

## 2021-06-21 DIAGNOSIS — E041 Nontoxic single thyroid nodule: Secondary | ICD-10-CM | POA: Diagnosis not present

## 2021-06-21 DIAGNOSIS — Z510 Encounter for antineoplastic radiation therapy: Secondary | ICD-10-CM | POA: Diagnosis not present

## 2021-06-21 DIAGNOSIS — Z85820 Personal history of malignant melanoma of skin: Secondary | ICD-10-CM | POA: Diagnosis not present

## 2021-06-21 DIAGNOSIS — K5900 Constipation, unspecified: Secondary | ICD-10-CM | POA: Diagnosis not present

## 2021-06-21 DIAGNOSIS — Z7982 Long term (current) use of aspirin: Secondary | ICD-10-CM | POA: Diagnosis not present

## 2021-06-21 DIAGNOSIS — Z5111 Encounter for antineoplastic chemotherapy: Secondary | ICD-10-CM | POA: Diagnosis not present

## 2021-06-21 DIAGNOSIS — Z79899 Other long term (current) drug therapy: Secondary | ICD-10-CM | POA: Diagnosis not present

## 2021-06-21 DIAGNOSIS — Z452 Encounter for adjustment and management of vascular access device: Secondary | ICD-10-CM | POA: Diagnosis not present

## 2021-06-21 DIAGNOSIS — R972 Elevated prostate specific antigen [PSA]: Secondary | ICD-10-CM | POA: Diagnosis not present

## 2021-06-21 MED ORDER — LIDOCAINE HCL 1 % IJ SOLN
INTRAMUSCULAR | Status: AC
  Administered 2021-06-21: 1 mL via SUBCUTANEOUS
  Filled 2021-06-21: qty 20

## 2021-06-21 MED ORDER — HEPARIN SOD (PORK) LOCK FLUSH 100 UNIT/ML IV SOLN
INTRAVENOUS | Status: AC
  Administered 2021-06-21: 5 [IU]
  Filled 2021-06-21: qty 5

## 2021-06-21 MED ORDER — SODIUM CHLORIDE 0.9 % IV SOLN
1600.0000 mg/m2 | INTRAVENOUS | Status: DC
  Administered 2021-06-21: 3000 mg via INTRAVENOUS
  Filled 2021-06-21: qty 60

## 2021-06-21 NOTE — Patient Instructions (Signed)
[facility]  Discharge Instructions: Thank you for choosing [facility] to provide your oncology and hematology care.   If you have a lab appointment with the [facility], please go directly to the [facility] and check in at the registration area.   Wear comfortable clothing and clothing appropriate for easy access to any Portacath or PICC line.   We strive to give you quality time with your provider. You may need to reschedule your appointment if you arrive late (15 or more minutes).  Arriving late affects you and other patients whose appointments are after yours.  Also, if you miss three or more appointments without notifying the office, you may be dismissed from the clinic at the provider's discretion.      For prescription refill requests, have your pharmacy contact our office and allow 72 hours for refills to be completed.    Today you received the following chemotherapy and/or immunotherapy agents Flurouracil      To help prevent nausea and vomiting after your treatment, we encourage you to take your nausea medication as directed.  BELOW ARE SYMPTOMS THAT SHOULD BE REPORTED IMMEDIATELY: *FEVER GREATER THAN 100.4 F (38 C) OR HIGHER *CHILLS OR SWEATING *NAUSEA AND VOMITING THAT IS NOT CONTROLLED WITH YOUR NAUSEA MEDICATION *UNUSUAL SHORTNESS OF BREATH *UNUSUAL BRUISING OR BLEEDING *URINARY PROBLEMS (pain or burning when urinating, or frequent urination) *BOWEL PROBLEMS (unusual diarrhea, constipation, pain near the anus) TENDERNESS IN MOUTH AND THROAT WITH OR WITHOUT PRESENCE OF ULCERS (sore throat, sores in mouth, or a toothache) UNUSUAL RASH, SWELLING OR PAIN  UNUSUAL VAGINAL DISCHARGE OR ITCHING   Items with * indicate a potential emergency and should be followed up as soon as possible or go to the Emergency Department if any problems should occur.  Please show the CHEMOTHERAPY ALERT CARD or IMMUNOTHERAPY ALERT CARD at check-in to  the Emergency Department and triage nurse.  Should you have questions after your visit or need to cancel or reschedule your appointment, please contact [facility]  Dept: [phone]  and follow the prompts.  Office hours are 8:00 a.m. to 4:30 p.m. Monday - Friday. Please note that voicemails left after 4:00 p.m. may not be returned until the following business day.  We are closed weekends and major holidays. You have access to a nurse at all times for urgent questions. Please call the main number to the clinic Dept: [phone] and follow the prompts.   For any non-urgent questions, you may also contact your provider using MyChart. We now offer e-Visits for anyone 65 and older to request care online for non-urgent symptoms. For details visit [email or web address].   Also download the MyChart app! Go to the app store, search "MyChart", open the app, select [facility], and log in with your MyChart username and password.  Due to Covid, a mask is required upon entering the hospital/clinic. If you do not have a mask, one will be given to you upon arrival. For doctor visits, patients may have 1 support person aged 65 or older with them. For treatment visits, patients cannot have anyone with them due to current Covid guidelines and our immunocompromised population.

## 2021-06-21 NOTE — Telephone Encounter (Signed)
Left message with follow-up appointments per 8/29 los. 

## 2021-06-21 NOTE — Procedures (Signed)
PROCEDURE SUMMARY:  Successful placement of double lumen PICC line to right brachial vein. Length 34 cm Tip at lower SVC/RA PICC capped No complications Ready for use  EBL < 1 mL   Nathaniel Jennings Nathaniel Worthy Boschert PA-C 06/21/2021, 8:54 AM

## 2021-06-22 ENCOUNTER — Ambulatory Visit
Admission: RE | Admit: 2021-06-22 | Discharge: 2021-06-22 | Disposition: A | Discharge status: Home / Self Care | Payer: BC Managed Care – PPO | Source: Ambulatory Visit | Attending: Radiation Oncology | Admitting: Radiation Oncology

## 2021-06-22 ENCOUNTER — Other Ambulatory Visit: Payer: Self-pay

## 2021-06-22 DIAGNOSIS — Z510 Encounter for antineoplastic radiation therapy: Secondary | ICD-10-CM | POA: Diagnosis not present

## 2021-06-22 DIAGNOSIS — C158 Malignant neoplasm of overlapping sites of esophagus: Secondary | ICD-10-CM | POA: Diagnosis not present

## 2021-06-23 ENCOUNTER — Ambulatory Visit
Admission: RE | Admit: 2021-06-23 | Discharge: 2021-06-23 | Disposition: A | Discharge status: Home / Self Care | Payer: BC Managed Care – PPO | Source: Ambulatory Visit | Attending: Radiation Oncology | Admitting: Radiation Oncology

## 2021-06-23 ENCOUNTER — Inpatient Hospital Stay: Payer: BC Managed Care – PPO | Attending: Hematology

## 2021-06-23 VITALS — BP 106/75 | HR 80 | Temp 98.6°F | Resp 18

## 2021-06-23 DIAGNOSIS — C158 Malignant neoplasm of overlapping sites of esophagus: Secondary | ICD-10-CM | POA: Diagnosis not present

## 2021-06-23 DIAGNOSIS — Z79899 Other long term (current) drug therapy: Secondary | ICD-10-CM | POA: Diagnosis not present

## 2021-06-23 DIAGNOSIS — Z510 Encounter for antineoplastic radiation therapy: Secondary | ICD-10-CM | POA: Diagnosis not present

## 2021-06-23 DIAGNOSIS — R1319 Other dysphagia: Secondary | ICD-10-CM | POA: Diagnosis not present

## 2021-06-23 DIAGNOSIS — K5900 Constipation, unspecified: Secondary | ICD-10-CM | POA: Diagnosis not present

## 2021-06-23 DIAGNOSIS — Z5111 Encounter for antineoplastic chemotherapy: Secondary | ICD-10-CM | POA: Diagnosis not present

## 2021-06-23 DIAGNOSIS — R634 Abnormal weight loss: Secondary | ICD-10-CM | POA: Insufficient documentation

## 2021-06-23 DIAGNOSIS — E041 Nontoxic single thyroid nodule: Secondary | ICD-10-CM | POA: Diagnosis not present

## 2021-06-23 DIAGNOSIS — I10 Essential (primary) hypertension: Secondary | ICD-10-CM | POA: Insufficient documentation

## 2021-06-23 DIAGNOSIS — N400 Enlarged prostate without lower urinary tract symptoms: Secondary | ICD-10-CM | POA: Insufficient documentation

## 2021-06-23 MED ORDER — HEPARIN SOD (PORK) LOCK FLUSH 100 UNIT/ML IV SOLN
500.0000 [IU] | Freq: Once | INTRAVENOUS | Status: AC | PRN
  Administered 2021-06-23: 500 [IU]

## 2021-06-23 MED ORDER — SODIUM CHLORIDE 0.9% FLUSH
10.0000 mL | INTRAVENOUS | Status: DC | PRN
  Administered 2021-06-23: 10 mL

## 2021-06-24 ENCOUNTER — Telehealth: Payer: BC Managed Care – PPO | Admitting: Thoracic Surgery (Cardiothoracic Vascular Surgery)

## 2021-06-24 ENCOUNTER — Ambulatory Visit
Admission: RE | Admit: 2021-06-24 | Discharge: 2021-06-24 | Disposition: A | Discharge status: Home / Self Care | Payer: BC Managed Care – PPO | Source: Ambulatory Visit | Attending: Radiation Oncology | Admitting: Radiation Oncology

## 2021-06-24 ENCOUNTER — Other Ambulatory Visit: Payer: Self-pay | Admitting: Hematology

## 2021-06-24 ENCOUNTER — Other Ambulatory Visit: Payer: Self-pay

## 2021-06-24 DIAGNOSIS — Z510 Encounter for antineoplastic radiation therapy: Secondary | ICD-10-CM | POA: Diagnosis not present

## 2021-06-24 DIAGNOSIS — C158 Malignant neoplasm of overlapping sites of esophagus: Secondary | ICD-10-CM | POA: Diagnosis not present

## 2021-06-28 ENCOUNTER — Encounter: Payer: Self-pay | Admitting: Hematology

## 2021-06-28 ENCOUNTER — Other Ambulatory Visit: Payer: Self-pay

## 2021-06-28 ENCOUNTER — Ambulatory Visit: Payer: BC Managed Care – PPO

## 2021-06-28 ENCOUNTER — Ambulatory Visit
Admission: RE | Admit: 2021-06-28 | Discharge: 2021-06-28 | Disposition: A | Discharge status: Home / Self Care | Payer: BC Managed Care – PPO | Source: Ambulatory Visit | Attending: Radiation Oncology | Admitting: Radiation Oncology

## 2021-06-28 ENCOUNTER — Inpatient Hospital Stay: Payer: BC Managed Care – PPO

## 2021-06-28 ENCOUNTER — Inpatient Hospital Stay (HOSPITAL_BASED_OUTPATIENT_CLINIC_OR_DEPARTMENT_OTHER): Payer: BC Managed Care – PPO | Admitting: Hematology

## 2021-06-28 ENCOUNTER — Inpatient Hospital Stay: Payer: BC Managed Care – PPO | Admitting: Nutrition

## 2021-06-28 VITALS — BP 115/76 | HR 87 | Temp 97.7°F | Resp 18 | Ht 66.0 in | Wt 158.9 lb

## 2021-06-28 DIAGNOSIS — K5900 Constipation, unspecified: Secondary | ICD-10-CM | POA: Diagnosis not present

## 2021-06-28 DIAGNOSIS — Z79899 Other long term (current) drug therapy: Secondary | ICD-10-CM | POA: Diagnosis not present

## 2021-06-28 DIAGNOSIS — C158 Malignant neoplasm of overlapping sites of esophagus: Secondary | ICD-10-CM

## 2021-06-28 DIAGNOSIS — Z5111 Encounter for antineoplastic chemotherapy: Secondary | ICD-10-CM | POA: Diagnosis not present

## 2021-06-28 DIAGNOSIS — I10 Essential (primary) hypertension: Secondary | ICD-10-CM | POA: Diagnosis not present

## 2021-06-28 DIAGNOSIS — Z510 Encounter for antineoplastic radiation therapy: Secondary | ICD-10-CM | POA: Diagnosis not present

## 2021-06-28 DIAGNOSIS — E041 Nontoxic single thyroid nodule: Secondary | ICD-10-CM | POA: Diagnosis not present

## 2021-06-28 DIAGNOSIS — R634 Abnormal weight loss: Secondary | ICD-10-CM | POA: Diagnosis not present

## 2021-06-28 DIAGNOSIS — N400 Enlarged prostate without lower urinary tract symptoms: Secondary | ICD-10-CM | POA: Diagnosis not present

## 2021-06-28 DIAGNOSIS — R1319 Other dysphagia: Secondary | ICD-10-CM | POA: Diagnosis not present

## 2021-06-28 LAB — CBC WITH DIFFERENTIAL (CANCER CENTER ONLY)
Abs Immature Granulocytes: 0.06 10*3/uL (ref 0.00–0.07)
Basophils Absolute: 0 10*3/uL (ref 0.0–0.1)
Basophils Relative: 1 %
Eosinophils Absolute: 0.1 10*3/uL (ref 0.0–0.5)
Eosinophils Relative: 4 %
HCT: 33.7 % — ABNORMAL LOW (ref 39.0–52.0)
Hemoglobin: 12.5 g/dL — ABNORMAL LOW (ref 13.0–17.0)
Immature Granulocytes: 2 %
Lymphocytes Relative: 3 %
Lymphs Abs: 0.1 10*3/uL — ABNORMAL LOW (ref 0.7–4.0)
MCH: 34.3 pg — ABNORMAL HIGH (ref 26.0–34.0)
MCHC: 37.1 g/dL — ABNORMAL HIGH (ref 30.0–36.0)
MCV: 92.6 fL (ref 80.0–100.0)
Monocytes Absolute: 0.6 10*3/uL (ref 0.1–1.0)
Monocytes Relative: 21 %
Neutro Abs: 2 10*3/uL (ref 1.7–7.7)
Neutrophils Relative %: 69 %
Platelet Count: 138 10*3/uL — ABNORMAL LOW (ref 150–400)
RBC: 3.64 MIL/uL — ABNORMAL LOW (ref 4.22–5.81)
RDW: 13.9 % (ref 11.5–15.5)
WBC Count: 3 10*3/uL — ABNORMAL LOW (ref 4.0–10.5)
nRBC: 0 % (ref 0.0–0.2)

## 2021-06-28 LAB — CMP (CANCER CENTER ONLY)
ALT: 24 U/L (ref 0–44)
AST: 16 U/L (ref 15–41)
Albumin: 3.3 g/dL — ABNORMAL LOW (ref 3.5–5.0)
Alkaline Phosphatase: 64 U/L (ref 38–126)
Anion gap: 10 (ref 5–15)
BUN: 22 mg/dL (ref 8–23)
CO2: 27 mmol/L (ref 22–32)
Calcium: 9.4 mg/dL (ref 8.9–10.3)
Chloride: 98 mmol/L (ref 98–111)
Creatinine: 0.83 mg/dL (ref 0.61–1.24)
GFR, Estimated: >60 mL/min (ref >60)
Glucose, Bld: 112 mg/dL — ABNORMAL HIGH (ref 70–99)
Potassium: 3.4 mmol/L — ABNORMAL LOW (ref 3.5–5.1)
Sodium: 135 mmol/L (ref 135–145)
Total Bilirubin: 0.8 mg/dL (ref 0.3–1.2)
Total Protein: 6.9 g/dL (ref 6.5–8.1)

## 2021-06-28 NOTE — Progress Notes (Signed)
PICC line removed by Maygan, RN.  Post observation VS WNL.  NO bleeding at site.  Pt denies dizziness, weakness or SOB.  Pt left the unit ambulatory.

## 2021-06-28 NOTE — Progress Notes (Signed)
[place]   Telephone:([phone]) [phone] Fax:(336) [phone]   Clinic Follow up Note   Patient Care Team: Cyndi Bender, Hershal Coria as PCP - General (Physician Assistant) [name], MD as Consulting Physician (Oncology)  Date of Service:  06/28/2021  CHIEF COMPLAINT: f/u of esophageal cancer  CURRENT THERAPY:  Concurrent chemoRT with TC, beginning 05/24/21, switched to FOLFOX 06/20/21.   ASSESSMENT & PLAN:  Nathaniel RADELL is a 65 y.o. male with   1. 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 -EGD performed by Dr. Alessandra Bevels on 04/19/21 showed a partially obstructing, likely malignant, esophageal tumor in the distal esophagus and a polyp in the mid esophagus. Biopsy of both confirmed invasive moderately differentiated adenocarcinoma of the esophagus, Her2 negative. -CT CAP on 05/05/21 showing: esophageal mass not well visualized; indeterminate mildly-enlarged high right paratracheal node near thoracic inlet; no other mediastinal adenopathy or evidence of metastatic disease in abdomen or pelvis; stable small pulmonary nodules bilaterally. -EUS 05/11/21 staged this as T3 N2 Mx. -PET scan 05/16/21 showed: 6.2 cm distal esophageal mass; hypermetabolic upper right paratracheal lymph node.  No other distant metastasis.  -EBUS biopsy on 05/26/21, path from 2R lymph node confirmed malignant cells consistent with his primary esophageal cancer. This node is not resectable by surgery  -He began concurrent chemoRT with weekly TC on 05/24/21. He has been tolerating moderately well over all  -We changed his chemo to FOLFOX on 06/20/21, at reduced dose due to mild neutropenia. He continues to experience fatigue and severe dysphagia/odynophagia. -He does not need IVF today, so we will remove his PICC line. Port placement later this week  -return in one week for  C2 FOLFOX.   2.  Severe dysphagia and odynophagia with weight loss -He lost about 15 lbs from April to July 2022. -He had dysphagia prior to treatment, but he has developed burning with swallowing, secondary to radiation therapy, Carafate is not helpful. -He is supplementing with Boost, 3 a day. He continues to lose weight. -I advised him to continue the lidocaine and hydrocodone, and add liquid tylenol if needed.   3. Constipation -he had previously developed worsening constipation. I recommended he try over-the-counter medications first, such as Miralax or [place].    3. Hypermetabolic thyroid nodule -seen on PET 05/16/21 (and not on head/neck US on 04/18/21) -repeat head/neck US on 06/03/21 showed a 1.1 cm inferior right nodule, corresponding to hypermetabolic focus on PET.  -He is scheduled for biopsy of the nodule on 07/19/21.   4. HTN and BPH -continue medication, will monitor his blood pressure closely during treatment.     PLAN:  -PICC line removal, he is scheduled for port placement later this week  -continue daily radiation -labs, f/u, and C2 FOLFOX in one week  -I sent a message to Dr. Kipp Brood, will order PET on next visit    No problem-specific Assessment & Plan notes found for this encounter.   SUMMARY OF ONCOLOGIC HISTORY: Oncology History  Malignant neoplasm of overlapping sites of esophagus ([place])  04/18/2021 Imaging   ULTRASOUND OF HEAD/NECK SOFT TISSUES  IMPRESSION: No suspicious lymphadenopathy   04/19/2021 Procedure   EGD  Impression: - Esophageal polyp(s) were found. Biopsied. - Partially obstructing, likely malignant esophageal tumor was found in the distal esophagus. - Small hiatal hernia. - A single gastric polyp. Biopsied. - Normal duodenal bulb, first portion of the duodenum  and second portion of the duodenum.   04/19/2021 Pathology Results   FINAL MICROSCOPIC DIAGNOSIS: Stomach, Biopsy - FUNDIC GLAND POLYP. Negative for dysplasia. NO  Helicobacter pylori organisms seen on H and E stain. Esophagus- Distal, Biopsy - INVASIVE MODERATELY DIFFERENTIATED ADENOCARCINOMA OF THE ESOPHAGUS.  - HER2 Study by Immunostain: Negative (score 1+) Esophagus- Mid, Biopsy - INVASIVE MODERATELY DIFFERENTIATED ADENOCARCINOMA OF THE ESOPHAGUS. - HER2 Study by Immunostain: Negative (score 1+)   05/05/2021 Imaging   CT CAP  IMPRESSION: 1. The patient's known esophageal mass is not well visualized. There is mild irregular wall thickening of the distal esophagus. Correlate with previous clinical evaluation. 2. Indeterminate mildly enlarged high right paratracheal node near the thoracic inlet. No other mediastinal adenopathy. 3. No evidence of metastatic disease in the abdomen or pelvis. 4. Stable small pulmonary nodules bilaterally consistent with benign findings. 5. Cholelithiasis, small renal cysts and Aortic Atherosclerosis (ICD10-I70.0).   05/09/2021 Initial Diagnosis   Malignant neoplasm of overlapping sites of esophagus ([place])   05/11/2021 Procedure   Upper EUS  Impression: - Mucosal nodule found in the esophagus. - Partially obstructing, malignant esophageal tumor was found at the gastroesophageal junction. - At least 3 discrete peritumoral lymph nodes - A mass was found in the gastroesophageal junction. A tissue diagnosis was obtained prior to this exam. This is of adenocarcinoma. This was staged T3 N2 Mx by endosonographic criteria. - Unable to traverse GE junction with either EGD scope or radial EUS scope.   05/13/2021 Cancer Staging   Staging form: Esophagus - Adenocarcinoma, AJCC 8th Edition - Clinical stage from 05/13/2021: Stage IVA (cT3, cN2, cM0) - Signed by [name], MD on 05/22/2021   05/16/2021 PET scan   IMPRESSION: 1. Distal esophageal mass measuring about 6.2 cm in length, maximum SUV 13.1. 2. Mildly hypermetabolic and mildly enlarged upper right paratracheal lymph node, 1.3 cm in short axis with maximum SUV 3.6,  concerning for malignant involvement. 3. Hypodense 1.1 cm right thyroid nodule has a maximum SUV substantially above that of the background thyroid. A significant minority of such nodules can harbor thyroid cancer. Recommend thyroid US and biopsy (ref: J Am Coll Radiol. 2015 Feb;12(2): 143-50). 4. 3 by 4 mm right lower lobe pulmonary nodule is similar to 05/05/2021, not hypermetabolic but below sensitive PET-CT size thresholds. Surveillance recommended. 5. Other imaging findings of potential clinical significance: Chronic left maxillary sinusitis. Aortic Atherosclerosis (ICD10-I70.0). Cholelithiasis. Prostatomegaly.   05/24/2021 - 06/13/2021 Chemotherapy          05/26/2021 Pathology Results   FINAL MICROSCOPIC DIAGNOSIS:   A. LYMPH NODE, 2R, FINE NEEDLE ASPIRATION:  - Malignant cells consistent with non-small cell carcinoma   COMMENT:  The cells are positive for cytokeratin 20 and CDX-2 (patchy). TTF-1, NapsinA, cytokeratin 5/6, p40, S100, MelanA, and cytokeratin 7 are negative. The patient's history of esophageal adenocarcinoma is noted and CDX-2 positivity is consistent with a gastrointestinal primary. This cytokeratin7/20 profile is usually seen in lower tract tumors, but this pattern can be seen in gastric/esophageal cases.   06/20/2021 -  Chemotherapy    Patient is on Treatment Plan: GASTROESOPHAGEAL FOLFOX Q14D X 6 CYCLES          INTERVAL HISTORY:  ORLANDIS SANDEN is here for a follow up of esophageal cancer. He was last seen by me on 06/20/21. He presents to the clinic accompanied by his wife. He reports the change in chemo "knocked him back a notch or two." He reports he had a dull headache over the  weekend that has resolved, and fatigue. He reports he was wiped out for 3-4 days after treatment. He reports he can't keep up his energy because he can't eat due to his throat. He reports pain with swallowing constantly. He notes he tried the oxycodone, but it only provided relief  for 30-45 minutes. He described the pain as 8-9/10. He reports he also had chills and night sweats. They don't have a thermometer, so they are unsure if he had a fever.   All other systems were reviewed with the patient and are negative.  MEDICAL HISTORY:  Past Medical History:  Diagnosis Date   BPH (benign prostatic hyperplasia)    Dyslipidemia    Elevated PSA    Esophageal cancer ([place])    Full dentures    GERD (gastroesophageal reflux disease)    History of melanoma excision    07/ 2011  s/p  wide excision left back--- per pathology-- negative maligant --  mild melanocytic atypia   History of palpitations    cardiologist --  dr Shelva Majestic (last note in epic 2014   Hypertension    Sleep apnea     SURGICAL HISTORY: Past Surgical History:  Procedure Laterality Date   COLONOSCOPY  10/24/2007   ESOPHAGOGASTRODUODENOSCOPY (EGD) WITH PROPOFOL N/A 05/11/2021   Procedure: ESOPHAGOGASTRODUODENOSCOPY (EGD) WITH PROPOFOL;  Surgeon: Arta Silence, MD;  Location: [facility];  Service: Endoscopy;  Laterality: N/A;   EUS N/A 05/11/2021   Procedure: ESOPHAGEAL ENDOSCOPIC ULTRASOUND (EUS) RADIAL;  Surgeon: Arta Silence, MD;  Location: [facility];  Service: Endoscopy;  Laterality: N/A;   EYE SURGERY     Lasik surgery on both eyes, but still needs glasses   FINE NEEDLE ASPIRATION  05/26/2021   Procedure: FINE NEEDLE ASPIRATION (FNA) LINEAR;  Surgeon: Garner Nash, DO;  Location: [place];  Service: Pulmonary;;   PROSTATE BIOPSY N/A 07/02/2015   Procedure: BIOPSY TRANSRECTAL ULTRASONIC PROSTATE (TUBP);  Surgeon: Lowella Bandy, MD;  Location: [facility];  Service: Urology;  Laterality: N/A;   PROSTATE BIOPSY N/A 11/22/2015   Procedure: BIOPSY TRANSRECTAL ULTRASONIC PROSTATE (TUBP);  Surgeon: Cleon Gustin, MD;  [facility];  Service: Urology;  Laterality: N/A;   TRANSTHORACIC ECHOCARDIOGRAM  11/19/2012   normal echo/  trivial TR   VIDEO  BRONCHOSCOPY WITH ENDOBRONCHIAL ULTRASOUND N/A 05/26/2021   Procedure: VIDEO BRONCHOSCOPY WITH ENDOBRONCHIAL ULTRASOUND;  Surgeon: Garner Nash, DO;  Location: [place];  Service: Pulmonary;  Laterality: N/A;   WIDE EXCISION MELANOMA LEFT BACK  05/02/2010    I have reviewed the social history and family history with the patient and they are unchanged from previous note.  ALLERGIES:  is allergic to bee venom.  MEDICATIONS:  Current Outpatient Medications  Medication Sig Dispense Refill   aspirin EC 81 MG tablet Take 81 mg by mouth in the morning.     HYDROcodone-acetaminophen (HYCET) 7.5-325 mg/15 ml solution Take 5-10 mLs by mouth every 6 (six) hours as needed for moderate pain. 120 mL 0   lidocaine (XYLOCAINE) 2 % solution Patient: Mix 1part 2% viscous lidocaine, 1part H20. Swallow 87m of diluted mixture, 338m before meals and at bedtime, up to QID 200 mL 3   metoprolol succinate (TOPROL-XL) 100 MG 24 hr tablet Take 100 mg by mouth in the morning.     pantoprazole (PROTONIX) 40 MG tablet Take 1 tablet (40 mg total) by mouth 2 (two) times daily. 30 tablet 1   sucralfate (CARAFATE) 1 g tablet Dissolve 1  tablet in 10 mL H20 and swallow up to QID prn soreness. 40 tablet 5   No current facility-administered medications for this visit.    PHYSICAL EXAMINATION: ECOG PERFORMANCE STATUS: 2 - Symptomatic, <50% confined to bed  Vitals:   06/28/21 1204  BP: 115/76  Pulse: 87  Resp: 18  Temp: 97.7 F (36.5 C)  SpO2: 99%   Wt Readings from Last 3 Encounters:  06/28/21 158 lb 14.4 oz (72.1 kg)  06/21/21 161 lb 4 oz (73.1 kg)  06/20/21 162 lb 14.4 oz (73.9 kg)     GENERAL:alert, no distress and comfortable SKIN: skin color normal, no rashes or significant lesions EYES: normal, Conjunctiva are pink and non-injected, sclera clear  NEURO: alert & oriented x 3 with fluent speech  LABORATORY DATA:  I have reviewed the data as listed CBC Latest Ref Rng & Units 06/28/2021 06/20/2021  06/13/2021  WBC 4.0 - 10.5 K/uL 3.0(L) 1.6(L) 6.5  Hemoglobin 13.0 - 17.0 g/dL 12.5(L) 12.3(L) 13.6  Hematocrit 39.0 - 52.0 % 33.7(L) 34.1(L) 37.5(L)  Platelets 150 - 400 K/uL 138(L) 86(L) 100(L)     CMP Latest Ref Rng & Units 06/28/2021 06/20/2021 06/13/2021  Glucose 70 - 99 mg/dL 112(H) 94 146(H)  BUN 8 - 23 mg/dL _0 Creatinine 0.61 - 1.24 mg/dL 0.83 0.79 0.87  Sodium 135 - 145 mmol/L 135 140 135  Potassium 3.5 - 5.1 mmol/L 3.4(L) 3.8 4.0  Chloride 98 - 111 mmol/L 98 105 100  CO2 22 - 32 mmol/L _1 Calcium 8.9 - 10.3 mg/dL 9.4 9.4 9.3  Total Protein 6.5 - 8.1 g/dL 6.9 6.6 7.0  Total Bilirubin 0.3 - 1.2 mg/dL 0.8 0.7 1.4(H)  Alkaline Phos 38 - 126 U/L 64 63 64  AST 15 - 41 U/L 16 19 12(L)  ALT 0 - 44 U/L _2 RADIOGRAPHIC STUDIES: I have personally reviewed the radiological images as listed and agreed with the findings in the report. No results found.    No orders of the defined types were placed in this encounter.  All questions were answered. The patient knows to call the clinic with any problems, questions or concerns. No barriers to learning was detected. The total time spent in the appointment was 30 minutes.     [name], MD 06/28/2021   I, Wilburn Mylar, am acting as scribe for [name], MD.   I have reviewed the above documentation for accuracy and completeness, and I agree with the above.

## 2021-06-28 NOTE — Progress Notes (Signed)
PICC line removed per MD orders. VSS prior to removal, pt educated on procedure and aftercare. PICC line pulled, 34cm in length (see documentation) and intact. Site is clean dry and intact, covered in Vaseline gauze and op site dressing. Pt tolerated well, no complaints at this time.

## 2021-06-28 NOTE — Patient Instructions (Signed)
PICC Removal, Adult, Care After This sheet gives you information about how to care for yourself after your procedure. Your health care provider may also give you more specific instructions. If you have problems or questions, contact your health care provider. What can I expect after the procedure? After your procedure, it is common to have: Tenderness or soreness. Redness, swelling, or a scab where the PICC was removed (exit site). Follow these instructions at home: For the first 24 hours after the procedure Keep the bandage (dressing) on the exit site clean and dry. Do not remove the dressing until your health care provider tells you to do so. Check your arm often for signs and symptoms of an infection. Check for: A red streak that spreads away from the dressing. Blood or fluid that you can see on the dressing. More redness or swelling. Do not lift anything heavy or do activities that require great effort until your health care provider says it is okay. You should avoid: Lifting weights. Yard work. Any physical activity with repetitive arm movement. Watch closely for any signs of an air bubble in the vein (air embolism). This is a rare but serious complication. If you have signs of air embolism, call 911 immediately and lie down on your left side to keep the air from moving into the lungs. Signs of an air embolism include: Difficulty breathing. Chest pain. Coughing or wheezing. Skin that is pale, blue, cold, or clammy. Rapid pulse. Rapid breathing. Fainting. After 24 Hours have passed:  Remove your dressing as told by your health care provider. Make sure you wash your hands with soap and water before and after you change the dressing. If soap and water are not available, use hand sanitizer. Return to your normal activities as told by your health care provider. A small scab may develop over the exit site. Do not pick at the scab. When bathing or showering, gently wash the exit site with  soap and water. Pat it dry. Watch for signs of infection, such as: Fever or chills. Swollen glands under the arm. More redness, swelling, or soreness in the arm. Blood, fluid, or pus coming from the exit site. Warmth or a bad smell at the exit site. A red streak spreading away from the exit site. General instructions Take over-the-counter and prescription medicines only as told by your health care provider. Do not take any new medicines without checking with your health care provider first. If you were prescribed an antibiotic medicine, apply or take it as told by your health care provider. Do not stop using the antibiotic even if your condition improves. Keep all follow-up visits as told by your health care provider. This is important. Contact a health care provider if: You have a fever or chills. You have soreness, redness, or swelling on your exit site, and it gets worse. You have swollen glands under your arm. You have any of the following symptoms at your exit site: Blood, fluid, or pus. Unusual warmth. A bad smell. A red streak spreading away from the exit site. Get help right away if: You have numbness or tingling in your fingers, hand, or arm. Your arm looks blue and feels cold. You have signs of an air embolism, such as: Difficulty breathing. Chest pain. Coughing or wheezing. Skin that is pale, blue, cold, or clammy. Rapid pulse. Rapid breathing. Fainting. These symptoms may represent a serious problem that is an emergency. Do not wait to see if the symptoms will go away. Get  medical help right away. Call your local emergency services (911 in the [place] Do not drive yourself to the hospital. Summary After your procedure, it is common to have tenderness or soreness, redness, swelling, or a scab at the exit site. Keep the dressing over the exit site clean and dry. Do not remove the dressing until your health care provider tells you to do so. Do not lift anything heavy or do  activities that require great effort until your health care provider says it is okay. Watch closely for any signs of an air embolism. If you have signs of air embolism, call 911 immediately and lie down on your left side. This information is not intended to replace advice given to you by your health care provider. Make sure you discuss any questions you have with your health care provider. Document Revised: 02/14/2020 Document Reviewed: 02/18/2020 Elsevier Patient Education  2022 [employer].

## 2021-06-28 NOTE — Progress Notes (Signed)
Nutrition follow-up completed with patient receiving concurrent chemoradiation therapy for esophageal cancer.  Weight decreased and documented as 158.9 pounds September 6 decreased from 167.6 pounds August 15. This is a 5% decrease over approximately 3 weeks which is significant. Labs reviewed. Severe dysphagia continues.  Patient reports his throat hurts all the time.  It hurts no matter what he eats.  It hurts swallowing saliva.  He does not have much relief from medications.  Patient also reports food temperature changes and food texture changes have not resulted in improved pain with swallowing. Patient reports he is drinking Ensure Plus to supplement what ever oral intake he consumes.  Nutrition diagnosis: Unintended weight loss continues.  Intervention: Provided strategies for increasing oral intake. Encouraged soft foods or full liquid diet for easier consumption although patient says this does not really help him. Encouraged increased oral intake directly after pain medication. Encourage medications as prescribed by MD. Continue bowel regimen and adequate fluid intake.  Monitoring, evaluation, goals: Patient will tolerate adequate calories and protein to minimize weight loss.  Next visit: Tuesday, September 13 during infusion.  **Disclaimer: This note was dictated with voice recognition software. Similar sounding words can inadvertently be transcribed and this note may contain transcription errors which may not have been corrected upon publication of note.**

## 2021-06-29 ENCOUNTER — Other Ambulatory Visit: Payer: Self-pay | Admitting: Student

## 2021-06-29 ENCOUNTER — Ambulatory Visit: Payer: BC Managed Care – PPO

## 2021-06-29 DIAGNOSIS — Z510 Encounter for antineoplastic radiation therapy: Secondary | ICD-10-CM | POA: Diagnosis not present

## 2021-06-29 DIAGNOSIS — C158 Malignant neoplasm of overlapping sites of esophagus: Secondary | ICD-10-CM | POA: Diagnosis not present

## 2021-06-30 ENCOUNTER — Ambulatory Visit (HOSPITAL_COMMUNITY)
Admission: RE | Admit: 2021-06-30 | Discharge: 2021-06-30 | Disposition: A | Discharge status: Home / Self Care | Payer: BC Managed Care – PPO | Source: Ambulatory Visit | Attending: Hematology | Admitting: Hematology

## 2021-06-30 ENCOUNTER — Other Ambulatory Visit: Payer: Self-pay | Admitting: Hematology

## 2021-06-30 ENCOUNTER — Ambulatory Visit: Payer: BC Managed Care – PPO

## 2021-06-30 ENCOUNTER — Telehealth: Payer: Self-pay | Admitting: Hematology

## 2021-06-30 ENCOUNTER — Encounter (HOSPITAL_COMMUNITY): Payer: Self-pay

## 2021-06-30 ENCOUNTER — Other Ambulatory Visit: Payer: Self-pay

## 2021-06-30 DIAGNOSIS — Z87891 Personal history of nicotine dependence: Secondary | ICD-10-CM | POA: Diagnosis not present

## 2021-06-30 DIAGNOSIS — Z91030 Bee allergy status: Secondary | ICD-10-CM | POA: Diagnosis not present

## 2021-06-30 DIAGNOSIS — C159 Malignant neoplasm of esophagus, unspecified: Secondary | ICD-10-CM | POA: Insufficient documentation

## 2021-06-30 DIAGNOSIS — C158 Malignant neoplasm of overlapping sites of esophagus: Secondary | ICD-10-CM

## 2021-06-30 DIAGNOSIS — G4730 Sleep apnea, unspecified: Secondary | ICD-10-CM | POA: Diagnosis not present

## 2021-06-30 DIAGNOSIS — K219 Gastro-esophageal reflux disease without esophagitis: Secondary | ICD-10-CM | POA: Diagnosis not present

## 2021-06-30 DIAGNOSIS — Z7982 Long term (current) use of aspirin: Secondary | ICD-10-CM | POA: Insufficient documentation

## 2021-06-30 DIAGNOSIS — I10 Essential (primary) hypertension: Secondary | ICD-10-CM | POA: Diagnosis not present

## 2021-06-30 DIAGNOSIS — E785 Hyperlipidemia, unspecified: Secondary | ICD-10-CM | POA: Insufficient documentation

## 2021-06-30 DIAGNOSIS — Z452 Encounter for adjustment and management of vascular access device: Secondary | ICD-10-CM | POA: Diagnosis not present

## 2021-06-30 DIAGNOSIS — Z79899 Other long term (current) drug therapy: Secondary | ICD-10-CM | POA: Insufficient documentation

## 2021-06-30 DIAGNOSIS — Z510 Encounter for antineoplastic radiation therapy: Secondary | ICD-10-CM | POA: Diagnosis not present

## 2021-06-30 HISTORY — PX: IR IMAGING GUIDED PORT INSERTION: IMG5740

## 2021-06-30 MED ORDER — LIDOCAINE HCL 1 % IJ SOLN
INTRAMUSCULAR | Status: AC
  Administered 2021-06-30: 20 mL via SUBCUTANEOUS
  Filled 2021-06-30: qty 20

## 2021-06-30 MED ORDER — MIDAZOLAM HCL 2 MG/2ML IJ SOLN
INTRAMUSCULAR | Status: AC
  Filled 2021-06-30: qty 2

## 2021-06-30 MED ORDER — SODIUM CHLORIDE 0.9 % IV SOLN: INTRAVENOUS | Status: DC

## 2021-06-30 MED ORDER — HEPARIN SOD (PORK) LOCK FLUSH 100 UNIT/ML IV SOLN
INTRAVENOUS | Status: AC
  Administered 2021-06-30: 5 [IU]
  Filled 2021-06-30: qty 5

## 2021-06-30 MED ORDER — HYDROCODONE-ACETAMINOPHEN 7.5-325 MG/15ML PO SOLN: 5.0000 mL | Freq: Four times a day (QID) | ORAL | 0 refills | Status: DC | PRN

## 2021-06-30 MED ORDER — FENTANYL CITRATE (PF) 100 MCG/2ML IJ SOLN
INTRAMUSCULAR | Status: DC | PRN
  Administered 2021-06-30: 25 ug via INTRAVENOUS

## 2021-06-30 MED ORDER — MIDAZOLAM HCL 2 MG/2ML IJ SOLN
INTRAMUSCULAR | Status: DC | PRN
  Administered 2021-06-30: 1 mg via INTRAVENOUS

## 2021-06-30 MED ORDER — FENTANYL CITRATE (PF) 100 MCG/2ML IJ SOLN
INTRAMUSCULAR | Status: AC
  Filled 2021-06-30: qty 2

## 2021-06-30 MED ORDER — HEPARIN SODIUM (PORCINE) 1000 UNIT/ML IJ SOLN
INTRAMUSCULAR | Status: DC | PRN
  Administered 2021-06-30: 500 [IU] via INTRAVENOUS

## 2021-06-30 NOTE — Telephone Encounter (Signed)
Scheduled follow-up appointments per 9/6 los. Patient is aware. 

## 2021-06-30 NOTE — H&P (Signed)
Chief Complaint: Patient was seen in consultation today for esophageal cancer  Referring Physician(s): Feng,Yan  Supervising Physician: Aletta Edouard  Patient Status: [facility] - Out-pt  History of Present Illness: Nathaniel Jennings is a 65 y.o. male with past medical history of GERD, HTN, HLD presented to his PCP earlier this year with increasing dysphagia with regurgitation.  He was seen by GI who performed an EGD and identified a distal esophageal mass.  Pathology confirmed esophageal adenocarcinoma.  He recently underwent PICC placement in IR, however this has been removed and he is now referred for Port-A-Cath placement at the request of Dr. Burr Medico.   Patient presents to [facility] today feeling "ok."  He continues to have pain with swallowing and ongoing weight loss.  He has meet with [facility] RD to assist with this.  He has 2 more radiation treatments to his knowledge and complains of skin irritation over the right chest. He has been NPO.  He is aware of the goals of the procedure today and is agreeable to proceed.   Past Medical History:  Diagnosis Date   BPH (benign prostatic hyperplasia)    Dyslipidemia    Elevated PSA    Esophageal cancer ([place])    Full dentures    GERD (gastroesophageal reflux disease)    History of melanoma excision    07/ 2011  s/p  wide excision left back--- per pathology-- negative maligant --  mild melanocytic atypia   History of palpitations    cardiologist --  dr Shelva Majestic (last note in epic 2014   Hypertension    Sleep apnea     Past Surgical History:  Procedure Laterality Date   COLONOSCOPY  10/24/2007   ESOPHAGOGASTRODUODENOSCOPY (EGD) WITH PROPOFOL N/A 05/11/2021   Procedure: ESOPHAGOGASTRODUODENOSCOPY (EGD) WITH PROPOFOL;  Surgeon: Arta Silence, MD;  Location: [facility] ENDOSCOPY;  Service: Endoscopy;  Laterality: N/A;   EUS N/A 05/11/2021   Procedure: ESOPHAGEAL ENDOSCOPIC ULTRASOUND (EUS) RADIAL;  Surgeon: Arta Silence, MD;  Location: [facility]  ENDOSCOPY;  Service: Endoscopy;  Laterality: N/A;   EYE SURGERY     Lasik surgery on both eyes, but still needs glasses   FINE NEEDLE ASPIRATION  05/26/2021   Procedure: FINE NEEDLE ASPIRATION (FNA) LINEAR;  Surgeon: Garner Nash, DO;  Location: [facility];  Service: Pulmonary;;   PROSTATE BIOPSY N/A 07/02/2015   Procedure: BIOPSY TRANSRECTAL ULTRASONIC PROSTATE (TUBP);  Surgeon: Lowella Bandy, MD;  Location: [facility];  Service: Urology;  Laterality: N/A;   PROSTATE BIOPSY N/A 11/22/2015   Procedure: BIOPSY TRANSRECTAL ULTRASONIC PROSTATE (TUBP);  Surgeon: Cleon Gustin, MD;  Location: [facility];  Service: Urology;  Laterality: N/A;   TRANSTHORACIC ECHOCARDIOGRAM  11/19/2012   normal echo/  trivial TR   VIDEO BRONCHOSCOPY WITH ENDOBRONCHIAL ULTRASOUND N/A 05/26/2021   Procedure: VIDEO BRONCHOSCOPY WITH ENDOBRONCHIAL ULTRASOUND;  Surgeon: Garner Nash, DO;  Location: [facility];  Service: Pulmonary;  Laterality: N/A;   WIDE EXCISION MELANOMA LEFT BACK  05/02/2010    Allergies: Bee venom  Medications: Prior to Admission medications   Medication Sig Start Date End Date Taking? Authorizing Provider  aspirin EC 81 MG tablet Take 81 mg by mouth in the morning.   Yes [provider]  HYDROcodone-acetaminophen (HYCET) 7.5-325 mg/15 ml solution Take 5-10 mLs by mouth every 6 (six) hours as needed for moderate pain. 06/20/21  Yes [name], MD  lidocaine (XYLOCAINE) 2 % solution Patient: Mix 1part 2% viscous lidocaine, 1part H20. Swallow 62m  of diluted mixture, 67mn before meals and at bedtime, up to QID 06/06/21  Yes SEppie Gibson MD  metoprolol succinate (TOPROL-XL) 100 MG 24 hr tablet Take 100 mg by mouth in the morning. 11/03/17  Yes [provider]  pantoprazole (PROTONIX) 40 MG tablet Take 1 tablet (40 mg total) by mouth 2 (two) times daily. 05/23/21  Yes FTruitt Merle MD  sucralfate (CARAFATE) 1 g tablet Dissolve 1 tablet in 10 mL H20  and swallow up to QID prn soreness. 06/06/21   SEppie Gibson MD  prochlorperazine (COMPAZINE) 10 MG tablet Take 1 tablet (10 mg total) by mouth every 6 (six) hours as needed (Nausea or vomiting). 05/23/21 06/13/21  FTruitt Merle MD     Family History  Problem Relation Age of Onset   Kidney failure Mother        died age 24148  Pneumonia Father        died age 65   Social History   Socioeconomic History   Marital status: Single    Spouse name: Not on file   Number of children: 2   Years of education: Not on file   Highest education level: Not on file  Occupational History   Occupation: CArchitect Tobacco Use   Smoking status: Former    Packs/day: 2.00    Years: 42.00    Pack years: 84.00    Types: Cigarettes    Quit date: 03/15/2011    Years since quitting: 10.3   Smokeless tobacco: Current    Types: Snuff   Tobacco comments:    occasional dip tobacco  Vaping Use   Vaping Use: Never used  Substance and Sexual Activity   Alcohol use: Yes    Alcohol/week: 2.0 standard drinks    Types: 2 Cans of beer per week    Comment: for 40 years   Drug use: Never   Sexual activity: Not on file  Other Topics Concern   Not on file  Social History Narrative   Lives alone.  CArchitect  Social Determinants of HRadio broadcast assistantStrain: Low Risk    Difficulty of Paying Living Expenses: Not hard at all  Food Insecurity: No Food Insecurity   Worried About RCharity fundraiserin the Last Year: Never true   RArboriculturistin the Last Year: Never true  Transportation Needs: No Transportation Needs   Lack of Transportation (Medical): No   Lack of Transportation (Non-Medical): No  Physical Activity: Not on file  Stress: Not on file  Social Connections: Not on file     Review of Systems: A 12 point ROS discussed and pertinent positives are indicated in the HPI above.  All other systems are negative.  Review of Systems  Constitutional:  Positive for fatigue. Negative for  fever.  HENT:  Positive for trouble swallowing.   Respiratory:  Negative for cough and shortness of breath.   Cardiovascular:  Negative for chest pain.  Gastrointestinal:  Negative for abdominal pain.  Genitourinary:  Negative for dysuria.  Musculoskeletal:  Negative for back pain.  Psychiatric/Behavioral:  Negative for behavioral problems and confusion.    Vital Signs: There were no vitals taken for this visit.  Physical Exam Vitals and nursing note reviewed.  Constitutional:      General: He is not in acute distress.    Appearance: Normal appearance. He is not ill-appearing.  HENT:     Mouth/Throat:     Mouth: Mucous membranes are moist.  Pharynx: Oropharynx is clear.  Neck:     Comments: Evidence of skin irritation/mild excoriations over the right neck and chest.  Cardiovascular:     Rate and Rhythm: Normal rate and regular rhythm.  Pulmonary:     Effort: Pulmonary effort is normal. No respiratory distress.     Breath sounds: Normal breath sounds.  Abdominal:     General: Abdomen is flat. There is no distension.     Palpations: Abdomen is soft.  Musculoskeletal:     Cervical back: Normal range of motion.  Skin:    General: Skin is warm.     Findings: Erythema present.  Neurological:     General: No focal deficit present.     Mental Status: He is alert and oriented to person, place, and time. Mental status is at baseline.  Psychiatric:        Mood and Affect: Mood normal.        Behavior: Behavior normal.        Thought Content: Thought content normal.        Judgment: Judgment normal.     MD Evaluation Airway: WNL Heart: WNL Abdomen: WNL Chest/ Lungs: WNL ASA  Classification: 3 Mallampati/Airway Score: Two   Imaging: US Soft Tissue Head/Neck  Result Date: 06/04/2021 CLINICAL DATA:  Hypermetabolic right thyroid nodule on PET-CT EXAM: THYROID ULTRASOUND TECHNIQUE: Ultrasound examination of the thyroid gland and adjacent soft tissues was performed.  COMPARISON:  PET-CT 05/16/2021 FINDINGS: Parenchymal Echotexture: Mildly heterogenous Isthmus: 0.4 cm thickness Right lobe: 4.1 x 1.8 x 2.4 cm Left lobe: 3.5 x 1.5 x 1.7 cm _________________________________________________________ Estimated total number of nodules >/= 1 cm: 1 Number of spongiform nodules >/=  2 cm not described below (TR1): 0 Number of mixed cystic and solid nodules >/= 1.5 cm not described below (TR2): 0 _________________________________________________________ Nodule # 1: Location: Right; inferior lateral, corresponds to PET-CT findings Maximum size: 1.1 cm; Other 2 dimensions: 1 x 1 cm Composition: solid/almost completely solid (2) Echogenicity: hyperechoic (1) Shape: not taller-than-wide (0) Margins: ill-defined (0) Echogenic foci: none (0) ACR TI-RADS total points: 3. ACR TI-RADS risk category: TR3 (3 points). ACR TI-RADS recommendations: Recommend FNA biopsy _________________________________________________________ 0.5 cm benign colloid cyst, inferomedial right lobe IMPRESSION: 1.1 cm inferior right nodule corresponds to hypermetabolic focus on PET-CT. Recommend FNA biopsy. The above is in keeping with the ACR TI-RADS recommendations - J Am Coll Radiol 2017;14:587-595. Electronically Signed   By: Lucrezia Europe M.D.   On: 06/04/2021 09:11   IR PICC PLACEMENT RIGHT >5 YRS INC IMG GUIDE  Result Date: 06/21/2021 INDICATION: History of esophageal cancer.  Request for PICC line placement. EXAM: ULTRASOUND AND FLUOROSCOPIC GUIDED PICC LINE INSERTION MEDICATIONS: 1% lidocaine CONTRAST:  None FLUOROSCOPY TIME:  30 seconds (2 mGy) COMPLICATIONS: None immediate. TECHNIQUE: The procedure, risks, benefits, and alternatives were explained to the patient and informed written consent was obtained. The right upper extremity was prepped with chlorhexidine in a sterile fashion, and a sterile drape was applied covering the operative field. Maximum barrier sterile technique with sterile gowns and gloves were used  for the procedure. A timeout was performed prior to the initiation of the procedure. Local anesthesia was provided with 1% lidocaine. After the overlying soft tissues were anesthetized with 1% lidocaine, a micropuncture kit was utilized to access the right brachial vein. Real-time ultrasound guidance was utilized for vascular access including the acquisition of a permanent ultrasound image documenting patency of the accessed vessel. A guidewire was advanced to the level  of the superior caval-atrial junction for measurement purposes and the PICC line was cut to length. A peel-away sheath was placed and a 34 cm, 5 [place], dual lumen was inserted to level of the superior caval-atrial junction. A post procedure spot fluoroscopic was obtained. The catheter easily aspirated and flushed and was secured in place. A dressing was placed. The patient tolerated the procedure well without immediate post procedural complication. FINDINGS: After catheter placement, the tip lies within the superior cavoatrial junction. The catheter aspirates and flushes normally and is ready for immediate use. IMPRESSION: Successful ultrasound and fluoroscopic guided placement of a right brachial vein approach, 34 cm, 5 French, dual lumen PICC with tip at the superior caval-atrial junction. The PICC line is ready for immediate use. Read by: Durenda Guthrie, PA-C Electronically Signed   By: Sandi Mariscal M.D.   On: 06/21/2021 08:56    Labs:  CBC: Recent Labs    06/06/21 0956 06/13/21 0819 06/20/21 1114 06/28/21 1134  WBC 3.2* 6.5 1.6* 3.0*  HGB 13.5 13.6 12.3* 12.5*  HCT 36.7* 37.5* 34.1* 33.7*  PLT 118* 100* 86* 138*    COAGS: No results for input(s): INR, APTT in the last 8760 hours.  BMP: Recent Labs    06/06/21 0956 06/13/21 0819 06/20/21 1114 06/28/21 1134  NA 140 135 140 135  K 4.2 4.0 3.8 3.4*  CL 105 100 105 98  CO2 _0 GLUCOSE 97 146* 94 112*  BUN _1 CALCIUM 9.4 9.3 9.4 9.4  CREATININE 0.83 0.87  0.79 0.83  GFRNONAA >60 >60 >60 >60    LIVER FUNCTION TESTS: Recent Labs    06/06/21 0956 06/13/21 0819 06/20/21 1114 06/28/21 1134  BILITOT 0.6 1.4* 0.7 0.8  AST 14* 12* 19 16  ALT _2 ALKPHOS 61 64 63 64  PROT 6.5 7.0 6.6 6.9  ALBUMIN 3.7 3.6 3.4* 3.3*    TUMOR MARKERS: No results for input(s): AFPTM, CEA, CA199, CHROMGRNA in the last 8760 hours.  Assessment and Plan: Patient with past medical history of esophageal cancer presents with complaint of poor venous access.  IR consulted for Port-A-Cath placement at the request of Dr. Burr Medico. Case reviewed by Dr. Kathlene Cote who approves patient for procedure.  Patient presents today feeling ok.  He has started chemo/radiation and does have difficulty swallowing.  He does have skin excorations and erythema over the right neck and chest related to radiation. He has been NPO and is not currently on blood thinners.   Risks and benefits of image guided port-a-catheter placement was discussed with the patient including, but not limited to bleeding, infection, pneumothorax, or fibrin sheath development and need for additional procedures.  All of the patient's questions were answered, patient is agreeable to proceed. Consent signed and in chart.  Thank you for this interesting consult.  I greatly enjoyed meeting ASHLEE BEWLEY and look forward to participating in their care.  A copy of this report was sent to the requesting provider on this date.  Electronically Signed: Docia Barrier, PA 06/30/2021, 2:31 PM   I spent a total of  30 Minutes   in face to face in clinical consultation, greater than 50% of which was counseling/coordinating care for esophageal cancer.

## 2021-06-30 NOTE — Discharge Instructions (Signed)

## 2021-06-30 NOTE — Procedures (Signed)
Interventional Radiology Procedure Note  Procedure: Single Lumen Power Port Placement    Access:  Left IJ vein.  Findings: Catheter tip positioned at SVC/RA junction. Port is ready for immediate use.   Complications: None  EBL: < 10 mL  Recommendations:  - Ok to shower in 24 hours - Do not submerge for 7 days - Routine line care   Pina Sirianni T. Allisson Schindel, M.D Pager:  [phone]   

## 2021-07-01 ENCOUNTER — Ambulatory Visit
Admission: RE | Admit: 2021-07-01 | Discharge: 2021-07-01 | Disposition: A | Discharge status: Home / Self Care | Payer: BC Managed Care – PPO | Source: Ambulatory Visit | Attending: Radiation Oncology | Admitting: Radiation Oncology

## 2021-07-01 ENCOUNTER — Other Ambulatory Visit: Payer: Self-pay | Admitting: Thoracic Surgery (Cardiothoracic Vascular Surgery)

## 2021-07-01 ENCOUNTER — Telehealth: Payer: Self-pay

## 2021-07-01 ENCOUNTER — Ambulatory Visit: Payer: BC Managed Care – PPO

## 2021-07-01 ENCOUNTER — Ambulatory Visit (INDEPENDENT_AMBULATORY_CARE_PROVIDER_SITE_OTHER): Payer: BC Managed Care – PPO | Admitting: Thoracic Surgery (Cardiothoracic Vascular Surgery)

## 2021-07-01 DIAGNOSIS — Z510 Encounter for antineoplastic radiation therapy: Secondary | ICD-10-CM | POA: Diagnosis not present

## 2021-07-01 DIAGNOSIS — C158 Malignant neoplasm of overlapping sites of esophagus: Secondary | ICD-10-CM

## 2021-07-01 NOTE — Progress Notes (Signed)
     [address]       [place] [address]             [phone]       Patient: Home Provider: Office Consent for Telemedicine visit obtained.  Today's visit was completed via a real-time telehealth (see specific modality noted below). The patient/authorized person provided oral consent at the time of the visit to engage in a telemedicine encounter with the present provider at [facility]. The patient/authorized person was informed of the potential benefits, limitations, and risks of telemedicine. The patient/authorized person expressed understanding that the laws that protect confidentiality also apply to telemedicine. The patient/authorized person acknowledged understanding that telemedicine does not provide emergency services and that he or she would need to call 911 or proceed to the nearest hospital for help if such a need arose.   Total time spent in the clinical discussion 10 minutes.  Telehealth Modality: Phone visit (audio only)  I had a telephone visit with Mr. Basch.  Mr. Medal.  He states that he is doing well.  He has lost a little bit of weight and has been unable to tolerate much food due to the burning.  He is hungry however.  He is set to complete his radiation on 07/04/2021.  He will undergo 2-3 cycles of FOLFOX in the next few weeks.   Hx: T3 N2 M0 stage IIIb poorly differentiated adenocarcinoma the distal esophagus.  He also has a cervical lymph node which was positive for metastasis.  Dx Weight: 168 Current Weight: 158   Radiation completion date: 9/12 Operative Window: Oct 24 - Nov 21st    Plan: I will see him in 1 month with a PET/CT.

## 2021-07-01 NOTE — Telephone Encounter (Signed)
Notified Patient of Prior Authorization approval for Hydrocodone-Acetaminophen 7.5-325 mg/15 ml Solution. Medication is authorized 07/01/2021 through 06/30/2022

## 2021-07-04 ENCOUNTER — Ambulatory Visit: Payer: BC Managed Care – PPO

## 2021-07-04 ENCOUNTER — Other Ambulatory Visit: Payer: Self-pay

## 2021-07-04 ENCOUNTER — Ambulatory Visit
Admission: RE | Admit: 2021-07-04 | Discharge: 2021-07-04 | Disposition: A | Discharge status: Home / Self Care | Payer: BC Managed Care – PPO | Source: Ambulatory Visit | Attending: Radiation Oncology | Admitting: Radiation Oncology

## 2021-07-04 ENCOUNTER — Encounter: Payer: Self-pay | Admitting: Radiation Oncology

## 2021-07-04 DIAGNOSIS — G4733 Obstructive sleep apnea (adult) (pediatric): Secondary | ICD-10-CM | POA: Diagnosis not present

## 2021-07-04 DIAGNOSIS — R002 Palpitations: Secondary | ICD-10-CM | POA: Diagnosis not present

## 2021-07-04 DIAGNOSIS — Z510 Encounter for antineoplastic radiation therapy: Secondary | ICD-10-CM | POA: Diagnosis not present

## 2021-07-04 DIAGNOSIS — C158 Malignant neoplasm of overlapping sites of esophagus: Secondary | ICD-10-CM | POA: Diagnosis not present

## 2021-07-04 DIAGNOSIS — C159 Malignant neoplasm of esophagus, unspecified: Secondary | ICD-10-CM | POA: Diagnosis not present

## 2021-07-04 DIAGNOSIS — I10 Essential (primary) hypertension: Secondary | ICD-10-CM | POA: Diagnosis not present

## 2021-07-05 ENCOUNTER — Inpatient Hospital Stay: Payer: BC Managed Care – PPO | Admitting: Nutrition

## 2021-07-05 ENCOUNTER — Inpatient Hospital Stay: Payer: BC Managed Care – PPO

## 2021-07-05 ENCOUNTER — Encounter: Payer: Self-pay | Admitting: Hematology

## 2021-07-05 ENCOUNTER — Inpatient Hospital Stay (HOSPITAL_BASED_OUTPATIENT_CLINIC_OR_DEPARTMENT_OTHER): Payer: BC Managed Care – PPO | Admitting: Hematology

## 2021-07-05 ENCOUNTER — Ambulatory Visit: Payer: BC Managed Care – PPO

## 2021-07-05 VITALS — BP 108/66 | HR 86 | Temp 98.0°F | Resp 18 | Ht 66.0 in | Wt 154.2 lb

## 2021-07-05 DIAGNOSIS — Z5111 Encounter for antineoplastic chemotherapy: Secondary | ICD-10-CM | POA: Diagnosis not present

## 2021-07-05 DIAGNOSIS — R1319 Other dysphagia: Secondary | ICD-10-CM | POA: Diagnosis not present

## 2021-07-05 DIAGNOSIS — C158 Malignant neoplasm of overlapping sites of esophagus: Secondary | ICD-10-CM | POA: Diagnosis not present

## 2021-07-05 DIAGNOSIS — N400 Enlarged prostate without lower urinary tract symptoms: Secondary | ICD-10-CM | POA: Diagnosis not present

## 2021-07-05 DIAGNOSIS — K5900 Constipation, unspecified: Secondary | ICD-10-CM | POA: Diagnosis not present

## 2021-07-05 DIAGNOSIS — Z79899 Other long term (current) drug therapy: Secondary | ICD-10-CM | POA: Diagnosis not present

## 2021-07-05 DIAGNOSIS — Z95828 Presence of other vascular implants and grafts: Secondary | ICD-10-CM

## 2021-07-05 DIAGNOSIS — I10 Essential (primary) hypertension: Secondary | ICD-10-CM | POA: Diagnosis not present

## 2021-07-05 DIAGNOSIS — E041 Nontoxic single thyroid nodule: Secondary | ICD-10-CM | POA: Diagnosis not present

## 2021-07-05 DIAGNOSIS — R634 Abnormal weight loss: Secondary | ICD-10-CM | POA: Diagnosis not present

## 2021-07-05 LAB — CMP (CANCER CENTER ONLY)
ALT: 25 U/L (ref 0–44)
AST: 19 U/L (ref 15–41)
Albumin: 3.2 g/dL — ABNORMAL LOW (ref 3.5–5.0)
Alkaline Phosphatase: 68 U/L (ref 38–126)
Anion gap: 9 (ref 5–15)
BUN: 23 mg/dL (ref 8–23)
CO2: 26 mmol/L (ref 22–32)
Calcium: 9.3 mg/dL (ref 8.9–10.3)
Chloride: 104 mmol/L (ref 98–111)
Creatinine: 0.78 mg/dL (ref 0.61–1.24)
GFR, Estimated: >60 mL/min (ref >60)
Glucose, Bld: 162 mg/dL — ABNORMAL HIGH (ref 70–99)
Potassium: 3.3 mmol/L — ABNORMAL LOW (ref 3.5–5.1)
Sodium: 139 mmol/L (ref 135–145)
Total Bilirubin: 0.7 mg/dL (ref 0.3–1.2)
Total Protein: 6.5 g/dL (ref 6.5–8.1)

## 2021-07-05 LAB — CBC WITH DIFFERENTIAL (CANCER CENTER ONLY)
Abs Immature Granulocytes: 0.14 10*3/uL — ABNORMAL HIGH (ref 0.00–0.07)
Basophils Absolute: 0 10*3/uL (ref 0.0–0.1)
Basophils Relative: 1 %
Eosinophils Absolute: 0.1 10*3/uL (ref 0.0–0.5)
Eosinophils Relative: 4 %
HCT: 30.7 % — ABNORMAL LOW (ref 39.0–52.0)
Hemoglobin: 11.1 g/dL — ABNORMAL LOW (ref 13.0–17.0)
Immature Granulocytes: 5 %
Lymphocytes Relative: 4 %
Lymphs Abs: 0.1 10*3/uL — ABNORMAL LOW (ref 0.7–4.0)
MCH: 34.5 pg — ABNORMAL HIGH (ref 26.0–34.0)
MCHC: 36.2 g/dL — ABNORMAL HIGH (ref 30.0–36.0)
MCV: 95.3 fL (ref 80.0–100.0)
Monocytes Absolute: 0.7 10*3/uL (ref 0.1–1.0)
Monocytes Relative: 21 %
Neutro Abs: 2 10*3/uL (ref 1.7–7.7)
Neutrophils Relative %: 65 %
Platelet Count: 129 10*3/uL — ABNORMAL LOW (ref 150–400)
RBC: 3.22 MIL/uL — ABNORMAL LOW (ref 4.22–5.81)
RDW: 15.5 % (ref 11.5–15.5)
WBC Count: 3.1 10*3/uL — ABNORMAL LOW (ref 4.0–10.5)
nRBC: 0 % (ref 0.0–0.2)

## 2021-07-05 MED ORDER — DEXTROSE 5 % IV SOLN: Freq: Once | INTRAVENOUS | Status: AC

## 2021-07-05 MED ORDER — SODIUM CHLORIDE 0.9% FLUSH
10.0000 mL | Freq: Once | INTRAVENOUS | Status: AC
  Administered 2021-07-05: 10 mL via INTRAVENOUS

## 2021-07-05 MED ORDER — SODIUM CHLORIDE 0.9 % IV SOLN
2400.0000 mg/m2 | INTRAVENOUS | Status: DC
  Administered 2021-07-05: 4500 mg via INTRAVENOUS
  Filled 2021-07-05: qty 90

## 2021-07-05 MED ORDER — SODIUM CHLORIDE 0.9 % IV SOLN
10.0000 mg | Freq: Once | INTRAVENOUS | Status: AC
  Administered 2021-07-05: 10 mg via INTRAVENOUS
  Filled 2021-07-05: qty 10

## 2021-07-05 MED ORDER — OXALIPLATIN CHEMO INJECTION 100 MG/20ML
85.0000 mg/m2 | Freq: Once | INTRAVENOUS | Status: AC
  Administered 2021-07-05: 160 mg via INTRAVENOUS
  Filled 2021-07-05: qty 32

## 2021-07-05 MED ORDER — LEUCOVORIN CALCIUM INJECTION 350 MG
400.0000 mg/m2 | Freq: Once | INTRAVENOUS | Status: AC
  Administered 2021-07-05: 748 mg via INTRAVENOUS
  Filled 2021-07-05: qty 37.4

## 2021-07-05 MED ORDER — PALONOSETRON HCL INJECTION 0.25 MG/5ML
0.2500 mg | Freq: Once | INTRAVENOUS | Status: AC
  Administered 2021-07-05: 0.25 mg via INTRAVENOUS
  Filled 2021-07-05: qty 5

## 2021-07-05 NOTE — Progress Notes (Signed)
Per Dr Burr Medico, increase oxali '85mg'$ /m2 and 5FU pump '2400mg'$ /m2

## 2021-07-05 NOTE — Patient Instructions (Signed)
[facility]   Discharge Instructions: Thank you for choosing [facility] to provide your oncology and hematology care.   If you have a lab appointment with the [facility], please go directly to the [facility] and check in at the registration area.   Wear comfortable clothing and clothing appropriate for easy access to any Portacath or PICC line.   We strive to give you quality time with your provider. You may need to reschedule your appointment if you arrive late (15 or more minutes).  Arriving late affects you and other patients whose appointments are after yours.  Also, if you miss three or more appointments without notifying the office, you may be dismissed from the clinic at the provider's discretion.      For prescription refill requests, have your pharmacy contact our office and allow 72 hours for refills to be completed.    Today you received the following chemotherapy and/or immunotherapy agents: oxaliplatin, leucovorin, and fluorouracil.      To help prevent nausea and vomiting after your treatment, we encourage you to take your nausea medication as directed.  BELOW ARE SYMPTOMS THAT SHOULD BE REPORTED IMMEDIATELY: *FEVER GREATER THAN 100.4 F (38 C) OR HIGHER *CHILLS OR SWEATING *NAUSEA AND VOMITING THAT IS NOT CONTROLLED WITH YOUR NAUSEA MEDICATION *UNUSUAL SHORTNESS OF BREATH *UNUSUAL BRUISING OR BLEEDING *URINARY PROBLEMS (pain or burning when urinating, or frequent urination) *BOWEL PROBLEMS (unusual diarrhea, constipation, pain near the anus) TENDERNESS IN MOUTH AND THROAT WITH OR WITHOUT PRESENCE OF ULCERS (sore throat, sores in mouth, or a toothache) UNUSUAL RASH, SWELLING OR PAIN  UNUSUAL VAGINAL DISCHARGE OR ITCHING   Items with * indicate a potential emergency and should be followed up as soon as possible or go to the Emergency Department if any problems should occur.  Please show the CHEMOTHERAPY ALERT CARD or  IMMUNOTHERAPY ALERT CARD at check-in to the Emergency Department and triage nurse.  Should you have questions after your visit or need to cancel or reschedule your appointment, please contact [facility]  Dept: [phone]  and follow the prompts.  Office hours are 8:00 a.m. to 4:30 p.m. Monday - Friday. Please note that voicemails left after 4:00 p.m. may not be returned until the following business day.  We are closed weekends and major holidays. You have access to a nurse at all times for urgent questions. Please call the main number to the clinic Dept: [phone] and follow the prompts.   For any non-urgent questions, you may also contact your provider using MyChart. We now offer e-Visits for anyone 71 and older to request care online for non-urgent symptoms. For details visit [email or web address].   Also download the MyChart app! Go to the app store, search "MyChart", open the app, select [facility], and log in with your MyChart username and password.  Due to Covid, a mask is required upon entering the hospital/clinic. If you do not have a mask, one will be given to you upon arrival. For doctor visits, patients may have 1 support person aged 55 or older with them. For treatment visits, patients cannot have anyone with them due to current Covid guidelines and our immunocompromised population.

## 2021-07-05 NOTE — Progress Notes (Signed)
Nutrition follow-up completed with patient during infusion for esophageal cancer.  Patient reports he feels much better this week.  States he is able to swallow better.  His throat continues to be very sore.  States he ate more today than he has for a while. He is only consuming approximately 1 oral nutrition supplement daily and this is sporadic.  He is tiring of the flavors of oral nutrition supplements. Weight noted 154.2 pounds September 12.  This is decreased from 167.6 pounds August 15.  This is an 8% weight loss which is significant.   Denies nausea, vomiting, constipation, and diarrhea.  Nutrition diagnosis: Unintended weight loss continues.  Intervention: Stressed importance of trying to increase oral intake now that he was swallowing easier. Encouraged clear liquid oral nutrition supplements for added calories and protein.  Provided samples. Continue strategies for small frequent meals and snacks. Continue bowel regimen and adequate fluid intake.  Monitoring, evaluation, goals: Patient will tolerate increased calories and protein to minimize further weight loss.  Next visit: Monday 9/26 during infusion.  **Disclaimer: This note was dictated with voice recognition software. Similar sounding words can inadvertently be transcribed and this note may contain transcription errors which may not have been corrected upon publication of note.**

## 2021-07-05 NOTE — Progress Notes (Signed)
[place]   [phone] [phone]   Clinic Follow up Note   Patient Care Team: Cyndi Bender, Hershal Coria as PCP - General (Physician Assistant) [name], MD as Consulting Physician (Oncology)  Date of Service:  07/05/2021  CHIEF COMPLAINT: f/u of esophageal cancer  CURRENT THERAPY:  FOLFOX, dose reduced due to neutropenia, starting 06/20/21  ASSESSMENT & PLAN:  Nathaniel Jennings is a 65 y.o. male with   1. 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 -EGD performed by Dr. Alessandra Bevels on 04/19/21 showed a partially obstructing, likely malignant, esophageal tumor in the distal esophagus and a polyp in the mid esophagus. Biopsy of both confirmed invasive moderately differentiated adenocarcinoma of the esophagus, Her2 negative. -CT CAP on 05/05/21 showing: esophageal mass not well visualized; indeterminate mildly-enlarged high right paratracheal node near thoracic inlet; no other mediastinal adenopathy or evidence of metastatic disease in abdomen or pelvis; stable small pulmonary nodules bilaterally. -EUS 05/11/21 staged this as T3 N2 Mx. -PET scan 05/16/21 showed: 6.2 cm distal esophageal mass; hypermetabolic upper right paratracheal lymph node.  No other distant metastasis.  -EBUS biopsy on 05/26/21, path from 2R lymph node confirmed malignant cells consistent with his primary esophageal cancer. This node is not resectable by surgery  -He began concurrent chemoRT with weekly TC on 05/24/21.  -We changed his chemo to FOLFOX on 06/20/21, at reduced dose for first cycle due to mild neutropenia and concurrent RT. Port placed 06/30/21. -He completed radiation therapy yesterday, 07/04/21. -labs reviewed, adequate to proceed with C2 FOLFOX. Plan for one more cycle before he proceeds with surgery. -scheduled for restaging PET on  08/04/21 in anticipation of proceeding with surgery in late 07/2021.   2.  Severe dysphagia and odynophagia with weight loss -He lost about 15 lbs from April to July 2022. -He had dysphagia prior to treatment, but he has developed burning with swallowing, secondary to radiation therapy, Carafate is not helpful. -He is supplementing with Boost, 3 a day. He continues to lose weight. -He reports his dysphagia is finally improving.   3. Constipation -he had previously developed worsening constipation. I recommended he try over-the-counter medications first, such as Miralax or [place].    3. Hypermetabolic thyroid nodule -seen on PET 05/16/21 (and not on head/neck US on 04/18/21) -repeat head/neck US on 06/03/21 showed a 1.1 cm inferior right nodule, corresponding to hypermetabolic focus on PET.  -He is scheduled for biopsy of the nodule on 07/19/21. We will reschedule this to later in 07/2021.   4. HTN and BPH -continue medication, will monitor his blood pressure closely during treatment.     PLAN:  -proceed with C2 FOLFOX today at full dose (no 5-fu bolus) -will reschedule thyroid biopsy -labs, f/u, and C3 FOLFOX on 07/18/21 -PET scan on 08/04/21   No problem-specific Assessment & Plan notes found for this encounter.   SUMMARY OF ONCOLOGIC HISTORY: Oncology History  Malignant neoplasm of overlapping sites of esophagus ([place])  04/18/2021 Imaging   ULTRASOUND OF HEAD/NECK SOFT TISSUES  IMPRESSION: No suspicious lymphadenopathy   04/19/2021 Procedure   EGD  Impression: - Esophageal polyp(s) were found. Biopsied. - Partially obstructing, likely malignant esophageal tumor was found in the distal esophagus. - Small hiatal hernia. - A single gastric polyp. Biopsied. - Normal duodenal bulb, first portion of the duodenum and second portion of the duodenum.   04/19/2021 Pathology  Results   FINAL MICROSCOPIC DIAGNOSIS: Stomach, Biopsy - FUNDIC GLAND POLYP. Negative for dysplasia. NO  Helicobacter pylori organisms seen on H and E stain. Esophagus- Distal, Biopsy - INVASIVE MODERATELY DIFFERENTIATED ADENOCARCINOMA OF THE ESOPHAGUS.  - HER2 Study by Immunostain: Negative (score 1+) Esophagus- Mid, Biopsy - INVASIVE MODERATELY DIFFERENTIATED ADENOCARCINOMA OF THE ESOPHAGUS. - HER2 Study by Immunostain: Negative (score 1+)   05/05/2021 Imaging   CT CAP  IMPRESSION: 1. The patient's known esophageal mass is not well visualized. There is mild irregular wall thickening of the distal esophagus. Correlate with previous clinical evaluation. 2. Indeterminate mildly enlarged high right paratracheal node near the thoracic inlet. No other mediastinal adenopathy. 3. No evidence of metastatic disease in the abdomen or pelvis. 4. Stable small pulmonary nodules bilaterally consistent with benign findings. 5. Cholelithiasis, small renal cysts and Aortic Atherosclerosis (ICD10-I70.0).   05/09/2021 Initial Diagnosis   Malignant neoplasm of overlapping sites of esophagus ([place])   05/11/2021 Procedure   Upper EUS  Impression: - Mucosal nodule found in the esophagus. - Partially obstructing, malignant esophageal tumor was found at the gastroesophageal junction. - At least 3 discrete peritumoral lymph nodes - A mass was found in the gastroesophageal junction. A tissue diagnosis was obtained prior to this exam. This is of adenocarcinoma. This was staged T3 N2 Mx by endosonographic criteria. - Unable to traverse GE junction with either EGD scope or radial EUS scope.   05/13/2021 Cancer Staging   Staging form: Esophagus - Adenocarcinoma, AJCC 8th Edition - Clinical stage from 05/13/2021: Stage IVA (cT3, cN2, cM0) - Signed by [name], MD on 05/22/2021   05/16/2021 PET scan   IMPRESSION: 1. Distal esophageal mass measuring about 6.2 cm in length, maximum SUV 13.1. 2. Mildly hypermetabolic and mildly enlarged upper right paratracheal lymph node, 1.3 cm in short axis with maximum SUV 3.6,  concerning for malignant involvement. 3. Hypodense 1.1 cm right thyroid nodule has a maximum SUV substantially above that of the background thyroid. A significant minority of such nodules can harbor thyroid cancer. Recommend thyroid US and biopsy (ref: J Am Coll Radiol. 2015 Feb;12(2): 143-50). 4. 3 by 4 mm right lower lobe pulmonary nodule is similar to 05/05/2021, not hypermetabolic but below sensitive PET-CT size thresholds. Surveillance recommended. 5. Other imaging findings of potential clinical significance: Chronic left maxillary sinusitis. Aortic Atherosclerosis (ICD10-I70.0). Cholelithiasis. Prostatomegaly.   05/24/2021 - 06/13/2021 Chemotherapy          05/26/2021 Pathology Results   FINAL MICROSCOPIC DIAGNOSIS:   A. LYMPH NODE, 2R, FINE NEEDLE ASPIRATION:  - Malignant cells consistent with non-small cell carcinoma   COMMENT:  The cells are positive for cytokeratin 20 and CDX-2 (patchy). TTF-1, NapsinA, cytokeratin 5/6, p40, S100, MelanA, and cytokeratin 7 are negative. The patient's history of esophageal adenocarcinoma is noted and CDX-2 positivity is consistent with a gastrointestinal primary. This cytokeratin7/20 profile is usually seen in lower tract tumors, but this pattern can be seen in gastric/esophageal cases.   06/20/2021 -  Chemotherapy    Patient is on Treatment Plan: GASTROESOPHAGEAL FOLFOX Q14D X 6 CYCLES          INTERVAL HISTORY:  ELLWYN ERGLE is here for a follow up of esophageal cancer. He was last seen by me on 06/28/21. He presents to the clinic accompanied by his wife. He reports he is finally able to swallow better now.   All other systems were reviewed with the patient and are negative.  MEDICAL HISTORY:  Past Medical History:  Diagnosis  Date   BPH (benign prostatic hyperplasia)    Dyslipidemia    Elevated PSA    Esophageal cancer (HCC)    Full dentures    GERD (gastroesophageal reflux disease)    History of melanoma excision    07/ 2011   s/p  wide excision left back--- per pathology-- negative maligant --  mild melanocytic atypia   History of palpitations    cardiologist --  dr Shelva Majestic (last note in epic 2014   Hypertension    Sleep apnea     SURGICAL HISTORY: Past Surgical History:  Procedure Laterality Date   COLONOSCOPY  10/24/2007   ESOPHAGOGASTRODUODENOSCOPY (EGD) WITH PROPOFOL N/A 05/11/2021   Procedure: ESOPHAGOGASTRODUODENOSCOPY (EGD) WITH PROPOFOL;  Surgeon: Arta Silence, MD;  Location: [facility];  Service: Endoscopy;  Laterality: N/A;   EUS N/A 05/11/2021   Procedure: ESOPHAGEAL ENDOSCOPIC ULTRASOUND (EUS) RADIAL;  Surgeon: Arta Silence, MD;  Location: [facility];  Service: Endoscopy;  Laterality: N/A;   EYE SURGERY     Lasik surgery on both eyes, but still needs glasses   FINE NEEDLE ASPIRATION  05/26/2021   Procedure: FINE NEEDLE ASPIRATION (FNA) LINEAR;  Surgeon: Garner Nash, DO;  Location: [facility];  Service: Pulmonary;;   IR IMAGING GUIDED PORT INSERTION  06/30/2021   PROSTATE BIOPSY N/A 07/02/2015   Procedure: BIOPSY TRANSRECTAL ULTRASONIC PROSTATE (TUBP);  Surgeon: Lowella Bandy, MD;  Location[facility];  Service: Urology;  Laterality: N/A;   PROSTATE BIOPSY N/A 11/22/2015   Procedure: BIOPSY TRANSRECTAL ULTRASONIC PROSTATE (TUBP);  Surgeon: Cleon Gustin, MD;  Location[facility];  Service: Urology;  Laterality: N/A;   TRANSTHORACIC ECHOCARDIOGRAM  11/19/2012   normal echo/  trivial TR   VIDEO BRONCHOSCOPY WITH ENDOBRONCHIAL ULTRASOUND N/A 05/26/2021   Procedure: VIDEO BRONCHOSCOPY WITH ENDOBRONCHIAL ULTRASOUND;  Surgeon: Garner Nash, DO;  Location: [place];  Service: Pulmonary;  Laterality: N/A;   WIDE EXCISION MELANOMA LEFT BACK  05/02/2010    I have reviewed the social history and family history with the patient and they are unchanged from previous note.  ALLERGIES:  is allergic to bee venom.  MEDICATIONS:  Current Outpatient  Medications  Medication Sig Dispense Refill   aspirin EC 81 MG tablet Take 81 mg by mouth in the morning.     HYDROcodone-acetaminophen (HYCET) 7.5-325 mg/15 ml solution Take 5-10 mLs by mouth every 6 (six) hours as needed for moderate pain. 236 mL 0   lidocaine (XYLOCAINE) 2 % solution Patient: Mix 1part 2% viscous lidocaine, 1part H20. Swallow 58m of diluted mixture, 38m before meals and at bedtime, up to QID 200 mL 3   metoprolol succinate (TOPROL-XL) 100 MG 24 hr tablet Take 100 mg by mouth in the morning.     pantoprazole (PROTONIX) 40 MG tablet Take 1 tablet (40 mg total) by mouth 2 (two) times daily. 30 tablet 1   sucralfate (CARAFATE) 1 g tablet Dissolve 1 tablet in 10 mL H20 and swallow up to QID prn soreness. 40 tablet 5   No current facility-administered medications for this visit.    PHYSICAL EXAMINATION: ECOG PERFORMANCE STATUS: 1 - Symptomatic but completely ambulatory  Vitals:   07/05/21 1118  BP: 108/66  Pulse: 86  Resp: 18  Temp: 98 F (36.7 C)  SpO2: 99%   Wt Readings from Last 3 Encounters:  07/05/21 154 lb 3.2 oz (69.9 kg)  06/28/21 158 lb 14.4 oz (72.1 kg)  06/21/21 161 lb 4 oz (73.1 kg)  GENERAL:alert, no distress and comfortable SKIN: skin color normal, no rashes or significant lesions EYES: normal, Conjunctiva are pink and non-injected, sclera clear  NEURO: alert & oriented x 3 with fluent speech  LABORATORY DATA:  I have reviewed the data as listed CBC Latest Ref Rng & Units 07/05/2021 06/28/2021 06/20/2021  WBC 4.0 - 10.5 K/uL 3.1(L) 3.0(L) 1.6(L)  Hemoglobin 13.0 - 17.0 g/dL 11.1(L) 12.5(L) 12.3(L)  Hematocrit 39.0 - 52.0 % 30.7(L) 33.7(L) 34.1(L)  Platelets 150 - 400 K/uL 129(L) 138(L) 86(L)     CMP Latest Ref Rng & Units 07/05/2021 06/28/2021 06/20/2021  Glucose 70 - 99 mg/dL 162(H) 112(H) 94  BUN 8 - 23 mg/dL _0 Creatinine 0.61 - 1.24 mg/dL 0.78 0.83 0.79  Sodium 135 - 145 mmol/L 139 135 140  Potassium 3.5 - 5.1 mmol/L 3.3(L) 3.4(L)  3.8  Chloride 98 - 111 mmol/L 104 98 105  CO2 22 - 32 mmol/L _1 Calcium 8.9 - 10.3 mg/dL 9.3 9.4 9.4  Total Protein 6.5 - 8.1 g/dL 6.5 6.9 6.6  Total Bilirubin 0.3 - 1.2 mg/dL 0.7 0.8 0.7  Alkaline Phos 38 - 126 U/L 68 64 63  AST 15 - 41 U/L _2 ALT 0 - 44 U/L _3 RADIOGRAPHIC STUDIES: I have personally reviewed the radiological images as listed and agreed with the findings in the report. No results found.    No orders of the defined types were placed in this encounter.  All questions were answered. The patient knows to call the clinic with any problems, questions or concerns. No barriers to learning was detected. The total time spent in the appointment was 30 minutes.     [name], MD 07/05/2021   I, Wilburn Mylar, am acting as scribe for [name], MD.   I have reviewed the above documentation for accuracy and completeness, and I agree with the above.

## 2021-07-06 ENCOUNTER — Ambulatory Visit: Payer: BC Managed Care – PPO

## 2021-07-07 ENCOUNTER — Ambulatory Visit: Payer: BC Managed Care – PPO

## 2021-07-07 ENCOUNTER — Inpatient Hospital Stay: Payer: BC Managed Care – PPO

## 2021-07-07 ENCOUNTER — Other Ambulatory Visit: Payer: Self-pay

## 2021-07-07 VITALS — BP 119/76 | HR 83 | Temp 98.4°F | Resp 15

## 2021-07-07 DIAGNOSIS — C159 Malignant neoplasm of esophagus, unspecified: Secondary | ICD-10-CM | POA: Diagnosis not present

## 2021-07-07 DIAGNOSIS — I10 Essential (primary) hypertension: Secondary | ICD-10-CM | POA: Diagnosis not present

## 2021-07-07 DIAGNOSIS — Z95828 Presence of other vascular implants and grafts: Secondary | ICD-10-CM

## 2021-07-07 DIAGNOSIS — R002 Palpitations: Secondary | ICD-10-CM | POA: Diagnosis not present

## 2021-07-07 DIAGNOSIS — I959 Hypotension, unspecified: Secondary | ICD-10-CM | POA: Diagnosis not present

## 2021-07-07 MED ORDER — HEPARIN SOD (PORK) LOCK FLUSH 100 UNIT/ML IV SOLN: 500.0000 [IU] | Freq: Once | INTRAVENOUS | Status: DC

## 2021-07-07 MED ORDER — SODIUM CHLORIDE 0.9% FLUSH: 10.0000 mL | Freq: Once | INTRAVENOUS | Status: DC

## 2021-07-07 NOTE — Progress Notes (Signed)
Nathaniel Jennings called stating that [name]'s thyroid biopsy was to be rescheduled to the end of October.  Per Dr Ernestina Penna note from 07/05/2021 this was to be done.  I rescheduled the biopsy.  Appt is on 08/16/2021 arrive at 1415 for procedure at 1400.  [name] verbalized understanding.

## 2021-07-10 ENCOUNTER — Encounter: Payer: Self-pay | Admitting: Hematology

## 2021-07-10 ENCOUNTER — Other Ambulatory Visit: Payer: Self-pay | Admitting: Hematology

## 2021-07-11 ENCOUNTER — Encounter: Payer: Self-pay | Admitting: Hematology

## 2021-07-11 MED ORDER — HYDROCODONE-ACETAMINOPHEN 7.5-325 MG/15ML PO SOLN: 5.0000 mL | ORAL | 0 refills | Status: DC | PRN

## 2021-07-15 MED FILL — Dexamethasone Sodium Phosphate Inj 100 MG/10ML: INTRAMUSCULAR | Qty: 1 | Status: AC

## 2021-07-18 ENCOUNTER — Inpatient Hospital Stay: Payer: BC Managed Care – PPO

## 2021-07-18 ENCOUNTER — Inpatient Hospital Stay: Payer: BC Managed Care – PPO | Admitting: Nutrition

## 2021-07-18 ENCOUNTER — Other Ambulatory Visit: Payer: Self-pay

## 2021-07-18 ENCOUNTER — Inpatient Hospital Stay (HOSPITAL_BASED_OUTPATIENT_CLINIC_OR_DEPARTMENT_OTHER): Payer: BC Managed Care – PPO | Admitting: Hematology

## 2021-07-18 VITALS — BP 118/84 | HR 86 | Temp 98.1°F | Ht 66.0 in | Wt 150.5 lb

## 2021-07-18 DIAGNOSIS — Z5111 Encounter for antineoplastic chemotherapy: Secondary | ICD-10-CM | POA: Diagnosis not present

## 2021-07-18 DIAGNOSIS — E041 Nontoxic single thyroid nodule: Secondary | ICD-10-CM | POA: Diagnosis not present

## 2021-07-18 DIAGNOSIS — N400 Enlarged prostate without lower urinary tract symptoms: Secondary | ICD-10-CM | POA: Diagnosis not present

## 2021-07-18 DIAGNOSIS — K5900 Constipation, unspecified: Secondary | ICD-10-CM | POA: Diagnosis not present

## 2021-07-18 DIAGNOSIS — C158 Malignant neoplasm of overlapping sites of esophagus: Secondary | ICD-10-CM

## 2021-07-18 DIAGNOSIS — R1319 Other dysphagia: Secondary | ICD-10-CM | POA: Diagnosis not present

## 2021-07-18 DIAGNOSIS — Z79899 Other long term (current) drug therapy: Secondary | ICD-10-CM | POA: Diagnosis not present

## 2021-07-18 DIAGNOSIS — R634 Abnormal weight loss: Secondary | ICD-10-CM | POA: Diagnosis not present

## 2021-07-18 DIAGNOSIS — Z95828 Presence of other vascular implants and grafts: Secondary | ICD-10-CM | POA: Insufficient documentation

## 2021-07-18 DIAGNOSIS — I10 Essential (primary) hypertension: Secondary | ICD-10-CM | POA: Diagnosis not present

## 2021-07-18 LAB — CMP (CANCER CENTER ONLY)
ALT: 39 U/L (ref 0–44)
AST: 31 U/L (ref 15–41)
Albumin: 3.3 g/dL — ABNORMAL LOW (ref 3.5–5.0)
Alkaline Phosphatase: 77 U/L (ref 38–126)
Anion gap: 9 (ref 5–15)
BUN: 19 mg/dL (ref 8–23)
CO2: 27 mmol/L (ref 22–32)
Calcium: 9.5 mg/dL (ref 8.9–10.3)
Chloride: 103 mmol/L (ref 98–111)
Creatinine: 0.8 mg/dL (ref 0.61–1.24)
GFR, Estimated: >60 mL/min (ref >60)
Glucose, Bld: 91 mg/dL (ref 70–99)
Potassium: 4 mmol/L (ref 3.5–5.1)
Sodium: 139 mmol/L (ref 135–145)
Total Bilirubin: 0.6 mg/dL (ref 0.3–1.2)
Total Protein: 6.7 g/dL (ref 6.5–8.1)

## 2021-07-18 LAB — CBC WITH DIFFERENTIAL (CANCER CENTER ONLY)
Abs Immature Granulocytes: 0.04 10*3/uL (ref 0.00–0.07)
Basophils Absolute: 0 10*3/uL (ref 0.0–0.1)
Basophils Relative: 1 %
Eosinophils Absolute: 0.1 10*3/uL (ref 0.0–0.5)
Eosinophils Relative: 2 %
HCT: 29.7 % — ABNORMAL LOW (ref 39.0–52.0)
Hemoglobin: 10.6 g/dL — ABNORMAL LOW (ref 13.0–17.0)
Immature Granulocytes: 1 %
Lymphocytes Relative: 23 %
Lymphs Abs: 1.1 10*3/uL (ref 0.7–4.0)
MCH: 34.6 pg — ABNORMAL HIGH (ref 26.0–34.0)
MCHC: 35.7 g/dL (ref 30.0–36.0)
MCV: 97.1 fL (ref 80.0–100.0)
Monocytes Absolute: 0.8 10*3/uL (ref 0.1–1.0)
Monocytes Relative: 17 %
Neutro Abs: 2.7 10*3/uL (ref 1.7–7.7)
Neutrophils Relative %: 56 %
Platelet Count: 121 10*3/uL — ABNORMAL LOW (ref 150–400)
RBC: 3.06 MIL/uL — ABNORMAL LOW (ref 4.22–5.81)
RDW: 17.2 % — ABNORMAL HIGH (ref 11.5–15.5)
WBC Count: 4.8 10*3/uL (ref 4.0–10.5)
nRBC: 0 % (ref 0.0–0.2)

## 2021-07-18 MED ORDER — PALONOSETRON HCL INJECTION 0.25 MG/5ML
0.2500 mg | Freq: Once | INTRAVENOUS | Status: AC
  Administered 2021-07-18: 0.25 mg via INTRAVENOUS
  Filled 2021-07-18: qty 5

## 2021-07-18 MED ORDER — SODIUM CHLORIDE 0.9 % IV SOLN
10.0000 mg | Freq: Once | INTRAVENOUS | Status: AC
  Administered 2021-07-18: 10 mg via INTRAVENOUS
  Filled 2021-07-18: qty 10

## 2021-07-18 MED ORDER — SODIUM CHLORIDE 0.9% FLUSH
10.0000 mL | Freq: Once | INTRAVENOUS | Status: AC
  Administered 2021-07-18: 10 mL

## 2021-07-18 MED ORDER — DEXTROSE 5 % IV SOLN: Freq: Once | INTRAVENOUS | Status: AC

## 2021-07-18 MED ORDER — OXALIPLATIN CHEMO INJECTION 100 MG/20ML
85.0000 mg/m2 | Freq: Once | INTRAVENOUS | Status: AC
  Administered 2021-07-18: 160 mg via INTRAVENOUS
  Filled 2021-07-18: qty 32

## 2021-07-18 MED ORDER — SODIUM CHLORIDE 0.9 % IV SOLN
2400.0000 mg/m2 | INTRAVENOUS | Status: DC
  Administered 2021-07-18: 4500 mg via INTRAVENOUS
  Filled 2021-07-18: qty 90

## 2021-07-18 MED ORDER — LEUCOVORIN CALCIUM INJECTION 350 MG
400.0000 mg/m2 | Freq: Once | INTRAVENOUS | Status: AC
  Administered 2021-07-18: 748 mg via INTRAVENOUS
  Filled 2021-07-18: qty 37.4

## 2021-07-18 NOTE — Progress Notes (Signed)
[place]   [phone] Fax:([phone]) [phone]   Clinic Follow up Note   Patient Care Team: Cyndi Bender, Hershal Coria as PCP - General (Physician Assistant) [name], MD as Consulting Physician (Oncology)  Date of Service:  07/18/2021  CHIEF COMPLAINT: f/u of esophageal cancer  CURRENT THERAPY:  FOLFOX, dose reduced due to neutropenia, starting 06/20/21  ASSESSMENT & PLAN:  Nathaniel Jennings is a 65 y.o. male with   1. 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 -EGD performed by Dr. Alessandra Bevels on 04/19/21 showed a partially obstructing, likely malignant, esophageal tumor in the distal esophagus and a polyp in the mid esophagus. Biopsy of both confirmed invasive moderately differentiated adenocarcinoma of the esophagus, Her2 negative. -CT CAP on 05/05/21 showing: esophageal mass not well visualized; indeterminate mildly-enlarged high right paratracheal node near thoracic inlet; no other mediastinal adenopathy or evidence of metastatic disease in abdomen or pelvis; stable small pulmonary nodules bilaterally. -EUS 05/11/21 staged this as T3 N2 Mx. -PET scan 05/16/21 showed: 6.2 cm distal esophageal mass; hypermetabolic upper right paratracheal lymph node.  No other distant metastasis.  -EBUS biopsy on 05/26/21, path from 2R lymph node confirmed malignant cells consistent with his primary esophageal cancer. This node is not resectable by surgery  -He began concurrent chemoRT with weekly TC on 05/24/21. He completed radiation therapy on 07/04/21. -We changed his chemo to FOLFOX on 06/20/21, at reduced dose for first cycle due to mild neutropenia and concurrent RT. Port placed 06/30/21. -labs reviewed, adequate to proceed with C3 FOLFOX. We will plan for one more cycle prior to his PET scan, with another planned if needed  -scheduled for  restaging PET on 10/13 and f/u with Dr. Kipp Brood on 10/14 in anticipation of proceeding with surgery in late 07/2021.   2. Severe dysphagia and odynophagia with weight loss -He lost about 15 lbs from April to July 2022. -He had dysphagia prior to treatment, but he has developed burning with swallowing, secondary to radiation therapy, Carafate is not helpful. -He is supplementing with Boost, 3 a day. He continues to lose weight. -He reports his dysphagia is finally improving.   3. Hypermetabolic thyroid nodule -seen on PET 05/16/21 (and not on head/neck US on 04/18/21) -repeat head/neck US on 06/03/21 showed a 1.1 cm inferior right nodule, corresponding to hypermetabolic focus on PET.  -RN Santiago Glad rescheduled his thyroid biopsy to 08/16/21.   4. HTN and BPH -continue medication, will monitor his blood pressure closely during treatment.     PLAN:  -proceed with C3 FOLFOX today at full dose (no 5-fu bolus) -labs, f/u, and C4 in 2 weeks -PET scan on 08/04/21   No problem-specific Assessment & Plan notes found for this encounter.   SUMMARY OF ONCOLOGIC HISTORY: Oncology History  Malignant neoplasm of overlapping sites of esophagus ([place])  04/18/2021 Imaging   ULTRASOUND OF HEAD/NECK SOFT TISSUES  IMPRESSION: No suspicious lymphadenopathy   04/19/2021 Procedure   EGD  Impression: - Esophageal polyp(s) were found. Biopsied. - Partially obstructing, likely malignant esophageal tumor was found in the distal esophagus. - Small hiatal hernia. - A single gastric polyp. Biopsied. - Normal duodenal bulb, first portion of the duodenum and second portion of the duodenum.   04/19/2021 Pathology Results   FINAL MICROSCOPIC DIAGNOSIS: Stomach, Biopsy - FUNDIC GLAND POLYP. Negative for dysplasia. NO Helicobacter pylori organisms seen on H and E  stain. Esophagus- Distal, Biopsy - INVASIVE MODERATELY DIFFERENTIATED ADENOCARCINOMA OF THE ESOPHAGUS.  - HER2 Study by Immunostain: Negative (score  1+) Esophagus- Mid, Biopsy - INVASIVE MODERATELY DIFFERENTIATED ADENOCARCINOMA OF THE ESOPHAGUS. - HER2 Study by Immunostain: Negative (score 1+)   05/05/2021 Imaging   CT CAP  IMPRESSION: 1. The patient's known esophageal mass is not well visualized. There is mild irregular wall thickening of the distal esophagus. Correlate with previous clinical evaluation. 2. Indeterminate mildly enlarged high right paratracheal node near the thoracic inlet. No other mediastinal adenopathy. 3. No evidence of metastatic disease in the abdomen or pelvis. 4. Stable small pulmonary nodules bilaterally consistent with benign findings. 5. Cholelithiasis, small renal cysts and Aortic Atherosclerosis (ICD10-I70.0).   05/09/2021 Initial Diagnosis   Malignant neoplasm of overlapping sites of esophagus ([place])   05/11/2021 Procedure   Upper EUS  Impression: - Mucosal nodule found in the esophagus. - Partially obstructing, malignant esophageal tumor was found at the gastroesophageal junction. - At least 3 discrete peritumoral lymph nodes - A mass was found in the gastroesophageal junction. A tissue diagnosis was obtained prior to this exam. This is of adenocarcinoma. This was staged T3 N2 Mx by endosonographic criteria. - Unable to traverse GE junction with either EGD scope or radial EUS scope.   05/13/2021 Cancer Staging   Staging form: Esophagus - Adenocarcinoma, AJCC 8th Edition - Clinical stage from 05/13/2021: Stage IVA (cT3, cN2, cM0) - Signed by [name], MD on 05/22/2021   05/16/2021 PET scan   IMPRESSION: 1. Distal esophageal mass measuring about 6.2 cm in length, maximum SUV 13.1. 2. Mildly hypermetabolic and mildly enlarged upper right paratracheal lymph node, 1.3 cm in short axis with maximum SUV 3.6, concerning for malignant involvement. 3. Hypodense 1.1 cm right thyroid nodule has a maximum SUV substantially above that of the background thyroid. A significant minority of such nodules can harbor  thyroid cancer. Recommend thyroid US and biopsy (ref: J Am Coll Radiol. 2015 Feb;12(2): 143-50). 4. 3 by 4 mm right lower lobe pulmonary nodule is similar to 05/05/2021, not hypermetabolic but below sensitive PET-CT size thresholds. Surveillance recommended. 5. Other imaging findings of potential clinical significance: Chronic left maxillary sinusitis. Aortic Atherosclerosis (ICD10-I70.0). Cholelithiasis. Prostatomegaly.   05/24/2021 - 06/13/2021 Chemotherapy          05/26/2021 Pathology Results   FINAL MICROSCOPIC DIAGNOSIS:   A. LYMPH NODE, 2R, FINE NEEDLE ASPIRATION:  - Malignant cells consistent with non-small cell carcinoma   COMMENT:  The cells are positive for cytokeratin 20 and CDX-2 (patchy). TTF-1, NapsinA, cytokeratin 5/6, p40, S100, MelanA, and cytokeratin 7 are negative. The patient's history of esophageal adenocarcinoma is noted and CDX-2 positivity is consistent with a gastrointestinal primary. This cytokeratin7/20 profile is usually seen in lower tract tumors, but this pattern can be seen in gastric/esophageal cases.   06/20/2021 -  Chemotherapy   Patient is on Treatment Plan : GASTROESOPHAGEAL FOLFOX q14d x 6 cycles        INTERVAL HISTORY:  Nathaniel Jennings is here for a follow up of esophageal cancer. He was last seen by me on 07/05/21. He presents to the clinic accompanied by his wife. He reports the pain they had contacted [place] for (see 07/10/21 MyChart message) has greatly improved as of Saturday (07/16/21). He notes the pain gets up to only a 2/10 now. His wife notes he was having very low BP last week as well. She reports he was seen by his PCP, who halved his metoprolol.  All other systems were reviewed with the patient and are negative.  MEDICAL HISTORY:  Past Medical History:  Diagnosis Date   BPH (benign prostatic hyperplasia)    Dyslipidemia    Elevated PSA    Esophageal cancer ([place])    Full dentures    GERD (gastroesophageal reflux disease)    History  of melanoma excision    07/ 2011  s/p  wide excision left back--- per pathology-- negative maligant --  mild melanocytic atypia   History of palpitations    cardiologist --  dr Shelva Majestic (last note in epic 2014   Hypertension    Sleep apnea     SURGICAL HISTORY: Past Surgical History:  Procedure Laterality Date   COLONOSCOPY  10/24/2007   ESOPHAGOGASTRODUODENOSCOPY (EGD) WITH PROPOFOL N/A 05/11/2021   Procedure: ESOPHAGOGASTRODUODENOSCOPY (EGD) WITH PROPOFOL;  Surgeon: Arta Silence, MD;  Location: [facility];  Service: Endoscopy;  Laterality: N/A;   EUS N/A 05/11/2021   Procedure: ESOPHAGEAL ENDOSCOPIC ULTRASOUND (EUS) RADIAL;  Surgeon: Arta Silence, MD;  Location: [facility];  Service: Endoscopy;  Laterality: N/A;   EYE SURGERY     Lasik surgery on both eyes, but still needs glasses   FINE NEEDLE ASPIRATION  05/26/2021   Procedure: FINE NEEDLE ASPIRATION (FNA) LINEAR;  Surgeon: Garner Nash, DO;  Location: [facility];  Service: Pulmonary;;   IR IMAGING GUIDED PORT INSERTION  06/30/2021   PROSTATE BIOPSY N/A 07/02/2015   Procedure: BIOPSY TRANSRECTAL ULTRASONIC PROSTATE (TUBP);  Surgeon: Lowella Bandy, MD;  Location: [facility];  Service: Urology;  Laterality: N/A;   PROSTATE BIOPSY N/A 11/22/2015   Procedure: BIOPSY TRANSRECTAL ULTRASONIC PROSTATE (TUBP);  Surgeon: Cleon Gustin, MD;  Location: [facility];  Service: Urology;  Laterality: N/A;   TRANSTHORACIC ECHOCARDIOGRAM  11/19/2012   normal echo/  trivial TR   VIDEO BRONCHOSCOPY WITH ENDOBRONCHIAL ULTRASOUND N/A 05/26/2021   Procedure: VIDEO BRONCHOSCOPY WITH ENDOBRONCHIAL ULTRASOUND;  Surgeon: Garner Nash, DO;  Location: [facility];  Service: Pulmonary;  Laterality: N/A;   WIDE EXCISION MELANOMA LEFT BACK  05/02/2010    I have reviewed the social history and family history with the patient and they are unchanged from previous note.  ALLERGIES:  is allergic to bee  venom.  MEDICATIONS:  Current Outpatient Medications  Medication Sig Dispense Refill   aspirin EC 81 MG tablet Take 81 mg by mouth in the morning.     HYDROcodone-acetaminophen (HYCET) 7.5-325 mg/15 ml solution Take 5-10 mLs by mouth every 4 (four) hours as needed for moderate pain. 473 mL 0   lidocaine (XYLOCAINE) 2 % solution Patient: Mix 1part 2% viscous lidocaine, 1part H20. Swallow 36m of diluted mixture, 372m before meals and at bedtime, up to QID 200 mL 3   metoprolol succinate (TOPROL-XL) 100 MG 24 hr tablet Take 50 mg by mouth in the morning.     ondansetron (ZOFRAN) 8 MG tablet Take by mouth.     pantoprazole (PROTONIX) 40 MG tablet Take 1 tablet (40 mg total) by mouth 2 (two) times daily. 30 tablet 1   sucralfate (CARAFATE) 1 g tablet Dissolve 1 tablet in 10 mL H20 and swallow up to QID prn soreness. 40 tablet 5   No current facility-administered medications for this visit.    PHYSICAL EXAMINATION: ECOG PERFORMANCE STATUS: 2 - Symptomatic, <50% confined to bed  Vitals:   07/18/21 1113  BP: 118/84  Pulse: 86  Temp: 98.1 F (36.7 C)  SpO2: 100%   Wt  Readings from Last 3 Encounters:  07/18/21 150 lb 8 oz (68.3 kg)  07/05/21 154 lb 3.2 oz (69.9 kg)  06/28/21 158 lb 14.4 oz (72.1 kg)    GENERAL:alert, no distress and comfortable SKIN: skin color normal, no rashes or significant lesions EYES: normal, Conjunctiva are pink and non-injected, sclera clear  OROPHARYNX: no erythema, lips and tongue normal NEURO: alert & oriented x 3 with fluent speech  LABORATORY DATA:  I have reviewed the data as listed CBC Latest Ref Rng & Units 07/18/2021 07/05/2021 06/28/2021  WBC 4.0 - 10.5 K/uL 4.8 3.1(L) 3.0(L)  Hemoglobin 13.0 - 17.0 g/dL 10.6(L) 11.1(L) 12.5(L)  Hematocrit 39.0 - 52.0 % 29.7(L) 30.7(L) 33.7(L)  Platelets 150 - 400 K/uL 121(L) 129(L) 138(L)     CMP Latest Ref Rng & Units 07/18/2021 07/05/2021 06/28/2021  Glucose 70 - 99 mg/dL 91 162(H) 112(H)  BUN 8 - 23 mg/dL _0 Creatinine 0.61 - 1.24 mg/dL 0.80 0.78 0.83  Sodium 135 - 145 mmol/L 139 139 135  Potassium 3.5 - 5.1 mmol/L 4.0 3.3(L) 3.4(L)  Chloride 98 - 111 mmol/L 103 104 98  CO2 22 - 32 mmol/L _1 Calcium 8.9 - 10.3 mg/dL 9.5 9.3 9.4  Total Protein 6.5 - 8.1 g/dL 6.7 6.5 6.9  Total Bilirubin 0.3 - 1.2 mg/dL 0.6 0.7 0.8  Alkaline Phos 38 - 126 U/L 77 68 64  AST 15 - 41 U/L _2 ALT 0 - 44 U/L 39 25 24      RADIOGRAPHIC STUDIES: I have personally reviewed the radiological images as listed and agreed with the findings in the report. No results found.    No orders of the defined types were placed in this encounter.  All questions were answered. The patient knows to call the clinic with any problems, questions or concerns. No barriers to learning was detected. The total time spent in the appointment was 30 minutes.     [name], MD 07/18/2021   I, Wilburn Mylar, am acting as scribe for [name], MD.   I have reviewed the above documentation for accuracy and completeness, and I agree with the above.

## 2021-07-18 NOTE — Progress Notes (Signed)
Patient was sleeping soundly during nutrition follow-up for esophageal cancer. Patient did arouse to answer some questions but kept eyes closed.  He reports he is doing okay.  States he is drinking oral nutrition supplements.  He denies nutrition impact symptoms.  Weight continues to drop and was documented as 150.5 pounds September 26.  Noted albumin 3.3.  Nutrition diagnosis: Unintended weight loss continues.  Intervention: Encourage patient to increase oral intake as well as oral nutrition supplements to minimize further weight loss. Encourage patient to contact RD with questions.  Monitoring, evaluation, goals: Will follow for weight trends and nutrition impact symptoms.  Next visit: No follow-up scheduled however patient does have RD contact information.  Please refer back to RD if nutrition problems identified.  **Disclaimer: This note was dictated with voice recognition software. Similar sounding words can inadvertently be transcribed and this note may contain transcription errors which may not have been corrected upon publication of note.**

## 2021-07-18 NOTE — Patient Instructions (Signed)
[facility] ONCOLOGY  Discharge Instructions: Thank you for choosing [facility] to provide your oncology and hematology care.   If you have a lab appointment with the [facility], please go directly to the [facility] and check in at the registration area.   Wear comfortable clothing and clothing appropriate for easy access to any Portacath or PICC line.   We strive to give you quality time with your provider. You may need to reschedule your appointment if you arrive late (15 or more minutes).  Arriving late affects you and other patients whose appointments are after yours.  Also, if you miss three or more appointments without notifying the office, you may be dismissed from the clinic at the provider's discretion.      For prescription refill requests, have your pharmacy contact our office and allow 72 hours for refills to be completed.    Today you received the following chemotherapy and/or immunotherapy agents FOLFOX      To help prevent nausea and vomiting after your treatment, we encourage you to take your nausea medication as directed.  BELOW ARE SYMPTOMS THAT SHOULD BE REPORTED IMMEDIATELY: *FEVER GREATER THAN 100.4 F (38 C) OR HIGHER *CHILLS OR SWEATING *NAUSEA AND VOMITING THAT IS NOT CONTROLLED WITH YOUR NAUSEA MEDICATION *UNUSUAL SHORTNESS OF BREATH *UNUSUAL BRUISING OR BLEEDING *URINARY PROBLEMS (pain or burning when urinating, or frequent urination) *BOWEL PROBLEMS (unusual diarrhea, constipation, pain near the anus) TENDERNESS IN MOUTH AND THROAT WITH OR WITHOUT PRESENCE OF ULCERS (sore throat, sores in mouth, or a toothache) UNUSUAL RASH, SWELLING OR PAIN  UNUSUAL VAGINAL DISCHARGE OR ITCHING   Items with * indicate a potential emergency and should be followed up as soon as possible or go to the Emergency Department if any problems should occur.  Please show the CHEMOTHERAPY ALERT CARD or IMMUNOTHERAPY ALERT CARD at check-in to the  Emergency Department and triage nurse.  Should you have questions after your visit or need to cancel or reschedule your appointment, please contact [facility]  Dept: [phone]  and follow the prompts.  Office hours are 8:00 a.m. to 4:30 p.m. Monday - Friday. Please note that voicemails left after 4:00 p.m. may not be returned until the following business day.  We are closed weekends and major holidays. You have access to a nurse at all times for urgent questions. Please call the main number to the clinic Dept: [phone] and follow the prompts.   For any non-urgent questions, you may also contact your provider using MyChart. We now offer e-Visits for anyone 47 and older to request care online for non-urgent symptoms. For details visit [email or web address].   Also download the MyChart app! Go to the app store, search "MyChart", open the app, select [facility], and log in with your MyChart username and password.  Due to Covid, a mask is required upon entering the hospital/clinic. If you do not have a mask, one will be given to you upon arrival. For doctor visits, patients may have 1 support person aged 83 or older with them. For treatment visits, patients cannot have anyone with them due to current Covid guidelines and our immunocompromised population.

## 2021-07-19 ENCOUNTER — Other Ambulatory Visit: Payer: BC Managed Care – PPO

## 2021-07-20 ENCOUNTER — Inpatient Hospital Stay: Payer: BC Managed Care – PPO

## 2021-07-20 ENCOUNTER — Other Ambulatory Visit: Payer: Self-pay

## 2021-07-20 VITALS — BP 112/80 | HR 88 | Temp 98.7°F | Resp 15

## 2021-07-20 DIAGNOSIS — Z79899 Other long term (current) drug therapy: Secondary | ICD-10-CM | POA: Diagnosis not present

## 2021-07-20 DIAGNOSIS — Z95828 Presence of other vascular implants and grafts: Secondary | ICD-10-CM

## 2021-07-20 DIAGNOSIS — N400 Enlarged prostate without lower urinary tract symptoms: Secondary | ICD-10-CM | POA: Diagnosis not present

## 2021-07-20 DIAGNOSIS — E041 Nontoxic single thyroid nodule: Secondary | ICD-10-CM | POA: Diagnosis not present

## 2021-07-20 DIAGNOSIS — R634 Abnormal weight loss: Secondary | ICD-10-CM | POA: Diagnosis not present

## 2021-07-20 DIAGNOSIS — K5900 Constipation, unspecified: Secondary | ICD-10-CM | POA: Diagnosis not present

## 2021-07-20 DIAGNOSIS — C158 Malignant neoplasm of overlapping sites of esophagus: Secondary | ICD-10-CM | POA: Diagnosis not present

## 2021-07-20 DIAGNOSIS — I10 Essential (primary) hypertension: Secondary | ICD-10-CM | POA: Diagnosis not present

## 2021-07-20 DIAGNOSIS — R1319 Other dysphagia: Secondary | ICD-10-CM | POA: Diagnosis not present

## 2021-07-20 DIAGNOSIS — Z5111 Encounter for antineoplastic chemotherapy: Secondary | ICD-10-CM | POA: Diagnosis not present

## 2021-07-20 MED ORDER — SODIUM CHLORIDE 0.9% FLUSH
10.0000 mL | Freq: Once | INTRAVENOUS | Status: AC
  Administered 2021-07-20: 10 mL

## 2021-07-20 MED ORDER — HEPARIN SOD (PORK) LOCK FLUSH 100 UNIT/ML IV SOLN
500.0000 [IU] | Freq: Once | INTRAVENOUS | Status: AC
  Administered 2021-07-20: 500 [IU]

## 2021-07-20 NOTE — Progress Notes (Signed)
[name] called stating that Nathaniel Jennings's PET scan is scheduled during his 44 hrs of 5-FU.  This PET scan was ordered by Dr Kipp Brood.  Forwarded to [employer].

## 2021-07-22 DIAGNOSIS — C158 Malignant neoplasm of overlapping sites of esophagus: Secondary | ICD-10-CM | POA: Diagnosis not present

## 2021-07-30 DIAGNOSIS — G4730 Sleep apnea, unspecified: Secondary | ICD-10-CM | POA: Diagnosis not present

## 2021-07-31 ENCOUNTER — Encounter: Payer: Self-pay | Admitting: Hematology

## 2021-08-02 ENCOUNTER — Other Ambulatory Visit: Payer: BC Managed Care – PPO

## 2021-08-02 ENCOUNTER — Ambulatory Visit: Payer: BC Managed Care – PPO

## 2021-08-02 ENCOUNTER — Ambulatory Visit: Payer: BC Managed Care – PPO | Admitting: Nurse Practitioner

## 2021-08-04 ENCOUNTER — Ambulatory Visit (HOSPITAL_COMMUNITY)
Admission: RE | Admit: 2021-08-04 | Discharge: 2021-08-04 | Disposition: A | Discharge status: Home / Self Care | Payer: BC Managed Care – PPO | Source: Ambulatory Visit | Attending: Thoracic Surgery (Cardiothoracic Vascular Surgery) | Admitting: Thoracic Surgery (Cardiothoracic Vascular Surgery)

## 2021-08-04 DIAGNOSIS — K8020 Calculus of gallbladder without cholecystitis without obstruction: Secondary | ICD-10-CM | POA: Diagnosis not present

## 2021-08-04 DIAGNOSIS — C158 Malignant neoplasm of overlapping sites of esophagus: Secondary | ICD-10-CM | POA: Diagnosis not present

## 2021-08-04 DIAGNOSIS — C159 Malignant neoplasm of esophagus, unspecified: Secondary | ICD-10-CM | POA: Diagnosis not present

## 2021-08-04 DIAGNOSIS — E041 Nontoxic single thyroid nodule: Secondary | ICD-10-CM | POA: Diagnosis not present

## 2021-08-04 DIAGNOSIS — J439 Emphysema, unspecified: Secondary | ICD-10-CM | POA: Diagnosis not present

## 2021-08-04 LAB — GLUCOSE, CAPILLARY: Glucose-Capillary: 103 mg/dL — ABNORMAL HIGH (ref 70–99)

## 2021-08-04 MED ORDER — FLUDEOXYGLUCOSE F - 18 (FDG) INJECTION
7.8000 | Freq: Once | INTRAVENOUS | Status: AC | PRN
  Administered 2021-08-04: 7.4 via INTRAVENOUS

## 2021-08-04 NOTE — Progress Notes (Addendum)
Nathaniel Jennings   [phone] [phone]   Clinic Follow up Note   Patient Care Team: Cyndi Bender, Hershal Coria as PCP - General (Physician Assistant) [name], MD as Consulting Physician (Oncology) 08/08/2021  CHIEF COMPLAINT: Follow-up esophageal cancer  SUMMARY OF ONCOLOGIC HISTORY: Oncology History  Malignant neoplasm of overlapping sites of esophagus ([place])  04/18/2021 Imaging   ULTRASOUND OF HEAD/NECK SOFT TISSUES  IMPRESSION: No suspicious lymphadenopathy   04/19/2021 Procedure   EGD  Impression: - Esophageal polyp(s) were found. Biopsied. - Partially obstructing, likely malignant esophageal tumor was found in the distal esophagus. - Small hiatal hernia. - A single gastric polyp. Biopsied. - Normal duodenal bulb, first portion of the duodenum and second portion of the duodenum.   04/19/2021 Pathology Results   FINAL MICROSCOPIC DIAGNOSIS: Stomach, Biopsy - FUNDIC GLAND POLYP. Negative for dysplasia. NO Helicobacter pylori organisms seen on H and E stain. Esophagus- Distal, Biopsy - INVASIVE MODERATELY DIFFERENTIATED ADENOCARCINOMA OF THE ESOPHAGUS.  - HER2 Study by Immunostain: Negative (score 1+) Esophagus- Mid, Biopsy - INVASIVE MODERATELY DIFFERENTIATED ADENOCARCINOMA OF THE ESOPHAGUS. - HER2 Study by Immunostain: Negative (score 1+)   05/05/2021 Imaging   CT CAP  IMPRESSION: 1. The patient's known esophageal mass is not well visualized. There is mild irregular wall thickening of the distal esophagus. Correlate with previous clinical evaluation. 2. Indeterminate mildly enlarged high right paratracheal node near the thoracic inlet. No other mediastinal adenopathy. 3. No evidence of metastatic disease in the abdomen or pelvis. 4. Stable small pulmonary nodules bilaterally consistent with benign findings. 5. Cholelithiasis, small renal cysts and Aortic Atherosclerosis (ICD10-I70.0).   05/09/2021 Initial Diagnosis   Malignant neoplasm  of overlapping sites of esophagus ([place])   05/11/2021 Procedure   Upper EUS  Impression: - Mucosal nodule found in the esophagus. - Partially obstructing, malignant esophageal tumor was found at the gastroesophageal junction. - At least 3 discrete peritumoral lymph nodes - A mass was found in the gastroesophageal junction. A tissue diagnosis was obtained prior to this exam. This is of adenocarcinoma. This was staged T3 N2 Mx by endosonographic criteria. - Unable to traverse GE junction with either EGD scope or radial EUS scope.   05/13/2021 Cancer Staging   Staging form: Esophagus - Adenocarcinoma, AJCC 8th Edition - Clinical stage from 05/13/2021: Stage IVA (cT3, cN2, cM0) - Signed by [name], MD on 05/22/2021   05/16/2021 PET scan   IMPRESSION: 1. Distal esophageal mass measuring about 6.2 cm in length, maximum SUV 13.1. 2. Mildly hypermetabolic and mildly enlarged upper right paratracheal lymph node, 1.3 cm in short axis with maximum SUV 3.6, concerning for malignant involvement. 3. Hypodense 1.1 cm right thyroid nodule has a maximum SUV substantially above that of the background thyroid. A significant minority of such nodules can harbor thyroid cancer. Recommend thyroid US and biopsy (ref: J Am Coll Radiol. 2015 Feb;12(2): 143-50). 4. 3 by 4 mm right lower lobe pulmonary nodule is similar to 05/05/2021, not hypermetabolic but below sensitive PET-CT size thresholds. Surveillance recommended. 5. Other imaging findings of potential clinical significance: Chronic left maxillary sinusitis. Aortic Atherosclerosis (ICD10-I70.0). Cholelithiasis. Prostatomegaly.   05/24/2021 - 06/13/2021 Chemotherapy          05/26/2021 Pathology Results   FINAL MICROSCOPIC DIAGNOSIS:   A. LYMPH NODE, 2R, FINE NEEDLE ASPIRATION:  - Malignant cells consistent with non-small cell carcinoma   COMMENT:  The cells are positive for cytokeratin 20 and CDX-2 (patchy). TTF-1, NapsinA, cytokeratin 5/6, p40,  S100, MelanA, and cytokeratin  7 are negative. The patient's history of esophageal adenocarcinoma is noted and CDX-2 positivity is consistent with a gastrointestinal primary. This cytokeratin7/20 profile is usually seen in lower tract tumors, but this pattern can be seen in gastric/esophageal cases.   05/27/2021 - 07/04/2021 Radiation Therapy   Per Dr. Isidore Moos   06/20/2021 -  Chemotherapy   Patient is on Treatment Plan : GASTROESOPHAGEAL FOLFOX q14d x 6 cycles     08/04/2021 PET scan   IMPRESSION: 1. Interval development of a new 6 mm short axis right cervical node with hypermetabolism. Finding is somewhat indeterminate given the apparent response of the hypermetabolic right paratracheal lymphadenopathy seen on the previous study. Close follow-up recommended as metastatic disease not excluded. 2. Diffuse hypermetabolic FDG accumulation noted in the esophagus today with more diffuse circumferential esophageal wall thickening on today's study than on the previous exam. Hypermetabolism in the distal esophagus at the level of the hypermetabolic neoplasm previously has decreased from SUV max = 13 to SUV max = 7 today. 3. Stable tiny 3-4 mm right lower lobe and left upper lobe pulmonary nodules. Continued attention on follow-up recommended. 4. Cholelithiasis. 5. Emphysema.  (ICD10-J43.9) 6.  Aortic Atherosclerois (ICD10-170.0)     CURRENT THERAPY: FOLFOX every 2 weeks, dose reduced for neutropenia, starting 06/20/2021  INTERVAL HISTORY: Nathaniel Jennings returns for follow up and treatment as scheduled.  Last seen by Dr. Burr Medico 07/18/2021.  He had a PET scan on 10/13 and saw Dr. Kipp Brood on 10/14.  He sent a message via Mychart on 07/31/2021 about dysphagia I recommended to restart Carafate and Hycet because lidocaine alone was insufficient.  Today he notes his dysphagia has improved, he can swallow but it is uncomfortable he attributes more to the discomfort from his thyroid.  Swallowing is better now than it was  3-4 weeks ago.  He continues Carafate as needed.  He can eat mostly what ever he wants.  Approximately 1-1.5 weeks ago he developed pressure behind his right ear with clear drainage.  This has occurred intermittently "all my life" he has had tubes many times.  He has mild nasal congestion, and blue some blood/clots at his nose only in the mornings for a few days after last chemo.  Denies sore throat, fever, chills, new cough, chest pain.  He had an episode of exertional dyspnea once last week after he was very active but none recently.  Cold sensitivity lasts 1 to 1.5 weeks after treatment.  For 1-2 days after pump DC his fingers contract "like a cramp".  This is not related to cold exposure.  Denies residual numbness or tingling.  Mild nausea is managed with Zofran and Compazine at home.  No vomiting.  Bowels moving normally after some constipation after treatment.   MEDICAL HISTORY:  Past Medical History:  Diagnosis Date   BPH (benign prostatic hyperplasia)    Dyslipidemia    Elevated PSA    Esophageal cancer ([place])    Full dentures    GERD (gastroesophageal reflux disease)    History of melanoma excision    07/ 2011  s/p  wide excision left back--- per pathology-- negative maligant --  mild melanocytic atypia   History of palpitations    cardiologist --  dr Shelva Majestic (last note in epic 2014   Hypertension    Sleep apnea     SURGICAL HISTORY: Past Surgical History:  Procedure Laterality Date   COLONOSCOPY  10/24/2007   ESOPHAGOGASTRODUODENOSCOPY (EGD) WITH PROPOFOL N/A 05/11/2021   Procedure: ESOPHAGOGASTRODUODENOSCOPY (EGD)  WITH PROPOFOL;  Surgeon: Arta Silence, MD;  Location: [facility];  Service: Endoscopy;  Laterality: N/A;   EUS N/A 05/11/2021   Procedure: ESOPHAGEAL ENDOSCOPIC ULTRASOUND (EUS) RADIAL;  Surgeon: Arta Silence, MD;  Location: [facility];  Service: Endoscopy;  Laterality: N/A;   EYE SURGERY     Lasik surgery on both eyes, but still needs glasses   FINE  NEEDLE ASPIRATION  05/26/2021   Procedure: FINE NEEDLE ASPIRATION (FNA) LINEAR;  Surgeon: Garner Nash, DO;  Location: [facility];  Service: Pulmonary;;   IR IMAGING GUIDED PORT INSERTION  06/30/2021   PROSTATE BIOPSY N/A 07/02/2015   Procedure: BIOPSY TRANSRECTAL ULTRASONIC PROSTATE (TUBP);  Surgeon: Lowella Bandy, MD;  Location: [facility];  Service: Urology;  Laterality: N/A;   PROSTATE BIOPSY N/A 11/22/2015   Procedure: BIOPSY TRANSRECTAL ULTRASONIC PROSTATE (TUBP);  Surgeon: Cleon Gustin, MD;  Location: [facility];  Service: Urology;  Laterality: N/A;   TRANSTHORACIC ECHOCARDIOGRAM  11/19/2012   normal echo/  trivial TR   VIDEO BRONCHOSCOPY WITH ENDOBRONCHIAL ULTRASOUND N/A 05/26/2021   Procedure: VIDEO BRONCHOSCOPY WITH ENDOBRONCHIAL ULTRASOUND;  Surgeon: Garner Nash, DO;  Location: [facility];  Service: Pulmonary;  Laterality: N/A;   WIDE EXCISION MELANOMA LEFT BACK  05/02/2010    I have reviewed the social history and family history with the patient and they are unchanged from previous note.  ALLERGIES:  is allergic to bee venom.  MEDICATIONS:  Current Outpatient Medications  Medication Sig Dispense Refill   aspirin EC 81 MG tablet Take 81 mg by mouth in the morning.     metoprolol succinate (TOPROL-XL) 100 MG 24 hr tablet Take 50 mg by mouth in the morning.     pantoprazole (PROTONIX) 40 MG tablet Take 1 tablet (40 mg total) by mouth 2 (two) times daily. 30 tablet 1   sucralfate (CARAFATE) 1 g tablet Dissolve 1 tablet in 10 mL H20 and swallow up to QID prn soreness. 40 tablet 5   HYDROcodone-acetaminophen (HYCET) 7.5-325 mg/15 ml solution Take 5-10 mLs by mouth every 4 (four) hours as needed for moderate pain. 473 mL 0   lidocaine (XYLOCAINE) 2 % solution Patient: Mix 1part 2% viscous lidocaine, 1part H20. Swallow 50m of diluted mixture, 348m before meals and at bedtime, up to QID 200 mL 3   ondansetron (ZOFRAN) 8 MG tablet Take by mouth.      No current facility-administered medications for this visit.   Facility-Administered Medications Ordered in Other Visits  Medication Dose Route Frequency Provider Last Rate Last Admin   fluorouracil (ADRUCIL) 4,500 mg in sodium chloride 0.9 % 60 mL chemo infusion  2,400 mg/m2 (Treatment Plan Recorded) Intravenous 1 day or 1 dose FeTruitt MerleMD       heparin lock flush 100 unit/mL  500 Units Intracatheter Once PRN FeTruitt MerleMD       sodium chloride flush (NS) 0.9 % injection 10 mL  10 mL Intracatheter PRN FeTruitt MerleMD        PHYSICAL EXAMINATION: ECOG PERFORMANCE STATUS: 1 - Symptomatic but completely ambulatory  Vitals:   08/08/21 0847  BP: 104/73  Pulse: 81  Resp: 18  Temp: 98.8 F (37.1 C)  SpO2: 100%   Filed Weights   08/08/21 0847  Weight: 146 lb 2 oz (66.3 kg)    GENERAL:alert, no distress and comfortable SKIN: No rash HEENT: sclera clear.  Ear canals with intact pearly gray TM, no erythema or edema NECK: Palpable enlarged thyroid  LYMPH:  no palpable cervical or supraclavicular lymphadenopathy LUNGS: clear with normal breathing effort HEART: regular rate & rhythm, no lower extremity edema ABDOMEN:abdomen soft, non-tender and normal bowel sounds NEURO: alert & oriented x 3 with fluent speech, no focal motor/sensory deficits PAC without erythema  LABORATORY DATA:  I have reviewed the data as listed CBC Latest Ref Rng & Units 08/08/2021 07/18/2021 07/05/2021  WBC 4.0 - 10.5 K/uL 3.3(L) 4.8 3.1(L)  Hemoglobin 13.0 - 17.0 g/dL 11.7(L) 10.6(L) 11.1(L)  Hematocrit 39.0 - 52.0 % 32.9(L) 29.7(L) 30.7(L)  Platelets 150 - 400 K/uL 103(L) 121(L) 129(L)     CMP Latest Ref Rng & Units 08/08/2021 07/18/2021 07/05/2021  Glucose 70 - 99 mg/dL 134(H) 91 162(H)  BUN 8 - 23 mg/dL _0 Creatinine 0.61 - 1.24 mg/dL 0.79 0.80 0.78  Sodium 135 - 145 mmol/L 141 139 139  Potassium 3.5 - 5.1 mmol/L 3.6 4.0 3.3(L)  Chloride 98 - 111 mmol/L 107 103 104  CO2 22 - 32 mmol/L _1 Calcium 8.9 - 10.3 mg/dL 9.2 9.5 9.3  Total Protein 6.5 - 8.1 g/dL 6.4(L) 6.7 6.5  Total Bilirubin 0.3 - 1.2 mg/dL 0.8 0.6 0.7  Alkaline Phos 38 - 126 U/L 83 77 68  AST 15 - 41 U/L _2 ALT 0 - 44 U/L 20 39 25      RADIOGRAPHIC STUDIES: I have personally reviewed the radiological images as listed and agreed with the findings in the report. No results found.   ASSESSMENT & PLAN: Nathaniel Jennings is a 65 y.o. male with    1. 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 -EGD performed by Dr. Alessandra Bevels on 04/19/21 showed a partially obstructing, likely malignant, esophageal tumor in the distal esophagus and a polyp in the mid esophagus. Biopsy of both confirmed invasive moderately differentiated adenocarcinoma of the esophagus, Her2 negative. -CT CAP on 05/05/21 showing: esophageal mass not well visualized; indeterminate mildly-enlarged high right paratracheal node near thoracic inlet; no other mediastinal adenopathy or evidence of metastatic disease in abdomen or pelvis; stable small pulmonary nodules bilaterally. -EUS 05/11/21 staged this as T3 N2 Mx. -PET scan 05/16/21 showed: 6.2 cm distal esophageal mass; hypermetabolic upper right paratracheal lymph node.  No other distant metastasis.  -EBUS biopsy on 05/26/21, path from 2R lymph node confirmed malignant cells consistent with his primary esophageal cancer. This node is not resectable by surgery  -He began concurrent chemoRT with weekly TC on 05/24/21. He completed radiation therapy on 07/04/21. -We changed his chemo to FOLFOX on 06/20/21, at reduced dose for first cycle due to mild neutropenia and concurrent RT. Tolerating well  -restaging PET 08/04/21 showed decreased hypermetabolism at the biopsy proven esophageal malignancy with improvement at the right paratracheal node. There is diffuse  hypermetabolic FDG accumulation noted in the esophagus with more diffuse circumferential esophageal wall thickenin than on the previous exam.  As well as Interval development of a new 6 mm short axis right cervical node with hypermetabolism.  Significance of the new right cervical node is indeterminate, the patient does report a new possible right ear infection -Ultrasound-guided biopsy of the thyroid and right cervical node are pending.  Been referred back to Dr. Alessandra Bevels for repeat EGD to evaluate the esophageal wall thickening -Continue FOLFOX for now  -2. Severe dysphagia and odynophagia with weight loss -He  lost about 15 lbs from April to July 2022. -He had dysphagia prior to treatment which initially improved after chemo RT, then developed burning with swallowing, secondary to radiation esophagitis -He required Hycet, lidocaine, and Carafate -He has clinically improved, swallowing is better, tolerating mostly normal diet -Continue Carafate as needed   3. Hypermetabolic thyroid nodule -seen on PET 05/16/21 (and not on head/neck US on 04/18/21) -repeat head/neck US on 06/03/21 showed a 1.1 cm inferior right nodule, corresponding to hypermetabolic focus on PET.  -Biopsy scheduled 08/16/2021   4. HTN and BPH -continue medication, will monitor his blood pressure closely during treatment.    Disposition: Nathaniel Jennings appears stable.  He is s/p chemoRT with carbo/Taxol and 3 cycles of FOLFOX.  He tolerates treatment well with cold sensitivity, mild constipation and nausea.  He has no residual neuropathy in the absence of cold exposure.  Side effects are well managed with supportive care at home.  He is able to recover and function well.  Dysphagia has improved, tolerates a mostly normal diet.  He continues Carafate as needed.  This is consistent with a clinical response to treatment.    We reviewed his restaging PET scan which shows the hypermetabolism in the distal esophagus at the level of  the previously hypermetabolic neoplasm has decreased, but there remains to be diffuse hypermetabolism with circumferential wall thickening in the esophagus.  Dr. Isidore Moos feels this is reactive esophagitis from RT.  PET scan also shows treatment response to the right paratracheal node however there was interval development of a new 6 mm right cervical node that is intensely hypermetabolic.  Nathaniel Jennings reports 1.5 week h/o right ear drainage and pressure in the setting of chronic ear infections, exam does not strongly support an infection.  I recommend symptom management with Flonase and antihistamines.  He will call his ENT. I recommend to hold flu vac until symptoms resolve. We discussed the new right cervical node could be reactive, but need to r/o metastasis.  Patient recently followed up with Dr. Isidore Moos and Dr. Kipp Brood and was seen today with Dr. Burr Medico.  The recommendation is to proceed with a right cervical node biopsy, hopefully can be done by IR on same day as thyroid biopsy 08/16/2021.  We have sent a message to Dr. B to repeat EGD to evaluate the esophageal wall thickening.  We recommend to continue FOLFOX for now, labs adequate to proceed with another cycle today as planned. Hold aspirin for 1 week following chemo, then resume normal dosing.  Follow-up with Dr. Kipp Brood 08/19/2021 and with [place] in 2 weeks with next cycle FOLFOX.   Orders Placed This Encounter  Procedures   [place] FNA BIOPSY SOFT TISSUE, EA ADDT'L LESION    Standing Status:   Future    Standing Expiration Date:   08/08/2022    Order Specific Question:   Lab orders requested (DO NOT place separate lab orders, these will be automatically ordered during procedure specimen collection):    Answer:   Surgical Pathology    Order Specific Question:   Reason for Exam (SYMPTOM  OR DIAGNOSIS REQUIRED)    Answer:   r/o metastasis, esophagus cancer, new PET avid R cervical node. recent ear inflammation/drainage    Order Specific Question:    Preferred location?    Answer:   [facility]   Patient and his wife agree with the plan. All questions were answered. The patient knows to call the clinic with any problems, questions or concerns. No barriers  to learning were detected.     Alla Feeling, NP 08/08/21    Addendum  I have reviewed the above documentation for accuracy and completeness, and I agree with the above.  I have reviewed his restaging PET scan images myself and discussed with pt in detail. He has developed a new hypermetabolic node in right low cervical area, not palpable on exam. He also has diffuse hypermetabolism in most of esophagus which could be related to radiation or infectious esophagitis. I have reviewed this with Drs. Squrie and Cresson, pt is agreeable with cervical node biopsy if IR can try. If the biopsy show metastatic cancer, he would not be eligible for surgery and I will plan to continue chemo. Will also send him back to GI for repeated EGD.  All questions were answered.   [name]  08/08/2021

## 2021-08-05 ENCOUNTER — Ambulatory Visit
Admission: RE | Admit: 2021-08-05 | Discharge: 2021-08-05 | Disposition: A | Discharge status: Home / Self Care | Payer: BC Managed Care – PPO | Source: Ambulatory Visit | Attending: Radiation Oncology | Admitting: Radiation Oncology

## 2021-08-05 ENCOUNTER — Other Ambulatory Visit: Payer: Self-pay

## 2021-08-05 ENCOUNTER — Encounter: Payer: Self-pay | Admitting: Radiation Oncology

## 2021-08-05 ENCOUNTER — Ambulatory Visit (INDEPENDENT_AMBULATORY_CARE_PROVIDER_SITE_OTHER): Payer: BC Managed Care – PPO | Admitting: Thoracic Surgery (Cardiothoracic Vascular Surgery)

## 2021-08-05 VITALS — BP 113/76 | HR 83 | Resp 20 | Ht 66.0 in | Wt 146.8 lb

## 2021-08-05 VITALS — BP 106/76 | HR 85 | Temp 97.8°F | Resp 18 | Ht 66.0 in | Wt 146.0 lb

## 2021-08-05 DIAGNOSIS — E041 Nontoxic single thyroid nodule: Secondary | ICD-10-CM | POA: Diagnosis not present

## 2021-08-05 DIAGNOSIS — N400 Enlarged prostate without lower urinary tract symptoms: Secondary | ICD-10-CM | POA: Insufficient documentation

## 2021-08-05 DIAGNOSIS — C158 Malignant neoplasm of overlapping sites of esophagus: Secondary | ICD-10-CM

## 2021-08-05 DIAGNOSIS — Z79899 Other long term (current) drug therapy: Secondary | ICD-10-CM | POA: Insufficient documentation

## 2021-08-05 DIAGNOSIS — Z923 Personal history of irradiation: Secondary | ICD-10-CM | POA: Insufficient documentation

## 2021-08-05 DIAGNOSIS — Z7982 Long term (current) use of aspirin: Secondary | ICD-10-CM | POA: Diagnosis not present

## 2021-08-05 DIAGNOSIS — J439 Emphysema, unspecified: Secondary | ICD-10-CM | POA: Diagnosis not present

## 2021-08-05 DIAGNOSIS — K8020 Calculus of gallbladder without cholecystitis without obstruction: Secondary | ICD-10-CM | POA: Insufficient documentation

## 2021-08-05 MED FILL — Dexamethasone Sodium Phosphate Inj 100 MG/10ML: INTRAMUSCULAR | Qty: 1 | Status: AC

## 2021-08-05 NOTE — Progress Notes (Signed)
Patient in for follow up after esophageal radiation continues to have some burning pain with some foods. No issues swallowing any foods at this time. Denies any pain at present. Has stopped his carafate. Weight is stable at this time. BP 106/76 (BP Location: Right Arm, Patient Position: Sitting, Cuff Size: Large)   Pulse 85   Temp 97.8 F (36.6 C)   Resp 18   Ht 5\' 6"  (1.676 m)   Wt 146 lb (66.2 kg)   SpO2 100%   BMI 23.57 kg/m

## 2021-08-05 NOTE — Progress Notes (Signed)
Radiation Oncology         [phone] ________________________________  Name: Nathaniel Jennings MRN: [id number]  Date: 08/05/2021  DOB: December 27, 1955  Follow-Up Visit Note  Outpatient  CC: Cyndi Bender, Joline Maxcy, MD  Diagnosis and Prior Radiotherapy:    ICD-10-CM   1. Malignant neoplasm of overlapping sites of esophagus ([facility])  C15.8       CHIEF COMPLAINT: Here for follow-up and surveillance of esophageal cancer  Narrative:  The patient returns today for routine follow-up.  Patient in for follow up after esophageal radiation continues to have some burning pain with some foods. No issues swallowing any foods at this time. Denies any pain at present. Has stopped his carafate. Weight is stable at this time.  He has follow-up today with cardiothoracic surgery and he sees medical oncology next week.  He anticipates a biopsy of his thyroid towards the end of the month.  He denies any palpable adenopathy in his neck but does have some swelling in the right thyroid region.  I reviewed his recent restaging PET scan personally as well as with the patient and his significant other today.  ALLERGIES:  is allergic to bee venom.  Meds: Current Outpatient Medications  Medication Sig Dispense Refill   aspirin EC 81 MG tablet Take 81 mg by mouth in the morning.     metoprolol succinate (TOPROL-XL) 100 MG 24 hr tablet Take 50 mg by mouth in the morning.     pantoprazole (PROTONIX) 40 MG tablet Take 1 tablet (40 mg total) by mouth 2 (two) times daily. 30 tablet 1   HYDROcodone-acetaminophen (HYCET) 7.5-325 mg/15 ml solution Take 5-10 mLs by mouth every 4 (four) hours as needed for moderate pain. 473 mL 0   lidocaine (XYLOCAINE) 2 % solution Patient: Mix 1part 2% viscous lidocaine, 1part H20. Swallow 45mL of diluted mixture, 56min before meals and at bedtime, up to QID 200 mL 3   ondansetron (ZOFRAN) 8 MG tablet Take by mouth.     sucralfate (CARAFATE) 1 g tablet Dissolve 1 tablet in 10 mL H20 and  swallow up to QID prn soreness. 40 tablet 5   No current facility-administered medications for this encounter.    Physical Findings: The patient is in no acute distress. Patient is alert and oriented.  height is 5\' 6"  (1.676 m) and weight is 146 lb (66.2 kg). His temperature is 97.8 F (36.6 C). His blood pressure is 106/76 and his pulse is 85. His respiration is 18 and oxygen saturation is 100%. .    No palpable adenopathy in the neck.  There is some swelling in the right thyroid region corresponding to the lesion on his PET and CT imaging  ECOG = 1  Lab Findings: Lab Results  Component Value Date   WBC 4.8 07/18/2021   HGB 10.6 (L) 07/18/2021   HCT 29.7 (L) 07/18/2021   MCV 97.1 07/18/2021   PLT 121 (L) 07/18/2021    Radiographic Findings: NM PET Image Restag (PS) Skull Base To Thigh  Result Date: 08/05/2021 CLINICAL DATA:  Subsequent treatment strategy for esophageal cancer. EXAM: NUCLEAR MEDICINE PET SKULL BASE TO THIGH TECHNIQUE: 7.4 mCi F-18 FDG was injected intravenously. Full-ring PET imaging was performed from the skull base to thigh after the radiotracer. CT data was obtained and used for attenuation correction and anatomic localization. Fasting blood glucose: 103 mg/dl COMPARISON:  05/16/2021 FINDINGS: Mediastinal blood pool activity: SUV max 2.6 Liver activity: SUV max NA NECK: New 6 mm  short axis right cervical node on image 35/4 is markedly hypermetabolic with SUV max = 6.1. Hypermetabolic right thyroid nodule again noted, similar to prior. This has been evaluated on previous imaging. (ref: J Am Coll Radiol. 2015 Feb;12(2): 143-50). Incidental CT findings: none CHEST: The esophagus is diffusely hypermetabolic on today's study with circumferential wall thickening extending from just below the thoracic inlet down to the distal esophagus. No hypermetabolic mediastinal or hilar lymphadenopathy. Hypermetabolic upper right paratracheal lymphadenopathy seen on the previous study has  resolved. The index 13 mm lymph node measured previously is now 4 mm without hypermetabolism (image 57/4). Index 7 mm short axis low right paratracheal node measured previously is stable in size and again shows no hypermetabolism. Tiny nodule right lung base is stable on image 100/4. There is a similar tiny nodule in the left upper lobe on 84/4 that is unchanged. Neither shows hypermetabolism but both are well below threshold for reliable resolution on PET imaging. No hypermetabolic pulmonary nodule or mass. Incidental CT findings: none ABDOMEN/PELVIS: No abnormal hypermetabolic activity within the liver, pancreas, adrenal glands, or spleen. No hypermetabolic lymph nodes in the abdomen or pelvis. Incidental CT findings: Tiny calcified gallstones evident. Moderate atherosclerotic calcification noted in the abdominal aorta without aneurysm. Prostate gland is enlarged. SKELETON: No focal hypermetabolic activity to suggest skeletal metastasis. Incidental CT findings: none IMPRESSION: 1. Interval development of a new 6 mm short axis right cervical node with hypermetabolism. Finding is somewhat indeterminate given the apparent response of the hypermetabolic right paratracheal lymphadenopathy seen on the previous study. Close follow-up recommended as metastatic disease not excluded. 2. Diffuse hypermetabolic FDG accumulation noted in the esophagus today with more diffuse circumferential esophageal wall thickening on today's study than on the previous exam. Hypermetabolism in the distal esophagus at the level of the hypermetabolic neoplasm previously has decreased from SUV max = 13 to SUV max = 7 today. 3. Stable tiny 3-4 mm right lower lobe and left upper lobe pulmonary nodules. Continued attention on follow-up recommended. 4. Cholelithiasis. 5. Emphysema.  (ICD10-J43.9) 6.  Aortic Atherosclerois (ICD10-170.0) 7. Electronically Signed   By: Misty Stanley M.D.   On: 08/05/2021 09:59    Impression/Plan: Today I reviewed  the patient's images with him and his wife comparing pretreatment and posttreatment PET images.  The diffuse hypermetabolic activity in his esophagus is consistent with an inflammatory reaction to the radiation.  It will probably take several weeks to months for this inflammation to go down completely.  However the esophagus does appear more patent in the areas that previously had tumor.  The patient's adenopathy in his chest has resolved.  He does have a tiny right cervical lymph node that is quite hypermetabolic and concerning.  I sent a note to Dr. Kipp Brood and Dr. Burr Medico suggesting that the patient be referred to interventional radiology for biopsy of that node.  It is possible that this biopsy could be double booked with his thyroid biopsy.  The patient is aware of all of the above.  He will follow-up with the rest of his team in the near future and soon we should be able to clarify the next steps, understanding that we may need to obtain more tissue before it is clear whether he should receive more chemotherapy or consider esophagectomy. I will see him back on an as-needed basis.  On date of service, in total, I spent 30 minutes on this encounter. Patient was seen in person.  _____________________________________   Eppie Gibson, MD

## 2021-08-05 NOTE — Progress Notes (Signed)
      [address]       [place] [address]             [phone]        Lealand L Bruso [facility] Medical Record #[id number] Date of Birth: Mar 24, 1956  Referring: Arta Silence, MD Primary Care: Cyndi Bender, PA-C Primary Cardiologist:None  Reason for visit:   follow-up  History of Present Illness:     Mr. Reinders comes in for his first appointment following completion of chemo radiation.  This is a gentleman that has a distal esophageal cancer, and a biopsy-proven thoracic inlet lymph node.  He did well with his chemotherapy and radiation.  His swallowing is much improved.  He has been able to gain weight since completion of his neoadjuvant therapy.  He does complain of an ear infection and states that he has a long history of this since he was a child.  Physical Exam: BP 113/76 (BP Location: Right Arm, Patient Position: Sitting)   Pulse 83   Resp 20   Ht 5\' 6"  (1.676 m)   Wt 146 lb 12.8 oz (66.6 kg)   SpO2 99% Comment: RA  BMI 23.69 kg/m   Alert NAD Easy work of breathing Abdomen ND No peripheral edema   Diagnostic Studies & Laboratory data: I personally reviewed the PET/CT.  There is a new cervical lymph node on the right that is avid.  It measures about 6 mm.  He has had a good response to the neoadjuvant therapy to his esophageal tumor.  His esophagus is diffusely hypermetabolic which is new compared to his previous PET/CT.  The previous cervical lymph node has decreased in size and is no longer hypermetabolic.    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  The PET/CT does show diffuse avidity within the esophagus which is quite concerning, and he also has a new cervical lymph node on the right side which has grown and is now avid.  He does have a history of chronic ear infections on the right so this may be related but given  his original presentation I think it is warranted to obtain a biopsy of his cervical lymph node.  I also wonder if we should obtain a new biopsy of his esophagus as well.  He is scheduled to meet with Dr. Burr Medico in the next week, and I will discuss this with her shortly.   Lajuana Matte 08/05/2021 4:56 PM

## 2021-08-08 ENCOUNTER — Other Ambulatory Visit: Payer: Self-pay

## 2021-08-08 ENCOUNTER — Inpatient Hospital Stay: Payer: BC Managed Care – PPO

## 2021-08-08 ENCOUNTER — Inpatient Hospital Stay: Payer: BC Managed Care – PPO | Attending: Hematology

## 2021-08-08 ENCOUNTER — Inpatient Hospital Stay (HOSPITAL_BASED_OUTPATIENT_CLINIC_OR_DEPARTMENT_OTHER): Payer: BC Managed Care – PPO | Admitting: Nurse Practitioner

## 2021-08-08 ENCOUNTER — Encounter: Payer: Self-pay | Admitting: Nurse Practitioner

## 2021-08-08 VITALS — BP 104/73 | HR 81 | Temp 98.8°F | Resp 18 | Wt 146.1 lb

## 2021-08-08 DIAGNOSIS — R9389 Abnormal findings on diagnostic imaging of other specified body structures: Secondary | ICD-10-CM

## 2021-08-08 DIAGNOSIS — Z95828 Presence of other vascular implants and grafts: Secondary | ICD-10-CM

## 2021-08-08 DIAGNOSIS — C158 Malignant neoplasm of overlapping sites of esophagus: Secondary | ICD-10-CM | POA: Insufficient documentation

## 2021-08-08 DIAGNOSIS — Z5111 Encounter for antineoplastic chemotherapy: Secondary | ICD-10-CM | POA: Insufficient documentation

## 2021-08-08 LAB — CBC WITH DIFFERENTIAL (CANCER CENTER ONLY)
Abs Immature Granulocytes: 0.03 10*3/uL (ref 0.00–0.07)
Basophils Absolute: 0 10*3/uL (ref 0.0–0.1)
Basophils Relative: 1 %
Eosinophils Absolute: 0.1 10*3/uL (ref 0.0–0.5)
Eosinophils Relative: 2 %
HCT: 32.9 % — ABNORMAL LOW (ref 39.0–52.0)
Hemoglobin: 11.7 g/dL — ABNORMAL LOW (ref 13.0–17.0)
Immature Granulocytes: 1 %
Lymphocytes Relative: 18 %
Lymphs Abs: 0.6 10*3/uL — ABNORMAL LOW (ref 0.7–4.0)
MCH: 35.7 pg — ABNORMAL HIGH (ref 26.0–34.0)
MCHC: 35.6 g/dL (ref 30.0–36.0)
MCV: 100.3 fL — ABNORMAL HIGH (ref 80.0–100.0)
Monocytes Absolute: 0.5 10*3/uL (ref 0.1–1.0)
Monocytes Relative: 16 %
Neutro Abs: 2 10*3/uL (ref 1.7–7.7)
Neutrophils Relative %: 62 %
Platelet Count: 103 10*3/uL — ABNORMAL LOW (ref 150–400)
RBC: 3.28 MIL/uL — ABNORMAL LOW (ref 4.22–5.81)
RDW: 16 % — ABNORMAL HIGH (ref 11.5–15.5)
WBC Count: 3.3 10*3/uL — ABNORMAL LOW (ref 4.0–10.5)
nRBC: 0 % (ref 0.0–0.2)

## 2021-08-08 LAB — CMP (CANCER CENTER ONLY)
ALT: 20 U/L (ref 0–44)
AST: 23 U/L (ref 15–41)
Albumin: 3.3 g/dL — ABNORMAL LOW (ref 3.5–5.0)
Alkaline Phosphatase: 83 U/L (ref 38–126)
Anion gap: 10 (ref 5–15)
BUN: 10 mg/dL (ref 8–23)
CO2: 24 mmol/L (ref 22–32)
Calcium: 9.2 mg/dL (ref 8.9–10.3)
Chloride: 107 mmol/L (ref 98–111)
Creatinine: 0.79 mg/dL (ref 0.61–1.24)
GFR, Estimated: >60 mL/min (ref >60)
Glucose, Bld: 134 mg/dL — ABNORMAL HIGH (ref 70–99)
Potassium: 3.6 mmol/L (ref 3.5–5.1)
Sodium: 141 mmol/L (ref 135–145)
Total Bilirubin: 0.8 mg/dL (ref 0.3–1.2)
Total Protein: 6.4 g/dL — ABNORMAL LOW (ref 6.5–8.1)

## 2021-08-08 MED ORDER — SODIUM CHLORIDE 0.9 % IV SOLN
2400.0000 mg/m2 | INTRAVENOUS | Status: DC
  Administered 2021-08-08: 4500 mg via INTRAVENOUS
  Filled 2021-08-08: qty 90

## 2021-08-08 MED ORDER — SODIUM CHLORIDE 0.9 % IV SOLN
10.0000 mg | Freq: Once | INTRAVENOUS | Status: AC
  Administered 2021-08-08: 10 mg via INTRAVENOUS
  Filled 2021-08-08: qty 10

## 2021-08-08 MED ORDER — LEUCOVORIN CALCIUM INJECTION 350 MG
400.0000 mg/m2 | Freq: Once | INTRAVENOUS | Status: AC
  Administered 2021-08-08: 748 mg via INTRAVENOUS
  Filled 2021-08-08: qty 37.4

## 2021-08-08 MED ORDER — SODIUM CHLORIDE 0.9% FLUSH: 10.0000 mL | INTRAVENOUS | Status: DC | PRN

## 2021-08-08 MED ORDER — SODIUM CHLORIDE 0.9% FLUSH
10.0000 mL | Freq: Once | INTRAVENOUS | Status: AC
  Administered 2021-08-08: 10 mL

## 2021-08-08 MED ORDER — OXALIPLATIN CHEMO INJECTION 100 MG/20ML
85.0000 mg/m2 | Freq: Once | INTRAVENOUS | Status: AC
  Administered 2021-08-08: 160 mg via INTRAVENOUS
  Filled 2021-08-08: qty 32

## 2021-08-08 MED ORDER — HEPARIN SOD (PORK) LOCK FLUSH 100 UNIT/ML IV SOLN: 500.0000 [IU] | Freq: Once | INTRAVENOUS | Status: DC | PRN

## 2021-08-08 MED ORDER — PALONOSETRON HCL INJECTION 0.25 MG/5ML
0.2500 mg | Freq: Once | INTRAVENOUS | Status: AC
  Administered 2021-08-08: 0.25 mg via INTRAVENOUS
  Filled 2021-08-08: qty 5

## 2021-08-08 MED ORDER — DEXTROSE 5 % IV SOLN
Freq: Once | INTRAVENOUS | Status: AC
Start: 2021-08-08 — End: 2021-08-08

## 2021-08-08 NOTE — Patient Instructions (Signed)
[facility]  Discharge Instructions: Thank you for choosing [facility] to provide your oncology and hematology care.   If you have a lab appointment with the [facility], please go directly to the [facility] and check in at the registration area.   Wear comfortable clothing and clothing appropriate for easy access to any Portacath or PICC line.   We strive to give you quality time with your provider. You may need to reschedule your appointment if you arrive late (15 or more minutes).  Arriving late affects you and other patients whose appointments are after yours.  Also, if you miss three or more appointments without notifying the office, you may be dismissed from the clinic at the provider's discretion.      For prescription refill requests, have your pharmacy contact our office and allow 72 hours for refills to be completed.    Today you received the following chemotherapy and/or immunotherapy agents Oxaliplatin, leucovorin and 5 FU      To help prevent nausea and vomiting after your treatment, we encourage you to take your nausea medication as directed.  BELOW ARE SYMPTOMS THAT SHOULD BE REPORTED IMMEDIATELY: *FEVER GREATER THAN 100.4 F (38 C) OR HIGHER *CHILLS OR SWEATING *NAUSEA AND VOMITING THAT IS NOT CONTROLLED WITH YOUR NAUSEA MEDICATION *UNUSUAL SHORTNESS OF BREATH *UNUSUAL BRUISING OR BLEEDING *URINARY PROBLEMS (pain or burning when urinating, or frequent urination) *BOWEL PROBLEMS (unusual diarrhea, constipation, pain near the anus) TENDERNESS IN MOUTH AND THROAT WITH OR WITHOUT PRESENCE OF ULCERS (sore throat, sores in mouth, or a toothache) UNUSUAL RASH, SWELLING OR PAIN  UNUSUAL VAGINAL DISCHARGE OR ITCHING   Items with * indicate a potential emergency and should be followed up as soon as possible or go to the Emergency Department if any problems should occur.  Please show the CHEMOTHERAPY ALERT CARD or IMMUNOTHERAPY  ALERT CARD at check-in to the Emergency Department and triage nurse.  Should you have questions after your visit or need to cancel or reschedule your appointment, please contact [facility]  Dept: [phone]  and follow the prompts.  Office hours are 8:00 a.m. to 4:30 p.m. Monday - Friday. Please note that voicemails left after 4:00 p.m. may not be returned until the following business day.  We are closed weekends and major holidays. You have access to a nurse at all times for urgent questions. Please call the main number to the clinic Dept: [phone] and follow the prompts.   For any non-urgent questions, you may also contact your provider using MyChart. We now offer e-Visits for anyone 67 and older to request care online for non-urgent symptoms. For details visit [email or web address].   Also download the MyChart app! Go to the app store, search "MyChart", open the app, select [facility], and log in with your MyChart username and password.  Due to Covid, a mask is required upon entering the hospital/clinic. If you do not have a mask, one will be given to you upon arrival. For doctor visits, patients may have 1 support person aged 28 or older with them. For treatment visits, patients cannot have anyone with them due to current Covid guidelines and our immunocompromised population.

## 2021-08-09 ENCOUNTER — Telehealth: Payer: Self-pay | Admitting: Hematology

## 2021-08-09 ENCOUNTER — Encounter: Payer: Self-pay | Admitting: Hematology

## 2021-08-09 NOTE — Telephone Encounter (Signed)
Left message with follow-up appointments per 10/17 los. 

## 2021-08-10 ENCOUNTER — Encounter (HOSPITAL_COMMUNITY): Payer: Self-pay | Admitting: Radiology

## 2021-08-10 ENCOUNTER — Other Ambulatory Visit: Payer: Self-pay

## 2021-08-10 ENCOUNTER — Inpatient Hospital Stay: Payer: BC Managed Care – PPO

## 2021-08-10 VITALS — BP 102/71 | HR 79 | Temp 98.9°F | Resp 16

## 2021-08-10 DIAGNOSIS — Z95828 Presence of other vascular implants and grafts: Secondary | ICD-10-CM

## 2021-08-10 MED ORDER — SODIUM CHLORIDE 0.9% FLUSH: 10.0000 mL | Freq: Once | INTRAVENOUS | Status: DC

## 2021-08-10 MED ORDER — HEPARIN SOD (PORK) LOCK FLUSH 100 UNIT/ML IV SOLN: 500.0000 [IU] | Freq: Once | INTRAVENOUS | Status: DC

## 2021-08-10 NOTE — Progress Notes (Signed)
Patient Name  Nathaniel Jennings, Nathaniel Jennings Legal Sex  Male DOB  07-Nov-1955 SSN  [id number] Address (Temporary)  [address]  [place] [address] Contact Numbers (Temporary)  [phone]    FW: [place] FNA BIOPSY SOFT TISSUE, EA ADDT'L LESION Received: Today Arne Cleveland, MD  [place], Vanetta Rule D Ok   [place] FNA biopsy R cerv LN  See PET: anterior to carotid bifurcation   DDH        Previous Messages   ----- Message -----  From: Alla Feeling, NP  Sent: 08/10/2021   9:30 AM EDT  To: Arne Cleveland, MD, Jillyn Hidden, *  Subject: RE: [place] FNA BIOPSY SOFT TISSUE, EA ADDT'L LES*   Let's try FNA first, need to r/o met. We can probably use other tissue from previous biopsies for molecular testing if needed.   Thank you Dr. Vernard Gambles.   Lacie  ----- Message -----  From: Arne Cleveland, MD  Sent: 08/10/2021   8:04 AM EDT  To: Rema Fendt, NP  Subject: FW: [place] FNA BIOPSY SOFT TISSUE, EA ADDT'L LES*   R cervical node to small for core biopsy  We could try FNA to r/o met but prob not tissue for molecular studies; surveillance or surgical resection  are alternatives.  Let me know how you'd like to proceed.   Thx  DDH     ----- Message -----  From: Garth Bigness D  Sent: 08/08/2021  12:43 PM EDT  To: [facility] Procedure Requests  Subject: [place] FNA BIOPSY SOFT TISSUE, EA ADDT'L LESION     Procedure:  [place] FNA BIOPSY SOFT TISSUE, EA ADDT'L LESION   Reason:  Abnormal finding on imaging, r/o metastasis, esophagus cancer, new PET avid R cervical node. recent ear inflammation/drainage   History:  [place], CT, NM PET in computer, Thyroid BX scheduled at [facility]   Provider:  Alla Feeling   Provider Contact:  [phone]

## 2021-08-15 ENCOUNTER — Inpatient Hospital Stay: Payer: BC Managed Care – PPO

## 2021-08-15 ENCOUNTER — Other Ambulatory Visit: Payer: Self-pay | Admitting: Hematology

## 2021-08-15 ENCOUNTER — Inpatient Hospital Stay: Payer: BC Managed Care – PPO | Admitting: Nurse Practitioner

## 2021-08-15 DIAGNOSIS — C158 Malignant neoplasm of overlapping sites of esophagus: Secondary | ICD-10-CM

## 2021-08-16 ENCOUNTER — Other Ambulatory Visit: Payer: Self-pay | Admitting: Radiology

## 2021-08-16 ENCOUNTER — Ambulatory Visit
Admission: RE | Admit: 2021-08-16 | Discharge: 2021-08-16 | Disposition: A | Discharge status: Home / Self Care | Payer: BC Managed Care – PPO | Source: Ambulatory Visit | Attending: Hematology | Admitting: Hematology

## 2021-08-16 ENCOUNTER — Other Ambulatory Visit (HOSPITAL_COMMUNITY)
Admission: RE | Admit: 2021-08-16 | Discharge: 2021-08-16 | Disposition: A | Discharge status: Home / Self Care | Payer: BC Managed Care – PPO | Source: Ambulatory Visit | Attending: Radiology | Admitting: Radiology

## 2021-08-16 ENCOUNTER — Other Ambulatory Visit: Payer: Self-pay

## 2021-08-16 DIAGNOSIS — C158 Malignant neoplasm of overlapping sites of esophagus: Secondary | ICD-10-CM | POA: Insufficient documentation

## 2021-08-16 DIAGNOSIS — E041 Nontoxic single thyroid nodule: Secondary | ICD-10-CM | POA: Diagnosis not present

## 2021-08-17 ENCOUNTER — Encounter (HOSPITAL_COMMUNITY): Payer: Self-pay

## 2021-08-17 ENCOUNTER — Ambulatory Visit (HOSPITAL_COMMUNITY)
Admission: RE | Admit: 2021-08-17 | Discharge: 2021-08-17 | Disposition: A | Discharge status: Home / Self Care | Payer: BC Managed Care – PPO | Source: Ambulatory Visit | Attending: Nurse Practitioner | Admitting: Nurse Practitioner

## 2021-08-17 ENCOUNTER — Inpatient Hospital Stay: Payer: BC Managed Care – PPO

## 2021-08-17 ENCOUNTER — Other Ambulatory Visit: Payer: Self-pay | Admitting: Nurse Practitioner

## 2021-08-17 ENCOUNTER — Other Ambulatory Visit: Payer: Self-pay

## 2021-08-17 DIAGNOSIS — R9389 Abnormal findings on diagnostic imaging of other specified body structures: Secondary | ICD-10-CM | POA: Diagnosis not present

## 2021-08-17 DIAGNOSIS — C541 Malignant neoplasm of endometrium: Secondary | ICD-10-CM | POA: Diagnosis not present

## 2021-08-17 DIAGNOSIS — F17290 Nicotine dependence, other tobacco product, uncomplicated: Secondary | ICD-10-CM | POA: Diagnosis not present

## 2021-08-17 DIAGNOSIS — C159 Malignant neoplasm of esophagus, unspecified: Secondary | ICD-10-CM | POA: Insufficient documentation

## 2021-08-17 DIAGNOSIS — E041 Nontoxic single thyroid nodule: Secondary | ICD-10-CM | POA: Insufficient documentation

## 2021-08-17 LAB — CBC
HCT: 32.8 % — ABNORMAL LOW (ref 39.0–52.0)
Hemoglobin: 11.5 g/dL — ABNORMAL LOW (ref 13.0–17.0)
MCH: 35.6 pg — ABNORMAL HIGH (ref 26.0–34.0)
MCHC: 35.1 g/dL (ref 30.0–36.0)
MCV: 101.5 fL — ABNORMAL HIGH (ref 80.0–100.0)
Platelets: 84 10*3/uL — ABNORMAL LOW (ref 150–400)
RBC: 3.23 MIL/uL — ABNORMAL LOW (ref 4.22–5.81)
RDW: 14.6 % (ref 11.5–15.5)
WBC: 3.8 10*3/uL — ABNORMAL LOW (ref 4.0–10.5)
nRBC: 0 % (ref 0.0–0.2)

## 2021-08-17 LAB — PROTIME-INR
INR: 1 (ref 0.8–1.2)
Prothrombin Time: 13.2 seconds (ref 11.4–15.2)

## 2021-08-17 LAB — CYTOLOGY - NON PAP

## 2021-08-17 MED ORDER — MIDAZOLAM HCL 2 MG/2ML IJ SOLN
INTRAMUSCULAR | Status: AC | PRN
  Administered 2021-08-17: 1 mg via INTRAVENOUS

## 2021-08-17 MED ORDER — FENTANYL CITRATE (PF) 100 MCG/2ML IJ SOLN
INTRAMUSCULAR | Status: AC
  Filled 2021-08-17: qty 2

## 2021-08-17 MED ORDER — FENTANYL CITRATE (PF) 100 MCG/2ML IJ SOLN
INTRAMUSCULAR | Status: AC | PRN
  Administered 2021-08-17: 25 ug via INTRAVENOUS

## 2021-08-17 MED ORDER — HYDROCODONE-ACETAMINOPHEN 5-325 MG PO TABS: 1.0000 | ORAL_TABLET | ORAL | Status: DC | PRN

## 2021-08-17 MED ORDER — MIDAZOLAM HCL 2 MG/2ML IJ SOLN
INTRAMUSCULAR | Status: AC
  Filled 2021-08-17: qty 2

## 2021-08-17 MED ORDER — LIDOCAINE HCL (PF) 1 % IJ SOLN
INTRAMUSCULAR | Status: AC
  Filled 2021-08-17: qty 30

## 2021-08-17 MED ORDER — SODIUM CHLORIDE 0.9 % IV SOLN
INTRAVENOUS | Status: DC
  Administered 2021-08-17: 10 mL/h via INTRAVENOUS

## 2021-08-17 NOTE — Procedures (Signed)
  Procedure: [place] FNA biopsy R cervical lymph node   EBL:   minimal Complications:  none immediate  See full dictation in [facility].  Dillard Cannon MD Main # [phone] Pager  [phone] Mobile [phone]

## 2021-08-17 NOTE — Progress Notes (Signed)
Discharge instructions reviews with pt and [name]. Both voice understanding.

## 2021-08-17 NOTE — H&P (Signed)
Chief Complaint: Patient was seen in consultation today for right cervical lymph node biopsy at the request of Burton,Lacie K  Referring Physician(s): Dr Ky Barban  Supervising Physician: Arne Cleveland  Patient Status: [facility] - Out-pt  History of Present Illness: Nathaniel Jennings is a 65 y.o. male   New diagnosis esophageal cancer 03/2021 PET scan 08/03/21 revealing  IMPRESSION: 1. Interval development of a new 6 mm short axis right cervical node with hypermetabolism. Finding is somewhat indeterminate given the apparent response of the hypermetabolic right paratracheal lymphadenopathy seen on the previous study. Close follow-up recommended as metastatic disease not excluded. 2. Diffuse hypermetabolic FDG accumulation noted in the esophagus today with more diffuse circumferential esophageal wall thickening on today's study than on the previous exam. Hypermetabolism in the distal esophagus at the level of the hypermetabolic neoplasm previously has decreased from SUV max = 13 to SUV max = 7 today. 3. Stable tiny 3-4 mm right lower lobe and left upper lobe pulmonary nodules. Continued attention on follow-up recommended.  Hypermetabolic Rt inferior thyroid nodule -- biopsied yesterday  Chemo and radiation treatments ongoing  Dr Burr Medico note 08/08/21: I have reviewed his restaging PET scan images myself and discussed with pt in detail. He has developed a new hypermetabolic node in right low cervical area, not palpable on exam. He also has diffuse hypermetabolism in most of esophagus which could be related to radiation or infectious esophagitis. I have reviewed this with Drs. Squrie and Waldo, pt is agreeable with cervical node biopsy if IR can try. If the biopsy show metastatic cancer, he would not be eligible for surgery and I will plan to continue chemo.   Past Medical History:  Diagnosis Date   BPH (benign prostatic hyperplasia)    Dyslipidemia    Elevated PSA    Esophageal  cancer (Nathaniel Jennings)    Full dentures    GERD (gastroesophageal reflux disease)    History of melanoma excision    07/ 2011  s/p  wide excision left back--- per pathology-- negative maligant --  mild melanocytic atypia   History of palpitations    cardiologist --  dr Shelva Majestic (last note in epic 2014   Hypertension    Sleep apnea     Past Surgical History:  Procedure Laterality Date   COLONOSCOPY  10/24/2007   ESOPHAGOGASTRODUODENOSCOPY (EGD) WITH PROPOFOL N/A 05/11/2021   Procedure: ESOPHAGOGASTRODUODENOSCOPY (EGD) WITH PROPOFOL;  Surgeon: Arta Silence, MD;  Location: [facility];  Service: Endoscopy;  Laterality: N/A;   EUS N/A 05/11/2021   Procedure: ESOPHAGEAL ENDOSCOPIC ULTRASOUND (EUS) RADIAL;  Surgeon: Arta Silence, MD;  Location: [facility];  Service: Endoscopy;  Laterality: N/A;   EYE SURGERY     Lasik surgery on both eyes, but still needs glasses   FINE NEEDLE ASPIRATION  05/26/2021   Procedure: FINE NEEDLE ASPIRATION (FNA) LINEAR;  Surgeon: Garner Nash, DO;  Location: [facility];  Service: Pulmonary;;   IR IMAGING GUIDED PORT INSERTION  06/30/2021   PROSTATE BIOPSY N/A 07/02/2015   Procedure: BIOPSY TRANSRECTAL ULTRASONIC PROSTATE (TUBP);  Surgeon: Lowella Bandy, MD;  Location: [facility];  Service: Urology;  Laterality: N/A;   PROSTATE BIOPSY N/A 11/22/2015   Procedure: BIOPSY TRANSRECTAL ULTRASONIC PROSTATE (TUBP);  Surgeon: Cleon Gustin, MD;  Location: [facility];  Service: Urology;  Laterality: N/A;   TRANSTHORACIC ECHOCARDIOGRAM  11/19/2012   normal echo/  trivial TR   VIDEO BRONCHOSCOPY WITH ENDOBRONCHIAL ULTRASOUND N/A 05/26/2021   Procedure: VIDEO BRONCHOSCOPY  WITH ENDOBRONCHIAL ULTRASOUND;  Surgeon: Garner Nash, DO;  Location: [facility];  Service: Pulmonary;  Laterality: N/A;   WIDE EXCISION MELANOMA LEFT BACK  05/02/2010    Allergies: Bee venom  Medications: Prior to Admission medications   Medication Sig Start  Date End Date Taking? Authorizing Provider  metoprolol succinate (TOPROL-XL) 100 MG 24 hr tablet Take 50 mg by mouth in the morning. 11/03/17  Yes [provider]  ondansetron (ZOFRAN) 8 MG tablet Take 8 mg by mouth every 8 (eight) hours as needed for nausea or vomiting. 07/08/21  Yes [provider]  pantoprazole (PROTONIX) 40 MG tablet Take 1 tablet (40 mg total) by mouth 2 (two) times daily. 05/23/21  Yes [name], MD  triamcinolone cream (KENALOG) 0.1 % Apply 1 application topically 2 (two) times daily as needed (irritation).   Yes [provider]  aspirin EC 81 MG tablet Take 81 mg by mouth in the morning.    [provider]  HYDROcodone-acetaminophen (HYCET) 7.5-325 mg/15 ml solution Take 5-10 mLs by mouth every 4 (four) hours as needed for moderate pain. Patient not taking: Reported on 08/12/2021 07/11/21   [name], MD  lidocaine (XYLOCAINE) 2 % solution Patient: Mix 1part 2% viscous lidocaine, 1part H20. Swallow 3mL of diluted mixture, 107min before meals and at bedtime, up to QID Patient not taking: Reported on 08/12/2021 06/06/21   Eppie Gibson, MD  sucralfate (CARAFATE) 1 g tablet Dissolve 1 tablet in 10 mL H20 and swallow up to QID prn soreness. Patient not taking: Reported on 08/12/2021 06/06/21   Eppie Gibson, MD  prochlorperazine (COMPAZINE) 10 MG tablet Take 1 tablet (10 mg total) by mouth every 6 (six) hours as needed (Nausea or vomiting). 05/23/21 06/13/21  [name], MD     Family History  Problem Relation Age of Onset   Kidney failure Mother        died age 40   Pneumonia Father        died age 108    Social History   Socioeconomic History   Marital status: Single    Spouse name: Not on file   Number of children: 2   Years of education: Not on file   Highest education level: Not on file  Occupational History   Occupation: Architect  Tobacco Use   Smoking status: Former    Packs/day: 2.00    Years: 42.00    Pack years: 84.00     Types: Cigarettes    Quit date: 03/15/2011    Years since quitting: 10.4   Smokeless tobacco: Current    Types: Snuff   Tobacco comments:    occasional dip tobacco  Vaping Use   Vaping Use: Never used  Substance and Sexual Activity   Alcohol use: Yes    Alcohol/week: 2.0 standard drinks    Types: 2 Cans of beer per week    Comment: for 40 years   Drug use: Never   Sexual activity: Not on file  Other Topics Concern   Not on file  Social History Narrative   Lives alone.  Architect   Social Determinants of Radio broadcast assistant Strain: Low Risk    Difficulty of Paying Living Expenses: Not hard at all  Food Insecurity: No Food Insecurity   Worried About Charity fundraiser in the Last Year: Never true   Arboriculturist in the Last Year: Never true  Transportation Needs: No Transportation Needs   Lack of Transportation (  Medical): No   Lack of Transportation (Non-Medical): No  Physical Activity: Not on file  Stress: Not on file  Social Connections: Not on file     Review of Systems: A 12 point ROS discussed and pertinent positives are indicated in the HPI above.  All other systems are negative.  Review of Systems  Constitutional:  Negative for activity change, fatigue and fever.  HENT:  Positive for trouble swallowing.   Respiratory:  Negative for shortness of breath.   Cardiovascular:  Negative for chest pain.  Gastrointestinal:  Negative for nausea.  Psychiatric/Behavioral:  Negative for behavioral problems and confusion.    Vital Signs: BP 111/80   Pulse 80   Temp 98.2 F (36.8 C) (Oral)   Resp 15   Ht 5\' 6"  (1.676 m)   Wt 145 lb (65.8 kg)   SpO2 100%   BMI 23.40 kg/m   Physical Exam Vitals reviewed.  Cardiovascular:     Rate and Rhythm: Normal rate and regular rhythm.     Heart sounds: Normal heart sounds.  Pulmonary:     Breath sounds: Normal breath sounds.  Abdominal:     Palpations: Abdomen is soft.  Musculoskeletal:        General: Normal  range of motion.  Skin:    General: Skin is warm.  Neurological:     Mental Status: He is alert and oriented to person, place, and time.  Psychiatric:        Behavior: Behavior normal.    Imaging: NM PET Image Restag (PS) Skull Base To Thigh  Result Date: 08/05/2021 CLINICAL DATA:  Subsequent treatment strategy for esophageal cancer. EXAM: NUCLEAR MEDICINE PET SKULL BASE TO THIGH TECHNIQUE: 7.4 mCi F-18 FDG was injected intravenously. Full-ring PET imaging was performed from the skull base to thigh after the radiotracer. CT data was obtained and used for attenuation correction and anatomic localization. Fasting blood glucose: 103 mg/dl COMPARISON:  05/16/2021 FINDINGS: Mediastinal blood pool activity: SUV max 2.6 Liver activity: SUV max NA NECK: New 6 mm short axis right cervical node on image 35/4 is markedly hypermetabolic with SUV max = 6.1. Hypermetabolic right thyroid nodule again noted, similar to prior. This has been evaluated on previous imaging. (ref: J Am Coll Radiol. 2015 Feb;12(2): 143-50). Incidental CT findings: none CHEST: The esophagus is diffusely hypermetabolic on today's study with circumferential wall thickening extending from just below the thoracic inlet down to the distal esophagus. No hypermetabolic mediastinal or hilar lymphadenopathy. Hypermetabolic upper right paratracheal lymphadenopathy seen on the previous study has resolved. The index 13 mm lymph node measured previously is now 4 mm without hypermetabolism (image 57/4). Index 7 mm short axis low right paratracheal node measured previously is stable in size and again shows no hypermetabolism. Tiny nodule right lung base is stable on image 100/4. There is a similar tiny nodule in the left upper lobe on 84/4 that is unchanged. Neither shows hypermetabolism but both are well below threshold for reliable resolution on PET imaging. No hypermetabolic pulmonary nodule or mass. Incidental CT findings: none ABDOMEN/PELVIS: No  abnormal hypermetabolic activity within the liver, pancreas, adrenal glands, or spleen. No hypermetabolic lymph nodes in the abdomen or pelvis. Incidental CT findings: Tiny calcified gallstones evident. Moderate atherosclerotic calcification noted in the abdominal aorta without aneurysm. Prostate gland is enlarged. SKELETON: No focal hypermetabolic activity to suggest skeletal metastasis. Incidental CT findings: none IMPRESSION: 1. Interval development of a new 6 mm short axis right cervical node with hypermetabolism. Finding is somewhat indeterminate  given the apparent response of the hypermetabolic right paratracheal lymphadenopathy seen on the previous study. Close follow-up recommended as metastatic disease not excluded. 2. Diffuse hypermetabolic FDG accumulation noted in the esophagus today with more diffuse circumferential esophageal wall thickening on today's study than on the previous exam. Hypermetabolism in the distal esophagus at the level of the hypermetabolic neoplasm previously has decreased from SUV max = 13 to SUV max = 7 today. 3. Stable tiny 3-4 mm right lower lobe and left upper lobe pulmonary nodules. Continued attention on follow-up recommended. 4. Cholelithiasis. 5. Emphysema.  (ICD10-J43.9) 6.  Aortic Atherosclerois (ICD10-170.0) 7. Electronically Signed   By: Misty Stanley M.D.   On: 08/05/2021 09:59    Labs:  CBC: Recent Labs    06/28/21 1134 07/05/21 1044 07/18/21 1100 08/08/21 0829  WBC 3.0* 3.1* 4.8 3.3*  HGB 12.5* 11.1* 10.6* 11.7*  HCT 33.7* 30.7* 29.7* 32.9*  PLT 138* 129* 121* 103*    COAGS: No results for input(s): INR, APTT in the last 8760 hours.  BMP: Recent Labs    06/28/21 1134 07/05/21 1044 07/18/21 1100 08/08/21 0829  NA 135 139 139 141  K 3.4* 3.3* 4.0 3.6  CL 98 104 103 107  CO2 27 26 27 24   GLUCOSE 112* 162* 91 134*  BUN 22 23 19 10   CALCIUM 9.4 9.3 9.5 9.2  CREATININE 0.83 0.78 0.80 0.79  GFRNONAA >60 >60 >60 >60    LIVER FUNCTION  TESTS: Recent Labs    06/28/21 1134 07/05/21 1044 07/18/21 1100 08/08/21 0829  BILITOT 0.8 0.7 0.6 0.8  AST 16 19 31 23   ALT 24 25 39 20  ALKPHOS 64 68 77 83  PROT 6.9 6.5 6.7 6.4*  ALBUMIN 3.3* 3.2* 3.3* 3.3*    TUMOR MARKERS: No results for input(s): AFPTM, CEA, CA199, CHROMGRNA in the last 8760 hours.  Assessment and Plan:  Esophageal cancer Thyroid nodule and cervical lymph node hypermetabolic on PET 0/23/34 Scheduled yesterday for Thyroid nodule bx-- done Scheduled today for right cervical lymph node biopsy Risks and benefits of right cervical lymph node biopsy was discussed with the patient and/or patient's family including, but not limited to bleeding, infection, damage to adjacent structures or low yield requiring additional tests.  All of the questions were answered and there is agreement to proceed.  Consent signed and in chart.   Thank you for this interesting consult.  I greatly enjoyed meeting NISHAWN ROTAN and look forward to participating in their care.  A copy of this report was sent to the requesting provider on this date.  Electronically Signed: Lavonia Drafts, PA-C 08/17/2021, 11:45 AM   I spent a total of  30 Minutes   in face to face in clinical consultation, greater than 50% of which was counseling/coordinating care for right cervical LN bx

## 2021-08-18 LAB — CYTOLOGY - NON PAP

## 2021-08-19 ENCOUNTER — Encounter: Payer: Self-pay | Admitting: *Deleted

## 2021-08-19 ENCOUNTER — Other Ambulatory Visit: Payer: Self-pay

## 2021-08-19 ENCOUNTER — Other Ambulatory Visit: Payer: Self-pay | Admitting: *Deleted

## 2021-08-19 ENCOUNTER — Telehealth: Payer: Self-pay | Admitting: Hematology

## 2021-08-19 ENCOUNTER — Other Ambulatory Visit: Payer: Self-pay | Admitting: Thoracic Surgery (Cardiothoracic Vascular Surgery)

## 2021-08-19 ENCOUNTER — Ambulatory Visit (INDEPENDENT_AMBULATORY_CARE_PROVIDER_SITE_OTHER): Payer: BC Managed Care – PPO | Admitting: Thoracic Surgery (Cardiothoracic Vascular Surgery)

## 2021-08-19 VITALS — BP 121/79 | HR 87 | Resp 20 | Ht 66.0 in | Wt 144.0 lb

## 2021-08-19 DIAGNOSIS — C158 Malignant neoplasm of overlapping sites of esophagus: Secondary | ICD-10-CM

## 2021-08-19 DIAGNOSIS — C159 Malignant neoplasm of esophagus, unspecified: Secondary | ICD-10-CM

## 2021-08-19 MED FILL — Dexamethasone Sodium Phosphate Inj 100 MG/10ML: INTRAMUSCULAR | Qty: 1 | Status: AC

## 2021-08-19 NOTE — Progress Notes (Signed)
[address]       [place] [address]             [id number]                    Taevyn L Savidge [place] Medical Record #[id number] Date of Birth: 06/07/1956  Referring: Arta Silence, MD Primary Care: Cyndi Bender, PA-C Primary Cardiologist: None  Chief Complaint:    Chief Complaint  Patient presents with   Esophageal Cancer    2 week f/u    History of Present Illness:    AMRIT CRESS 65 y.o. male with history of poorly differentiated adenocarcinoma the distal esophagus comes in in follow-up after completion of his neoadjuvant therapy.  Overall he is doing well his weight has been stable.  He is very anxious to proceed with surgery.       Zubrod Score: At the time of surgery this patient's most appropriate activity status/level should be described as: [x]     0    Normal activity, no symptoms []     1    Restricted in physical strenuous activity but ambulatory, able to do out light work []     2    Ambulatory and capable of self care, unable to do work activities, up and about               >50 % of waking hours                              []     3    Only limited self care, in bed greater than 50% of waking hours []     4    Completely disabled, no self care, confined to bed or chair []     5    Moribund   Past Medical History:  Diagnosis Date   BPH (benign prostatic hyperplasia)    Dyslipidemia    Elevated PSA    Esophageal cancer ([place])    Full dentures    GERD (gastroesophageal reflux disease)    History of melanoma excision    07/ 2011  s/p  wide excision left back--- per pathology-- negative maligant --  mild melanocytic atypia   History of palpitations    cardiologist --  dr Shelva Majestic (last note in epic 2014   Hypertension    Sleep apnea     Past Surgical History:  Procedure Laterality Date   COLONOSCOPY  10/24/2007   ESOPHAGOGASTRODUODENOSCOPY (EGD) WITH PROPOFOL N/A 05/11/2021   Procedure: ESOPHAGOGASTRODUODENOSCOPY (EGD)  WITH PROPOFOL;  Surgeon: Arta Silence, MD;  Location: [facility];  Service: Endoscopy;  Laterality: N/A;   EUS N/A 05/11/2021   Procedure: ESOPHAGEAL ENDOSCOPIC ULTRASOUND (EUS) RADIAL;  Surgeon: Arta Silence, MD;  Location: [facility];  Service: Endoscopy;  Laterality: N/A;   EYE SURGERY     Lasik surgery on both eyes, but still needs glasses   FINE NEEDLE ASPIRATION  05/26/2021   Procedure: FINE NEEDLE ASPIRATION (FNA) LINEAR;  Surgeon: Garner Nash, DO;  Location: [facility];  Service: Pulmonary;;   IR IMAGING GUIDED PORT INSERTION  06/30/2021   PROSTATE BIOPSY N/A 07/02/2015   Procedure: BIOPSY TRANSRECTAL ULTRASONIC PROSTATE (TUBP);  Surgeon: Lowella Bandy, MD;  Location: [facility];  Service: Urology;  Laterality: N/A;   PROSTATE BIOPSY N/A 11/22/2015   Procedure: BIOPSY TRANSRECTAL ULTRASONIC PROSTATE (TUBP);  Surgeon: Cleon Gustin, MD;  Location: [facility];  Service: Urology;  Laterality: N/A;   TRANSTHORACIC ECHOCARDIOGRAM  11/19/2012   normal echo/  trivial TR   VIDEO BRONCHOSCOPY WITH ENDOBRONCHIAL ULTRASOUND N/A 05/26/2021   Procedure: VIDEO BRONCHOSCOPY WITH ENDOBRONCHIAL ULTRASOUND;  Surgeon: Garner Nash, DO;  Location: [facility];  Service: Pulmonary;  Laterality: N/A;   WIDE EXCISION MELANOMA LEFT BACK  05/02/2010    Family History  Problem Relation Age of Onset   Kidney failure Mother        died age 62   Pneumonia Father        died age 75     Social History   Tobacco Use  Smoking Status Former   Packs/day: 2.00   Years: 42.00   Pack years: 84.00   Types: Cigarettes   Quit date: 03/15/2011   Years since quitting: 10.4  Smokeless Tobacco Current   Types: Snuff  Tobacco Comments   occasional dip tobacco    Social History   Substance and Sexual Activity  Alcohol Use Yes   Alcohol/week: 2.0 standard drinks   Types: 2 Cans of beer per week   Comment: for 40 years     Allergies  Allergen Reactions    Bee Venom Anaphylaxis    Current Outpatient Medications  Medication Sig Dispense Refill   aspirin EC 81 MG tablet Take 81 mg by mouth in the morning.     HYDROcodone-acetaminophen (HYCET) 7.5-325 mg/15 ml solution Take 5-10 mLs by mouth every 4 (four) hours as needed for moderate pain. 473 mL 0   lidocaine (XYLOCAINE) 2 % solution Patient: Mix 1part 2% viscous lidocaine, 1part H20. Swallow 44mL of diluted mixture, 74min before meals and at bedtime, up to QID 200 mL 3   metoprolol succinate (TOPROL-XL) 50 MG 24 hr tablet Take 50 mg by mouth daily. Take with or immediately following a meal.     ondansetron (ZOFRAN) 8 MG tablet Take 8 mg by mouth every 8 (eight) hours as needed for nausea or vomiting.     pantoprazole (PROTONIX) 40 MG tablet Take 1 tablet (40 mg total) by mouth 2 (two) times daily. 30 tablet 1   sucralfate (CARAFATE) 1 g tablet Dissolve 1 tablet in 10 mL H20 and swallow up to QID prn soreness. 40 tablet 5   metoprolol succinate (TOPROL-XL) 100 MG 24 hr tablet Take 50 mg by mouth in the morning. (Patient not taking: Reported on 08/19/2021)     triamcinolone cream (KENALOG) 0.1 % Apply 1 application topically 2 (two) times daily as needed (irritation). (Patient not taking: Reported on 08/19/2021)     No current facility-administered medications for this visit.    Review of Systems  Constitutional: Negative.   Gastrointestinal:  Positive for heartburn. Negative for abdominal pain, nausea and vomiting.    PHYSICAL EXAMINATION: BP 121/79   Pulse 87   Resp 20   Ht 5\' 6"  (1.676 m)   Wt 144 lb (65.3 kg)   SpO2 98% Comment: RA  BMI 23.24 kg/m  Physical Exam Constitutional:      General: He is not in acute distress.    Appearance: He is normal weight. He is ill-appearing.  HENT:     Head: Normocephalic and atraumatic.  Cardiovascular:     Rate and Rhythm: Normal rate.  Pulmonary:     Effort: Pulmonary effort is normal. No respiratory distress.  Abdominal:     General:  Abdomen is flat.     Palpations: Abdomen is soft.  Musculoskeletal:  Cervical back: Normal range of motion.  Neurological:     Mental Status: He is alert.        I have independently reviewed the above radiology studies  and reviewed the findings with the patient.   Recent Lab Findings: Lab Results  Component Value Date   WBC 3.8 (L) 08/17/2021   HGB 11.5 (L) 08/17/2021   HCT 32.8 (L) 08/17/2021   PLT 84 (L) 08/17/2021   GLUCOSE 134 (H) 08/08/2021   CHOL 186 01/28/2008   TRIG 232 (H) 01/28/2008   HDL 38 (L) 01/28/2008   LDLCALC 102 (H) 01/28/2008   ALT 20 08/08/2021   AST 23 08/08/2021   NA 141 08/08/2021   K 3.6 08/08/2021   CL 107 08/08/2021   CREATININE 0.79 08/08/2021   BUN 10 08/08/2021   CO2 24 08/08/2021   TSH 0.872 05/17/2021   INR 1.0 08/17/2021   HGBA1C  11/22/2007    5.1 (NOTE)   The ADA recommends the following therapeutic goals for glycemic   control related to Hgb A1C measurement:   Goal of Therapy:   < 7.0% Hgb A1C   Action Suggested:  > 8.0% Hgb A1C   Ref:  Diabetes Care, 22, Suppl. 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  He has undergone neoadjuvant chemotherapy and has demonstrated regression and decreased avidity in these cervical nodes.  Additionally he underwent an ultrasound-guided biopsy of a new right-sided cervical lymph node, and the results were negative.  The PET/CT was concerning for diffuse uptake along the entire esophagus which could be due to a fungal infection given his recent bout with tongue candidiasis.  He is scheduled to undergo an EGD next week for confirmation of this.  Given the fact that his cervical lymph node was negative I think that he is safe to proceed with an EGD and robotic assisted Ivor Lewis esophagectomy with jejunostomy tube placement.  He is agreeable to proceed  and is scheduled for mid November.  He will undergo a stress test prior to surgery.     I  spent 15 minutes with the patient face to face counseling and coordination of care.    Lajuana Matte 08/19/2021 4:40 PM

## 2021-08-19 NOTE — H&P (View-Only) (Signed)
[address]       [place] [address]             [phone]                    [facility] Medical Record #[id number] Date of Birth: 02/09/1956  Referring: Arta Silence, MD Primary Care: Cyndi Bender, PA-C Primary Cardiologist: None  Chief Complaint:    Chief Complaint  Patient presents with   Esophageal Cancer    2 week f/u    History of Present Illness:    ROMELL WOLDEN 65 y.o. male with history of poorly differentiated adenocarcinoma the distal esophagus comes in in follow-up after completion of his neoadjuvant therapy.  Overall he is doing well his weight has been stable.  He is very anxious to proceed with surgery.       Zubrod Score: At the time of surgery this patient's most appropriate activity status/level should be described as: [x]     0    Normal activity, no symptoms []     1    Restricted in physical strenuous activity but ambulatory, able to do out light work []     2    Ambulatory and capable of self care, unable to do work activities, up and about               >50 % of waking hours                              []     3    Only limited self care, in bed greater than 50% of waking hours []     4    Completely disabled, no self care, confined to bed or chair []     5    Moribund   Past Medical History:  Diagnosis Date   BPH (benign prostatic hyperplasia)    Dyslipidemia    Elevated PSA    Esophageal cancer ([place])    Full dentures    GERD (gastroesophageal reflux disease)    History of melanoma excision    07/ 2011  s/p  wide excision left back--- per pathology-- negative maligant --  mild melanocytic atypia   History of palpitations    cardiologist --  dr Shelva Majestic (last note in epic 2014   Hypertension    Sleep apnea     Past Surgical History:  Procedure Laterality Date   COLONOSCOPY  10/24/2007   ESOPHAGOGASTRODUODENOSCOPY (EGD) WITH PROPOFOL N/A 05/11/2021   Procedure: ESOPHAGOGASTRODUODENOSCOPY (EGD)  WITH PROPOFOL;  Surgeon: Arta Silence, MD;  Location: [facility];  Service: Endoscopy;  Laterality: N/A;   EUS N/A 05/11/2021   Procedure: ESOPHAGEAL ENDOSCOPIC ULTRASOUND (EUS) RADIAL;  Surgeon: Arta Silence, MD;  Location: [facility];  Service: Endoscopy;  Laterality: N/A;   EYE SURGERY     Lasik surgery on both eyes, but still needs glasses   FINE NEEDLE ASPIRATION  05/26/2021   Procedure: FINE NEEDLE ASPIRATION (FNA) LINEAR;  Surgeon: Garner Nash, DO;  Location: [facility];  Service: Pulmonary;;   IR IMAGING GUIDED PORT INSERTION  06/30/2021   PROSTATE BIOPSY N/A 07/02/2015   Procedure: BIOPSY TRANSRECTAL ULTRASONIC PROSTATE (TUBP);  Surgeon: Lowella Bandy, MD;  Location: [facility];  Service: Urology;  Laterality: N/A;   PROSTATE BIOPSY N/A 11/22/2015   Procedure: BIOPSY TRANSRECTAL ULTRASONIC PROSTATE (TUBP);  Surgeon: Cleon Gustin, MD;  Location: [place];  Service: Urology;  Laterality: N/A;   TRANSTHORACIC ECHOCARDIOGRAM  11/19/2012   normal echo/  trivial TR   VIDEO BRONCHOSCOPY WITH ENDOBRONCHIAL ULTRASOUND N/A 05/26/2021   Procedure: VIDEO BRONCHOSCOPY WITH ENDOBRONCHIAL ULTRASOUND;  Surgeon: Garner Nash, DO;  Location: [place];  Service: Pulmonary;  Laterality: N/A;   WIDE EXCISION MELANOMA LEFT BACK  05/02/2010    Family History  Problem Relation Age of Onset   Kidney failure Mother        died age 81   Pneumonia Father        died age 99     Social History   Tobacco Use  Smoking Status Former   Packs/day: 2.00   Years: 42.00   Pack years: 84.00   Types: Cigarettes   Quit date: 03/15/2011   Years since quitting: 10.4  Smokeless Tobacco Current   Types: Snuff  Tobacco Comments   occasional dip tobacco    Social History   Substance and Sexual Activity  Alcohol Use Yes   Alcohol/week: 2.0 standard drinks   Types: 2 Cans of beer per week   Comment: for 40 years     Allergies  Allergen Reactions    Bee Venom Anaphylaxis    Current Outpatient Medications  Medication Sig Dispense Refill   aspirin EC 81 MG tablet Take 81 mg by mouth in the morning.     HYDROcodone-acetaminophen (HYCET) 7.5-325 mg/15 ml solution Take 5-10 mLs by mouth every 4 (four) hours as needed for moderate pain. 473 mL 0   lidocaine (XYLOCAINE) 2 % solution Patient: Mix 1part 2% viscous lidocaine, 1part H20. Swallow 47mL of diluted mixture, 45min before meals and at bedtime, up to QID 200 mL 3   metoprolol succinate (TOPROL-XL) 50 MG 24 hr tablet Take 50 mg by mouth daily. Take with or immediately following a meal.     ondansetron (ZOFRAN) 8 MG tablet Take 8 mg by mouth every 8 (eight) hours as needed for nausea or vomiting.     pantoprazole (PROTONIX) 40 MG tablet Take 1 tablet (40 mg total) by mouth 2 (two) times daily. 30 tablet 1   sucralfate (CARAFATE) 1 g tablet Dissolve 1 tablet in 10 mL H20 and swallow up to QID prn soreness. 40 tablet 5   metoprolol succinate (TOPROL-XL) 100 MG 24 hr tablet Take 50 mg by mouth in the morning. (Patient not taking: Reported on 08/19/2021)     triamcinolone cream (KENALOG) 0.1 % Apply 1 application topically 2 (two) times daily as needed (irritation). (Patient not taking: Reported on 08/19/2021)     No current facility-administered medications for this visit.    Review of Systems  Constitutional: Negative.   Gastrointestinal:  Positive for heartburn. Negative for abdominal pain, nausea and vomiting.    PHYSICAL EXAMINATION: BP 121/79   Pulse 87   Resp 20   Ht 5\' 6"  (1.676 m)   Wt 144 lb (65.3 kg)   SpO2 98% Comment: RA  BMI 23.24 kg/m  Physical Exam Constitutional:      General: He is not in acute distress.    Appearance: He is normal weight. He is ill-appearing.  HENT:     Head: Normocephalic and atraumatic.  Cardiovascular:     Rate and Rhythm: Normal rate.  Pulmonary:     Effort: Pulmonary effort is normal. No respiratory distress.  Abdominal:     General:  Abdomen is flat.     Palpations: Abdomen is soft.  Musculoskeletal:  Cervical back: Normal range of motion.  Neurological:     Mental Status: He is alert.        I have independently reviewed the above radiology studies  and reviewed the findings with the patient.   Recent Lab Findings: Lab Results  Component Value Date   WBC 3.8 (L) 08/17/2021   HGB 11.5 (L) 08/17/2021   HCT 32.8 (L) 08/17/2021   PLT 84 (L) 08/17/2021   GLUCOSE 134 (H) 08/08/2021   CHOL 186 01/28/2008   TRIG 232 (H) 01/28/2008   HDL 38 (L) 01/28/2008   LDLCALC 102 (H) 01/28/2008   ALT 20 08/08/2021   AST 23 08/08/2021   NA 141 08/08/2021   K 3.6 08/08/2021   CL 107 08/08/2021   CREATININE 0.79 08/08/2021   BUN 10 08/08/2021   CO2 24 08/08/2021   TSH 0.872 05/17/2021   INR 1.0 08/17/2021   HGBA1C  11/22/2007    5.1 (NOTE)   The ADA recommends the following therapeutic goals for glycemic   control related to Hgb A1C measurement:   Goal of Therapy:   < 7.0% Hgb A1C   Action Suggested:  > 8.0% Hgb A1C   Ref:  Diabetes Care, 22, Suppl. 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  He has undergone neoadjuvant chemotherapy and has demonstrated regression and decreased avidity in these cervical nodes.  Additionally he underwent an ultrasound-guided biopsy of a new right-sided cervical lymph node, and the results were negative.  The PET/CT was concerning for diffuse uptake along the entire esophagus which could be due to a fungal infection given his recent bout with tongue candidiasis.  He is scheduled to undergo an EGD next week for confirmation of this.  Given the fact that his cervical lymph node was negative I think that he is safe to proceed with an EGD and robotic assisted Ivor Lewis esophagectomy with jejunostomy tube placement.  He is agreeable to proceed  and is scheduled for mid November.  He will undergo a stress test prior to surgery.     I  spent 15 minutes with the patient face to face counseling and coordination of care.    Lajuana Matte 08/19/2021 4:40 PM

## 2021-08-19 NOTE — Telephone Encounter (Signed)
Pt's node biopsy was negative, was seen by Dr. Kipp Brood today and scheduled for surgery 11/14. I will cancel his chemo on 10/31 and plan to see him 3 weeks after surgery. Schedule message sent. I spoke with pt.   Nathaniel Jennings  08/19/2021

## 2021-08-22 ENCOUNTER — Telehealth: Payer: Self-pay | Admitting: Nutrition

## 2021-08-22 ENCOUNTER — Inpatient Hospital Stay: Payer: BC Managed Care – PPO

## 2021-08-22 ENCOUNTER — Inpatient Hospital Stay: Payer: BC Managed Care – PPO | Admitting: Nurse Practitioner

## 2021-08-22 ENCOUNTER — Inpatient Hospital Stay: Payer: BC Managed Care – PPO | Admitting: Nutrition

## 2021-08-22 ENCOUNTER — Telehealth: Payer: Self-pay | Admitting: Hematology

## 2021-08-22 NOTE — Telephone Encounter (Signed)
Nutrition follow-up completed over the telephone for esophageal cancer. Patient confirms he is scheduled for esophagectomy with J-tube placement somewhere around mid November.  Reports he is tolerating most foods without difficulty.  He is drinking 2 nutrition supplements a day. Weight has decreased to 144 pounds October 28.  This is decreased from 180 pounds in April 2022.  This is a 20% weight loss over 6 months which is significant. Patient denies nausea and vomiting.  No other nutrition impact symptoms revealed.  Nutrition diagnosis: Unintended weight loss continues.  Intervention: Educated patient on strategies for improving overall nutrition status and minimizing weight loss prior to surgery in several weeks.   Recommended increase oral nutrition supplements to Ensure Plus or equivalent 3 times daily. Continue small amounts of soft food and liquids often throughout the day. Monitor weight and strive for 1 pound weight gain per week.   Patient understands he may have to have a jejunostomy feeding tube placed after esophagectomy.  I have encouraged him to reach out if he must go home on jejunostomy feedings.  He should also receive tube feeding education in the hospital. Teach back method used.  Patient has contact information.  Monitoring, evaluation, goals: Patient will tolerate increased calories and protein to promote slow weight gain of 1 pound per week.  Next visit: To be scheduled.  **Disclaimer: This note was dictated with voice recognition software. Similar sounding words can inadvertently be transcribed and this note may contain transcription errors which may not have been corrected upon publication of note.**

## 2021-08-22 NOTE — Telephone Encounter (Signed)
Scheduled per 10/28 in basket, pt has been called and confirmed

## 2021-08-23 ENCOUNTER — Other Ambulatory Visit: Payer: Self-pay | Admitting: Thoracic Surgery (Cardiothoracic Vascular Surgery)

## 2021-08-23 ENCOUNTER — Encounter: Payer: Self-pay | Admitting: Hematology

## 2021-08-23 DIAGNOSIS — C159 Malignant neoplasm of esophagus, unspecified: Secondary | ICD-10-CM

## 2021-08-23 DIAGNOSIS — D225 Melanocytic nevi of trunk: Secondary | ICD-10-CM | POA: Diagnosis not present

## 2021-08-23 DIAGNOSIS — L821 Other seborrheic keratosis: Secondary | ICD-10-CM | POA: Diagnosis not present

## 2021-08-23 DIAGNOSIS — L309 Dermatitis, unspecified: Secondary | ICD-10-CM | POA: Diagnosis not present

## 2021-08-23 DIAGNOSIS — Z23 Encounter for immunization: Secondary | ICD-10-CM | POA: Diagnosis not present

## 2021-08-23 DIAGNOSIS — L814 Other melanin hyperpigmentation: Secondary | ICD-10-CM | POA: Diagnosis not present

## 2021-08-23 DIAGNOSIS — L570 Actinic keratosis: Secondary | ICD-10-CM | POA: Diagnosis not present

## 2021-08-23 NOTE — Progress Notes (Signed)
                                                                                                                                                             Patient Name: Nathaniel Jennings MRN: [id number] DOB: 1956-04-06 Referring Physician: [name] (Profile Not Attached) Date of Service: 07/04/2021 [facility] [place]                                                        End Of Treatment Note  Diagnoses: C15.8-Malignant neoplasm of overlapping sites of esophagus  Cancer Staging: Cancer Staging Malignant neoplasm of overlapping sites of esophagus [facility]) Staging form: Esophagus - Adenocarcinoma, AJCC 8th Edition - Clinical stage from 05/13/2021: Stage IVA (cT3, cN2, cM0) - Signed by [name], MD on 05/22/2021  Intent: Curative/pre-op  Radiation Treatment Dates: 05/24/2021 through 07/04/2021 Site Technique Total Dose (Gy) Dose per Fx (Gy) Completed Fx Beam Energies  Esophagus: Esoph_ IMRT 45/45 1.8 25/25 6X  Esophagus: Esoph_Bst IMRT 5.4/5.4 1.8 3/3 6X   Narrative: The patient tolerated radiation therapy relatively well.   Plan: The patient will follow-up with radiation oncology in 62mo. -----------------------------------  Eppie Gibson, MD

## 2021-08-24 ENCOUNTER — Inpatient Hospital Stay: Payer: BC Managed Care – PPO

## 2021-08-25 ENCOUNTER — Other Ambulatory Visit: Payer: Self-pay | Admitting: Thoracic Surgery (Cardiothoracic Vascular Surgery)

## 2021-08-25 ENCOUNTER — Telehealth (HOSPITAL_COMMUNITY): Payer: Self-pay | Admitting: *Deleted

## 2021-08-25 ENCOUNTER — Encounter: Payer: Self-pay | Admitting: Hematology

## 2021-08-25 DIAGNOSIS — R079 Chest pain, unspecified: Secondary | ICD-10-CM

## 2021-08-25 DIAGNOSIS — Z01818 Encounter for other preprocedural examination: Secondary | ICD-10-CM

## 2021-08-25 NOTE — Telephone Encounter (Signed)
Left message on voicemail per DPR in reference to upcoming appointment scheduled on 08/29/21 at 10:15 with detailed instructions given per Myocardial Perfusion Study Information Sheet for the test. LM to arrive 15 minutes early, and that it is imperative to arrive on time for appointment to keep from having the test rescheduled. If you need to cancel or reschedule your appointment, please call the office within 24 hours of your appointment. Failure to do so may result in a cancellation of your appointment, and a $50 no show fee. Phone number given for call back for any questions.

## 2021-08-29 ENCOUNTER — Other Ambulatory Visit: Payer: Self-pay

## 2021-08-29 ENCOUNTER — Ambulatory Visit (HOSPITAL_COMMUNITY): Payer: BC Managed Care – PPO | Attending: Thoracic Surgery (Cardiothoracic Vascular Surgery)

## 2021-08-29 DIAGNOSIS — R079 Chest pain, unspecified: Secondary | ICD-10-CM | POA: Insufficient documentation

## 2021-08-29 DIAGNOSIS — Z01810 Encounter for preprocedural cardiovascular examination: Secondary | ICD-10-CM

## 2021-08-29 DIAGNOSIS — Z01818 Encounter for other preprocedural examination: Secondary | ICD-10-CM | POA: Diagnosis not present

## 2021-08-29 LAB — MYOCARDIAL PERFUSION IMAGING
Base ST Depression (mm): 0 mm
LV dias vol: 81 mL (ref 62–150)
LV sys vol: 37 mL
Nuc Stress EF: 54 %
Peak HR: 100 {beats}/min
Rest HR: 60 {beats}/min
Rest Nuclear Isotope Dose: 10.9 mCi
SDS: 3
SRS: 0
SSS: 3
ST Depression (mm): 0 mm
Stress Nuclear Isotope Dose: 30.9 mCi
TID: 1.03

## 2021-08-29 MED ORDER — TECHNETIUM TC 99M TETROFOSMIN IV KIT
30.9000 | PACK | Freq: Once | INTRAVENOUS | Status: AC | PRN
  Administered 2021-08-29: 30.9 via INTRAVENOUS
  Filled 2021-08-29: qty 31

## 2021-08-29 MED ORDER — TECHNETIUM TC 99M TETROFOSMIN IV KIT
10.9000 | PACK | Freq: Once | INTRAVENOUS | Status: AC | PRN
  Administered 2021-08-29: 10.9 via INTRAVENOUS
  Filled 2021-08-29: qty 11

## 2021-08-29 MED ORDER — REGADENOSON 0.4 MG/5ML IV SOLN
0.4000 mg | Freq: Once | INTRAVENOUS | Status: AC
  Administered 2021-08-29: 0.4 mg via INTRAVENOUS

## 2021-08-30 DIAGNOSIS — G4730 Sleep apnea, unspecified: Secondary | ICD-10-CM | POA: Diagnosis not present

## 2021-08-30 DIAGNOSIS — K2210 Ulcer of esophagus without bleeding: Secondary | ICD-10-CM | POA: Diagnosis not present

## 2021-08-30 DIAGNOSIS — K2289 Other specified disease of esophagus: Secondary | ICD-10-CM | POA: Diagnosis not present

## 2021-08-30 DIAGNOSIS — K2930 Chronic superficial gastritis without bleeding: Secondary | ICD-10-CM | POA: Diagnosis not present

## 2021-08-30 DIAGNOSIS — K317 Polyp of stomach and duodenum: Secondary | ICD-10-CM | POA: Diagnosis not present

## 2021-08-31 DIAGNOSIS — K317 Polyp of stomach and duodenum: Secondary | ICD-10-CM | POA: Diagnosis not present

## 2021-08-31 DIAGNOSIS — K208 Other esophagitis without bleeding: Secondary | ICD-10-CM | POA: Diagnosis not present

## 2021-08-31 DIAGNOSIS — Z8501 Personal history of malignant neoplasm of esophagus: Secondary | ICD-10-CM | POA: Diagnosis not present

## 2021-08-31 DIAGNOSIS — K449 Diaphragmatic hernia without obstruction or gangrene: Secondary | ICD-10-CM | POA: Diagnosis not present

## 2021-08-31 DIAGNOSIS — K209 Esophagitis, unspecified without bleeding: Secondary | ICD-10-CM | POA: Diagnosis not present

## 2021-08-31 DIAGNOSIS — K2930 Chronic superficial gastritis without bleeding: Secondary | ICD-10-CM | POA: Diagnosis not present

## 2021-08-31 DIAGNOSIS — K2289 Other specified disease of esophagus: Secondary | ICD-10-CM | POA: Diagnosis not present

## 2021-09-01 ENCOUNTER — Other Ambulatory Visit: Payer: Self-pay | Admitting: *Deleted

## 2021-09-01 DIAGNOSIS — C159 Malignant neoplasm of esophagus, unspecified: Secondary | ICD-10-CM

## 2021-09-01 NOTE — Pre-Procedure Instructions (Signed)
Surgical Instructions    Your procedure is scheduled on Monday 09/05/21.   Report to [facility] Main Entrance "A" at 05:30 A.M., then check in with the Admitting office.  Call this number if you have problems the morning of surgery:  [phone]   If you have any questions prior to your surgery date call [phone]: Open Monday-Friday 8am-4pm    Remember:  Do not eat or drink after midnight the night before your surgery     Take these medicines the morning of surgery with A SIP OF WATER   metoprolol succinate (TOPROL-XL)  pantoprazole (PROTONIX)   Take these medicines if needed:   HYDROcodone-acetaminophen (HYCET)    Please follow your surgeon's instructions regarding Aspirin. If you have not received instructions then you need to contact your surgeon's office for instructions.   As of today, STOP taking any Aspirin (unless otherwise instructed by your surgeon) Aleve, Naproxen, Ibuprofen, Motrin, Advil, Goody's, BC's, all herbal medications, fish oil, and all vitamins.     After your COVID test   You are not required to quarantine however you are required to wear a well-fitting mask when you are out and around people not in your household.  If your mask becomes wet or soiled, replace with a new one.  Wash your hands often with soap and water for 20 seconds or clean your hands with an alcohol-based hand sanitizer that contains at least 60% alcohol.  Do not share personal items.  Notify your provider: if you are in close contact with someone who has COVID  or if you develop a fever of 100.4 or greater, sneezing, cough, sore throat, shortness of breath or body aches.             Do not wear jewelry or makeup Do not wear lotions, powders, perfumes/colognes, or deodorant. Do not shave 48 hours prior to surgery.  Men may shave face and neck. Do not bring valuables to the hospital. DO Not wear nail polish, gel polish, artificial nails, or any other type of covering on  natural nails including finger and toenails. If patients have artificial nails, gel coating, etc. that need to be removed by a nail salon, please have this removed prior to surgery or surgery may need to be canceled/delayed if the surgeon/ anesthesia feels like the patient is unable to be adequately monitored.             [facility] is not responsible for any belongings or valuables.  Do NOT Smoke (Tobacco/Vaping)  24 hours prior to your procedure  If you use a CPAP at night, you may bring your mask for your overnight stay.   Contacts, glasses, hearing aids, dentures or partials may not be worn into surgery, please bring cases for these belongings   For patients admitted to the hospital, discharge time will be determined by your treatment team.   Patients discharged the day of surgery will not be allowed to drive home, and someone needs to stay with them for 24 hours.  NO VISITORS WILL BE ALLOWED IN PRE-OP WHERE PATIENTS ARE PREPPED FOR SURGERY.  ONLY 1 SUPPORT PERSON MAY BE PRESENT IN THE WAITING ROOM WHILE YOU ARE IN SURGERY.  IF YOU ARE TO BE ADMITTED, ONCE YOU ARE IN YOUR ROOM YOU WILL BE ALLOWED TWO (2) VISITORS. 1 (ONE) VISITOR MAY STAY OVERNIGHT BUT MUST ARRIVE TO THE ROOM BY 8pm.  Minor children may have two parents present. Special consideration for safety and communication needs will be  reviewed on a case by case basis.  Special instructions:    Oral Hygiene is also important to reduce your risk of infection.  Remember - BRUSH YOUR TEETH THE MORNING OF SURGERY WITH YOUR REGULAR TOOTHPASTE   [facility]- Preparing For Surgery  Before surgery, you can play an important role. Because skin is not sterile, your skin needs to be as free of germs as possible. You can reduce the number of germs on your skin by washing with CHG (chlorahexidine gluconate) Soap before surgery.  CHG is an antiseptic cleaner which kills germs and bonds with the skin to continue killing germs even after washing.      Please do not use if you have an allergy to CHG or antibacterial soaps. If your skin becomes reddened/irritated stop using the CHG.  Do not shave (including legs and underarms) for at least 48 hours prior to first CHG shower. It is OK to shave your face.  Please follow these instructions carefully.     Shower the NIGHT BEFORE SURGERY and the MORNING OF SURGERY with CHG Soap.   If you chose to wash your hair, wash your hair first as usual with your normal shampoo. After you shampoo, rinse your hair and body thoroughly to remove the shampoo.  Then [employer] and genitals (private parts) with your normal soap and rinse thoroughly to remove soap.  After that Use CHG Soap as you would any other liquid soap. You can apply CHG directly to the skin and wash gently with a scrungie or a clean washcloth.   Apply the CHG Soap to your body ONLY FROM THE NECK DOWN.  Do not use on open wounds or open sores. Avoid contact with your eyes, ears, mouth and genitals (private parts). Wash Face and genitals (private parts)  with your normal soap.   Wash thoroughly, paying special attention to the area where your surgery will be performed.  Thoroughly rinse your body with warm water from the neck down.  DO NOT shower/wash with your normal soap after using and rinsing off the CHG Soap.  Pat yourself dry with a CLEAN TOWEL.  Wear CLEAN PAJAMAS to bed the night before surgery  Place CLEAN SHEETS on your bed the night before your surgery  DO NOT SLEEP WITH PETS.   Day of Surgery:  Take a shower with CHG soap. Wear Clean/Comfortable clothing the morning of surgery Do not apply any deodorants/lotions.   Remember to brush your teeth WITH YOUR REGULAR TOOTHPASTE.   Please read over the following fact sheets that you were given.

## 2021-09-02 ENCOUNTER — Encounter (HOSPITAL_COMMUNITY): Payer: Self-pay

## 2021-09-02 ENCOUNTER — Ambulatory Visit (HOSPITAL_COMMUNITY)
Admission: RE | Admit: 2021-09-02 | Discharge: 2021-09-02 | Disposition: A | Discharge status: Home / Self Care | Payer: BC Managed Care – PPO | Source: Ambulatory Visit | Attending: Thoracic Surgery (Cardiothoracic Vascular Surgery) | Admitting: Thoracic Surgery (Cardiothoracic Vascular Surgery)

## 2021-09-02 ENCOUNTER — Other Ambulatory Visit: Payer: Self-pay

## 2021-09-02 ENCOUNTER — Other Ambulatory Visit: Payer: Self-pay | Admitting: *Deleted

## 2021-09-02 ENCOUNTER — Encounter (HOSPITAL_COMMUNITY)
Admission: RE | Admit: 2021-09-02 | Discharge: 2021-09-02 | Disposition: A | Discharge status: Home / Self Care | Payer: BC Managed Care – PPO | Source: Ambulatory Visit | Attending: Thoracic Surgery (Cardiothoracic Vascular Surgery) | Admitting: Thoracic Surgery (Cardiothoracic Vascular Surgery)

## 2021-09-02 VITALS — BP 117/79 | HR 72 | Temp 98.5°F | Resp 17 | Ht 66.0 in | Wt 149.0 lb

## 2021-09-02 DIAGNOSIS — Z923 Personal history of irradiation: Secondary | ICD-10-CM | POA: Diagnosis not present

## 2021-09-02 DIAGNOSIS — C159 Malignant neoplasm of esophagus, unspecified: Secondary | ICD-10-CM | POA: Diagnosis not present

## 2021-09-02 DIAGNOSIS — C770 Secondary and unspecified malignant neoplasm of lymph nodes of head, face and neck: Secondary | ICD-10-CM | POA: Diagnosis not present

## 2021-09-02 DIAGNOSIS — Z79899 Other long term (current) drug therapy: Secondary | ICD-10-CM | POA: Diagnosis not present

## 2021-09-02 DIAGNOSIS — R079 Chest pain, unspecified: Secondary | ICD-10-CM | POA: Diagnosis not present

## 2021-09-02 DIAGNOSIS — Z9889 Other specified postprocedural states: Secondary | ICD-10-CM | POA: Diagnosis not present

## 2021-09-02 DIAGNOSIS — J9811 Atelectasis: Secondary | ICD-10-CM | POA: Diagnosis not present

## 2021-09-02 DIAGNOSIS — E785 Hyperlipidemia, unspecified: Secondary | ICD-10-CM | POA: Diagnosis not present

## 2021-09-02 DIAGNOSIS — Z85820 Personal history of malignant melanoma of skin: Secondary | ICD-10-CM | POA: Diagnosis not present

## 2021-09-02 DIAGNOSIS — E860 Dehydration: Secondary | ICD-10-CM | POA: Diagnosis not present

## 2021-09-02 DIAGNOSIS — R197 Diarrhea, unspecified: Secondary | ICD-10-CM | POA: Diagnosis not present

## 2021-09-02 DIAGNOSIS — J9859 Other diseases of mediastinum, not elsewhere classified: Secondary | ICD-10-CM | POA: Diagnosis not present

## 2021-09-02 DIAGNOSIS — D62 Acute posthemorrhagic anemia: Secondary | ICD-10-CM | POA: Diagnosis not present

## 2021-09-02 DIAGNOSIS — Z6823 Body mass index (BMI) 23.0-23.9, adult: Secondary | ICD-10-CM | POA: Diagnosis not present

## 2021-09-02 DIAGNOSIS — K219 Gastro-esophageal reflux disease without esophagitis: Secondary | ICD-10-CM | POA: Diagnosis not present

## 2021-09-02 DIAGNOSIS — Z01818 Encounter for other preprocedural examination: Secondary | ICD-10-CM | POA: Insufficient documentation

## 2021-09-02 DIAGNOSIS — Z20822 Contact with and (suspected) exposure to covid-19: Secondary | ICD-10-CM | POA: Insufficient documentation

## 2021-09-02 DIAGNOSIS — N400 Enlarged prostate without lower urinary tract symptoms: Secondary | ICD-10-CM | POA: Diagnosis not present

## 2021-09-02 DIAGNOSIS — J982 Interstitial emphysema: Secondary | ICD-10-CM | POA: Diagnosis not present

## 2021-09-02 DIAGNOSIS — R000 Tachycardia, unspecified: Secondary | ICD-10-CM | POA: Diagnosis not present

## 2021-09-02 DIAGNOSIS — Z91030 Bee allergy status: Secondary | ICD-10-CM | POA: Diagnosis not present

## 2021-09-02 DIAGNOSIS — I10 Essential (primary) hypertension: Secondary | ICD-10-CM | POA: Diagnosis not present

## 2021-09-02 DIAGNOSIS — E440 Moderate protein-calorie malnutrition: Secondary | ICD-10-CM | POA: Diagnosis not present

## 2021-09-02 DIAGNOSIS — E871 Hypo-osmolality and hyponatremia: Secondary | ICD-10-CM | POA: Diagnosis not present

## 2021-09-02 DIAGNOSIS — Z452 Encounter for adjustment and management of vascular access device: Secondary | ICD-10-CM | POA: Diagnosis not present

## 2021-09-02 DIAGNOSIS — R1310 Dysphagia, unspecified: Secondary | ICD-10-CM | POA: Diagnosis not present

## 2021-09-02 DIAGNOSIS — C155 Malignant neoplasm of lower third of esophagus: Secondary | ICD-10-CM | POA: Diagnosis not present

## 2021-09-02 DIAGNOSIS — I959 Hypotension, unspecified: Secondary | ICD-10-CM | POA: Diagnosis not present

## 2021-09-02 DIAGNOSIS — J90 Pleural effusion, not elsewhere classified: Secondary | ICD-10-CM | POA: Diagnosis not present

## 2021-09-02 DIAGNOSIS — Z9221 Personal history of antineoplastic chemotherapy: Secondary | ICD-10-CM | POA: Diagnosis not present

## 2021-09-02 DIAGNOSIS — G4730 Sleep apnea, unspecified: Secondary | ICD-10-CM | POA: Diagnosis not present

## 2021-09-02 DIAGNOSIS — Z7982 Long term (current) use of aspirin: Secondary | ICD-10-CM | POA: Diagnosis not present

## 2021-09-02 DIAGNOSIS — Z87891 Personal history of nicotine dependence: Secondary | ICD-10-CM | POA: Diagnosis not present

## 2021-09-02 DIAGNOSIS — I517 Cardiomegaly: Secondary | ICD-10-CM | POA: Diagnosis not present

## 2021-09-02 HISTORY — DX: Unspecified osteoarthritis, unspecified site: M19.90

## 2021-09-02 LAB — COMPREHENSIVE METABOLIC PANEL
ALT: 17 U/L (ref 0–44)
AST: 26 U/L (ref 15–41)
Albumin: 3.1 g/dL — ABNORMAL LOW (ref 3.5–5.0)
Alkaline Phosphatase: 80 U/L (ref 38–126)
Anion gap: 6 (ref 5–15)
BUN: 7 mg/dL — ABNORMAL LOW (ref 8–23)
CO2: 22 mmol/L (ref 22–32)
Calcium: 8.8 mg/dL — ABNORMAL LOW (ref 8.9–10.3)
Chloride: 110 mmol/L (ref 98–111)
Creatinine, Ser: 0.64 mg/dL (ref 0.61–1.24)
GFR, Estimated: >60 mL/min (ref >60)
Glucose, Bld: 117 mg/dL — ABNORMAL HIGH (ref 70–99)
Potassium: 3.5 mmol/L (ref 3.5–5.1)
Sodium: 138 mmol/L (ref 135–145)
Total Bilirubin: 0.5 mg/dL (ref 0.3–1.2)
Total Protein: 5.7 g/dL — ABNORMAL LOW (ref 6.5–8.1)

## 2021-09-02 LAB — BLOOD GAS, ARTERIAL
Acid-base deficit: 0.1 mmol/L (ref 0.0–2.0)
Bicarbonate: 23.9 mmol/L (ref 20.0–28.0)
Drawn by: 602861
FIO2: 21
O2 Saturation: 98.9 %
Patient temperature: 37
pCO2 arterial: 38.6 mmHg (ref 32.0–48.0)
pH, Arterial: 7.409 (ref 7.350–7.450)
pO2, Arterial: 106 mmHg (ref 83.0–108.0)

## 2021-09-02 LAB — URINALYSIS, ROUTINE W REFLEX MICROSCOPIC
Bilirubin Urine: NEGATIVE
Glucose, UA: 50 mg/dL — AB
Hgb urine dipstick: NEGATIVE
Ketones, ur: NEGATIVE mg/dL
Leukocytes,Ua: NEGATIVE
Nitrite: NEGATIVE
Protein, ur: NEGATIVE mg/dL
Specific Gravity, Urine: 1.017 (ref 1.005–1.030)
pH: 5 (ref 5.0–8.0)

## 2021-09-02 LAB — CBC
HCT: 35 % — ABNORMAL LOW (ref 39.0–52.0)
Hemoglobin: 12.1 g/dL — ABNORMAL LOW (ref 13.0–17.0)
MCH: 35.5 pg — ABNORMAL HIGH (ref 26.0–34.0)
MCHC: 34.6 g/dL (ref 30.0–36.0)
MCV: 102.6 fL — ABNORMAL HIGH (ref 80.0–100.0)
Platelets: 116 10*3/uL — ABNORMAL LOW (ref 150–400)
RBC: 3.41 MIL/uL — ABNORMAL LOW (ref 4.22–5.81)
RDW: 13.1 % (ref 11.5–15.5)
WBC: 2.6 10*3/uL — ABNORMAL LOW (ref 4.0–10.5)
nRBC: 0 % (ref 0.0–0.2)

## 2021-09-02 LAB — APTT: aPTT: 30 seconds (ref 24–36)

## 2021-09-02 LAB — SARS CORONAVIRUS 2 (TAT 6-24 HRS): SARS Coronavirus 2: NEGATIVE

## 2021-09-02 LAB — PROTIME-INR
INR: 1 (ref 0.8–1.2)
Prothrombin Time: 12.7 seconds (ref 11.4–15.2)

## 2021-09-02 MED ORDER — PEG 3350-KCL-NABCB-NACL-NASULF 236 G PO SOLR: 4000.0000 mL | Freq: Once | ORAL | 0 refills | Status: AC

## 2021-09-02 MED ORDER — SODIUM CHLORIDE 0.9 % IV SOLN
2.0000 g | INTRAVENOUS | Status: AC
  Administered 2021-09-05 (×3): 2 g via INTRAVENOUS
  Filled 2021-09-02 (×3): qty 2

## 2021-09-02 NOTE — Pre-Procedure Instructions (Signed)
Surgical Instructions    Your procedure is scheduled on Monday 09/05/21.   Report to [facility] Main Entrance "A" at 05:30 A.M., then check in with the Admitting office.  Call this number if you have problems the morning of surgery:  [phone]   If you have any questions prior to your surgery date call [phone]: Open Monday-Friday 8am-4pm    Remember:  Do not eat or drink after midnight the night before your surgery     Take these medicines the morning of surgery with A SIP OF WATER   metoprolol succinate (TOPROL-XL)  pantoprazole (PROTONIX)   Take these medicines if needed:   HYDROcodone-acetaminophen (HYCET)    Please follow your surgeon's instructions regarding Aspirin. If you have not received instructions then you need to contact your surgeon's office for instructions.   As of today, STOP taking Aleve, Naproxen, Ibuprofen, Motrin, Advil, Goody's, BC's, all herbal medications, fish oil, and all vitamins.   DAY OF SURGERY         Do not wear jewelry  Do not wear lotions, powders, colognes, or deodorant. Men may shave face and neck. Do not bring valuables to the hospital.             [facility] is not responsible for any belongings or valuables.  Do NOT Smoke (Tobacco/Vaping)  24 hours prior to your procedure  If you use a CPAP at night, you may bring your mask for your overnight stay.   Contacts, glasses, hearing aids, dentures or partials may not be worn into surgery, please bring cases for these belongings   For patients admitted to the hospital, discharge time will be determined by your treatment team.   Patients discharged the day of surgery will not be allowed to drive home, and someone needs to stay with them for 24 hours.  NO VISITORS WILL BE ALLOWED IN PRE-OP WHERE PATIENTS ARE PREPPED FOR SURGERY.  ONLY 1 SUPPORT PERSON MAY BE PRESENT IN THE WAITING ROOM WHILE YOU ARE IN SURGERY.  IF YOU ARE TO BE ADMITTED, ONCE YOU ARE IN YOUR ROOM YOU WILL BE  ALLOWED TWO (2) VISITORS. 1 (ONE) VISITOR MAY STAY OVERNIGHT BUT MUST ARRIVE TO THE ROOM BY 8pm.  Minor children may have two parents present. Special consideration for safety and communication needs will be reviewed on a case by case basis.  Special instructions:    Oral Hygiene is also important to reduce your risk of infection.  Remember - BRUSH YOUR TEETH THE MORNING OF SURGERY WITH YOUR REGULAR TOOTHPASTE   [place]- Preparing For Surgery  Before surgery, you can play an important role. Because skin is not sterile, your skin needs to be as free of germs as possible. You can reduce the number of germs on your skin by washing with CHG (chlorahexidine gluconate) Soap before surgery.  CHG is an antiseptic cleaner which kills germs and bonds with the skin to continue killing germs even after washing.     Please do not use if you have an allergy to CHG or antibacterial soaps. If your skin becomes reddened/irritated stop using the CHG.  Do not shave (including legs and underarms) for at least 48 hours prior to first CHG shower. It is OK to shave your face.  Please follow these instructions carefully.     Shower the NIGHT BEFORE SURGERY and the MORNING OF SURGERY with CHG Soap.   If you chose to wash your hair, wash your hair first as usual with your  normal shampoo. After you shampoo, rinse your hair and body thoroughly to remove the shampoo.  Then [employer] and genitals (private parts) with your normal soap and rinse thoroughly to remove soap.  After that Use CHG Soap as you would any other liquid soap. You can apply CHG directly to the skin and wash gently with a scrungie or a clean washcloth.   Apply the CHG Soap to your body ONLY FROM THE NECK DOWN.  Do not use on open wounds or open sores. Avoid contact with your eyes, ears, mouth and genitals (private parts). Wash Face and genitals (private parts)  with your normal soap.   Wash thoroughly, paying special attention to the area where  your surgery will be performed.  Thoroughly rinse your body with warm water from the neck down.  DO NOT shower/wash with your normal soap after using and rinsing off the CHG Soap.  Pat yourself dry with a CLEAN TOWEL.  Wear CLEAN PAJAMAS to bed the night before surgery  Place CLEAN SHEETS on your bed the night before your surgery  DO NOT SLEEP WITH PETS.   Day of Surgery:  Take a shower with CHG soap. Wear Clean/Comfortable clothing the morning of surgery Do not apply any deodorants/lotions.   Remember to brush your teeth WITH YOUR REGULAR TOOTHPASTE.   Please read over the following fact sheets that you were given.

## 2021-09-02 NOTE — Progress Notes (Signed)
PCP - Cyndi Bender Cardiologist - denies  Chest x-ray - 09/02/21 EKG - 09/02/21 Stress Test - 08/29/21 ECHO - 11/19/2012  SA - yes, does not wear CPAP  Aspirin Instructions: follow your surgeon's instructions on when to stop ASA   COVID TEST- 09/02/21   Anesthesia review: n/a  Patient denies shortness of breath, fever, cough and chest pain at PAT appointment   All instructions explained to the patient, with a verbal understanding of the material. Patient agrees to go over the instructions while at home for a better understanding. Patient also instructed to self quarantine after being tested for COVID-19. The opportunity to ask questions was provided.

## 2021-09-04 NOTE — Anesthesia Preprocedure Evaluation (Addendum)
Anesthesia Evaluation  Patient identified by MRN, date of birth, ID band Patient awake    Reviewed: Allergy & Precautions, NPO status , Patient's Chart, lab work & pertinent test results  Airway Mallampati: II  TM Distance: >3 FB Neck ROM: Full    Dental no notable dental hx. (+) Upper Dentures, Lower Dentures, Dental Advisory Given   Pulmonary sleep apnea , former smoker,  Right sided lung mass   Pulmonary exam normal breath sounds clear to auscultation (-) decreased breath sounds      Cardiovascular hypertension, Pt. on medications and Pt. on home beta blockers Normal cardiovascular exam Rhythm:Regular Rate:Normal     Neuro/Psych negative neurological ROS  negative psych ROS   GI/Hepatic Neg liver ROS, GERD  ,  Endo/Other  negative endocrine ROS  Renal/GU negative Renal ROS  negative genitourinary   Musculoskeletal  (+) Arthritis ,   Abdominal   Peds  Hematology negative hematology ROS (+)   Anesthesia Other Findings   Reproductive/Obstetrics                            Anesthesia Physical  Anesthesia Plan  ASA: 3  Anesthesia Plan: General   Post-op Pain Management:    Induction: Intravenous  PONV Risk Score and Plan: 3 and Ondansetron, Dexamethasone, Propofol infusion, Treatment may vary due to age or medical condition and Midazolam  Airway Management Planned: Double Lumen EBT  Additional Equipment: Arterial line  Intra-op Plan:   Post-operative Plan: Possible Post-op intubation/ventilation  Informed Consent: I have reviewed the patients History and Physical, chart, labs and discussed the procedure including the risks, benefits and alternatives for the proposed anesthesia with the patient or authorized representative who has indicated his/her understanding and acceptance.     Dental advisory given  Plan Discussed with: CRNA  Anesthesia Plan Comments: (2 x PIV, +/-  cvl)       Anesthesia Quick Evaluation

## 2021-09-05 ENCOUNTER — Encounter (HOSPITAL_COMMUNITY)
Admission: RE | Disposition: A | Discharge status: Home / Self Care | Payer: Self-pay | Source: Home / Self Care | Attending: Thoracic Surgery (Cardiothoracic Vascular Surgery)

## 2021-09-05 ENCOUNTER — Inpatient Hospital Stay (HOSPITAL_COMMUNITY): Payer: BC Managed Care – PPO | Admitting: Anesthesiology

## 2021-09-05 ENCOUNTER — Inpatient Hospital Stay (HOSPITAL_COMMUNITY)
Admission: RE | Admit: 2021-09-05 | Discharge: 2021-09-13 | DRG: 327 | Disposition: A | Discharge status: Home / Self Care | Payer: BC Managed Care – PPO | Attending: Thoracic Surgery (Cardiothoracic Vascular Surgery) | Admitting: Thoracic Surgery (Cardiothoracic Vascular Surgery)

## 2021-09-05 ENCOUNTER — Inpatient Hospital Stay (HOSPITAL_COMMUNITY): Payer: BC Managed Care – PPO

## 2021-09-05 ENCOUNTER — Encounter (HOSPITAL_COMMUNITY): Payer: Self-pay | Admitting: Thoracic Surgery (Cardiothoracic Vascular Surgery)

## 2021-09-05 DIAGNOSIS — Z9049 Acquired absence of other specified parts of digestive tract: Secondary | ICD-10-CM

## 2021-09-05 DIAGNOSIS — I10 Essential (primary) hypertension: Secondary | ICD-10-CM | POA: Diagnosis present

## 2021-09-05 DIAGNOSIS — C155 Malignant neoplasm of lower third of esophagus: Secondary | ICD-10-CM | POA: Diagnosis present

## 2021-09-05 DIAGNOSIS — J939 Pneumothorax, unspecified: Secondary | ICD-10-CM

## 2021-09-05 DIAGNOSIS — Z87891 Personal history of nicotine dependence: Secondary | ICD-10-CM | POA: Diagnosis not present

## 2021-09-05 DIAGNOSIS — E860 Dehydration: Secondary | ICD-10-CM | POA: Diagnosis not present

## 2021-09-05 DIAGNOSIS — I959 Hypotension, unspecified: Secondary | ICD-10-CM | POA: Diagnosis not present

## 2021-09-05 DIAGNOSIS — K219 Gastro-esophageal reflux disease without esophagitis: Secondary | ICD-10-CM | POA: Diagnosis present

## 2021-09-05 DIAGNOSIS — D62 Acute posthemorrhagic anemia: Secondary | ICD-10-CM | POA: Diagnosis not present

## 2021-09-05 DIAGNOSIS — Z20822 Contact with and (suspected) exposure to covid-19: Secondary | ICD-10-CM | POA: Diagnosis present

## 2021-09-05 DIAGNOSIS — Z91030 Bee allergy status: Secondary | ICD-10-CM | POA: Diagnosis not present

## 2021-09-05 DIAGNOSIS — N400 Enlarged prostate without lower urinary tract symptoms: Secondary | ICD-10-CM | POA: Diagnosis present

## 2021-09-05 DIAGNOSIS — Z79899 Other long term (current) drug therapy: Secondary | ICD-10-CM | POA: Diagnosis not present

## 2021-09-05 DIAGNOSIS — R000 Tachycardia, unspecified: Secondary | ICD-10-CM | POA: Diagnosis not present

## 2021-09-05 DIAGNOSIS — C770 Secondary and unspecified malignant neoplasm of lymph nodes of head, face and neck: Secondary | ICD-10-CM | POA: Diagnosis present

## 2021-09-05 DIAGNOSIS — Z85820 Personal history of malignant melanoma of skin: Secondary | ICD-10-CM

## 2021-09-05 DIAGNOSIS — Z09 Encounter for follow-up examination after completed treatment for conditions other than malignant neoplasm: Secondary | ICD-10-CM

## 2021-09-05 DIAGNOSIS — G4730 Sleep apnea, unspecified: Secondary | ICD-10-CM | POA: Diagnosis present

## 2021-09-05 DIAGNOSIS — C159 Malignant neoplasm of esophagus, unspecified: Secondary | ICD-10-CM

## 2021-09-05 DIAGNOSIS — Z7982 Long term (current) use of aspirin: Secondary | ICD-10-CM | POA: Diagnosis not present

## 2021-09-05 DIAGNOSIS — K9189 Other postprocedural complications and disorders of digestive system: Secondary | ICD-10-CM

## 2021-09-05 DIAGNOSIS — Z9889 Other specified postprocedural states: Secondary | ICD-10-CM

## 2021-09-05 DIAGNOSIS — E871 Hypo-osmolality and hyponatremia: Secondary | ICD-10-CM | POA: Diagnosis not present

## 2021-09-05 DIAGNOSIS — R197 Diarrhea, unspecified: Secondary | ICD-10-CM | POA: Diagnosis not present

## 2021-09-05 DIAGNOSIS — E785 Hyperlipidemia, unspecified: Secondary | ICD-10-CM | POA: Diagnosis present

## 2021-09-05 DIAGNOSIS — Z6823 Body mass index (BMI) 23.0-23.9, adult: Secondary | ICD-10-CM

## 2021-09-05 DIAGNOSIS — J90 Pleural effusion, not elsewhere classified: Secondary | ICD-10-CM

## 2021-09-05 DIAGNOSIS — Z9221 Personal history of antineoplastic chemotherapy: Secondary | ICD-10-CM

## 2021-09-05 DIAGNOSIS — E440 Moderate protein-calorie malnutrition: Secondary | ICD-10-CM | POA: Diagnosis present

## 2021-09-05 DIAGNOSIS — Z923 Personal history of irradiation: Secondary | ICD-10-CM

## 2021-09-05 HISTORY — PX: ESOPHAGOGASTRODUODENOSCOPY: SHX5428

## 2021-09-05 HISTORY — PX: JEJUNOSTOMY: SHX313

## 2021-09-05 HISTORY — PX: INTERCOSTAL NERVE BLOCK: SHX5021

## 2021-09-05 LAB — POCT I-STAT 7, (LYTES, BLD GAS, ICA,H+H)
Acid-Base Excess: 1 mmol/L (ref 0.0–2.0)
Acid-Base Excess: 1 mmol/L (ref 0.0–2.0)
Bicarbonate: 26 mmol/L (ref 20.0–28.0)
Bicarbonate: 26.4 mmol/L (ref 20.0–28.0)
Calcium, Ion: 1.16 mmol/L (ref 1.15–1.40)
Calcium, Ion: 1.22 mmol/L (ref 1.15–1.40)
HCT: 25 % — ABNORMAL LOW (ref 39.0–52.0)
HCT: 27 % — ABNORMAL LOW (ref 39.0–52.0)
Hemoglobin: 8.5 g/dL — ABNORMAL LOW (ref 13.0–17.0)
Hemoglobin: 9.2 g/dL — ABNORMAL LOW (ref 13.0–17.0)
O2 Saturation: 100 %
O2 Saturation: 97 %
Patient temperature: 98.7
Potassium: 4.2 mmol/L (ref 3.5–5.1)
Potassium: 3.6 mmol/L (ref 3.5–5.1)
Sodium: 137 mmol/L (ref 135–145)
Sodium: 137 mmol/L (ref 135–145)
TCO2: 27 mmol/L (ref 22–32)
TCO2: 28 mmol/L (ref 22–32)
pCO2 arterial: 39.8 mmHg (ref 32.0–48.0)
pCO2 arterial: 44.4 mmHg (ref 32.0–48.0)
pH, Arterial: 7.424 (ref 7.350–7.450)
pH, Arterial: 7.383 (ref 7.350–7.450)
pO2, Arterial: 296 mmHg — ABNORMAL HIGH (ref 83.0–108.0)
pO2, Arterial: 95 mmHg (ref 83.0–108.0)

## 2021-09-05 LAB — BASIC METABOLIC PANEL
Anion gap: 5 (ref 5–15)
BUN: 5 mg/dL — ABNORMAL LOW (ref 8–23)
CO2: 28 mmol/L (ref 22–32)
Calcium: 8.4 mg/dL — ABNORMAL LOW (ref 8.9–10.3)
Chloride: 104 mmol/L (ref 98–111)
Creatinine, Ser: 0.82 mg/dL (ref 0.61–1.24)
GFR, Estimated: >60 mL/min (ref >60)
Glucose, Bld: 145 mg/dL — ABNORMAL HIGH (ref 70–99)
Potassium: 3.8 mmol/L (ref 3.5–5.1)
Sodium: 137 mmol/L (ref 135–145)

## 2021-09-05 LAB — CBC
HCT: 27 % — ABNORMAL LOW (ref 39.0–52.0)
Hemoglobin: 9.1 g/dL — ABNORMAL LOW (ref 13.0–17.0)
MCH: 34.7 pg — ABNORMAL HIGH (ref 26.0–34.0)
MCHC: 33.7 g/dL (ref 30.0–36.0)
MCV: 103.1 fL — ABNORMAL HIGH (ref 80.0–100.0)
Platelets: 89 10*3/uL — ABNORMAL LOW (ref 150–400)
RBC: 2.62 MIL/uL — ABNORMAL LOW (ref 4.22–5.81)
RDW: 12.9 % (ref 11.5–15.5)
WBC: 7.7 10*3/uL (ref 4.0–10.5)
nRBC: 0 % (ref 0.0–0.2)

## 2021-09-05 LAB — GLUCOSE, CAPILLARY: Glucose-Capillary: 146 mg/dL — ABNORMAL HIGH (ref 70–99)

## 2021-09-05 LAB — PREPARE RBC (CROSSMATCH)

## 2021-09-05 LAB — ABO/RH: ABO/RH(D): A POS

## 2021-09-05 SURGERY — ESOPHAGECTOMY, ROBOT-ASSISTED
Anesthesia: General | Site: Chest

## 2021-09-05 MED ORDER — INDOCYANINE GREEN 25 MG IV SOLR
INTRAVENOUS | Status: DC | PRN
  Administered 2021-09-05: 15 mg via INTRAVENOUS

## 2021-09-05 MED ORDER — HEMOSTATIC AGENTS (NO CHARGE) OPTIME
TOPICAL | Status: DC | PRN
  Administered 2021-09-05: 1 via TOPICAL

## 2021-09-05 MED ORDER — FENTANYL CITRATE (PF) 250 MCG/5ML IJ SOLN
INTRAMUSCULAR | Status: AC
  Filled 2021-09-05: qty 5

## 2021-09-05 MED ORDER — DEXAMETHASONE SODIUM PHOSPHATE 10 MG/ML IJ SOLN
INTRAMUSCULAR | Status: AC
  Filled 2021-09-05: qty 1

## 2021-09-05 MED ORDER — CHLORHEXIDINE GLUCONATE 0.12 % MT SOLN
15.0000 mL | OROMUCOSAL | Status: AC
  Administered 2021-09-05: 15 mL via OROMUCOSAL
  Filled 2021-09-05: qty 15

## 2021-09-05 MED ORDER — BUPIVACAINE HCL (PF) 0.5 % IJ SOLN
INTRAMUSCULAR | Status: AC
  Filled 2021-09-05: qty 30

## 2021-09-05 MED ORDER — LIDOCAINE 2% (20 MG/ML) 5 ML SYRINGE
INTRAMUSCULAR | Status: DC | PRN
  Administered 2021-09-05: 60 mg via INTRAVENOUS

## 2021-09-05 MED ORDER — LIDOCAINE 2% (20 MG/ML) 5 ML SYRINGE
INTRAMUSCULAR | Status: AC
  Filled 2021-09-05: qty 5

## 2021-09-05 MED ORDER — SODIUM CHLORIDE 0.9 % IV SOLN
2.0000 g | INTRAVENOUS | Status: DC
  Filled 2021-09-05: qty 2

## 2021-09-05 MED ORDER — KETAMINE HCL 10 MG/ML IJ SOLN
INTRAMUSCULAR | Status: DC | PRN
  Administered 2021-09-05: 30 mg via INTRAVENOUS
  Administered 2021-09-05 (×4): 10 mg via INTRAVENOUS

## 2021-09-05 MED ORDER — CHLORHEXIDINE GLUCONATE CLOTH 2 % EX PADS
6.0000 | MEDICATED_PAD | Freq: Every day | CUTANEOUS | Status: DC
  Administered 2021-09-05 – 2021-09-13 (×7): 6 via TOPICAL

## 2021-09-05 MED ORDER — DEXTROSE-NACL 5-0.9 % IV SOLN: INTRAVENOUS | Status: DC

## 2021-09-05 MED ORDER — PROPOFOL 10 MG/ML IV BOLUS
INTRAVENOUS | Status: AC
  Filled 2021-09-05: qty 40

## 2021-09-05 MED ORDER — PANTOPRAZOLE SODIUM 40 MG IV SOLR
40.0000 mg | Freq: Two times a day (BID) | INTRAVENOUS | Status: DC
  Administered 2021-09-05 – 2021-09-12 (×14): 40 mg via INTRAVENOUS
  Filled 2021-09-05 (×14): qty 40

## 2021-09-05 MED ORDER — PHENYLEPHRINE 40 MCG/ML (10ML) SYRINGE FOR IV PUSH (FOR BLOOD PRESSURE SUPPORT)
PREFILLED_SYRINGE | INTRAVENOUS | Status: DC | PRN
  Administered 2021-09-05 (×2): 40 ug via INTRAVENOUS

## 2021-09-05 MED ORDER — CHLORHEXIDINE GLUCONATE 0.12 % MT SOLN
15.0000 mL | Freq: Once | OROMUCOSAL | Status: DC
  Filled 2021-09-05: qty 15

## 2021-09-05 MED ORDER — SUGAMMADEX SODIUM 200 MG/2ML IV SOLN
INTRAVENOUS | Status: DC | PRN
  Administered 2021-09-05: 200 mg via INTRAVENOUS

## 2021-09-05 MED ORDER — MEPERIDINE HCL 25 MG/ML IJ SOLN
6.2500 mg | INTRAMUSCULAR | Status: DC | PRN
Start: 2021-09-05 — End: 2021-09-05

## 2021-09-05 MED ORDER — HYDROMORPHONE HCL 1 MG/ML IJ SOLN
INTRAMUSCULAR | Status: AC
  Filled 2021-09-05: qty 1

## 2021-09-05 MED ORDER — 0.9 % SODIUM CHLORIDE (POUR BTL) OPTIME
TOPICAL | Status: DC | PRN
  Administered 2021-09-05: 2000 mL

## 2021-09-05 MED ORDER — ONDANSETRON HCL 4 MG/2ML IJ SOLN: 4.0000 mg | INTRAMUSCULAR | Status: DC | PRN

## 2021-09-05 MED ORDER — LACTATED RINGERS IV SOLN: INTRAVENOUS | Status: DC | PRN

## 2021-09-05 MED ORDER — SODIUM CHLORIDE 0.9 % IV SOLN: 10.0000 mL/h | Freq: Once | INTRAVENOUS | Status: DC

## 2021-09-05 MED ORDER — FENTANYL CITRATE (PF) 250 MCG/5ML IJ SOLN
INTRAMUSCULAR | Status: DC | PRN
  Administered 2021-09-05 (×2): 50 ug via INTRAVENOUS
  Administered 2021-09-05: 100 ug via INTRAVENOUS
  Administered 2021-09-05: 50 ug via INTRAVENOUS

## 2021-09-05 MED ORDER — ONDANSETRON HCL 4 MG/2ML IJ SOLN
INTRAMUSCULAR | Status: DC | PRN
  Administered 2021-09-05: 4 mg via INTRAVENOUS

## 2021-09-05 MED ORDER — HYDROMORPHONE HCL 1 MG/ML IJ SOLN
0.2500 mg | INTRAMUSCULAR | Status: DC | PRN
  Administered 2021-09-05: 0.5 mg via INTRAVENOUS

## 2021-09-05 MED ORDER — ORAL CARE MOUTH RINSE: 15.0000 mL | Freq: Once | OROMUCOSAL | Status: DC

## 2021-09-05 MED ORDER — LACTATED RINGERS IV SOLN: INTRAVENOUS | Status: DC

## 2021-09-05 MED ORDER — MORPHINE SULFATE (PF) 2 MG/ML IV SOLN
1.0000 mg | INTRAVENOUS | Status: DC | PRN
  Administered 2021-09-06 (×3): 1 mg via INTRAVENOUS
  Administered 2021-09-06 – 2021-09-07 (×7): 2 mg via INTRAVENOUS
  Administered 2021-09-07: 1 mg via INTRAVENOUS
  Administered 2021-09-07 – 2021-09-10 (×12): 2 mg via INTRAVENOUS
  Filled 2021-09-05 (×2): qty 1
  Filled 2021-09-05: qty 2
  Filled 2021-09-05 (×16): qty 1
  Filled 2021-09-05: qty 2
  Filled 2021-09-05 (×3): qty 1

## 2021-09-05 MED ORDER — KETOROLAC TROMETHAMINE 30 MG/ML IJ SOLN
30.0000 mg | Freq: Three times a day (TID) | INTRAMUSCULAR | Status: AC | PRN
  Administered 2021-09-05 – 2021-09-10 (×13): 30 mg via INTRAVENOUS
  Filled 2021-09-05 (×14): qty 1

## 2021-09-05 MED ORDER — MIDAZOLAM HCL 2 MG/2ML IJ SOLN
INTRAMUSCULAR | Status: AC
  Filled 2021-09-05: qty 2

## 2021-09-05 MED ORDER — BUPIVACAINE LIPOSOME 1.3 % IJ SUSP
INTRAMUSCULAR | Status: AC
  Filled 2021-09-05: qty 20

## 2021-09-05 MED ORDER — ALBUMIN HUMAN 5 % IV SOLN: INTRAVENOUS | Status: DC | PRN

## 2021-09-05 MED ORDER — ACETAMINOPHEN 10 MG/ML IV SOLN
1000.0000 mg | Freq: Four times a day (QID) | INTRAVENOUS | Status: AC
  Administered 2021-09-05 – 2021-09-06 (×4): 1000 mg via INTRAVENOUS
  Filled 2021-09-05 (×4): qty 100

## 2021-09-05 MED ORDER — ROCURONIUM BROMIDE 10 MG/ML (PF) SYRINGE
PREFILLED_SYRINGE | INTRAVENOUS | Status: AC
  Filled 2021-09-05: qty 10

## 2021-09-05 MED ORDER — INSULIN ASPART 100 UNIT/ML IJ SOLN
0.0000 [IU] | INTRAMUSCULAR | Status: DC
  Administered 2021-09-05 – 2021-09-13 (×18): 2 [IU] via SUBCUTANEOUS

## 2021-09-05 MED ORDER — ONDANSETRON HCL 4 MG/2ML IJ SOLN
INTRAMUSCULAR | Status: AC
  Filled 2021-09-05: qty 2

## 2021-09-05 MED ORDER — PROPOFOL 10 MG/ML IV BOLUS
INTRAVENOUS | Status: DC | PRN
  Administered 2021-09-05: 40 mg via INTRAVENOUS
  Administered 2021-09-05: 140 mg via INTRAVENOUS
  Administered 2021-09-05: 20 mg via INTRAVENOUS

## 2021-09-05 MED ORDER — DEXAMETHASONE SODIUM PHOSPHATE 10 MG/ML IJ SOLN
INTRAMUSCULAR | Status: DC | PRN
  Administered 2021-09-05: 10 mg via INTRAVENOUS

## 2021-09-05 MED ORDER — DEXMEDETOMIDINE (PRECEDEX) IN NS 20 MCG/5ML (4 MCG/ML) IV SYRINGE
PREFILLED_SYRINGE | INTRAVENOUS | Status: DC | PRN
  Administered 2021-09-05: 8 ug via INTRAVENOUS
  Administered 2021-09-05: 4 ug via INTRAVENOUS
  Administered 2021-09-05: 8 ug via INTRAVENOUS

## 2021-09-05 MED ORDER — MIDAZOLAM HCL 2 MG/2ML IJ SOLN
INTRAMUSCULAR | Status: DC | PRN
  Administered 2021-09-05: 2 mg via INTRAVENOUS

## 2021-09-05 MED ORDER — DEXMEDETOMIDINE (PRECEDEX) IN NS 20 MCG/5ML (4 MCG/ML) IV SYRINGE
PREFILLED_SYRINGE | INTRAVENOUS | Status: AC
  Filled 2021-09-05: qty 5

## 2021-09-05 MED ORDER — SODIUM CHLORIDE FLUSH 0.9 % IV SOLN
INTRAVENOUS | Status: DC | PRN
  Administered 2021-09-05: 100 mL

## 2021-09-05 MED ORDER — SODIUM CHLORIDE 0.9 % IV SOLN
2.0000 g | Freq: Four times a day (QID) | INTRAVENOUS | Status: AC
  Administered 2021-09-05 – 2021-09-06 (×3): 2 g via INTRAVENOUS
  Filled 2021-09-05 (×3): qty 2

## 2021-09-05 MED ORDER — ROCURONIUM BROMIDE 10 MG/ML (PF) SYRINGE
PREFILLED_SYRINGE | INTRAVENOUS | Status: DC | PRN
  Administered 2021-09-05: 60 mg via INTRAVENOUS
  Administered 2021-09-05: 40 mg via INTRAVENOUS
  Administered 2021-09-05 (×4): 30 mg via INTRAVENOUS
  Administered 2021-09-05: 40 mg via INTRAVENOUS

## 2021-09-05 MED ORDER — KETAMINE HCL 50 MG/5ML IJ SOSY
PREFILLED_SYRINGE | INTRAMUSCULAR | Status: AC
  Filled 2021-09-05: qty 10

## 2021-09-05 MED ORDER — ALBUTEROL SULFATE (2.5 MG/3ML) 0.083% IN NEBU: 2.5000 mg | INHALATION_SOLUTION | Freq: Four times a day (QID) | RESPIRATORY_TRACT | Status: AC | PRN

## 2021-09-05 MED ORDER — PROMETHAZINE HCL 25 MG/ML IJ SOLN: 6.2500 mg | INTRAMUSCULAR | Status: DC | PRN

## 2021-09-05 MED ORDER — INDOCYANINE GREEN 25 MG IV SOLR
INTRAVENOUS | Status: AC
  Filled 2021-09-05: qty 10

## 2021-09-05 SURGICAL SUPPLY — 132 items
BLADE CLIPPER SURG (BLADE) ×4 IMPLANT
BLADE SURG 11 STRL SS (BLADE) ×4 IMPLANT
BUTTON OLYMPUS DEFENDO 5 PIECE (MISCELLANEOUS) ×4 IMPLANT
CANISTER SUCT 3000ML PPV (MISCELLANEOUS) ×8 IMPLANT
CANNULA REDUC XI 12-8 STAPL (CANNULA) ×1
CANNULA REDUCER 12-8 DVNC XI (CANNULA) ×3 IMPLANT
CATH THORACIC 28FR (CATHETERS) IMPLANT
CHLORAPREP W/TINT 26 (MISCELLANEOUS) ×8 IMPLANT
CNTNR URN SCR LID CUP LEK RST (MISCELLANEOUS) ×9 IMPLANT
CONN ST 1/4X3/8  BEN (MISCELLANEOUS) ×1
CONN ST 1/4X3/8 BEN (MISCELLANEOUS) ×3 IMPLANT
CONT SPEC 4OZ STRL OR WHT (MISCELLANEOUS) ×3
COVER BACK TABLE 60X90IN (DRAPES) ×4 IMPLANT
COVER TIP SHEARS 8 DVNC (MISCELLANEOUS) ×3 IMPLANT
COVER TIP SHEARS 8MM DA VINCI (MISCELLANEOUS) ×1
DECANTER SPIKE VIAL GLASS SM (MISCELLANEOUS) ×4 IMPLANT
DEFOGGER SCOPE WARMER CLEARIFY (MISCELLANEOUS) ×4 IMPLANT
DERMABOND ADVANCED (GAUZE/BANDAGES/DRESSINGS) ×2
DERMABOND ADVANCED .7 DNX12 (GAUZE/BANDAGES/DRESSINGS) ×6 IMPLANT
DEVICE SUTURE ENDOST 10MM (ENDOMECHANICALS) ×4 IMPLANT
DRAIN CHANNEL 19F RND (DRAIN) ×4 IMPLANT
DRAIN CHANNEL 28F RND 3/8 FF (WOUND CARE) ×4 IMPLANT
DRAIN CONNECTOR BLAKE 1:1 (MISCELLANEOUS) ×4 IMPLANT
DRAIN PENROSE 1/2X12 LTX STRL (WOUND CARE) ×4 IMPLANT
DRAPE ARM DVNC X/XI (DISPOSABLE) ×12 IMPLANT
DRAPE COLUMN DVNC XI (DISPOSABLE) ×3 IMPLANT
DRAPE CV SPLIT W-CLR ANES SCRN (DRAPES) ×8 IMPLANT
DRAPE DA VINCI XI ARM (DISPOSABLE) ×4
DRAPE DA VINCI XI COLUMN (DISPOSABLE) ×1
DRAPE HALF SHEET 40X57 (DRAPES) ×8 IMPLANT
DRAPE INCISE IOBAN 66X45 STRL (DRAPES) ×8 IMPLANT
DRAPE ORTHO SPLIT 77X108 STRL (DRAPES) ×2
DRAPE SURG ORHT 6 SPLT 77X108 (DRAPES) ×6 IMPLANT
ELECT REM PT RETURN 9FT ADLT (ELECTROSURGICAL) ×4
ELECTRODE REM PT RTRN 9FT ADLT (ELECTROSURGICAL) ×3 IMPLANT
FELT TEFLON 1X6 (MISCELLANEOUS) IMPLANT
FORCEPS GRASP COMBO 8X230 (FORCEP) ×4 IMPLANT
GAUZE KITTNER 4X5 RF (MISCELLANEOUS) ×4 IMPLANT
GAUZE SPONGE 4X4 12PLY STRL (GAUZE/BANDAGES/DRESSINGS) ×4 IMPLANT
GAUZE SPONGE 4X4 12PLY STRL LF (GAUZE/BANDAGES/DRESSINGS) ×8 IMPLANT
GLOVE SURG ENC MOIS LTX SZ7 (GLOVE) ×4 IMPLANT
GLOVE SURG ENC MOIS LTX SZ7.5 (GLOVE) ×4 IMPLANT
GOWN STRL REUS W/ TWL LRG LVL3 (GOWN DISPOSABLE) ×6 IMPLANT
GOWN STRL REUS W/ TWL XL LVL3 (GOWN DISPOSABLE) ×9 IMPLANT
GOWN STRL REUS W/TWL LRG LVL3 (GOWN DISPOSABLE) ×2
GOWN STRL REUS W/TWL XL LVL3 (GOWN DISPOSABLE) ×3
GRASPER ENDOPATH ANVIL 10MM (MISCELLANEOUS) ×4 IMPLANT
GRASPER SUT TROCAR 14GX15 (MISCELLANEOUS) ×4 IMPLANT
HEMOSTAT SURGICEL 2X14 (HEMOSTASIS) ×4 IMPLANT
IRRIGATOR SUCT 8 DISP DVNC XI (IRRIGATION / IRRIGATOR) ×3 IMPLANT
IRRIGATOR SUCTION 8MM XI DISP (IRRIGATION / IRRIGATOR) ×1
IV NS 1000ML (IV SOLUTION)
IV NS 1000ML BAXH (IV SOLUTION) IMPLANT
KIT BASIN OR (CUSTOM PROCEDURE TRAY) ×4 IMPLANT
KIT DILATOR VASC 18G NDL (KITS) IMPLANT
KIT TUBE JEJUNAL 16FR (CATHETERS) ×4 IMPLANT
KIT TURNOVER KIT B (KITS) ×4 IMPLANT
MARKER SKIN DUAL TIP RULER LAB (MISCELLANEOUS) ×4 IMPLANT
NEEDLE 18GX1X1/2 (RX/OR ONLY) (NEEDLE) IMPLANT
NEEDLE SCLEROTHERAPY 23X2X240 (NEEDLE) IMPLANT
NS IRRIG 1000ML POUR BTL (IV SOLUTION) ×8 IMPLANT
OBTURATOR OPTICAL STANDARD 8MM (TROCAR) ×1
OBTURATOR OPTICAL STND 8 DVNC (TROCAR) ×3
OBTURATOR OPTICALSTD 8 DVNC (TROCAR) ×3 IMPLANT
OIL SILICONE PENTAX (PARTS (SERVICE/REPAIRS)) IMPLANT
PACK CHEST (CUSTOM PROCEDURE TRAY) ×4 IMPLANT
PACK UNIVERSAL I (CUSTOM PROCEDURE TRAY) ×4 IMPLANT
PAD ARMBOARD 7.5X6 YLW CONV (MISCELLANEOUS) ×8 IMPLANT
PORT ACCESS TROCAR AIRSEAL 12 (TROCAR) ×3 IMPLANT
PORT ACCESS TROCAR AIRSEAL 5M (TROCAR) ×1
RELOAD STAPLER 2.5X45 WHT DVNC (STAPLE) ×6 IMPLANT
RELOAD STAPLER 3.5X45 BLU DVNC (STAPLE) ×3 IMPLANT
RELOAD STAPLER 4.3X45 GRN DVNC (STAPLE) ×27 IMPLANT
RETRACTOR WOUND ALXS 19CM XSML (INSTRUMENTS) ×3 IMPLANT
RTRCTR WOUND ALEXIS 19CM XSML (INSTRUMENTS) ×4
SCISSORS LAP 5X35 DISP (ENDOMECHANICALS) ×4 IMPLANT
SEAL CANN UNIV 5-8 DVNC XI (MISCELLANEOUS) ×9 IMPLANT
SEAL XI 5MM-8MM UNIVERSAL (MISCELLANEOUS) ×3
SEALER SYNCHRO 8 IS4000 DV (MISCELLANEOUS) ×1
SEALER SYNCHRO 8 IS4000 DVNC (MISCELLANEOUS) ×3 IMPLANT
SET GASTRO INIT PLACEMENT MED (SET/KITS/TRAYS/PACK) ×4 IMPLANT
SET IRRIG TUBING LAPAROSCOPIC (IRRIGATION / IRRIGATOR) IMPLANT
SET TRI-LUMEN FLTR TB AIRSEAL (TUBING) ×4 IMPLANT
SHEET MEDIUM DRAPE 40X70 STRL (DRAPES) ×4 IMPLANT
STAPLER 45 DA VINCI SURE FORM (STAPLE) ×1
STAPLER 45 SUREFORM DVNC (STAPLE) ×3 IMPLANT
STAPLER CANNULA SEAL DVNC XI (STAPLE) ×3 IMPLANT
STAPLER CANNULA SEAL XI (STAPLE) ×1
STAPLER CIRC 25MM 4.8MM THK (STAPLE) IMPLANT
STAPLER RELOAD 2.5X45 WHITE (STAPLE) ×2
STAPLER RELOAD 2.5X45 WHT DVNC (STAPLE) ×6
STAPLER RELOAD 3.5X45 BLU DVNC (STAPLE) ×3
STAPLER RELOAD 3.5X45 BLUE (STAPLE) ×1
STAPLER RELOAD 4.3X45 GREEN (STAPLE) ×9
STAPLER RELOAD 4.3X45 GRN DVNC (STAPLE) ×27
STAPLER TRANS-ORAL 25MM EEA (STAPLE) IMPLANT
STOPCOCK 4 WAY LG BORE MALE ST (IV SETS) ×4 IMPLANT
SUT ETHIBOND 0 36 GRN (SUTURE) ×8 IMPLANT
SUT SILK  1 MH (SUTURE) ×3
SUT SILK 1 MH (SUTURE) ×9 IMPLANT
SUT SILK 2 0 SH (SUTURE) ×4 IMPLANT
SUT SURGIDAC NAB ES-9 0 48 120 (SUTURE) ×20 IMPLANT
SUT VIC AB 2-0 CT1 36 (SUTURE) IMPLANT
SUT VIC AB 2-0 SH 27 (SUTURE) ×2
SUT VIC AB 2-0 SH 27XBRD (SUTURE) ×6 IMPLANT
SUT VIC AB 3-0 SH 27 (SUTURE) ×7
SUT VIC AB 3-0 SH 27X BRD (SUTURE) ×21 IMPLANT
SUT VIC AB 3-0 SH 8-18 (SUTURE) ×4 IMPLANT
SUT VICRYL 0 UR6 27IN ABS (SUTURE) ×8 IMPLANT
SUT VLOC 180 0 6IN GS21 (SUTURE) ×12 IMPLANT
SUT VLOC 180 0 9IN  GS21 (SUTURE) ×1
SUT VLOC 180 0 9IN GS21 (SUTURE) ×3 IMPLANT
SYR 10ML LL (SYRINGE) ×4 IMPLANT
SYR 20ML ECCENTRIC (SYRINGE) ×4 IMPLANT
SYR 20ML LL LF (SYRINGE) ×4 IMPLANT
SYR 30ML SLIP (SYRINGE) ×4 IMPLANT
SYR 50ML LL SCALE MARK (SYRINGE) ×4 IMPLANT
SYR TOOMEY 50ML (SYRINGE) ×4 IMPLANT
SYSTEM SAHARA CHEST DRAIN ATS (WOUND CARE) ×4 IMPLANT
TAPE CLOTH SURG 4X10 WHT LF (GAUZE/BANDAGES/DRESSINGS) ×8 IMPLANT
TOWEL GREEN STERILE (TOWEL DISPOSABLE) ×4 IMPLANT
TOWEL GREEN STERILE FF (TOWEL DISPOSABLE) ×4 IMPLANT
TRAY FOLEY MTR SLVR 16FR STAT (SET/KITS/TRAYS/PACK) ×4 IMPLANT
TROCAR XCEL BLADELESS 5X75MML (TROCAR) ×4 IMPLANT
TROCAR XCEL NON-BLD 5MMX100MML (ENDOMECHANICALS) IMPLANT
TUBE CONNECTING 20X1/4 (TUBING) ×4 IMPLANT
TUBE J 18FR (TUBING) IMPLANT
TUBING ENDO SMARTCAP (MISCELLANEOUS) ×4 IMPLANT
TUBING EXTENTION W/L.L. (IV SETS) ×4 IMPLANT
TUBING LAP HI FLOW INSUFFLATIO (TUBING) ×4 IMPLANT
UNDERPAD 30X36 HEAVY ABSORB (UNDERPADS AND DIAPERS) ×4 IMPLANT
WATER STERILE IRR 1000ML POUR (IV SOLUTION) ×8 IMPLANT

## 2021-09-05 NOTE — Clinical Note (Incomplete)
Pt arrived to [facility]

## 2021-09-05 NOTE — Anesthesia Postprocedure Evaluation (Signed)
Anesthesia Post Note  Patient: Nathaniel Jennings  Procedure(s) Performed: XI ROBOTIC ASSISTED IVOR LEWIS ESOPHAGECTOMY (Chest) ESOPHAGOGASTRODUODENOSCOPY (EGD) INTERCOSTAL NERVE BLOCK JEJUNOSTOMY     Patient location during evaluation: PACU Anesthesia Type: General Level of consciousness: sedated and patient cooperative Pain management: pain level controlled Vital Signs Assessment: post-procedure vital signs reviewed and stable Respiratory status: spontaneous breathing Cardiovascular status: stable Anesthetic complications: no   No notable events documented.  Last Vitals:  Vitals:   09/05/21 1614 09/05/21 1620  BP:  105/74  Pulse: 84 81  Resp: 16 13  Temp:  (!) 36.4 C  SpO2: 93% 95%    Last Pain:  Vitals:   09/05/21 1620  TempSrc:   PainSc: [place]

## 2021-09-05 NOTE — Transfer of Care (Signed)
Immediate Anesthesia Transfer of Care Note  Patient: Nathaniel Jennings  Procedure(s) Performed: XI ROBOTIC ASSISTED IVOR LEWIS ESOPHAGECTOMY (Chest) ESOPHAGOGASTRODUODENOSCOPY (EGD) INTERCOSTAL NERVE BLOCK JEJUNOSTOMY  Patient Location: PACU  Anesthesia Type:General  Level of Consciousness: awake, alert , oriented and patient cooperative  Airway & Oxygen Therapy: Patient Spontanous Breathing and Patient connected to nasal cannula oxygen  Post-op Assessment: Report given to RN and Post -op Vital signs reviewed and stable  Post vital signs: Reviewed and stable  Last Vitals:  Vitals Value Taken Time  BP 139/89 09/05/21 1519  Temp    Pulse 94 09/05/21 1529  Resp 19 09/05/21 1529  SpO2 90 % 09/05/21 1529  Vitals shown include unvalidated device data.  Last Pain:  Vitals:   09/05/21 0623  TempSrc:   PainSc: 0-No pain         Complications: No notable events documented.

## 2021-09-05 NOTE — Anesthesia Procedure Notes (Signed)
Central Venous Catheter Insertion Performed by: Nolon Nations, MD, anesthesiologist Start/End11/14/2022 7:05 AM, 09/05/2021 7:20 AM Patient location: Pre-op. Preanesthetic checklist: patient identified, IV checked, site marked, risks and benefits discussed, surgical consent, monitors and equipment checked, pre-op evaluation, timeout performed and anesthesia consent Position: Trendelenburg Lidocaine 1% used for infiltration and patient sedated Hand hygiene performed , maximum sterile barriers used  and Seldinger technique used Catheter size: 8 Fr Total catheter length 16. Central line was placed.Double lumen Procedure performed using ultrasound guided technique. Ultrasound Notes:anatomy identified, needle tip was noted to be adjacent to the nerve/plexus identified, no ultrasound evidence of intravascular and/or intraneural injection and image(s) printed for medical record Attempts: 1 Following insertion, dressing applied, line sutured and Biopatch. Post procedure assessment: blood return through all ports, free fluid flow and no air  Patient tolerated the procedure well with no immediate complications.

## 2021-09-05 NOTE — Interval H&P Note (Signed)
History and Physical Interval Note:  09/05/2021 6:58 AM  Nathaniel Jennings  has presented today for surgery, with the diagnosis of adenocarcinoma of the distal esophagus.  The various methods of treatment have been discussed with the patient and family. After consideration of risks, benefits and other options for treatment, the patient has consented to  Procedure(s): XI ROBOTIC ASSISTED IVOR LEWIS ESOPHAGECTOMY (N/A) ESOPHAGOGASTRODUODENOSCOPY (EGD) (N/A) as a surgical intervention.  The patient's history has been reviewed, patient examined, no change in status, stable for surgery.  I have reviewed the patient's chart and labs.  Questions were answered to the patient's satisfaction.     [name]

## 2021-09-05 NOTE — Anesthesia Procedure Notes (Signed)
Arterial Line Insertion Start/End11/14/2022 6:55 AM, 09/05/2021 7:05 AM Performed by: Erick Colace, CRNA, CRNA  Patient location: Pre-op. Preanesthetic checklist: patient identified, IV checked, site marked, risks and benefits discussed, surgical consent, monitors and equipment checked, pre-op evaluation, timeout performed and anesthesia consent Lidocaine 1% used for infiltration Left, radial was placed Catheter size: 20 G Hand hygiene performed  and maximum sterile barriers used   Attempts: 1 Procedure performed without using ultrasound guided technique. Following insertion, dressing applied and Biopatch. Post procedure assessment: normal and unchanged

## 2021-09-05 NOTE — Anesthesia Procedure Notes (Signed)
Procedure Name: Intubation Date/Time: 09/05/2021 7:43 AM Performed by: Erick Colace, CRNA Pre-anesthesia Checklist: Patient identified, Emergency Drugs available, Suction available and Patient being monitored Patient Re-evaluated:Patient Re-evaluated prior to induction Oxygen Delivery Method: Circle system utilized Preoxygenation: Pre-oxygenation with 100% oxygen Induction Type: IV induction Ventilation: Mask ventilation without difficulty Laryngoscope Size: Mac and 4 Grade View: Grade I Tube type: Oral Endobronchial tube: Left, EBT position confirmed by fiberoptic bronchoscope and Double lumen EBT and 37 Fr Number of attempts: 1 Airway Equipment and Method: Stylet and Oral airway Placement Confirmation: ETT inserted through vocal cords under direct vision, positive ETCO2 and breath sounds checked- equal and bilateral Secured at: 31 cm Tube secured with: Tape Dental Injury: Teeth and Oropharynx as per pre-operative assessment

## 2021-09-05 NOTE — Hospital Course (Addendum)
History of Present Illness:    Nathaniel Jennings 65 y.o. male with history of poorly differentiated adenocarcinoma the distal esophagus comes in in follow-up after completion of his neoadjuvant therapy.  Overall he is doing well his weight has been stable.  He is very anxious to proceed with surgery.  The patient and all relevant studies were evaluated by Dr. Kipp Brood who recommended proceeding with robotic esophagectomy.  He was admitted this hospitalization electively for the procedure.  Hospital course:  The patient was admitted electively and on 09/05/2021 he was taken to the operating room at which time he underwent robotic Ivor Lewis esophagectomy.  A jejunostomy tube was placed at the time of the procedure as well.  He tolerated it well was taken to the postanesthesia care unit in stable condition.  Postoperative hospital course:  On postoperative day #1 patient was seen by a nutrition and tube feedings via the jejunostomy tube have been initiated.  He is noted to have a postoperative expected acute loss anemia and is being monitored clinically.  Renal function has remained within normal limits.  He is hemodynamically stable and pain is under good control.  He was felt to be stable for transfer to the [place] progression unit.  On postop day 2 he was having difficulties with sore throat felt most likely related to his NG tube. He has has some hypotension which has improved with fluid bolus and albumin. He is tolerating tube feeds but has had some mild nausea. [name] He is somewhat slow in his mobilization d/t surgical pain but is showing steady improvement overall. NG tube was nighttime tube feedings 11/17.  The patient underwent esophogram on 11/18 which showed no evidence of anastomotic leak.  He was evaluated by SLP to teach proper swallowing mechanics.  He was started on a dysphagia diet.  Unfortunately he was unable to eat due to tube feedings be run too high a rate and for too long overnight.  This caused  the patient abdominal discomfort and distention.  His feedings were adjusted to run at at reduced rate of 65 ml/hr from 7pm to 5am to facilitate a break and encourage oral intake.  The patient's abdominal discomfort and distention improved with reduction in tube feedings.  He was able to eat food throughout the day w/o difficulty.  He was progressed to a Dysphagia 3 diet.  We plan to continue nighttime tube feedings for the immediate future.  He was tachycardic and restarted on his home regimen of Toprol XL.

## 2021-09-05 NOTE — Brief Op Note (Signed)
09/05/2021  12:25 PM  PATIENT:  Nathaniel Jennings  65 y.o. male  PRE-OPERATIVE DIAGNOSIS:  adenocarcinoma of the distal esophagus  POST-OPERATIVE DIAGNOSIS:  adenocarcinoma of the distal esophagus  PROCEDURE:  Procedure(s): XI ROBOTIC ASSISTED IVOR LEWIS ESOPHAGECTOMY (N/A) ESOPHAGOGASTRODUODENOSCOPY (EGD) (N/A) INTERCOSTAL NERVE BLOCK JEJUNOSTOMY J-TUBE PLACEMENT  SURGEON:  Surgeon(s) and Role:    * Lightfoot, Lucile Crater, MD - Primary  PHYSICIAN ASSISTANT: Kaleeyah Cuffie PA-C  ANESTHESIA:   general  EBL:  200 mL   BLOOD ADMINISTERED:none  DRAINS: Nasogastric Tube, 83f Chest Tube(s) in the LEFT HEMITHORAX, and Jejunostomy Tube   LOCAL MEDICATIONS USED:  OTHER EXPAREL  SPECIMEN:  Source of Specimen:  ESOPHAGOGASTRECTOMY, LEVEL 8 LN  DISPOSITION OF SPECIMEN:  PATHOLOGY  COUNTS:  YES  TOURNIQUET:  * No tourniquets in log *  DICTATION: .Dragon Dictation  PLAN OF CARE: Admit to inpatient   PATIENT DISPOSITION:  PACU - hemodynamically stable.   Delay start of Pharmacological VTE agent (>24hrs) due to surgical blood loss or risk of bleeding: yes  COMPLICATIONS: NO KNOWN

## 2021-09-06 ENCOUNTER — Encounter (HOSPITAL_COMMUNITY): Payer: Self-pay | Admitting: Thoracic Surgery (Cardiothoracic Vascular Surgery)

## 2021-09-06 LAB — CBC
HCT: 26.9 % — ABNORMAL LOW (ref 39.0–52.0)
Hemoglobin: 9 g/dL — ABNORMAL LOW (ref 13.0–17.0)
MCH: 34.9 pg — ABNORMAL HIGH (ref 26.0–34.0)
MCHC: 33.5 g/dL (ref 30.0–36.0)
MCV: 104.3 fL — ABNORMAL HIGH (ref 80.0–100.0)
Platelets: 96 10*3/uL — ABNORMAL LOW (ref 150–400)
RBC: 2.58 MIL/uL — ABNORMAL LOW (ref 4.22–5.81)
RDW: 12.8 % (ref 11.5–15.5)
WBC: 8 10*3/uL (ref 4.0–10.5)
nRBC: 0 % (ref 0.0–0.2)

## 2021-09-06 LAB — GLUCOSE, CAPILLARY
Glucose-Capillary: 97 mg/dL (ref 70–99)
Glucose-Capillary: 104 mg/dL — ABNORMAL HIGH (ref 70–99)
Glucose-Capillary: 125 mg/dL — ABNORMAL HIGH (ref 70–99)
Glucose-Capillary: 139 mg/dL — ABNORMAL HIGH (ref 70–99)
Glucose-Capillary: 105 mg/dL — ABNORMAL HIGH (ref 70–99)
Glucose-Capillary: 90 mg/dL (ref 70–99)
Glucose-Capillary: 95 mg/dL (ref 70–99)

## 2021-09-06 LAB — BASIC METABOLIC PANEL
Anion gap: 6 (ref 5–15)
BUN: 6 mg/dL — ABNORMAL LOW (ref 8–23)
CO2: 27 mmol/L (ref 22–32)
Calcium: 8.5 mg/dL — ABNORMAL LOW (ref 8.9–10.3)
Chloride: 104 mmol/L (ref 98–111)
Creatinine, Ser: 0.74 mg/dL (ref 0.61–1.24)
GFR, Estimated: >60 mL/min (ref >60)
Glucose, Bld: 111 mg/dL — ABNORMAL HIGH (ref 70–99)
Potassium: 3.7 mmol/L (ref 3.5–5.1)
Sodium: 137 mmol/L (ref 135–145)

## 2021-09-06 LAB — MAGNESIUM
Magnesium: 1.3 mg/dL — ABNORMAL LOW (ref 1.7–2.4)
Magnesium: 1.7 mg/dL (ref 1.7–2.4)

## 2021-09-06 LAB — PHOSPHORUS
Phosphorus: 2.1 mg/dL — ABNORMAL LOW (ref 2.5–4.6)
Phosphorus: 2.1 mg/dL — ABNORMAL LOW (ref 2.5–4.6)

## 2021-09-06 MED ORDER — PROSOURCE TF PO LIQD
45.0000 mL | Freq: Every day | ORAL | Status: DC
  Administered 2021-09-07 – 2021-09-09 (×3): 45 mL
  Filled 2021-09-06 (×4): qty 45

## 2021-09-06 MED ORDER — KETOROLAC TROMETHAMINE 30 MG/ML IJ SOLN
30.0000 mg | Freq: Once | INTRAMUSCULAR | Status: AC
  Administered 2021-09-06: 30 mg via INTRAVENOUS

## 2021-09-06 MED ORDER — SODIUM CHLORIDE 0.9 % IV SOLN: INTRAVENOUS | Status: DC

## 2021-09-06 MED ORDER — OSMOLITE 1.5 CAL PO LIQD
1000.0000 mL | ORAL | Status: DC
  Administered 2021-09-06 – 2021-09-08 (×4): 1000 mL
  Filled 2021-09-06 (×2): qty 1000

## 2021-09-06 MED ORDER — ALBUMIN HUMAN 5 % IV SOLN
12.5000 g | Freq: Once | INTRAVENOUS | Status: DC
  Filled 2021-09-06: qty 250

## 2021-09-06 MED ORDER — POTASSIUM CHLORIDE 10 MEQ/50ML IV SOLN
10.0000 meq | INTRAVENOUS | Status: AC
  Administered 2021-09-06 (×3): 10 meq via INTRAVENOUS
  Filled 2021-09-06 (×3): qty 50

## 2021-09-06 MED ORDER — SODIUM CHLORIDE 0.9 % IV SOLN: Freq: Once | INTRAVENOUS | Status: AC

## 2021-09-06 MED ORDER — ALBUMIN HUMAN 5 % IV SOLN
12.5000 g | Freq: Once | INTRAVENOUS | Status: AC
  Administered 2021-09-06: 12.5 g via INTRAVENOUS
  Filled 2021-09-06: qty 250

## 2021-09-06 MED ORDER — MAGNESIUM SULFATE 50 % IJ SOLN
3.0000 g | Freq: Once | INTRAVENOUS | Status: AC
  Administered 2021-09-06: 3 g via INTRAVENOUS
  Filled 2021-09-06: qty 6

## 2021-09-06 NOTE — Progress Notes (Signed)
      [address]       [place], [address]             [phone]                 1 Day Post-Op Procedure(s) (LRB): XI ROBOTIC ASSISTED IVOR LEWIS ESOPHAGECTOMY (N/A) ESOPHAGOGASTRODUODENOSCOPY (EGD) (N/A) INTERCOSTAL NERVE BLOCK JEJUNOSTOMY   Events: No events _______________________________________________________________ Vitals: BP (!) 88/61   Pulse (!) 58   Temp 98.3 F (36.8 C) (Oral)   Resp 13   Ht 5\' 6"  (1.676 m)   Wt 66.3 kg   SpO2 100%   BMI 23.59 kg/m  Filed Weights   09/05/21 0551 09/06/21 0634  Weight: 65.8 kg 66.3 kg     - Neuro: alert NAD  - Cardiovascular: sinus  Drips: none.      - Pulm: EWOB. SS CT output    ABG    Component Value Date/Time   PHART 7.383 09/05/2021 1746   PCO2ART 44.4 09/05/2021 1746   PO2ART 95 09/05/2021 1746   HCO3 26.4 09/05/2021 1746   TCO2 28 09/05/2021 1746   ACIDBASEDEF 0.1 09/02/2021 0923   O2SAT 97.0 09/05/2021 1746    - Abd: ND - Extremity: warm  .Intake/Output      11/14 0701 11/15 0700 11/15 0701 11/16 0700   I.V. (mL/kg) 4553 (68.7)    IV Piggyback 1535.8    Total Intake(mL/kg) 6088.8 (91.8)    Urine (mL/kg/hr) 4380 (2.8)    Drains 145    Blood 200    Chest Tube 309    Total Output 5034    Net +1054.8            _______________________________________________________________ Labs: CBC Latest Ref Rng & Units 09/06/2021 09/05/2021 09/05/2021  WBC 4.0 - 10.5 K/uL 8.0 7.7 -  Hemoglobin 13.0 - 17.0 g/dL 9.0(L) 9.1(L) 9.2(L)  Hematocrit 39.0 - 52.0 % 26.9(L) 27.0(L) 27.0(L)  Platelets 150 - 400 K/uL 96(L) 89(L) -   CMP Latest Ref Rng & Units 09/06/2021 09/05/2021 09/05/2021  Glucose 70 - 99 mg/dL 111(H) 145(H) -  BUN 8 - 23 mg/dL 6(L) 5(L) -  Creatinine 0.61 - 1.24 mg/dL 0.74 0.82 -  Sodium 135 - 145 mmol/L 137 137 137  Potassium 3.5 - 5.1 mmol/L 3.7 3.8 3.6  Chloride 98 - 111 mmol/L 104 104 -  CO2 22 - 32 mmol/L 27 28 -  Calcium 8.9 - 10.3 mg/dL 8.5(L) 8.4(L) -   Total Protein 6.5 - 8.1 g/dL - - -  Total Bilirubin 0.3 - 1.2 mg/dL - - -  Alkaline Phos 38 - 126 U/L - - -  AST 15 - 41 U/L - - -  ALT 0 - 44 U/L - - -    CXR: -  _______________________________________________________________  Assessment and Plan: POD 1 s/p MIE (Ivor Lewis)  Neuro: pain controlled CV: stable Pulm: continue pulm hygiene Renal: creat stable.  Will stop IV fluids once on tube feeds GI: NPO.  Swallow on Friday.  Will start tube feeds today Heme: stable ID: [email or web address] Endo: SSI Dispo: 2C   Nathaniel Jennings Nathaniel Jennings 09/06/2021 7:26 AM

## 2021-09-06 NOTE — Progress Notes (Addendum)
1303- call to [facility] PA due to patients BP 79/48 recheck 81/46 - patient resting, awakens to voice, with no complaints of pain or nausea at this time. NS 250 bolus x1 ordered with verbal order to repeat if BP dose not respont.   1319 - repeat call to [facility] as BP currently not responding to fluid bolus, Myron to bedside to assess patient. 1st bolus of 250 still infusing, order to give second bolus of another 250,   1325 BP 86/56 (67)   1330 86/50 (62) 1335 - 77/53 (62) bolus continues

## 2021-09-06 NOTE — Progress Notes (Addendum)
      [address]       [place] [address]             [phone]      Called to see Nathaniel Jennings for evaluation of hypotension shortly after transfer from ICU.  On arrival to his room the MAP was 89mmHg.  Nathaniel Jennings is alert and denies chest pain or shortness of breath.  Skin is warm and dry.  Heart- NSR in the 80's Chest- breath sounds are clear, full, and equal.  ABD- soft, mild, tenderness Ext's- all warm and well perfused.   564ml NS IV bolus given and MAP remained ~55-60.  Suspect he is still volume contracted. Discussed with Dr. Kipp Brood- Will give additional albumin 5% 297ml x 3 units.  Also ordering Mg SO4  3gm for Mg++ 1.3 and will repeat level in am.   Macarthur Critchley, PA-C [phone]

## 2021-09-06 NOTE — Progress Notes (Signed)
1412 - Call to [facility] due to BP 78/54 MAP 63. 500 ml (total) bolus complete- advised to continue monitoring patient and no further actions to be taken at this time.

## 2021-09-06 NOTE — Progress Notes (Signed)
Initial Nutrition Assessment  DOCUMENTATION CODES:   Not applicable  INTERVENTION:   Tube Feeding via J-tube:  Osmolite 1.5 at 65 ml/hr Begin TF at 20 ml/hr; titrate by 10 mL q 8 hours until goal rate of 65 ml/hr Pro-source TF 45 mL daily Provides 109 g of protein, 2380 kcals and 1264 mL of free water  Once off IV hydration, if not take po, pt will require additional free water flushes of 250 mL q 6 hours   NUTRITION DIAGNOSIS:   Inadequate oral intake related to inability to eat, cancer and cancer related treatments as evidenced by NPO status.   GOAL:   Patient will meet greater than or equal to 90% of their needs   MONITOR:   TF tolerance, Diet advancement, Labs, Weight trends  REASON FOR ASSESSMENT:   Consult Assessment of nutrition requirement/status, Enteral/tube feeding initiation and management  ASSESSMENT:   65 yo male admitted with dx of adenocarcinoma of distal esophagus and esophagectomy with J-tube placement was performed. PMH includes tobacco and EtOH use, HTN, full dentures, GERD, dyslipidemia  11/14 Ivor Lewis Esophagectomy, Jejunostomy tube placement  Noted plan for Swallow study on Friday 11/18 NPO  Plan to initiate TF today via J-tube. Communicated plans to start TF at rate of 20 ml/hr today with RN; titration orders in TF order  Weight appears relatively stable over the past month.Current wt 66.3 kg; however it appears that pt has lost significant amount of weight from April of this year. UBW 81.8 kg. 19% wt loss  Pt is being followed by outpatient oncology RD; plan to communicate discharge plan with outpatient RD  Labs: reviewed Meds: ss novolog, D5-NS at 50 ml/hr    Diet Order:   Diet Order             Diet NPO time specified  Diet effective now                   EDUCATION NEEDS:   Not appropriate for education at this time  Skin:  Skin Assessment: Skin Integrity Issues: Skin Integrity Issues:: Incisions Incisions: chest,  neck, abdomen, new J-tube  Last BM:  no documented BM  Height:   Ht Readings from Last 1 Encounters:  09/05/21 5\' 6"  (1.676 m)    Weight:   Wt Readings from Last 1 Encounters:  09/06/21 66.3 kg    BMI:  Body mass index is 23.59 kg/m.  Estimated Nutritional Needs:   Kcal:  2300-2500 kcals  Protein:  100-120 g  Fluid:  >/= 2 L   Kerman Passey MS, RDN, LDN, CNSC Registered Dietitian III Clinical Nutrition RD Pager and On-Call Pager Number Located in [place]

## 2021-09-07 LAB — GLUCOSE, CAPILLARY
Glucose-Capillary: 111 mg/dL — ABNORMAL HIGH (ref 70–99)
Glucose-Capillary: 127 mg/dL — ABNORMAL HIGH (ref 70–99)
Glucose-Capillary: 128 mg/dL — ABNORMAL HIGH (ref 70–99)
Glucose-Capillary: 128 mg/dL — ABNORMAL HIGH (ref 70–99)
Glucose-Capillary: 132 mg/dL — ABNORMAL HIGH (ref 70–99)
Glucose-Capillary: 132 mg/dL — ABNORMAL HIGH (ref 70–99)
Glucose-Capillary: 148 mg/dL — ABNORMAL HIGH (ref 70–99)

## 2021-09-07 LAB — BASIC METABOLIC PANEL
Anion gap: 5 (ref 5–15)
BUN: 8 mg/dL (ref 8–23)
CO2: 26 mmol/L (ref 22–32)
Calcium: 7.8 mg/dL — ABNORMAL LOW (ref 8.9–10.3)
Chloride: 107 mmol/L (ref 98–111)
Creatinine, Ser: 0.69 mg/dL (ref 0.61–1.24)
GFR, Estimated: >60 mL/min (ref >60)
Glucose, Bld: 122 mg/dL — ABNORMAL HIGH (ref 70–99)
Potassium: 3.2 mmol/L — ABNORMAL LOW (ref 3.5–5.1)
Sodium: 138 mmol/L (ref 135–145)

## 2021-09-07 LAB — MAGNESIUM
Magnesium: 1.8 mg/dL (ref 1.7–2.4)
Magnesium: 1.9 mg/dL (ref 1.7–2.4)

## 2021-09-07 LAB — PHOSPHORUS
Phosphorus: 1 mg/dL — CL (ref 2.5–4.6)
Phosphorus: 1.4 mg/dL — ABNORMAL LOW (ref 2.5–4.6)

## 2021-09-07 MED ORDER — MAGNESIUM SULFATE 2 GM/50ML IV SOLN
2.0000 g | Freq: Once | INTRAVENOUS | Status: AC
  Administered 2021-09-07: 2 g via INTRAVENOUS
  Filled 2021-09-07: qty 50

## 2021-09-07 MED ORDER — POTASSIUM PHOSPHATES 15 MMOLE/5ML IV SOLN
30.0000 mmol | Freq: Once | INTRAVENOUS | Status: AC
  Administered 2021-09-07: 30 mmol via INTRAVENOUS
  Filled 2021-09-07: qty 10

## 2021-09-07 MED ORDER — POTASSIUM CHLORIDE 10 MEQ/100ML IV SOLN
10.0000 meq | INTRAVENOUS | Status: AC
  Administered 2021-09-07 (×5): 10 meq via INTRAVENOUS
  Filled 2021-09-07 (×4): qty 100

## 2021-09-07 MED ORDER — PHENOL 1.4 % MT LIQD: 1.0000 | OROMUCOSAL | Status: DC | PRN

## 2021-09-07 NOTE — Progress Notes (Signed)
   09/06/21 2300  Assess: MEWS Score  Temp 98.5 F (36.9 C)  BP 104/61  Pulse Rate (!) 106  ECG Heart Rate (!) 114  Resp 20  SpO2 93 %  O2 Device Nasal Cannula  Assess: MEWS Score  MEWS Temp 0  MEWS Systolic 0  MEWS Pulse 2  MEWS RR 0  MEWS LOC 0  MEWS Score 2  MEWS Score Color Yellow  Assess: if the MEWS score is Yellow or Red  Were vital signs taken at a resting state? Yes  Focused Assessment Change from prior assessment (see assessment flowsheet)  Early Detection of Sepsis Score *See Row Information* Low  MEWS guidelines implemented *See Row Information* No, vital signs rechecked  Notify: Charge Nurse/RN  Name of Charge Nurse/RN Notified Tanya, RN  Date Charge Nurse/RN Notified 09/06/21

## 2021-09-07 NOTE — Progress Notes (Signed)
Phos dropped to 1 this AM. D/w Jadene Pierini and we will give 79mmol of kphos. Check level in AM.   Onnie Boer, PharmD, BCIDP, AAHIVP, CPP Infectious Disease Pharmacist 09/07/2021 1:51 PM

## 2021-09-07 NOTE — Discharge Summary (Signed)
Physician Discharge Summary  Patient ID: Nathaniel Jennings MRN: [id number] DOB/AGE: [birth year] 65 y.o.  Admit date: 09/05/2021 Discharge date: 09/16/2021  Admission Diagnoses:esophageal carcinoma  Discharge Diagnoses:  Active Problems:   Hx of esophagectomy   Malnutrition of moderate degree  Patient Active Problem List   Diagnosis Date Noted   Malnutrition of moderate degree 09/09/2021   Hx of esophagectomy 09/05/2021   Port-A-Cath in place 07/18/2021   Adenopathy 05/23/2021   Malignant neoplasm of overlapping sites of esophagus ([place]) 05/09/2021   Essential hypertension 03/14/2013   Vertigo 03/14/2013   DYSLIPIDEMIA 11/27/2007   CHEST PAIN, ATYPICAL 11/27/2007    Discharged Condition: good  History of Present Illness:    Nathaniel Jennings 65 y.o. male with history of poorly differentiated adenocarcinoma the distal esophagus comes in in follow-up after completion of his neoadjuvant therapy.  Overall he is doing well his weight has been stable.  He is very anxious to proceed with surgery.  The patient and all relevant studies were evaluated by Dr. Kipp Brood who recommended proceeding with robotic esophagectomy.  He was admitted this hospitalization electively for the procedure.  Hospital course:  The patient was admitted electively and on 09/05/2021 he was taken to the operating room at which time he underwent robotic Ivor Lewis esophagectomy.  A jejunostomy tube was placed at the time of the procedure as well.  He tolerated it well was taken to the postanesthesia care unit in stable condition.  Postoperative hospital course:  On postoperative day #1 patient was seen by a nutrition and tube feedings via the jejunostomy tube have been initiated.  He is noted to have a postoperative expected acute loss anemia and is being monitored clinically.  Renal function has remained within normal limits.  He is hemodynamically stable and pain is under good control.  He was felt to be stable for  transfer to the [facility] progression unit.  On postop day 2 he was having difficulties with sore throat felt most likely related to his NG tube. He has has some hypotension which has improved with fluid bolus and albumin. He is tolerating tube feeds but has had some mild nausea. [name] He is somewhat slow in his mobilization d/t surgical pain but is showing steady improvement overall. NG tube was nighttime tube feedings 11/17.  The patient underwent esophogram on 11/18 which showed no evidence of anastomotic leak.  He was evaluated by SLP to teach proper swallowing mechanics.  He was started on a dysphagia diet.  Unfortunately he was unable to eat due to tube feedings be run too high a rate and for too long overnight.  This caused the patient abdominal discomfort and distention.  His feedings were adjusted to run at at reduced rate of 65 ml/hr from 7pm to 5am to facilitate a break and encourage oral intake.  The patient's abdominal discomfort and distention improved with reduction in tube feedings.  He was able to eat food throughout the day w/o difficulty.  He was progressed to a Dysphagia 3 diet.  We plan to continue nighttime tube feedings for the immediate future.  He was tachycardic and restarted on his home regimen of Toprol XL.  At the time of discharge the patient was felt to be quite stable.   Consults: None  Significant Diagnostic Studies: nuclear medicine:   IMPRESSION: 1. Interval development of a new 6 mm short axis right cervical node with hypermetabolism. Finding is somewhat indeterminate given the apparent response of the hypermetabolic right paratracheal lymphadenopathy seen  on the previous study. Close follow-up recommended as metastatic disease not excluded. 2. Diffuse hypermetabolic FDG accumulation noted in the esophagus today with more diffuse circumferential esophageal wall thickening on today's study than on the previous exam. Hypermetabolism in the distal esophagus at the level of the  hypermetabolic neoplasm previously has decreased from SUV max = 13 to SUV max = 7 today. 3. Stable tiny 3-4 mm right lower lobe and left upper lobe pulmonary nodules. Continued attention on follow-up recommended. 4. Cholelithiasis. 5. Emphysema.  (ICD10-J43.9) 6.  Aortic Atherosclerois (ICD10-170.0)    Treatments: surgery:   PRE-OPERATIVE DIAGNOSIS:  adenocarcinoma of the distal esophagus   POST-OPERATIVE DIAGNOSIS:  adenocarcinoma of the distal esophagus   PROCEDURE:  Procedure(s): XI ROBOTIC ASSISTED IVOR LEWIS ESOPHAGECTOMY (N/A) ESOPHAGOGASTRODUODENOSCOPY (EGD) (N/A) INTERCOSTAL NERVE BLOCK JEJUNOSTOMY J-TUBE PLACEMENT   SURGEON:  Surgeon(s) and Role:    * Lightfoot, Lucile Crater, MD - Primary   PHYSICIAN ASSISTANT: Geneva Pallas PA-C  Post Procedure Esophogram: DG ESOPHAGUS W SINGLE CM (SOL OR THIN BA)  Result Date: 09/09/2021 CLINICAL DATA:  Postop esophagectomy EXAM: ESOPHOGRAM/BARIUM SWALLOW TECHNIQUE: Single contrast examination was performed using  water soluble. FLUOROSCOPY TIME:  Fluoroscopy Time:  1 minute 30 seconds Radiation Exposure Index (if provided by the fluoroscopic device): 13.8 mGy Number of Acquired Spot Images: 5 series, 359 images. This includes multiple cine clips. COMPARISON:  Same day chest radiograph FINDINGS: Limited semi upright LPO and RPO esophagram due to patient condition. Scout radiograph demonstrates a right neck central venous catheter with tip overlying the distal superior vena cava and a left chest port catheter tip overlying the superior cavoatrial junction. There are right-sided thoracostomy tubes. There are postsurgical changes of Ivor Lewis esophagectomy with gastric pull-through. There is no evidence of extraluminal contrast extravasation. There is no evidence of aspiration. IMPRESSION: Postsurgical changes of Ivor Lewis esophagectomy with gastric pull-through. No evidence of contrast leak. Electronically Signed   By: Maurine Simmering M.D.   On:  09/09/2021 08:58      Discharge Exam: Blood pressure 121/76, pulse 98, temperature 98.7 F (37.1 C), temperature source Oral, resp. rate 18, height 5\' 6"  (1.676 m), weight 65.5 kg, SpO2 97 %.  General appearance: alert, cooperative, and no distress Heart: regular rate and rhythm and low grade tachy, low 100's Lungs: slightly dim in bases Abdomen: benign Extremities: no edema/calf tenderness Wound: some erethema, maceration with minor drainage of J tube site  Disposition:  Discharge disposition: 01-Home or Self Care      Discharge Instructions     Discharge patient   Complete by: As directed    When [facility] needs are in place   Discharge disposition: 01-Home or Self Care   Discharge patient date: 09/13/2021      Allergies as of 09/13/2021       Reactions   Bee Venom Anaphylaxis        Medication List     STOP taking these medications    lidocaine 2 % solution Commonly known as: XYLOCAINE   sucralfate 1 g tablet Commonly known as: Carafate       TAKE these medications    aspirin EC 81 MG tablet Take 81 mg by mouth in the morning.   feeding supplement (OSMOLITE 1.5 CAL) Liqd 1,000 mLs by Per J Tube route daily.   feeding supplement (PROSource TF) liquid Place 45 mLs into feeding tube 3 (three) times daily.   HYDROcodone-acetaminophen 7.5-325 mg/15 ml solution Commonly known as: HYCET Take 5 mLs by mouth  every 6 (six) hours as needed for up to 7 days for moderate pain. What changed:  how much to take when to take this   liver oil-zinc oxide 40 % ointment Commonly known as: DESITIN Apply topically 2 (two) times daily. To j tube site   menthol-cetylpyridinium 3 MG lozenge Commonly known as: CEPACOL Take 1 lozenge (3 mg total) by mouth as needed for sore throat.   metoprolol succinate 50 MG 24 hr tablet Commonly known as: TOPROL-XL Take 50 mg by mouth daily. Take with or immediately following a meal.   nystatin 100000 UNIT/ML suspension Commonly  known as: MYCOSTATIN Take 5 mLs (500,000 Units total) by mouth 4 (four) times daily.   pantoprazole 40 MG tablet Commonly known as: PROTONIX Take 1 tablet (40 mg total) by mouth 2 (two) times daily.   phenol 1.4 % Liqd Commonly known as: CHLORASEPTIC Use as directed 1 spray in the mouth or throat as needed for throat irritation / pain.   triamcinolone cream 0.1 % Commonly known as: KENALOG Apply 1 application topically 2 (two) times daily as needed (eczema).        Follow-up Information     Lightfoot, Lucile Crater, MD Follow up.   Specialty: Cardiothoracic Surgery Contact information: [place] [address] [phone]                 Signed: John Giovanni PA-C 09/16/2021, 1:40 PM

## 2021-09-07 NOTE — Plan of Care (Signed)
  Problem: Education: Goal: Knowledge of the prescribed therapeutic regimen will improve Outcome: Progressing   Problem: Bowel/Gastric: Goal: Gastrointestinal status for postoperative course will improve Outcome: Progressing   Problem: Nutritional: Goal: Ability to achieve adequate nutritional intake will improve Outcome: Progressing   Problem: Clinical Measurements: Goal: Postoperative complications will be avoided or minimized Outcome: Progressing   Problem: Respiratory: Goal: Ability to maintain a clear airway will improve Outcome: Progressing   Problem: Skin Integrity: Goal: Demonstration of wound healing without infection will improve Outcome: Progressing   Problem: Education: Goal: Knowledge of General Education information will improve Description: Including pain rating scale, medication(s)/side effects and non-pharmacologic comfort measures Outcome: Progressing   Problem: Health Behavior/Discharge Planning: Goal: Ability to manage health-related needs will improve Outcome: Progressing   Problem: Clinical Measurements: Goal: Ability to maintain clinical measurements within normal limits will improve Outcome: Progressing Goal: Will remain free from infection Outcome: Progressing Goal: Diagnostic test results will improve Outcome: Progressing Goal: Respiratory complications will improve Outcome: Progressing Goal: Cardiovascular complication will be avoided Outcome: Progressing   Problem: Activity: Goal: Risk for activity intolerance will decrease Outcome: Progressing   Problem: Nutrition: Goal: Adequate nutrition will be maintained Outcome: Progressing   Problem: Coping: Goal: Level of anxiety will decrease Outcome: Progressing   Problem: Elimination: Goal: Will not experience complications related to bowel motility Outcome: Progressing Goal: Will not experience complications related to urinary retention Outcome: Progressing   Problem: Pain  Managment: Goal: General experience of comfort will improve Outcome: Progressing   Problem: Safety: Goal: Ability to remain free from injury will improve Outcome: Progressing   Problem: Skin Integrity: Goal: Risk for impaired skin integrity will decrease Outcome: Progressing   

## 2021-09-07 NOTE — Progress Notes (Addendum)
[address]       [place] [address]             [phone]      2 Days Post-Op Procedure(s) (LRB): XI ROBOTIC ASSISTED IVOR LEWIS ESOPHAGECTOMY (N/A) ESOPHAGOGASTRODUODENOSCOPY (EGD) (N/A) INTERCOSTAL NERVE BLOCK JEJUNOSTOMY Subjective: Feeing uncomfortable, + sore throat  Objective: Vital signs in last 24 hours: Temp:  [98 F (36.7 C)-99.9 F (37.7 C)] 99.9 F (37.7 C) (11/16 0354) Pulse Rate:  [59-107] 107 (11/16 0354) Cardiac Rhythm: Normal sinus rhythm (11/15 1900) Resp:  [14-22] 19 (11/16 0354) BP: (75-107)/(46-78) 107/67 (11/16 0354) SpO2:  [92 %-100 %] 92 % (11/16 0354) Weight:  [67 kg] 67 kg (11/16 0354)  Hemodynamic parameters for last 24 hours:    Intake/Output from previous day: 11/15 0701 - 11/16 0700 In: 323.7 [I.V.:199.3; NG/GT:20; IV Piggyback:104.4] Out: 1150 [Urine:700; Emesis/NG output:300; Drains:20; Chest Tube:130] Intake/Output this shift: No intake/output data recorded.  General appearance: alert, cooperative, and no distress Heart: regular rate and rhythm Lungs: min dim left base Abdomen: benign Extremities: no edema or calf tenderness Wound: incis healing well  Lab Results: Recent Labs    09/05/21 2224 09/06/21 0336  WBC 7.7 8.0  HGB 9.1* 9.0*  HCT 27.0* 26.9*  PLT 89* 96*   BMET:  Recent Labs    09/06/21 0336 09/07/21 0510  NA 137 138  K 3.7 3.2*  CL 104 107  CO2 27 26  GLUCOSE 111* 122*  BUN 6* 8  CREATININE 0.74 0.69  CALCIUM 8.5* 7.8*    PT/INR: No results for input(s): LABPROT, INR in the last 72 hours. ABG    Component Value Date/Time   PHART 7.383 09/05/2021 1746   HCO3 26.4 09/05/2021 1746   TCO2 28 09/05/2021 1746   ACIDBASEDEF 0.1 09/02/2021 0923   O2SAT 97.0 09/05/2021 1746   CBG (last 3)  Recent Labs    09/06/21 2127 09/07/21 0010 09/07/21 0352  GLUCAP 95 132* 132*    Meds Scheduled Meds:  Chlorhexidine Gluconate Cloth  6 each Topical Daily   feeding supplement  (PROSource TF)  45 mL Per Tube Daily   insulin aspart  0-24 Units Subcutaneous Q4H   pantoprazole (PROTONIX) IV  40 mg Intravenous Q12H   Continuous Infusions:  sodium chloride 100 mL/hr at 09/06/21 2052   feeding supplement (OSMOLITE 1.5 CAL) 30 mL/hr at 09/06/21 1946   PRN Meds:.albuterol, ketorolac, morphine injection, ondansetron (ZOFRAN) IV  Xrays DG Chest Port 1 View  Result Date: 09/05/2021 CLINICAL DATA:  History of esophagectomy.  Follow up. EXAM: PORTABLE CHEST 1 VIEW COMPARISON:  09/02/2021 FINDINGS: Interval placement of RIGHT IJ central line, tip overlying the superior vena cava. Patient has stable LEFT-sided Port-A-Cath, tip overlying the level of the superior vena cava. Nasogastric tube has been placed, tip beyond the edge of the image. There is new mediastinal widening consistent with postoperative changes. Small amount of free intraperitoneal air and pneumomediastinum are likely postoperative. Lungs are clear. IMPRESSION: Postoperative changes. Small pneumomediastinum and small amount of free intraperitoneal air likely postoperative. Electronically Signed   By: Nolon Nations M.D.   On: 09/05/2021 15:54    Assessment/Plan: S/P Procedure(s) (LRB): XI ROBOTIC ASSISTED IVOR LEWIS ESOPHAGECTOMY (N/A) ESOPHAGOGASTRODUODENOSCOPY (EGD) (N/A) INTERCOSTAL NERVE BLOCK JEJUNOSTOMY  1 Tmax  99.9,  some hypotension, received fluids and albumin. Sinus tachy in 120's at times 2 sats good on 2 liters 3 K= 3.2 , will replace 4 MG++ 1.8 , will replace 5  normal renal fxn, UOP a bit low, follow may need some additional fluids 6 BS well controlled 7 NGT 300 cc- bilious- keep in place till swallow 8 tolerating J tube feeds  9 CT no air leak, minor drainage, 130, JP 20 cc 10 routine pulm toilet/rehab 11 toradol amd Morphine for pain        LOS: 2 days    Nathaniel Jennings 09/07/2021    No events Tolerating tube feeds, but feels nauseous.  Will hold tube feeds at current  level Ambulation today Swallow on Friday  Nathaniel Jennings

## 2021-09-07 NOTE — TOC Progression Note (Addendum)
Transition of Care [facility]) - Progression Note    Patient Details  Name: Nathaniel Jennings MRN: [id number] Date of Birth: 1956-05-29  Transition of Care [facility]) CM/SW Contact  Zenon Mayo, RN Phone Number: 09/07/2021, 6:06 PM  Clinical Narrative:    from home alone, s/p esphagectomy, chest tube to water seal, jp drain , ng tube to wall suction, tube feeds at 50 , goal is 65. TOC will cont to follow for dc needs.        Expected Discharge Plan and Services                                                 Social Determinants of Health (SDOH) Interventions    Readmission Risk Interventions No flowsheet data found.

## 2021-09-08 ENCOUNTER — Inpatient Hospital Stay (HOSPITAL_COMMUNITY): Payer: BC Managed Care – PPO

## 2021-09-08 LAB — GLUCOSE, CAPILLARY
Glucose-Capillary: 125 mg/dL — ABNORMAL HIGH (ref 70–99)
Glucose-Capillary: 125 mg/dL — ABNORMAL HIGH (ref 70–99)
Glucose-Capillary: 134 mg/dL — ABNORMAL HIGH (ref 70–99)
Glucose-Capillary: 139 mg/dL — ABNORMAL HIGH (ref 70–99)
Glucose-Capillary: 143 mg/dL — ABNORMAL HIGH (ref 70–99)
Glucose-Capillary: 70 mg/dL (ref 70–99)

## 2021-09-08 LAB — CBC WITH DIFFERENTIAL/PLATELET
Abs Immature Granulocytes: 0.02 10*3/uL (ref 0.00–0.07)
Basophils Absolute: 0 10*3/uL (ref 0.0–0.1)
Basophils Relative: 0 %
Eosinophils Absolute: 0.1 10*3/uL (ref 0.0–0.5)
Eosinophils Relative: 2 %
HCT: 24.3 % — ABNORMAL LOW (ref 39.0–52.0)
Hemoglobin: 8 g/dL — ABNORMAL LOW (ref 13.0–17.0)
Immature Granulocytes: 1 %
Lymphocytes Relative: 6 %
Lymphs Abs: 0.3 10*3/uL — ABNORMAL LOW (ref 0.7–4.0)
MCH: 34.8 pg — ABNORMAL HIGH (ref 26.0–34.0)
MCHC: 32.9 g/dL (ref 30.0–36.0)
MCV: 105.7 fL — ABNORMAL HIGH (ref 80.0–100.0)
Monocytes Absolute: 0.3 10*3/uL (ref 0.1–1.0)
Monocytes Relative: 7 %
Neutro Abs: 3.8 10*3/uL (ref 1.7–7.7)
Neutrophils Relative %: 84 %
Platelets: 90 10*3/uL — ABNORMAL LOW (ref 150–400)
RBC: 2.3 MIL/uL — ABNORMAL LOW (ref 4.22–5.81)
RDW: 12.8 % (ref 11.5–15.5)
WBC: 4.4 10*3/uL (ref 4.0–10.5)
nRBC: 0 % (ref 0.0–0.2)

## 2021-09-08 LAB — BASIC METABOLIC PANEL
Anion gap: 5 (ref 5–15)
BUN: 10 mg/dL (ref 8–23)
CO2: 26 mmol/L (ref 22–32)
Calcium: 7.6 mg/dL — ABNORMAL LOW (ref 8.9–10.3)
Chloride: 106 mmol/L (ref 98–111)
Creatinine, Ser: 0.53 mg/dL — ABNORMAL LOW (ref 0.61–1.24)
GFR, Estimated: >60 mL/min (ref >60)
Glucose, Bld: 121 mg/dL — ABNORMAL HIGH (ref 70–99)
Potassium: 3.4 mmol/L — ABNORMAL LOW (ref 3.5–5.1)
Sodium: 137 mmol/L (ref 135–145)

## 2021-09-08 LAB — PHOSPHORUS: Phosphorus: 1.8 mg/dL — ABNORMAL LOW (ref 2.5–4.6)

## 2021-09-08 MED ORDER — POTASSIUM PHOSPHATES 15 MMOLE/5ML IV SOLN
30.0000 mmol | Freq: Once | INTRAVENOUS | Status: AC
  Administered 2021-09-08: 11:00:00 30 mmol via INTRAVENOUS
  Filled 2021-09-08: qty 10

## 2021-09-08 MED ORDER — MAGIC MOUTHWASH
5.0000 mL | Freq: Three times a day (TID) | ORAL | Status: DC | PRN
  Administered 2021-09-08 – 2021-09-13 (×11): 5 mL via ORAL
  Filled 2021-09-08 (×13): qty 5

## 2021-09-08 MED ORDER — POTASSIUM CHLORIDE 10 MEQ/50ML IV SOLN
10.0000 meq | INTRAVENOUS | Status: DC
  Administered 2021-09-08: 08:00:00 10 meq via INTRAVENOUS
  Filled 2021-09-08: qty 50

## 2021-09-08 MED ORDER — MENTHOL 3 MG MT LOZG
1.0000 | LOZENGE | OROMUCOSAL | Status: DC | PRN
  Filled 2021-09-08: qty 9
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
## 2021-09-08 NOTE — Evaluation (Signed)
Physical Therapy Evaluation Patient Details Name: Nathaniel Jennings MRN: [id number] DOB: 1955-11-14 Today's Date: 09/08/2021  History of Present Illness  The pt is a 65 yo male presenting 11/14 for esophagectomy with J-tube placement and EGD due to adenocarcinoma of the distal esophagus. PMH includes: GERD, HTN, OSA, and has previously underwent neoadjuvant therapy and biopsy of mass.   Clinical Impression  Pt in bed upon arrival of PT, agreeable to evaluation at this time. Prior to admission the pt was completely independent, living alone, and enjoyed activities such as playing golf and riding motorcycles. The pt now presents with limitations in functional mobility, strength, power, dynamic stability, and activity tolerance due to above dx, and will continue to benefit from skilled PT to address these deficits. The pt was able to complete sit-stand from EOB with only minA of 1 and single UE support, but required single UE support to maintain balance for short steps in the room, and was unable to progress to longer ambulation due to poor activity tolerance and increase in pain with activity at this time. HR increased from 110s to 148bpm with activity. Suspect pt will progress well with improved pain control and removal of lines such as chest tube and will be able to return home with assist of friend/PCA once medically stable.     Recommendations for follow up therapy are one component of a multi-disciplinary discharge planning process, led by the attending physician.  Recommendations may be updated based on patient status, additional functional criteria and insurance authorization.  Follow Up Recommendations No PT follow up    Assistance Recommended at Discharge Intermittent Supervision/Assistance  Functional Status Assessment Patient has had a recent decline in their functional status and demonstrates the ability to make significant improvements in function in a reasonable and predictable amount of  time.  Equipment Recommendations  Rollator (4 wheels);BSC/3in1    Recommendations for Other Services       Precautions / Restrictions Precautions Precautions: Fall Precaution Comments: R chest tube, JPP drain, watch HR Restrictions Weight Bearing Restrictions: No      Mobility  Bed Mobility Overal bed mobility: Needs Assistance Bed Mobility: Supine to Sit     Supine to sit: Min assist     General bed mobility comments: minA to complete movements, pt able to complete scooting to EOB    Transfers Overall transfer level: Needs assistance Equipment used: 1 person hand held assist Transfers: Sit to/from Stand Sit to Stand: Min assist           General transfer comment: minA to complete sit-stand x2 through session.    Ambulation/Gait Ambulation/Gait assistance: Min guard Gait Distance (Feet): 3 Feet (+5 ft) Assistive device: IV Pole;None Gait Pattern/deviations: Step-to pattern;Decreased stride length Gait velocity: decreased Gait velocity interpretation: <1.31 ft/sec, indicative of household ambulator   General Gait Details: pt with short strides and minimal clearance. good stability with and without single UE support. limited by pain. HR to 148bpm    Balance Overall balance assessment: Mild deficits observed, not formally tested                                           Pertinent Vitals/Pain Pain Assessment: 0-10 Pain Score: 8  Pain Location: chest tube and JP drain site Pain Descriptors / Indicators: Discomfort;Sore Pain Intervention(s): Limited activity within patient's tolerance;Monitored during session;Repositioned    Home Living Family/patient expects to  be discharged to:: Private residence Living Arrangements: Alone Available Help at Discharge: Personal care attendant Type of Home: House Home Access: Stairs to enter Entrance Stairs-Rails: Psychiatric nurse of Steps: 2   Home Layout: One level Home Equipment:  None      Prior Function Prior Level of Function : Independent/Modified Independent;Driving             Mobility Comments: pt independent without DME, living alone, enjoys playing golf and riding motorcycles       Hand Dominance   Dominant Hand: Right    Extremity/Trunk Assessment   Upper Extremity Assessment Upper Extremity Assessment: Overall WFL for tasks assessed    Lower Extremity Assessment Lower Extremity Assessment: Overall WFL for tasks assessed    Cervical / Trunk Assessment Cervical / Trunk Assessment: Normal;Other exceptions Cervical / Trunk Exceptions: chest tube and JP drain  Communication   Communication: No difficulties  Cognition Arousal/Alertness: Awake/alert Behavior During Therapy: WFL for tasks assessed/performed Overall Cognitive Status: Within Functional Limits for tasks assessed                                 General Comments: pt following all cues, able to answer questions appropriately        General Comments General comments (skin integrity, edema, etc.): HR to 148bpm with gait, 144bpm with coughing. bloody sputum, RN notified        Assessment/Plan    PT Assessment Patient needs continued PT services  PT Problem List Decreased strength;Decreased range of motion;Decreased activity tolerance;Decreased balance;Pain;Cardiopulmonary status limiting activity       PT Treatment Interventions      PT Goals (Current goals can be found in the Care Plan section)  Acute Rehab PT Goals Patient Stated Goal: return to playing golf and riding motorcycles PT Goal Formulation: With patient Time For Goal Achievement: 09/22/21 Potential to Achieve Goals: Good    Frequency Min 3X/week    AM-PAC PT "6 Clicks" Mobility  Outcome Measure Help needed turning from your back to your side while in a flat bed without using bedrails?: A Little Help needed moving from lying on your back to sitting on the side of a flat bed without using  bedrails?: A Little Help needed moving to and from a bed to a chair (including a wheelchair)?: A Little Help needed standing up from a chair using your arms (e.g., wheelchair or bedside chair)?: A Little Help needed to walk in hospital room?: A Little Help needed climbing 3-5 steps with a railing? : A Lot 6 Click Score: 17    End of Session Equipment Utilized During Treatment: Gait belt;Oxygen (pt taken off O2 during session) Activity Tolerance: Patient tolerated treatment well Patient left: in chair;with call bell/phone within reach Nurse Communication: Mobility status PT Visit Diagnosis: Other abnormalities of gait and mobility (R26.89);Pain;Muscle weakness (generalized) (M62.81);Unsteadiness on feet (R26.81) Pain - Right/Left: Right    Time: 1530-1606 PT Time Calculation (min) (ACUTE ONLY): 36 min   Charges:   PT Evaluation $PT Eval Moderate Complexity: 1 Mod PT Treatments $Therapeutic Activity: 8-22 mins        West Carbo, PT, DPT   Acute Rehabilitation Department Pager #: [phone]  Nathaniel Jennings 09/08/2021, 4:17 PM

## 2021-09-08 NOTE — Progress Notes (Addendum)
      [address]       [place] [address]             [phone]       3 Days Post-Op Procedure(s) (LRB): XI ROBOTIC ASSISTED IVOR LEWIS ESOPHAGECTOMY (N/A) ESOPHAGOGASTRODUODENOSCOPY (EGD) (N/A) INTERCOSTAL NERVE BLOCK JEJUNOSTOMY  Subjective: Patient sleeping and awakened. He has some incisional pain. His throat is very sore. He denies nausea.  Objective: Vital signs in last 24 hours: Temp:  [99.1 F (37.3 C)-99.6 F (37.6 C)] 99.3 F (37.4 C) (11/17 0411) Pulse Rate:  [96-114] 96 (11/17 0411) Cardiac Rhythm: Normal sinus rhythm (11/16 1902) Resp:  [16-20] 17 (11/17 0411) BP: (99-123)/(61-76) 102/69 (11/17 0411) SpO2:  [92 %-98 %] 96 % (11/17 0411) Weight:  [67 kg] 67 kg (11/17 0411)     Intake/Output from previous day: 11/16 0701 - 11/17 0700 In: 1384.8 [NG/GT:1224.8] Out: 1440 [Urine:550; Emesis/NG output:440; Drains:50; Chest Tube:400]   Physical Exam:  Cardiovascular: RRR Pulmonary: Slightly diminished bibasilar breath sounds Abdomen: Soft, non tender, bowel sounds present. Extremities: No lower extremity edema. Wounds: Clean and dry.  No erythema or signs of infection. Dressing around J tube with light yellowish drainage Chest Tube: to suction  Lab Results: CBC: Recent Labs    09/05/21 2224 09/06/21 0336  WBC 7.7 8.0  HGB 9.1* 9.0*  HCT 27.0* 26.9*  PLT 89* 96*   BMET:  Recent Labs    09/07/21 0510 09/08/21 0459  NA 138 137  K 3.2* 3.4*  CL 107 106  CO2 26 26  GLUCOSE 122* 121*  BUN 8 10  CREATININE 0.69 0.53*  CALCIUM 7.8* 7.6*    PT/INR: No results for input(s): LABPROT, INR in the last 72 hours. ABG:  INR: Will add last result for INR, ABG once components are confirmed Will add last 4 CBG results once components are confirmed  Assessment/Plan:  1. CV - SR. 2.  Pulmonary -  Chest tube with 400 ml last 24 hours. CXR this am appears stable. Encourage incentive spirometer. 3. GI-NPO, TFs. NGT with 440. 4.  CBGs 128/128/125. No history of DM but on TFs so will continue 5. Supplement potassium 6. Magic mouthwash for throat  Donielle M ZimmermanPA-C 09/08/2021,6:53 AM [phone]   Agree with above. Swallow study tomorrow.  Terrian Ridlon Bary Leriche

## 2021-09-08 NOTE — Plan of Care (Signed)
  Problem: Education: Goal: Knowledge of the prescribed therapeutic regimen will improve Outcome: Progressing   Problem: Bowel/Gastric: Goal: Gastrointestinal status for postoperative course will improve Outcome: Progressing   Problem: Nutritional: Goal: Ability to achieve adequate nutritional intake will improve Outcome: Progressing   Problem: Clinical Measurements: Goal: Postoperative complications will be avoided or minimized Outcome: Progressing

## 2021-09-08 NOTE — Op Note (Signed)
[address]       [place] [address]             [phone]        09/08/2021  Patient:  Nathaniel Jennings Pre-Op Dx: Lower esophageal adenocarcinoma    Patient is status post neoadjuvant chemotherapy and radiation Post-op Dx: Same Procedure: - Esophagoscopy - Robotic assisted laparoscopy - Robotic assisted thoracoscopy - Ivor-Lewis esophagectomy -Pyloroplasty - Laparoscopic jejunostomy tube placement 20F - Intercostal nerve block  Surgeon and Role:      * Londell Noll, Lucile Crater, MD - Primary    Evonnie Pat, PA-C- assisting  Anesthesia  general EBL: 100 ml Blood Administration: None Specimen: Esophagogastrectomy.  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  He has undergone neoadjuvant chemotherapy and has demonstrated regression and decreased avidity in these cervical nodes.  Additionally he underwent an ultrasound-guided biopsy of a new right-sided cervical lymph node, and the results were negative.  The PET/CT was concerning for diffuse uptake along the entire esophagus which could be due to a fungal infection given his recent bout with tongue candidiasis.  He is scheduled to undergo an EGD next week for confirmation of this.  Given the fact that his cervical lymph node was negative I think that he is safe to proceed with an EGD and robotic assisted Ivor Lewis esophagectomy with jejunostomy tube placement.  He is agreeable to proceed and is scheduled for mid November.   Findings: Esophageal mass was noted.  There was a good response from the neoadjuvant therapy.  Was difficult getting into the lesser sac from the greater omentum.  We were able to take vascularized pedicle of omentum up for the anastomosis.  Operative Technique: After the risks, benefits and alternatives were thoroughly  discussed, the patient was brought to the operative theatre.  Anesthesia was induced, and the esophagoscope was passed through the oropharynx down to the stomach.  The scope was retroflexed.  On retrograde examination of the esophagus, there was a good response from neoadjuvant therapy.  The scope was then parked at 25 cm from the incisors.  The patient was then prepped and draped in normal sterile fashion.  An appropriate surgical pause was performed, and pre-operative antibiotics were dosed accordingly.  We began with a 1 cm incision 15 cm caudad from the xiphoid and slightly lateral to the umbilicus.  Using an Optiview we entered the peritoneal space.  The abdomen was then insufflated with CO2.  3 other robotic ports were placed to triangulate the hiatus.  Another 12 mm port was placed in place at the level of the umbilicus laterally for an assistant port and another 5 mm trocar was placed in the right lower quadrant for liver retractor.  The patient was then placed in steep reverse Trendelenburg and the liver was elevated to expose the esophageal hiatus.  And then the robot was docked.  We began by dividing the gastrohepatic ligament to expose the right diaphragmatic crus and then dissected the esophagus into the mediastinum.  We then divided the short gastrics and moved towards the right crus and completed our dissection along the esophageal hiatus.  A Penrose drain was then used to encircle the the esophagus and we continued our dissection up into the mediastinum.  The stomach was then retracted superiorly, and we  mobilized it off of the pancreas.  The left gastric artery was then isolated and divided with a robotic stabler.  ICG was then injected through his central line, and good blood flow to the stomach was evident.  We then marked an area away from the right gastroepiploic artery, and began to divide the omentum.    Once we achieved good mobilization, we focused our attention on the pylorus.  A 3cm  longitudinal incision in the pylorus was made, and the lumen was entered.  A pyloroplasty was closed transversely with V-Loc suture.  The endoscope was then passed back down into the stomach and November able to pass through the pyloromyotomy into the duodenum without difficulty.  We then began to tubularized the gastric conduit with several fires of the robotic stapler.  Once complete, the conduit was then attached to the distal end of the specimen.  We then undocked the robot, after removing all instruments, and the liver retractor, and then focused our attention placement of the jejunostomy tube.  The ligament of Treitz was identified, and a portion of small bowel about 20-30cm distal was used.  Corner stiches were placed, and passed out through the abdominal wall.  Using Seldinger technique, we access the small bowel and confirmed its position with insufflation.  The tract was sequentially dilated, and we passed a 76F jejunostomy tube distally.  The sutures were secured, and another proximal stitch was placed to prevent torsion.  The pneumoperitoneum was released, and all ports were removed.  The incisions were closed with absorbable suture.  The esophagoscope was removed.  The patient was then placed in a left lateral decubitus position.  The robotic ports were placed to triangulate the esophagus.  We continued our mobilization of the esophagus up to the azygous vein.  The azygous vein was divided with a robotic stapler.  The esophagus was then mobilized circumferentially, and then divided at the level of the azygous vein.  Gastric conduit was then brought up into the chest, and a gastrotomy was created.  Using the robotic stapler an anastomosis was created using a stapled back row and handsewn front row anastomosis.  The omental pedicle flap was then sutured over the anastomosis.  The ports were removed, and a 23F chest tube was placed.  An intercostal nerve block was performed.  The lung was expanded.   The incisions were closed with absorbable suture.    The patient was then placed back in a supine position, an NG tube was placed with direct visualization using the endoscope.  The patient tolerated the procedure without any immediate complications, and was transferred to the PACU in stable condition.  Maleah Rabago Bary Leriche

## 2021-09-09 ENCOUNTER — Inpatient Hospital Stay (HOSPITAL_COMMUNITY): Payer: BC Managed Care – PPO

## 2021-09-09 DIAGNOSIS — E440 Moderate protein-calorie malnutrition: Secondary | ICD-10-CM | POA: Insufficient documentation

## 2021-09-09 LAB — BASIC METABOLIC PANEL
Anion gap: 4 — ABNORMAL LOW (ref 5–15)
BUN: 11 mg/dL (ref 8–23)
CO2: 26 mmol/L (ref 22–32)
Calcium: 8 mg/dL — ABNORMAL LOW (ref 8.9–10.3)
Chloride: 110 mmol/L (ref 98–111)
Creatinine, Ser: 0.56 mg/dL — ABNORMAL LOW (ref 0.61–1.24)
GFR, Estimated: >60 mL/min (ref >60)
Glucose, Bld: 110 mg/dL — ABNORMAL HIGH (ref 70–99)
Potassium: 3.8 mmol/L (ref 3.5–5.1)
Sodium: 140 mmol/L (ref 135–145)

## 2021-09-09 LAB — GLUCOSE, CAPILLARY
Glucose-Capillary: 121 mg/dL — ABNORMAL HIGH (ref 70–99)
Glucose-Capillary: 91 mg/dL (ref 70–99)
Glucose-Capillary: 65 mg/dL — ABNORMAL LOW (ref 70–99)
Glucose-Capillary: 113 mg/dL — ABNORMAL HIGH (ref 70–99)
Glucose-Capillary: 68 mg/dL — ABNORMAL LOW (ref 70–99)
Glucose-Capillary: 133 mg/dL — ABNORMAL HIGH (ref 70–99)
Glucose-Capillary: 95 mg/dL (ref 70–99)

## 2021-09-09 MED ORDER — POTASSIUM CHLORIDE 10 MEQ/100ML IV SOLN
10.0000 meq | INTRAVENOUS | Status: AC
  Administered 2021-09-09 (×3): 10 meq via INTRAVENOUS
  Filled 2021-09-09 (×3): qty 100

## 2021-09-09 MED ORDER — OSMOLITE 1.5 CAL PO LIQD: 1000.0000 mL | ORAL | Status: DC

## 2021-09-09 MED ORDER — OSMOLITE 1.5 CAL PO LIQD
1000.0000 mL | ORAL | Status: DC
  Administered 2021-09-09: 1000 mL

## 2021-09-09 MED ORDER — DIATRIZOATE MEGLUMINE & SODIUM 66-10 % PO SOLN
120.0000 mL | Freq: Once | ORAL | Status: AC
  Administered 2021-09-09: 120 mL via ORAL
  Filled 2021-09-09: qty 120

## 2021-09-09 MED ORDER — BOOST / RESOURCE BREEZE PO LIQD CUSTOM
1.0000 | Freq: Three times a day (TID) | ORAL | Status: DC
  Administered 2021-09-09 – 2021-09-13 (×3): 1 via ORAL

## 2021-09-09 MED ORDER — PROSOURCE TF PO LIQD
45.0000 mL | Freq: Two times a day (BID) | ORAL | Status: DC
  Administered 2021-09-09 – 2021-09-12 (×3): 45 mL
  Filled 2021-09-09 (×4): qty 45

## 2021-09-09 NOTE — Plan of Care (Signed)
  Problem: Education: Goal: Knowledge of the prescribed therapeutic regimen will improve Outcome: Progressing   Problem: Bowel/Gastric: Goal: Gastrointestinal status for postoperative course will improve Outcome: Progressing   Problem: Nutritional: Goal: Ability to achieve adequate nutritional intake will improve Outcome: Progressing   Problem: Clinical Measurements: Goal: Postoperative complications will be avoided or minimized Outcome: Progressing   Problem: Respiratory: Goal: Ability to maintain a clear airway will improve Outcome: Progressing   Problem: Skin Integrity: Goal: Demonstration of wound healing without infection will improve Outcome: Progressing   Problem: Education: Goal: Knowledge of General Education information will improve Description: Including pain rating scale, medication(s)/side effects and non-pharmacologic comfort measures Outcome: Progressing   Problem: Health Behavior/Discharge Planning: Goal: Ability to manage health-related needs will improve Outcome: Progressing   Problem: Clinical Measurements: Goal: Ability to maintain clinical measurements within normal limits will improve Outcome: Progressing Goal: Will remain free from infection Outcome: Progressing Goal: Diagnostic test results will improve Outcome: Progressing Goal: Respiratory complications will improve Outcome: Progressing Goal: Cardiovascular complication will be avoided Outcome: Progressing   Problem: Activity: Goal: Risk for activity intolerance will decrease Outcome: Progressing   Problem: Nutrition: Goal: Adequate nutrition will be maintained Outcome: Progressing   Problem: Coping: Goal: Level of anxiety will decrease Outcome: Progressing   Problem: Elimination: Goal: Will not experience complications related to bowel motility Outcome: Progressing Goal: Will not experience complications related to urinary retention Outcome: Progressing   Problem: Pain  Managment: Goal: General experience of comfort will improve Outcome: Progressing   Problem: Safety: Goal: Ability to remain free from injury will improve Outcome: Progressing   Problem: Skin Integrity: Goal: Risk for impaired skin integrity will decrease Outcome: Progressing   

## 2021-09-09 NOTE — Progress Notes (Signed)
Nutrition Follow-up  DOCUMENTATION CODES:   Non-severe (moderate) malnutrition in context of chronic illness  INTERVENTION:   Begin cycling tube feeds via J-tube: - Osmolite 1.5 @ 85 ml/hr to run over 16 hours from 1800 to 1000 (total of 1360 ml) - ProSource TF 45 ml BID  Nocturnal tube feeding regimen over 16 hours provides 2120 kcal, 107 grams of protein, and 1036 ml of H2O (meets 92% of kcal needs and 100% of protein needs).   If pt is able to tolerate the above tube feeding regimen, recommend cycling tube feeds over 12 hours: - Osmolite 1.5 @ 110 ml/hr to run over 12 hours from 2000 to 0800 (total of 1320 ml) - ProSource TF 45 ml BID  Nocturnal tube feeding regimen over 12 hours provides 2060 kcal, 105 grams of protein, and 1006 ml of H2O (meets 90% of kcal needs and 100% of protein needs).   - Encourage PO intake of dysphagia 1 diet with thin liquids  - Boost Breeze po TID, each supplement provides 250 kcal and 9 grams of protein  NUTRITION DIAGNOSIS:   Moderate Malnutrition related to chronic illness (esophageal cancer) as evidenced by moderate fat depletion, moderate muscle depletion, percent weight loss (13.3% weight loss in less than 4 months).  Ongoing, being addressed via tube feeds and diet advancement  GOAL:   Patient will meet greater than or equal to 90% of their needs  Met via tube feeds  MONITOR:   PO intake, Supplement acceptance, Diet advancement, Labs, Weight trends, TF tolerance, I & O's  REASON FOR ASSESSMENT:   Consult Assessment of nutrition requirement/status, Enteral/tube feeding initiation and management  ASSESSMENT:   65 yo male admitted with dx of adenocarcinoma of distal esophagus and esophagectomy with J-tube placement was performed. PMH includes tobacco and EtOH use, HTN, full dentures, GERD, dyslipidemia  11/14 - s/p Ivor Lewis esophagectomy, jejunostomy tube placement 11/15 - tube feeds initiated 11/17 - NG tube removed 11/18 -  esophagram with no evidence of leak, diet advanced to dysphagia 1 with thin liquids  Consult received to begin cycling tube feeds. Diet advanced to dysphagia 1 with thin liquids this morning. Tube feeds off at time of RD visit due to bottle of Osmolite 1.5 being empty. RD called down to Pharmacy who will send up a new bottle of Osmolite 1.5.  Discussed plan with RN and pt. Will start by cycling tube feeds of 16 hours. Tube feeds to be off from 1000 to 1800. Tube feeds will run nocturnally from 1800 to 1000 at 85 ml/hr. If pt able to tolerate, recommend cycling over 12 hours.  RN reports pt ate pudding and applesauce from lunch tray. Pt confirms consuming 100% of pudding, 100% of applesauce, and 50% of pureed fruit from lunch meal tray. Dicussed oral nutrition supplement options with pt. Pt would not like to receive Ensure Enlive supplements as he has had these many times before. Pt willing to try Boost Breeze. Will order TID.  Admit weight: 65.8 kg Current weight: 69.4 kg  Current TF: Osmolite 1.5 @ 65 ml/hr, ProSource TF 45 ml daily  Medications reviewed and include: SSI q 4 hours, IV protonix, IV KCl 10 mEq x 3 runs  Labs reviewed. CBG's: 70-143 x 24 hours  UOP: 1500 ml x 24 hours R chest JP drain: 240 ml x 24 hours CT: 270 ml x 24 hours I/O's: +3.2 L since admit  Diet Order:   Diet Order  DIET - DYS 1 Room service appropriate? Yes; Fluid consistency: Thin  Diet effective now                   EDUCATION NEEDS:   Education needs have been addressed  Skin:  Skin Assessment: Skin Integrity Issues: Incisions: chest, neck, abdomen, new J-tube  Last BM:  09/09/21 small type 6  Height:   Ht Readings from Last 1 Encounters:  09/05/21 _0  (1.676 m)    Weight:   Wt Readings from Last 1 Encounters:  09/09/21 69.4 kg    BMI:  Body mass index is 24.69 kg/m.  Estimated Nutritional Needs:   Kcal:  2300-2500 kcals  Protein:  100-120 grams  Fluid:  >/=  2 L    Nathaniel Bryant, MS, RD, LDN Inpatient Clinical Dietitian Please see AMiON for contact information.

## 2021-09-09 NOTE — Progress Notes (Addendum)
      [address]       [place] [address]             [phone]       4 Days Post-Op Procedure(s) (LRB): XI ROBOTIC ASSISTED IVOR LEWIS ESOPHAGECTOMY (N/A) ESOPHAGOGASTRODUODENOSCOPY (EGD) (N/A) INTERCOSTAL NERVE BLOCK JEJUNOSTOMY  Subjective: Patient having chest tube dressing changed this am. He denies nausea.  Objective: Vital signs in last 24 hours: Temp:  [98.5 F (36.9 C)-99.6 F (37.6 C)] 98.5 F (36.9 C) (11/18 0350) Pulse Rate:  [97-113] 98 (11/18 0350) Cardiac Rhythm: Normal sinus rhythm (11/18 0439) Resp:  [18-20] 18 (11/18 0350) BP: (90-146)/(65-85) 114/65 (11/18 0350) SpO2:  [93 %-97 %] 96 % (11/18 0350) Weight:  [69.4 kg] 69.4 kg (11/18 0658)     Intake/Output from previous day: 11/17 0701 - 11/18 0700 In: 5397 [I.V.:3389.9; NG/GT:1507.1; IV Piggyback:500] Out: 2010 [Urine:1500; Drains:240; Chest Tube:270]   Physical Exam:  Cardiovascular: RRR Pulmonary: Coarse on the right Abdomen: Soft, non tender, bowel sounds present. Extremities: No lower extremity edema. Wounds: Clean and dry.  No erythema or signs of infection. Dressing around J tube clean and dry. Chest Tube: to suction  Lab Results: CBC: Recent Labs    09/08/21 0644  WBC 4.4  HGB 8.0*  HCT 24.3*  PLT 90*    BMET:  Recent Labs    09/08/21 0459 09/09/21 0444  NA 137 140  K 3.4* 3.8  CL 106 110  CO2 26 26  GLUCOSE 121* 110*  BUN 10 11  CREATININE 0.53* 0.56*  CALCIUM 7.6* 8.0*     PT/INR: No results for input(s): LABPROT, INR in the last 72 hours. ABG:  INR: Will add last result for INR, ABG once components are confirmed Will add last 4 CBG results once components are confirmed  Assessment/Plan:  1. CV - ST at times. 2.  Pulmonary -  On 2 liters of oxygen via . Wean as able. Chest tube with 270 ml last 24 hours. CXR this am appears stable. JP drain with sero sanguinous drainage 240 cc alst 24 hours. Encourage incentive spirometer. 3. GI-NPO,  TFs at 65 ml/hr Esophagram ordered. 4. CBGs 139/125/121. No history of DM but on TFs so will continue 5. Supplement potassium 6. Patient with poor venous access. Peripheral IV removed this am as stopped working. Has right IJ central line only. Will discuss with Dr. Kipp Brood.  Donielle M ZimmermanPA-C 09/09/2021,7:04 AM [phone]   Agree with above. Swallow study is negative for leak. Will start on diet. Will remove a large bore chest tube today. Dispo planning.  Jourdin Connors Bary Leriche

## 2021-09-09 NOTE — Progress Notes (Signed)
Physical Therapy Treatment Patient Details Name: Nathaniel Jennings MRN: [id number] DOB: [birth year] Today's Date: 09/09/2021   History of Present Illness The pt is a 65 yo male presenting 11/14 for esophagectomy with J-tube placement and EGD due to adenocarcinoma of the distal esophagus. PMH includes: GERD, HTN, OSA, and has previously underwent neoadjuvant therapy and biopsy of mass.    PT Comments    The pt was able to make great progress with mobility progression and activity tolerance this session. He completed 220ft hallway ambulation with use of a rollator with HR 120-130 bpm. The pt remains limited by pain and fatigue at this time, but is eager to progress and hopeful for eventual return home as medically appropriate. Will continue to benefit from skilled PT to progress power, stability, and independence.    Recommendations for follow up therapy are one component of a multi-disciplinary discharge planning process, led by the attending physician.  Recommendations may be updated based on patient status, additional functional criteria and insurance authorization.  Follow Up Recommendations  No PT follow up     Assistance Recommended at Discharge Intermittent Supervision/Assistance  Equipment Recommendations  Rollator (4 wheels);BSC/3in1    Recommendations for Other Services       Precautions / Restrictions Precautions Precautions: Fall Precaution Comments: R chest tube, JPP drain, watch HR Restrictions Weight Bearing Restrictions: No     Mobility  Bed Mobility Overal bed mobility: Needs Assistance Bed Mobility: Supine to Sit     Supine to sit: Supervision     General bed mobility comments: no assist given, increased time and line management    Transfers Overall transfer level: Needs assistance Equipment used: None;Rollator (4 wheels) Transfers: Sit to/from Stand Sit to Stand: Min guard           General transfer comment: pt standing without assist, able to  complete with UE support on seat to power up. completed from EOB x3, recliner x1, and low toilet x1    Ambulation/Gait Ambulation/Gait assistance: Min guard Gait Distance (Feet): 220 Feet Assistive device: Rollator (4 wheels) Gait Pattern/deviations: Step-through pattern;Decreased stride length;Trunk flexed Gait velocity: decreased Gait velocity interpretation: <1.31 ft/sec, indicative of household ambulator   General Gait Details: pt with slight trunk flexion, but able to complete good bout of hallway ambulation without physical assist to steady wtih BUE support. HR 120-130 with gait      Balance Overall balance assessment: Mild deficits observed, not formally tested                                          Cognition Arousal/Alertness: Awake/alert Behavior During Therapy: WFL for tasks assessed/performed Overall Cognitive Status: Within Functional Limits for tasks assessed                                 General Comments: pt following all cues, able to answer questions appropriately Functional Status Assessment: Patient has had a recent decline in their functional status and demonstrates the ability to make significant improvements in function in a reasonable and predictable amount of time.      Exercises      General Comments General comments (skin integrity, edema, etc.): HR to 157bpm while toileting, 120-130 with gait      Pertinent Vitals/Pain Pain Assessment: 0-10 Pain Score: 4  Pain Location: pain at chest tube  insertion site Pain Descriptors / Indicators: Discomfort;Sore Pain Intervention(s): Limited activity within patient's tolerance;Monitored during session;Repositioned     PT Goals (current goals can now be found in the care plan section) Acute Rehab PT Goals Patient Stated Goal: return to playing golf and riding motorcycles PT Goal Formulation: With patient Time For Goal Achievement: 09/22/21 Potential to Achieve Goals:  Good Progress towards PT goals: Progressing toward goals    Frequency    Min 3X/week      PT Plan Current plan remains appropriate       AM-PAC PT "6 Clicks" Mobility   Outcome Measure  Help needed turning from your back to your side while in a flat bed without using bedrails?: A Little Help needed moving from lying on your back to sitting on the side of a flat bed without using bedrails?: A Little Help needed moving to and from a bed to a chair (including a wheelchair)?: A Little Help needed standing up from a chair using your arms (e.g., wheelchair or bedside chair)?: A Little Help needed to walk in hospital room?: A Little Help needed climbing 3-5 steps with a railing? : A Lot 6 Click Score: 17    End of Session   Activity Tolerance: Patient tolerated treatment well Patient left: in chair;with call bell/phone within reach Nurse Communication: Mobility status PT Visit Diagnosis: Other abnormalities of gait and mobility (R26.89);Pain;Muscle weakness (generalized) (M62.81);Unsteadiness on feet (R26.81) Pain - Right/Left: Right     Time: 4403-4742 PT Time Calculation (min) (ACUTE ONLY): 33 min  Charges:  $Gait Training: 23-37 mins                     West Carbo, PT, DPT   Acute Rehabilitation Department Pager #: [phone]   Sandra Cockayne 09/09/2021, 1:07 PM

## 2021-09-09 NOTE — Evaluation (Signed)
Clinical/Bedside Swallow Evaluation Patient Details  Name: ANTHONIE LOTITO MRN: [id number] Date of Birth: Aug 28, 1956  Today's Date: 09/09/2021 Time: SLP Start Time (ACUTE ONLY): 3435 SLP Stop Time (ACUTE ONLY): 6861 SLP Time Calculation (min) (ACUTE ONLY): 30 min  Past Medical History:  Past Medical History:  Diagnosis Date   Arthritis    BPH (benign prostatic hyperplasia)    Dyslipidemia    Elevated PSA    Esophageal cancer ([place])    Full dentures    GERD (gastroesophageal reflux disease)    History of melanoma excision    07/ 2011  s/p  wide excision left back--- per pathology-- negative maligant --  mild melanocytic atypia   History of palpitations    cardiologist --  dr Shelva Majestic (last note in epic 2014   Hypertension    Sleep apnea    Past Surgical History:  Past Surgical History:  Procedure Laterality Date   COLONOSCOPY  10/24/2007   ESOPHAGOGASTRODUODENOSCOPY N/A 09/05/2021   Procedure: ESOPHAGOGASTRODUODENOSCOPY (EGD);  Surgeon: Lajuana Matte, MD;  Location: [facility] OR;  Service: Thoracic;  Laterality: N/A;   ESOPHAGOGASTRODUODENOSCOPY (EGD) WITH PROPOFOL N/A 05/11/2021   Procedure: ESOPHAGOGASTRODUODENOSCOPY (EGD) WITH PROPOFOL;  Surgeon: Arta Silence, MD;  Location: [facility] ENDOSCOPY;  Service: Endoscopy;  Laterality: N/A;   EUS N/A 05/11/2021   Procedure: ESOPHAGEAL ENDOSCOPIC ULTRASOUND (EUS) RADIAL;  Surgeon: Arta Silence, MD;  Location: [facility];  Service: Endoscopy;  Laterality: N/A;   EYE SURGERY     Lasik surgery on both eyes, but still needs glasses   FINE NEEDLE ASPIRATION  05/26/2021   Procedure: FINE NEEDLE ASPIRATION (FNA) LINEAR;  Surgeon: Garner Nash, DO;  Location: [facility];  Service: Pulmonary;;   INTERCOSTAL NERVE BLOCK  09/05/2021   Procedure: INTERCOSTAL NERVE BLOCK;  Surgeon: Lajuana Matte, MD;  Location: [facility];  Service: Thoracic;;   IR IMAGING GUIDED PORT INSERTION  06/30/2021   JEJUNOSTOMY  09/05/2021   Procedure:  Shanon Rosser;  Surgeon: Lajuana Matte, MD;  Location: [facility];  Service: Thoracic;;   PROSTATE BIOPSY N/A 07/02/2015   Procedure: BIOPSY TRANSRECTAL ULTRASONIC PROSTATE (TUBP);  Surgeon: Lowella Bandy, MD;  Location: [facility];  Service: Urology;  Laterality: N/A;   PROSTATE BIOPSY N/A 11/22/2015   Procedure: BIOPSY TRANSRECTAL ULTRASONIC PROSTATE (TUBP);  Surgeon: Cleon Gustin, MD;  Location: [facility];  Service: Urology;  Laterality: N/A;   TRANSTHORACIC ECHOCARDIOGRAM  11/19/2012   normal echo/  trivial TR   VIDEO BRONCHOSCOPY WITH ENDOBRONCHIAL ULTRASOUND N/A 05/26/2021   Procedure: VIDEO BRONCHOSCOPY WITH ENDOBRONCHIAL ULTRASOUND;  Surgeon: Garner Nash, DO;  Location: [facility];  Service: Pulmonary;  Laterality: N/A;   WIDE EXCISION MELANOMA LEFT BACK  05/02/2010   HPI:  JO CERONE 65 y.o. male with history of poorly differentiated adenocarcinoma the distal esophagus s/p neoadjuvant therapy, and esophagectomy 11/14 with J tube placement. Esophagram 11/18 with no leak and no aspiration noted.  Pt with prior hx BPH, Dyslipidemia, Elevated PSA, GERD, History of melanoma excision 07/ 2011.    Assessment / Plan / Recommendation  Clinical Impression  Pt seen for clinical swallow evaluation in setting of recent esophagectomy 11/14.  Esophagram morning of 11/18 with no aspiration.  Pt exhibited immediate cough with initial trials of water.  Pt observed with puree and thin liquid consistencies over course of 30 minutes with decrease in coughing to intermittent in presence and absence of POs.  Pt denied feeling of food and drink going down  the wrong way.  Denies feeling of stasis of bolus.  Pt states that he has a long of phlegm and when he does cough something up it is thick.  He does not feel that he is having any difficulty swallowing and esophagram supports same.  Reviewed esophageal precautions with pt and wife verbally and provided written handout.   Pt declined solid trials this date.  He did not want to place dentures and would prefer soft foods.  Pt c/o odynophagia related to thrush, which was diagnosed ~12 weeks ago with treatment initiating in the past day or two and from difficult NG placement.    Recommend puree diet with thin liquid.  SLP to follow for diet tolerance and advancement.  SLP Visit Diagnosis: Dysphagia, unspecified (R13.10)    Aspiration Risk  Mild aspiration risk    Diet Recommendation Dysphagia 1 (Puree);Thin liquid   Liquid Administration via: Cup;Straw Medication Administration: Via alternative means Supervision: Patient able to self feed Compensations: Slow rate;Small sips/bites (nothing to eat or drink 2-3 hours before bed; esophageal precuations) Postural Changes: Seated upright at 90 degrees;Remain upright for at least 30 minutes after po intake    Other  Recommendations Oral Care Recommendations: Oral care BID    Recommendations for follow up therapy are one component of a multi-disciplinary discharge planning process, led by the attending physician.  Recommendations may be updated based on patient status, additional functional criteria and insurance authorization.  Follow up Recommendations  (TBD)      Assistance Recommended at Discharge PRN  Functional Status Assessment Patient has had a recent decline in their functional status and demonstrates the ability to make significant improvements in function in a reasonable and predictable amount of time.  Frequency and Duration min 2x/week  2 weeks       Prognosis Prognosis for Safe Diet Advancement: Good      Swallow Study   General Date of Onset: 09/09/21 HPI: FILIMON MIRANDA 65 y.o. male with history of poorly differentiated adenocarcinoma the distal esophagus s/p neoadjuvant therapy, and esophagectomy 11/14 with J tube placement. Esophagram 11/18 with no leak and no aspiration noted.  Pt with prior hx BPH, Dyslipidemia, Elevated PSA, GERD,  History of melanoma excision 07/ 2011. Type of Study: Bedside Swallow Evaluation Previous Swallow Assessment: Esophagram 11/18 with no aspiration noted. Diet Prior to this Study: Dysphagia 1 (puree);Thin liquids Temperature Spikes Noted: No Respiratory Status: Room air History of Recent Intubation: Yes Length of Intubations (days): 0 days (for procedure) Date extubated: 09/05/21 Behavior/Cognition: Alert;Cooperative;Pleasant mood Oral Cavity Assessment: Within Functional Limits Oral Care Completed by SLP: No Oral Cavity - Dentition: Edentulous (pt has dentures present, declined placement today) Vision: Functional for self-feeding Self-Feeding Abilities: Able to feed self Patient Positioning: Upright in chair Baseline Vocal Quality: Normal Volitional Cough: Strong Volitional Swallow: Able to elicit    Oral/Motor/Sensory Function Overall Oral Motor/Sensory Function: Within functional limits Facial ROM: Within Functional Limits Facial Symmetry: Within Functional Limits Lingual ROM: Within Functional Limits Lingual Symmetry: Within Functional Limits Lingual Strength: Within Functional Limits Velum: Within Functional Limits Mandible: Within Functional Limits   Ice Chips Ice chips: Not tested   Thin Liquid Thin Liquid: Impaired Pharyngeal  Phase Impairments: Cough - Immediate;Throat Clearing - Immediate;Throat Clearing - Delayed    Nectar Thick Nectar Thick Liquid: Not tested   Honey Thick Honey Thick Liquid: Not tested   Puree Puree: Impaired Pharyngeal Phase Impairments: Throat Clearing - Delayed   Solid     Solid: Not tested  Celedonio Savage, MA, [place] Office: [phone]  09/09/2021,1:05 PM

## 2021-09-10 ENCOUNTER — Inpatient Hospital Stay (HOSPITAL_COMMUNITY): Payer: BC Managed Care – PPO

## 2021-09-10 LAB — CBC
HCT: 22.6 % — ABNORMAL LOW (ref 39.0–52.0)
Hemoglobin: 7.8 g/dL — ABNORMAL LOW (ref 13.0–17.0)
MCH: 35.5 pg — ABNORMAL HIGH (ref 26.0–34.0)
MCHC: 34.5 g/dL (ref 30.0–36.0)
MCV: 102.7 fL — ABNORMAL HIGH (ref 80.0–100.0)
Platelets: 112 10*3/uL — ABNORMAL LOW (ref 150–400)
RBC: 2.2 MIL/uL — ABNORMAL LOW (ref 4.22–5.81)
RDW: 12.5 % (ref 11.5–15.5)
WBC: 3.9 10*3/uL — ABNORMAL LOW (ref 4.0–10.5)
nRBC: 0 % (ref 0.0–0.2)

## 2021-09-10 LAB — BASIC METABOLIC PANEL
Anion gap: 7 (ref 5–15)
BUN: 11 mg/dL (ref 8–23)
CO2: 23 mmol/L (ref 22–32)
Calcium: 7.9 mg/dL — ABNORMAL LOW (ref 8.9–10.3)
Chloride: 106 mmol/L (ref 98–111)
Creatinine, Ser: 0.56 mg/dL — ABNORMAL LOW (ref 0.61–1.24)
GFR, Estimated: >60 mL/min (ref >60)
Glucose, Bld: 111 mg/dL — ABNORMAL HIGH (ref 70–99)
Potassium: 4.4 mmol/L (ref 3.5–5.1)
Sodium: 136 mmol/L (ref 135–145)

## 2021-09-10 LAB — GLUCOSE, CAPILLARY
Glucose-Capillary: 126 mg/dL — ABNORMAL HIGH (ref 70–99)
Glucose-Capillary: 110 mg/dL — ABNORMAL HIGH (ref 70–99)
Glucose-Capillary: 112 mg/dL — ABNORMAL HIGH (ref 70–99)
Glucose-Capillary: 94 mg/dL (ref 70–99)
Glucose-Capillary: 75 mg/dL (ref 70–99)
Glucose-Capillary: 110 mg/dL — ABNORMAL HIGH (ref 70–99)

## 2021-09-10 MED ORDER — OSMOLITE 1.5 CAL PO LIQD
1000.0000 mL | ORAL | Status: DC
  Administered 2021-09-10 – 2021-09-11 (×2): 1000 mL
  Filled 2021-09-10: qty 1000

## 2021-09-10 NOTE — Progress Notes (Signed)
Speech Language Pathology Treatment: Dysphagia  Patient Details Name: Nathaniel Jennings MRN: [id number] DOB: Feb 22, 1956 Today's Date: 09/10/2021 Time: 1205-1219 SLP Time Calculation (min) (ACUTE ONLY): 14 min  Assessment / Plan / Recommendation Clinical Impression  Patient seen for f/u after initial evaluation 11/18. Patient upright in chair, reports no difficulty swallowing other than feeling of mild irritation to left throat, question post op swelling? Overall, swallowing function intact with ability to consume thin liquids, pureed solids, and regular textures solids with appropriate oral phase, timely appearing swallow initiation and initially no evidence of aspiration. Mild throat clearing noted at the very end of the session following intake of crackers and sips of thin liquid. Difficult to determine exact origin as patient does have a h/o GERD in addition to radiation treatment in July 2022 which can impact muscle function and recent surgery, which can be contributing to mild dysphagia. Regardless, throat clear/cough at this time appearing productive and likely clearing airway of any material that may be being penetrated or even aspirated post swallow. Recommend advancing diet to regular as appropriate per primary surgeon. SLP will f/u briefly for tolerance once advanced.    HPI HPI: Nathaniel Jennings 65 y.o. male with history of poorly differentiated adenocarcinoma the distal esophagus s/p neoadjuvant therapy, and esophagectomy 11/14 with J tube placement. Esophagram 11/18 with no leak and no aspiration noted.  Pt with prior hx BPH, Dyslipidemia, Elevated PSA, GERD, History of melanoma excision 07/ 2011.      SLP Plan  Continue with current plan of care      Recommendations for follow up therapy are one component of a multi-disciplinary discharge planning process, led by the attending physician.  Recommendations may be updated based on patient status, additional functional criteria and insurance  authorization.    Recommendations  Diet recommendations: Regular;Thin liquid Liquids provided via: Cup;Straw Medication Administration: Whole meds with liquid Supervision: Patient able to self feed Compensations: Slow rate;Small sips/bites Postural Changes and/or Swallow Maneuvers: Seated upright 90 degrees;Upright 30-60 min after meal                Oral Care Recommendations: Oral care BID Follow Up Recommendations: No SLP follow up SLP Visit Diagnosis: Dysphagia, unspecified (R13.10) Plan: Continue with current plan of care       GO             Temeca Somma MA, CCC-SLP    Christinea Brizuela Meryl  09/10/2021, 12:20 PM

## 2021-09-10 NOTE — Progress Notes (Signed)
Physical Therapy Treatment Patient Details Name: Nathaniel Jennings MRN: [id number] DOB: [birth year] Today's Date: 09/10/2021   History of Present Illness The pt is a 65 yo male presenting 11/14 for esophagectomy with J-tube placement and EGD due to adenocarcinoma of the distal esophagus. PMH includes: GERD, HTN, OSA, and has previously underwent neoadjuvant therapy and biopsy of mass.    PT Comments    Pt pleasant with pain 4/10 at rest and 7/10 with gait with rise in HR to 140 with elevated pain. PT able to walk in hall without AD or LOB today and complete stairs. Pt educated for HEP and continued progression toward PLOF.     Recommendations for follow up therapy are one component of a multi-disciplinary discharge planning process, led by the attending physician.  Recommendations may be updated based on patient status, additional functional criteria and insurance authorization.  Follow Up Recommendations  No PT follow up     Assistance Recommended at Discharge    Equipment Recommendations  None recommended by PT    Recommendations for Other Services       Precautions / Restrictions Precautions Precautions: Fall Precaution Comments: Rt Jp drain, watch HR Restrictions Weight Bearing Restrictions: No     Mobility  Bed Mobility Overal bed mobility: Modified Independent                  Transfers Overall transfer level: Modified independent                      Ambulation/Gait Ambulation/Gait assistance: Min guard Gait Distance (Feet): 250 Feet Assistive device: None Gait Pattern/deviations: Step-through pattern;Decreased stride length   Gait velocity interpretation: >2.62 ft/sec, indicative of community ambulatory   General Gait Details: pt with steady gait without use of RW with elevated HR to 140 with gait reporting increased pain to 7/10 during mobility   Stairs Stairs: Yes Stairs assistance: Modified independent (Device/Increase time) Stair  Management: One rail Right;Alternating pattern;Forwards Number of Stairs: 3 General stair comments: pt with good stability with use of rail   Wheelchair Mobility    Modified Rankin (Stroke Patients Only)       Balance Overall balance assessment: No apparent balance deficits (not formally assessed)                                          Cognition Arousal/Alertness: Awake/alert Behavior During Therapy: WFL for tasks assessed/performed Overall Cognitive Status: Within Functional Limits for tasks assessed                                          Exercises General Exercises - Lower Extremity Long Arc Quad: AROM;Both;Seated;20 reps Hip Flexion/Marching: AROM;Both;Seated;20 reps    General Comments        Pertinent Vitals/Pain Pain Assessment: No/denies pain Pain Score: 7  Breathing: normal Negative Vocalization: none Facial Expression: smiling or inexpressive Body Language: relaxed Consolability: no need to console PAINAD Score: 0 Pain Location: pain at JP drain Pain Descriptors / Indicators: Discomfort;Sore Pain Intervention(s): Limited activity within patient's tolerance;Monitored during session    Home Living                          Prior Function  PT Goals (current goals can now be found in the care plan section) Progress towards PT goals: Progressing toward goals    Frequency    Min 3X/week      PT Plan Current plan remains appropriate    Co-evaluation              AM-PAC PT "6 Clicks" Mobility   Outcome Measure  Help needed turning from your back to your side while in a flat bed without using bedrails?: None Help needed moving from lying on your back to sitting on the side of a flat bed without using bedrails?: None Help needed moving to and from a bed to a chair (including a wheelchair)?: None Help needed standing up from a chair using your arms (e.g., wheelchair or bedside  chair)?: A Little Help needed to walk in hospital room?: A Little Help needed climbing 3-5 steps with a railing? : None 6 Click Score: 22    End of Session   Activity Tolerance: Patient tolerated treatment well Patient left: in chair;with call bell/phone within reach Nurse Communication: Mobility status PT Visit Diagnosis: Other abnormalities of gait and mobility (R26.89);Pain     Time: 2836-6294 PT Time Calculation (min) (ACUTE ONLY): 20 min  Charges:  $Gait Training: 8-22 mins                    Bayard Males, PT Acute Rehabilitation Services Pager: [phone] Office: [phone]    Sandy Salaam Savahanna Almendariz 09/10/2021, 12:24 PM

## 2021-09-10 NOTE — Progress Notes (Addendum)
      [address]       ,[place] [address]             [phone]      5 Days Post-Op Procedure(s) (LRB): XI ROBOTIC ASSISTED IVOR LEWIS ESOPHAGECTOMY (N/A) ESOPHAGOGASTRODUODENOSCOPY (EGD) (N/A) INTERCOSTAL NERVE BLOCK JEJUNOSTOMY  Subjective:  Patient had a difficult night.  He states the tube feeds are running too fast and filled his stomach up too much.  He has been unable to eat breakfast because he is just too full.  He also developed diarrhea.  Objective: Vital signs in last 24 hours: Temp:  [97.9 F (36.6 C)-100.8 F (38.2 C)] 99.7 F (37.6 C) (11/19 0756) Pulse Rate:  [100-109] 109 (11/19 0756) Cardiac Rhythm: Sinus tachycardia (11/19 0700) Resp:  [18-20] 19 (11/19 0756) BP: (101-129)/(59-80) 127/80 (11/19 0756) SpO2:  [93 %-98 %] 98 % (11/19 0756) Weight:  [68.9 kg] 68.9 kg (11/19 0623)  Intake/Output from previous day: 11/18 0701 - 11/19 0700 In: 240 [P.O.:240] Out: 1190 [Urine:900; Drains:170; Chest Tube:120] Intake/Output this shift: Total I/O In: -  Out: 395 [Urine:325; Drains:70]  General appearance: alert, cooperative, and no distress Heart: RRR, tachy Lungs: clear to auscultation bilaterally Abdomen: soft, + distention, + BS Extremities: extremities normal, atraumatic, no cyanosis or edema Wound: clean and dry  Lab Results: Recent Labs    09/08/21 0644 09/10/21 0500  WBC 4.4 3.9*  HGB 8.0* 7.8*  HCT 24.3* 22.6*  PLT 90* 112*   BMET:  Recent Labs    09/09/21 0444 09/10/21 0500  NA 140 136  K 3.8 4.4  CL 110 106  CO2 26 23  GLUCOSE 110* 111*  BUN 11 11  CREATININE 0.56* 0.56*  CALCIUM 8.0* 7.9*    PT/INR: No results for input(s): LABPROT, INR in the last 72 hours. ABG    Component Value Date/Time   PHART 7.383 09/05/2021 1746   HCO3 26.4 09/05/2021 1746   TCO2 28 09/05/2021 1746   ACIDBASEDEF 0.1 09/02/2021 0923   O2SAT 97.0 09/05/2021 1746   CBG (last 3)  Recent Labs    09/10/21 0306 09/10/21 0458  09/10/21 0753  GLUCAP 126* 110* 112*    Assessment/Plan: S/P Procedure(s) (LRB): XI ROBOTIC ASSISTED IVOR LEWIS ESOPHAGECTOMY (N/A) ESOPHAGOGASTRODUODENOSCOPY (EGD) (N/A) INTERCOSTAL NERVE BLOCK JEJUNOSTOMY  CV- Sinus Tachycardia, likely due to mild dehydration, discomfort from tube feedings, monitor Pulm- CT is out, CXR w/o effusion/pneumothorax, continue IS... JP drain is 170 cc output leave in place Renal- creatinine, lytes okay GI- tube feedings running from 6pm to 10 AM, this has made patient too full to eat.. will scale back rate and only run from 7P to 5A to allow patient the ability to increase oral intake Dispo- patient with tachycardia monitor, JP output is at 170, continue to encourage oral intake as able.. decreasing tube feeds should help with this, DME/Home health orders placed   LOS: 5 days    Ellwood Handler, PA-C 09/10/2021  Patient seen and examined, agree with above Looks good this morning Events of last night noted Adjust tube feedings Dc planning  Remo Lipps C. Roxan Hockey, MD [facility] [phone]

## 2021-09-10 NOTE — Plan of Care (Signed)
  Problem: Education: Goal: Knowledge of the prescribed therapeutic regimen will improve Outcome: Progressing   Problem: Nutritional: Goal: Ability to achieve adequate nutritional intake will improve Outcome: Progressing   Problem: Clinical Measurements: Goal: Postoperative complications will be avoided or minimized Outcome: Progressing   Problem: Respiratory: Goal: Ability to maintain a clear airway will improve Outcome: Progressing   Problem: Skin Integrity: Goal: Demonstration of wound healing without infection will improve Outcome: Progressing   Problem: Education: Goal: Knowledge of General Education information will improve Description: Including pain rating scale, medication(s)/side effects and non-pharmacologic comfort measures Outcome: Progressing   Problem: Health Behavior/Discharge Planning: Goal: Ability to manage health-related needs will improve Outcome: Progressing   Problem: Clinical Measurements: Goal: Ability to maintain clinical measurements within normal limits will improve Outcome: Progressing Goal: Will remain free from infection Outcome: Progressing Goal: Diagnostic test results will improve Outcome: Progressing Goal: Respiratory complications will improve Outcome: Progressing Goal: Cardiovascular complication will be avoided Outcome: Progressing   Problem: Activity: Goal: Risk for activity intolerance will decrease Outcome: Progressing   Problem: Nutrition: Goal: Adequate nutrition will be maintained Outcome: Progressing   Problem: Coping: Goal: Level of anxiety will decrease Outcome: Progressing   Problem: Elimination: Goal: Will not experience complications related to urinary retention Outcome: Progressing   Problem: Pain Managment: Goal: General experience of comfort will improve Outcome: Progressing   Problem: Safety: Goal: Ability to remain free from injury will improve Outcome: Progressing   Problem: Skin Integrity: Goal:  Risk for impaired skin integrity will decrease Outcome: Progressing   Problem: Bowel/Gastric: Goal: Gastrointestinal status for postoperative course will improve Outcome: Not Progressing   Problem: Elimination: Goal: Will not experience complications related to bowel motility Outcome: Not Progressing

## 2021-09-11 LAB — BASIC METABOLIC PANEL
Anion gap: 5 (ref 5–15)
BUN: 8 mg/dL (ref 8–23)
CO2: 24 mmol/L (ref 22–32)
Calcium: 8 mg/dL — ABNORMAL LOW (ref 8.9–10.3)
Chloride: 104 mmol/L (ref 98–111)
Creatinine, Ser: 0.62 mg/dL (ref 0.61–1.24)
GFR, Estimated: >60 mL/min (ref >60)
Glucose, Bld: 134 mg/dL — ABNORMAL HIGH (ref 70–99)
Potassium: 3.8 mmol/L (ref 3.5–5.1)
Sodium: 133 mmol/L — ABNORMAL LOW (ref 135–145)

## 2021-09-11 LAB — GLUCOSE, CAPILLARY
Glucose-Capillary: 103 mg/dL — ABNORMAL HIGH (ref 70–99)
Glucose-Capillary: 114 mg/dL — ABNORMAL HIGH (ref 70–99)
Glucose-Capillary: 119 mg/dL — ABNORMAL HIGH (ref 70–99)
Glucose-Capillary: 120 mg/dL — ABNORMAL HIGH (ref 70–99)
Glucose-Capillary: 135 mg/dL — ABNORMAL HIGH (ref 70–99)
Glucose-Capillary: 73 mg/dL (ref 70–99)
Glucose-Capillary: 92 mg/dL (ref 70–99)

## 2021-09-11 MED ORDER — OSMOLITE 1.5 CAL PO LIQD: 1000.0000 mL | ORAL | 0 refills | Status: DC

## 2021-09-11 MED ORDER — PHENOL 1.4 % MT LIQD: 1.0000 | OROMUCOSAL | 0 refills | Status: DC | PRN

## 2021-09-11 MED ORDER — METOPROLOL SUCCINATE ER 25 MG PO TB24
25.0000 mg | ORAL_TABLET | Freq: Every day | ORAL | Status: DC
  Administered 2021-09-11 – 2021-09-12 (×2): 25 mg via ORAL
  Filled 2021-09-11 (×2): qty 1

## 2021-09-11 MED ORDER — MENTHOL 3 MG MT LOZG
1.0000 | LOZENGE | OROMUCOSAL | 12 refills | Status: DC | PRN
Start: 2021-09-11 — End: 2021-09-26

## 2021-09-11 NOTE — Plan of Care (Signed)
  Problem: Education: Goal: Knowledge of the prescribed therapeutic regimen will improve Outcome: Progressing   Problem: Bowel/Gastric: Goal: Gastrointestinal status for postoperative course will improve Outcome: Progressing   Problem: Nutritional: Goal: Ability to achieve adequate nutritional intake will improve Outcome: Progressing   Problem: Clinical Measurements: Goal: Postoperative complications will be avoided or minimized Outcome: Progressing   Problem: Respiratory: Goal: Ability to maintain a clear airway will improve Outcome: Progressing   Problem: Skin Integrity: Goal: Demonstration of wound healing without infection will improve Outcome: Progressing   Problem: Education: Goal: Knowledge of General Education information will improve Description: Including pain rating scale, medication(s)/side effects and non-pharmacologic comfort measures Outcome: Progressing   Problem: Health Behavior/Discharge Planning: Goal: Ability to manage health-related needs will improve Outcome: Progressing   Problem: Clinical Measurements: Goal: Ability to maintain clinical measurements within normal limits will improve Outcome: Progressing Goal: Will remain free from infection Outcome: Progressing Goal: Diagnostic test results will improve Outcome: Progressing Goal: Respiratory complications will improve Outcome: Progressing Goal: Cardiovascular complication will be avoided Outcome: Progressing   Problem: Activity: Goal: Risk for activity intolerance will decrease Outcome: Progressing   Problem: Nutrition: Goal: Adequate nutrition will be maintained Outcome: Progressing   Problem: Coping: Goal: Level of anxiety will decrease Outcome: Progressing   Problem: Elimination: Goal: Will not experience complications related to bowel motility Outcome: Progressing Goal: Will not experience complications related to urinary retention Outcome: Progressing   Problem: Pain  Managment: Goal: General experience of comfort will improve Outcome: Progressing   Problem: Safety: Goal: Ability to remain free from injury will improve Outcome: Progressing   Problem: Skin Integrity: Goal: Risk for impaired skin integrity will decrease Outcome: Progressing   

## 2021-09-11 NOTE — Progress Notes (Signed)
      [address]       [place] [address]             [phone]      6 Days Post-Op Procedure(s) (LRB): XI ROBOTIC ASSISTED IVOR LEWIS ESOPHAGECTOMY (N/A) ESOPHAGOGASTRODUODENOSCOPY (EGD) (N/A) INTERCOSTAL NERVE BLOCK JEJUNOSTOMY  Subjective:  Patient feeling better this morning.  Tolerating current diet w/o difficulty.  He is requesting a diet advancement so he can have something other than straight pureed food.  Patient educated on importance of not eating large portions of feet and not soft food.  Objective: Vital signs in last 24 hours: Temp:  [98.7 F (37.1 C)-99.8 F (37.7 C)] 98.7 F (37.1 C) (11/20 0425) Pulse Rate:  [99-110] 101 (11/20 0425) Cardiac Rhythm: Sinus tachycardia (11/20 0700) Resp:  [19-20] 20 (11/20 0425) BP: (111-134)/(73-82) 120/73 (11/20 0425) SpO2:  [90 %-98 %] 93 % (11/20 0425) Weight:  [68 kg] 68 kg (11/20 0429)  Intake/Output from previous day: 11/19 0701 - 11/20 0700 In: 8318.4 [P.O.:720; I.V.:4837.4; NG/GT:2761.1] Out: 3785 [Urine:3525; Drains:260] Intake/Output this shift: Total I/O In: -  Out: 335 [Urine:275; Drains:60]  General appearance: alert, cooperative, and no distress Heart: regular rate and rhythm Lungs: clear to auscultation bilaterally Abdomen: soft, non-tender; bowel sounds normal; no masses,  no organomegaly Extremities: extremities normal, atraumatic, no cyanosis or edema Wound: clean and dry  Lab Results: Recent Labs    09/10/21 0500  WBC 3.9*  HGB 7.8*  HCT 22.6*  PLT 112*   BMET:  Recent Labs    09/10/21 0500 09/11/21 0500  NA 136 133*  K 4.4 3.8  CL 106 104  CO2 23 24  GLUCOSE 111* 134*  BUN 11 8  CREATININE 0.56* 0.62  CALCIUM 7.9* 8.0*    PT/INR: No results for input(s): LABPROT, INR in the last 72 hours. ABG    Component Value Date/Time   PHART 7.383 09/05/2021 1746   HCO3 26.4 09/05/2021 1746   TCO2 28 09/05/2021 1746   ACIDBASEDEF 0.1 09/02/2021 0923   O2SAT 97.0  09/05/2021 1746   CBG (last 3)  Recent Labs    09/10/21 1958 09/11/21 0001 09/11/21 0424  GLUCAP 110* 114* 119*    Assessment/Plan: S/P Procedure(s) (LRB): XI ROBOTIC ASSISTED IVOR LEWIS ESOPHAGECTOMY (N/A) ESOPHAGOGASTRODUODENOSCOPY (EGD) (N/A) INTERCOSTAL NERVE BLOCK JEJUNOSTOMY  CV- remains tachycardic- on Toprol XL prior to admission will start at reduced dose Pulm- no acute issues, continue IS Renal- creatinine, lytes okay, mild hyponatremia at 133 this morning,  GI- tolerating reduced tube feedings, will advance diet to dysphagia 3, patient educated on importance of not eating large amounts of food  Dispo- patient stable advance diet today, restart home Toprol XL, watch sodium level...[name] patient hoping to go home tomorrow    LOS: 6 days    Ellwood Handler, PA-C  09/11/2021

## 2021-09-11 NOTE — TOC Initial Note (Signed)
Transition of Care [facility]) - Initial/Assessment Note    Patient Details  Name: Nathaniel Jennings MRN: [id number] Date of Birth: [birth year]  Transition [facility]) CM/SW Contact:    Nathaniel Collet, RN Phone Number: 09/11/2021, 3:15 PM  Clinical Narrative:      Nathaniel Jennings w patient and his Nathaniel Jennings [phone] at bedside. Patent endorses that he lives at home alone, but his friend Nathaniel Jennings will be staying with him when he gets home, she agrees, "For as long as he needs." Patient states that plan as explained to him by Dr Kipp Brood is to remove the feeding tube prior to DC and then follow up on Dec 2nd at his office and likely remove the JP drain. They state that he is only getting feeds overnight and he ate well today as his appetite has returned since feeds were decreased.  Instructed patient and Nathaniel Jennings to learn from the nurses how to care for the JP drain, patient can't see site but Nathaniel Jennings consents that she will be taking care of it at home.  They state that he has been up and ambulating independently and do not feel he needs [facility] RN or therapies. Nathaniel Jennings states that she has been doing the PT exercises with him in the room.              Expected Discharge Plan: Home/Self Care Barriers to Discharge: Continued Medical Work up   Patient Goals and CMS Choice Patient states their goals for this hospitalization and ongoing recovery are:: to go home      Expected Discharge Plan and Services Expected Discharge Plan: Home/Self Care   Discharge Planning Services: CM Consult Post Acute Care Choice: NA Living arrangements for the past 2 months: Single Family Home                 DME Arranged: N/A         HH Arranged: Refused HH          Prior Living Arrangements/Services Living arrangements for the past 2 months: Single Family Home Lives with:: Self   Do you feel safe going back to the place where you live?: Yes               Activities of Daily Living Home Assistive  Devices/Equipment: Eyeglasses, Dentures (specify type), Blood pressure cuff ADL Screening (condition at time of admission) Patient's cognitive ability adequate to safely complete daily activities?: Yes Is the patient deaf or have difficulty hearing?: No Does the patient have difficulty seeing, even when wearing glasses/contacts?: No Does the patient have difficulty concentrating, remembering, or making decisions?: No Patient able to express need for assistance with ADLs?: Yes Does the patient have difficulty dressing or bathing?: No Independently performs ADLs?: Yes (appropriate for developmental age) Does the patient have difficulty walking or climbing stairs?: No Weakness of Legs: None Weakness of Arms/Hands: None  Permission Sought/Granted                  Emotional Assessment              Admission diagnosis:  Hx of esophagectomy [J28.786, Z90.49] Patient Active Problem List   Diagnosis Date Noted   Malnutrition of moderate degree 09/09/2021   Hx of esophagectomy 09/05/2021   Port-A-Cath in place 07/18/2021   Adenopathy 05/23/2021   Malignant neoplasm of overlapping sites of esophagus ([place]) 05/09/2021   Essential hypertension 03/14/2013   Vertigo 03/14/2013   DYSLIPIDEMIA 11/27/2007   CHEST PAIN, ATYPICAL 11/27/2007  PCP:  Nathaniel Bender, PA-C Pharmacy:   [id number] - [place] [place] [address] Phone: [phone] Fax: [phone]     Social Determinants of Health (SDOH) Interventions    Readmission Risk Interventions No flowsheet data found.

## 2021-09-11 NOTE — Progress Notes (Signed)
Mobility Specialist: Progress Note   09/11/21 1611  Mobility  Activity Ambulated in hall  Level of Assistance Independent  Assistive Device None  Distance Ambulated (ft) 400 ft  Mobility Ambulated independently in hallway  Mobility Response Tolerated well  Mobility performed by Mobility specialist  Bed Position Chair  $Mobility charge 1 Mobility   Pre-Mobility: 107 HR During Mobility: 126 HR, 94% SpO2 Post-Mobility: 111 HR, 96% SpO2  Pt had no c/o during ambulation. Pt's HR peaked at 147 bpm during ambulation but quickly came back down with a brief standing break. Pt back to recliner after walk with call bell in his lap.   [facility] Mikaiya Tramble Mobility Specialist Mobility Specialist Phone #1: [phone] Mobility Specialist Phone #2: [phone]

## 2021-09-12 LAB — BASIC METABOLIC PANEL
Anion gap: 5 (ref 5–15)
BUN: 9 mg/dL (ref 8–23)
CO2: 25 mmol/L (ref 22–32)
Calcium: 8.1 mg/dL — ABNORMAL LOW (ref 8.9–10.3)
Chloride: 105 mmol/L (ref 98–111)
Creatinine, Ser: 0.54 mg/dL — ABNORMAL LOW (ref 0.61–1.24)
GFR, Estimated: >60 mL/min (ref >60)
Glucose, Bld: 132 mg/dL — ABNORMAL HIGH (ref 70–99)
Potassium: 3.7 mmol/L (ref 3.5–5.1)
Sodium: 135 mmol/L (ref 135–145)

## 2021-09-12 LAB — SURGICAL PATHOLOGY

## 2021-09-12 LAB — GLUCOSE, CAPILLARY
Glucose-Capillary: 111 mg/dL — ABNORMAL HIGH (ref 70–99)
Glucose-Capillary: 130 mg/dL — ABNORMAL HIGH (ref 70–99)
Glucose-Capillary: 79 mg/dL (ref 70–99)
Glucose-Capillary: 95 mg/dL (ref 70–99)
Glucose-Capillary: 97 mg/dL (ref 70–99)
Glucose-Capillary: 99 mg/dL (ref 70–99)

## 2021-09-12 MED ORDER — PROSOURCE TF PO LIQD
45.0000 mL | Freq: Three times a day (TID) | ORAL | Status: DC
  Administered 2021-09-12 (×2): 45 mL
  Filled 2021-09-12 (×3): qty 45

## 2021-09-12 MED ORDER — OSMOLITE 1.5 CAL PO LIQD
1000.0000 mL | ORAL | Status: DC
  Administered 2021-09-12: 1000 mL

## 2021-09-12 MED ORDER — OMEPRAZOLE 20 MG PO CPDR
40.0000 mg | DELAYED_RELEASE_CAPSULE | Freq: Two times a day (BID) | ORAL | Status: DC
  Administered 2021-09-12 – 2021-09-13 (×2): 40 mg via ORAL
  Filled 2021-09-12 (×3): qty 2

## 2021-09-12 NOTE — Progress Notes (Addendum)
      [address]       [place] [address]             [phone]      7 Days Post-Op Procedure(s) (LRB): XI ROBOTIC ASSISTED IVOR LEWIS ESOPHAGECTOMY (N/A) ESOPHAGOGASTRODUODENOSCOPY (EGD) (N/A) INTERCOSTAL NERVE BLOCK JEJUNOSTOMY Subjective: Some discomfort , overall feeling well  Objective: Vital signs in last 24 hours: Temp:  [98.2 F (36.8 C)-99.3 F (37.4 C)] 98.2 F (36.8 C) (11/21 1159) Pulse Rate:  [98-103] 98 (11/21 0451) Cardiac Rhythm: Sinus tachycardia (11/21 0751) Resp:  [16-20] 18 (11/21 0451) BP: (100-114)/(59-73) 109/70 (11/21 1159) SpO2:  [93 %-94 %] 94 % (11/21 0751) Weight:  [66.5 kg] 66.5 kg (11/21 0451)  Hemodynamic parameters for last 24 hours:    Intake/Output from previous day: 11/20 0701 - 11/21 0700 In: 60  Out: 2125 [Urine:1925; Drains:200] Intake/Output this shift: tachytachy  General appearance: alert, cooperative, and no distress Heart: regular rate and rhythm and tachy Lungs: dim in bases Abdomen: benign Extremities: no edema Wound: healing well  Lab Results: Recent Labs    09/10/21 0500  WBC 3.9*  HGB 7.8*  HCT 22.6*  PLT 112*   BMET:  Recent Labs    09/11/21 0500 09/12/21 0515  NA 133* 135  K 3.8 3.7  CL 104 105  CO2 24 25  GLUCOSE 134* 132*  BUN 8 9  CREATININE 0.62 0.54*  CALCIUM 8.0* 8.1*    PT/INR: No results for input(s): LABPROT, INR in the last 72 hours. ABG    Component Value Date/Time   PHART 7.383 09/05/2021 1746   HCO3 26.4 09/05/2021 1746   TCO2 28 09/05/2021 1746   ACIDBASEDEF 0.1 09/02/2021 0923   O2SAT 97.0 09/05/2021 1746   CBG (last 3)  Recent Labs    09/12/21 0450 09/12/21 0819 09/12/21 1201  GLUCAP 130* 99 95    Meds Scheduled Meds:  Chlorhexidine Gluconate Cloth  6 each Topical Daily   feeding supplement  1 Container Oral TID BM   feeding supplement (OSMOLITE 1.5 CAL)  1,000 mL Per Tube Q24H   feeding supplement (PROSource TF)  45 mL Per Tube TID    insulin aspart  0-24 Units Subcutaneous Q4H   metoprolol succinate  25 mg Oral Daily   pantoprazole (PROTONIX) IV  40 mg Intravenous Q12H   Continuous Infusions:  sodium chloride Stopped (09/12/21 1214)   PRN Meds:.magic mouthwash, menthol-cetylpyridinium, morphine injection, ondansetron (ZOFRAN) IV, phenol  Xrays No results found.  Assessment/Plan: S/P Procedure(s) (LRB): XI ROBOTIC ASSISTED IVOR LEWIS ESOPHAGECTOMY (N/A) ESOPHAGOGASTRODUODENOSCOPY (EGD) (N/A) INTERCOSTAL NERVE BLOCK JEJUNOSTOMY 1 TMAX 99.3, VSS low grade sinus tachy at times 2 sats good on RA 3 good UOP 4 tol nocturnal TF's, D3 diet, supplements 5 mobilizing well 6 per Dr Kipp Brood he will need home TF's at night , some confusion apparently whether he would, will get TOC to arrange, hopefully can be discharged tomorrow   LOS: 7 days    Nathaniel Giovanni pa-c PAGER [phone] 09/12/2021   Agree with above Tolerating diet. We will plan for discharge with nocturnal feeds.  Will remove jejunostomy tube as an outpatient if he continues to do well.  Nathaniel Jennings Nathaniel Jennings

## 2021-09-12 NOTE — Progress Notes (Signed)
Mobility Specialist Progress Note    09/12/21 1222  Mobility  Activity Ambulated in hall  Level of Assistance Independent  Assistive Device None  Distance Ambulated (ft) 410 ft  Mobility Ambulated independently in hallway  Mobility Response Tolerated well  Mobility performed by Mobility specialist  Bed Position Chair  $Mobility charge 1 Mobility   Pt received and agreeable. No complaints on walk. Returned to room with wife present and call bell in reach.   [facility] Mobility Specialist  M.S. Primary Phone: [phone] M.S. Secondary Phone: [phone]

## 2021-09-12 NOTE — TOC Progression Note (Addendum)
Transition of Care [facility]) - Progression Note    Patient Details  Name: Nathaniel Jennings MRN: [id number] Date of Birth: 05/04/1956  [facility] CM/SW Contact  Zenon Mayo, RN Phone Number: 09/12/2021, 3:34 PM  Clinical Narrative:    Patient will need to have noturnal tube feeds, NCM contacted Carolynn Sayers to follow for tube feeding teaching and supplies. NCM spoke with patient and care giver at bedside, [name] the caregiver states she has been in the medical field for years and she will be doing the tube feedings at night for patient so they will not need a HHRN she will also be doing the jp drain care.  NCM awaiting to hear from [facility] if they can take care of the supplies for patient.  Patient states he does not want a 3 n 1 or rollator.  [facility] is not able to provide the tube feeding supplies for patient. [employer] will supply the tube feeds for patient, and patient and caregiiver is ok with that.  Staff RN will send patient home with 3 days worth of tube feeds and the [employer] rep will go over with the caregiver on how to use the pump, they will deliver the rest of the tube feeds to patient 's home.    Expected Discharge Plan: Home/Self Care Barriers to Discharge: Continued Medical Work up  Expected Discharge Plan and Services Expected Discharge Plan: Home/Self Care   Discharge Planning Services: CM Consult Post Acute Care Choice: NA Living arrangements for the past 2 months: Single Family Home                 DME Arranged: N/A         HH Arranged: Refused HH           Social Determinants of Health (SDOH) Interventions    Readmission Risk Interventions No flowsheet data found.

## 2021-09-12 NOTE — Progress Notes (Signed)
Nutrition Follow-up  DOCUMENTATION CODES:   Non-severe (moderate) malnutrition in context of chronic illness  INTERVENTION:   Continue nocturnal tube feeds via J-tube per TCTS: - Osmolite 1.5 @ 65 ml/hr to run over 10 hours from 1900 to 0500 (total of 650 ml) - ProSource TF 45 ml TID  Nocturnal tube feeding regimen over 10 hours provides 1095 kcal, 74 grams of protein, and 495 ml of H2O (meets 48% of kcal needs and 74% of protein needs).  - Encourage PO intake of dysphagia 3 diet with thin liquids  - Continue Boost Breeze po TID, each supplement provides 250 kcal and 9 grams of protein  NUTRITION DIAGNOSIS:   Moderate Malnutrition related to chronic illness (esophageal cancer) as evidenced by moderate fat depletion, moderate muscle depletion, percent weight loss (13.3% weight loss in less than 4 months).  Ongoing, being addressed via tube feeds and diet advancement  GOAL:   Patient will meet greater than or equal to 90% of their needs  Progressing  MONITOR:   PO intake, Supplement acceptance, Diet advancement, Labs, Weight trends, TF tolerance, I & O's  REASON FOR ASSESSMENT:   Consult Assessment of nutrition requirement/status, Enteral/tube feeding initiation and management  ASSESSMENT:   65 yo male admitted with dx of adenocarcinoma of distal esophagus and esophagectomy with J-tube placement was performed. PMH includes tobacco and EtOH use, HTN, full dentures, GERD, dyslipidemia  11/14 - s/p Ivor Lewis esophagectomy, jejunostomy tube placement 11/15 - tube feeds initiated 11/17 - NG tube removed 11/18 - esophagram with no evidence of leak, diet advanced to dysphagia 1 with thin liquids, TF cycled over 16 hours 11/19 - TF timeframe changed to 10 hours 11/20 - diet advanced to dysphagia 3  RD working remotely. Unable to reach pt via phone call to room. RD was attempting to meet majority of pt's needs via nocturnal tube feeds but TCTS would like to decrease timeframe  and rate of tube feeds. Nocturnal tube feeds currently ordered to infuse over 10 hours at 65 ml/hr. Recommend increasing rate if PO intake is not adequate and/or pt demonstrates weight loss. At this point, pt consuming 100% of meals.  Admit weight: 65.8 kg Current weight: 66.5 kg  Meal Completion: 100% x last 4 documented meals  Medications reviewed and include: Boost Breeze TID, SSI q 4 hours, IV protonix IVF: NS @ 100 ml/hr  Labs reviewed. CBG's: 73-135 x 24 hours  UOP: 1925 ml x 24 hours R chest JP drain: 200 ml x 24 hours I/O's: +4.1 L since admit  Diet Order:   Diet Order             DIET DYS 3 Room service appropriate? Yes; Fluid consistency: Thin  Diet effective now                   EDUCATION NEEDS:   Education needs have been addressed  Skin:  Skin Assessment: Skin Integrity Issues: Incisions: chest, neck, abdomen, new J-tube  Last BM:  09/11/21  Height:   Ht Readings from Last 1 Encounters:  09/05/21 5\' 6"  (1.676 m)    Weight:   Wt Readings from Last 1 Encounters:  09/12/21 66.5 kg    BMI:  Body mass index is 23.66 kg/m.  Estimated Nutritional Needs:   Kcal:  2300-2500 kcals  Protein:  100-120 grams  Fluid:  >/= 2 L    Gustavus Bryant, MS, RD, LDN Inpatient Clinical Dietitian Please see AMiON for contact information.

## 2021-09-12 NOTE — Progress Notes (Signed)
Speech Language Pathology Treatment: Dysphagia  Patient Details Name: KYIAN OBST MRN: [id number] DOB: Nov 21, 1955 Today's Date: 09/12/2021 Time: 8006-3494 SLP Time Calculation (min) (ACUTE ONLY): 16 min  Assessment / Plan / Recommendation Clinical Impression  Pt reports some mild persisting pain left throat - otherwise, he is eating and drinking well with no overt s/s of oropharyngeal dysphagia: adequate mastication, no concerns for aspiration. Independent with PO intake. No further SLP f/u is needed. Goal met/our service will sign off.    HPI HPI: RACHAEL ZAPANTA 65 y.o. male with history of poorly differentiated adenocarcinoma the distal esophagus s/p neoadjuvant therapy, and esophagectomy 11/14 with J tube placement. Esophagram 11/18 with no leak and no aspiration noted.  Pt with prior hx BPH, Dyslipidemia, Elevated PSA, GERD, History of melanoma excision 07/ 2011.      SLP Plan  All goals met      Recommendations for follow up therapy are one component of a multi-disciplinary discharge planning process, led by the attending physician.  Recommendations may be updated based on patient status, additional functional criteria and insurance authorization.    Recommendations  Diet recommendations: Regular;Thin liquid Liquids provided via: Cup;Straw Medication Administration: Whole meds with liquid Supervision: Patient able to self feed                Oral Care Recommendations: Oral care BID Follow Up Recommendations: No SLP follow up Assistance recommended at discharge: PRN SLP Visit Diagnosis: Dysphagia, unspecified (R13.10) Plan: All goals met       GO               Aidee Latimore L. Tivis Ringer, [place] CCC/SLP Acute Rehabilitation Services Office number [phone] Pager [phone]  Assunta Curtis  09/12/2021, 3:46 PM

## 2021-09-13 LAB — GLUCOSE, CAPILLARY
Glucose-Capillary: 133 mg/dL — ABNORMAL HIGH (ref 70–99)
Glucose-Capillary: 92 mg/dL (ref 70–99)
Glucose-Capillary: 127 mg/dL — ABNORMAL HIGH (ref 70–99)

## 2021-09-13 MED ORDER — ZINC OXIDE 40 % EX OINT
TOPICAL_OINTMENT | Freq: Two times a day (BID) | CUTANEOUS | Status: DC
  Filled 2021-09-13: qty 57

## 2021-09-13 MED ORDER — NYSTATIN 100000 UNIT/ML MT SUSP: 5.0000 mL | Freq: Four times a day (QID) | OROMUCOSAL | 0 refills | Status: DC

## 2021-09-13 MED ORDER — HYDROCODONE-ACETAMINOPHEN 7.5-325 MG/15ML PO SOLN: 5.0000 mL | Freq: Four times a day (QID) | ORAL | 0 refills | Status: AC | PRN

## 2021-09-13 MED ORDER — NYSTATIN 100000 UNIT/ML MT SUSP: 5.0000 mL | Freq: Four times a day (QID) | OROMUCOSAL | Status: DC

## 2021-09-13 MED ORDER — ZINC OXIDE 40 % EX OINT: TOPICAL_OINTMENT | Freq: Two times a day (BID) | CUTANEOUS | 0 refills | Status: DC

## 2021-09-13 MED ORDER — OSMOLITE 1.5 CAL PO LIQD
1000.0000 mL | ORAL | 30 refills | Status: DC
Start: 2021-09-13 — End: 2021-09-26

## 2021-09-13 MED ORDER — PROSOURCE TF PO LIQD: 45.0000 mL | Freq: Three times a day (TID) | ORAL | 90 refills | Status: DC

## 2021-09-13 NOTE — Progress Notes (Signed)
[address]       [place] [address]             [phone]      8 Days Post-Op Procedure(s) (LRB): XI ROBOTIC ASSISTED IVOR LEWIS ESOPHAGECTOMY (N/A) ESOPHAGOGASTRODUODENOSCOPY (EGD) (N/A) INTERCOSTAL NERVE BLOCK JEJUNOSTOMY Subjective: Feels ok, anxious for discharge  Objective: Vital signs in last 24 hours: Temp:  [97.7 F (36.5 C)-98.8 F (37.1 C)] 98.7 F (37.1 C) (11/22 0410) Pulse Rate:  [93-97] 97 (11/22 0410) Cardiac Rhythm: Normal sinus rhythm (11/21 1908) Resp:  [16-19] 16 (11/22 0410) BP: (91-114)/(66-86) 101/69 (11/22 0410) SpO2:  [94 %-98 %] 95 % (11/22 0410) Weight:  [65.5 kg] 65.5 kg (11/22 0410)  Hemodynamic parameters for last 24 hours:    Intake/Output from previous day: 11/21 0701 - 11/22 0700 In: 3853.3 [I.V.:2944.4; NG/GT:908.9] Out: 540 [Urine:375; Drains:165] Intake/Output this shift: No intake/output data recorded.  General appearance: alert, cooperative, and no distress Heart: regular rate and rhythm and low grade tachy, low 100's Lungs: slightly dim in bases Abdomen: benign Extremities: no edema/calf tenderness Wound: some erethema, maceration with minor drainage of J tube site  Lab Results: No results for input(s): WBC, HGB, HCT, PLT in the last 72 hours. BMET:  Recent Labs    09/11/21 0500 09/12/21 0515  NA 133* 135  K 3.8 3.7  CL 104 105  CO2 24 25  GLUCOSE 134* 132*  BUN 8 9  CREATININE 0.62 0.54*  CALCIUM 8.0* 8.1*    PT/INR: No results for input(s): LABPROT, INR in the last 72 hours. ABG    Component Value Date/Time   PHART 7.383 09/05/2021 1746   HCO3 26.4 09/05/2021 1746   TCO2 28 09/05/2021 1746   ACIDBASEDEF 0.1 09/02/2021 0923   O2SAT 97.0 09/05/2021 1746   CBG (last 3)  Recent Labs    09/12/21 1959 09/12/21 2331 09/13/21 0408  GLUCAP 97 111* 133*    Meds Scheduled Meds:  Chlorhexidine Gluconate Cloth  6 each Topical Daily   feeding supplement  1 Container Oral TID BM    feeding supplement (OSMOLITE 1.5 CAL)  1,000 mL Per Tube Q24H   feeding supplement (PROSource TF)  45 mL Per Tube TID   insulin aspart  0-24 Units Subcutaneous Q4H   metoprolol succinate  25 mg Oral Daily   omeprazole  40 mg Oral BID AC   Continuous Infusions:  sodium chloride Stopped (09/12/21 1214)   PRN Meds:.magic mouthwash, menthol-cetylpyridinium, morphine injection, ondansetron (ZOFRAN) IV, phenol  Xrays No results found.  Assessment/Plan: S/P Procedure(s) (LRB): XI ROBOTIC ASSISTED IVOR LEWIS ESOPHAGECTOMY (N/A) ESOPHAGOGASTRODUODENOSCOPY (EGD) (N/A) INTERCOSTAL NERVE BLOCK JEJUNOSTOMY  1 afeb, VSS, sinus rhythm 2 sats good on RA 3 UOP not accurate but appears ok 4 no new labs, CBG ok, won't need insulin at home 5 no issues with swallow per speech, plan for ongoing nocturnal TF's for now, D3 diet thin liquids.   PER TOC team Patient will need to have noturnal tube feeds, NCM contacted Carolynn Sayers to follow for tube feeding teaching and supplies. NCM spoke with patient and care giver at bedside, [name] the caregiver states she has been in the medical field for years and she will be doing the tube feedings at night for patient so they will not need a HHRN she will also be doing the jp drain care.  NCM awaiting to hear from [facility] if they can take care of the supplies for patient.  Patient states he  does not want a 3 n 1 or rollator.  [employer] is not able to provide the tube feeding supplies for patient. [employer] will supply the tube feeds for patient, and patient and caregiiver is ok with that.  Staff RN will send patient home with 3 days worth of tube feeds and the [employer] rep will go over with the caregiver on how to use the pump, they will deliver the rest of the tube feeds to patient 's home.   6 have asked the wound care team to give recs on j tube site care 7 home later today when all equipment, etc in place  LOS: 8 days    John Giovanni PA-C Pager [phone] 09/13/2021

## 2021-09-13 NOTE — Consult Note (Signed)
[place] Nurse Consult Note: Reason for Consult: Consult requested for feeding tube site.  Tube is sutured in place around the edges and this limits the topical treatment which can be applied.  Area underneath is red, itchy and painful with partial thickness skin loss, appearance is consistent with moisture associated skin damage.  Dressing procedure/placement/frequency: Topical treatment orders provided for bedside nurses to perform as follows to protect and promote healing: Apply Desitin to feeding tube site BID and PRN if itching/burning, then cover with split thickness gauze and tape. Please re-consult if further assistance is needed.  Thank-you,  Julien Girt MSN, [place], [place], [place], [place]

## 2021-09-13 NOTE — TOC Transition Note (Signed)
Transition of Care [facility]) - CM/SW Discharge Note   Patient Details  Name: Nathaniel Jennings MRN: [id number] Date of Birth: 1955-12-26  Transition of [facility]) CM/SW Contact:  Zenon Mayo, RN Phone Number: 09/13/2021, 9:01 AM   Clinical Narrative:    Patient is for dc today, they will take home 3 days worth of tube feeds with them, [employer] will have the 4th day and forward feeds delivered to patient's home and they will deliver the pole and the tube feed pump today to patient room and show care giver, [name] how to work it.  [name] will be transporting patient home today.  NCM also aksed again if she needs a HHRN, she states they do not , she is in the medical field.  NCM informed Echo PA of this information.      Barriers to Discharge: Continued Medical Work up   Patient Goals and CMS Choice Patient states their goals for this hospitalization and ongoing recovery are:: to go home      Discharge Placement                       Discharge Plan and Services   Discharge Planning Services: CM Consult Post Acute Care Choice: NA          DME Arranged: N/A         HH Arranged: Refused HH          Social Determinants of Health (SDOH) Interventions     Readmission Risk Interventions No flowsheet data found.

## 2021-09-14 LAB — BPAM RBC
Blood Product Expiration Date: 202211292359
Blood Product Expiration Date: 202211292359
ISSUE DATE / TIME: 202211140748
ISSUE DATE / TIME: 202211140748
Unit Type and Rh: 6200
Unit Type and Rh: 6200

## 2021-09-14 LAB — TYPE AND SCREEN
ABO/RH(D): A POS
Antibody Screen: NEGATIVE
Unit division: 0
Unit division: 0

## 2021-09-20 ENCOUNTER — Other Ambulatory Visit: Payer: Self-pay | Admitting: Thoracic Surgery (Cardiothoracic Vascular Surgery)

## 2021-09-20 DIAGNOSIS — Z9049 Acquired absence of other specified parts of digestive tract: Secondary | ICD-10-CM

## 2021-09-20 DIAGNOSIS — Z9889 Other specified postprocedural states: Secondary | ICD-10-CM

## 2021-09-23 ENCOUNTER — Ambulatory Visit
Admission: RE | Admit: 2021-09-23 | Discharge: 2021-09-23 | Disposition: A | Discharge status: Home / Self Care | Payer: BC Managed Care – PPO | Source: Ambulatory Visit | Attending: Thoracic Surgery (Cardiothoracic Vascular Surgery) | Admitting: Thoracic Surgery (Cardiothoracic Vascular Surgery)

## 2021-09-23 ENCOUNTER — Other Ambulatory Visit: Payer: Self-pay

## 2021-09-23 ENCOUNTER — Ambulatory Visit (INDEPENDENT_AMBULATORY_CARE_PROVIDER_SITE_OTHER): Payer: Self-pay | Admitting: Thoracic Surgery (Cardiothoracic Vascular Surgery)

## 2021-09-23 VITALS — BP 112/74 | HR 89 | Resp 20 | Ht 66.0 in | Wt 144.0 lb

## 2021-09-23 DIAGNOSIS — Z9049 Acquired absence of other specified parts of digestive tract: Secondary | ICD-10-CM

## 2021-09-23 DIAGNOSIS — Z9889 Other specified postprocedural states: Secondary | ICD-10-CM

## 2021-09-23 NOTE — Progress Notes (Signed)
      [address]       [place] [address]             [phone]        Griff L Deahl [place] Medical Record #[id number] Date of Birth: March 25, 1956  Referring: Arta Silence, MD Primary Care: Cyndi Bender, PA-C Primary Cardiologist:None  Reason for visit:   follow-up  History of Present Illness:     Mr. Kneale comes in for his first follow-up appointment after undergoing a robotic assisted esophagectomy.  He is tolerating 3 meals a day without difficulty.  His chest tube output has been serous.  Physical Exam: BP 112/74 (BP Location: Right Arm, Patient Position: Sitting)   Pulse 89   Resp 20   Ht 5\' 6"  (1.676 m)   Wt 144 lb (65.3 kg)   SpO2 98% Comment: RA  BMI 23.24 kg/m   Alert NAD Incision clean, stitch removed.   Abdomen soft, ND.  J-tube was also removed No peripheral edema   Diagnostic Studies & Laboratory data: CXR: Clear Path: FINAL MICROSCOPIC DIAGNOSIS:   A. ESOPHAGOGASTRECTOMY:  - Invasive poorly differentiated adenocarcinoma.  - Metastatic carcinoma involving 5 of 13 lymph nodes (5/13).  - See oncology table below.   B. LYMPH NODE, LEVEL 8, EXCISION:  -  - One lymph node, negative for malignancy (0/1).    Assessment / Plan:   65 year old gentleman status post robotic assisted Ivor Lewis esophagectomy.  He originally was a T3 N2 M1 stage IV poorly differentiated adenocarcinoma.  There were 5 of 13 lymph nodes that were positive for metastasis.  He is doing well following the operation.  He is tolerating a regular diet without any complications.  He is able to eat more food thus have removed his jejunostomy tube.  He is scheduled to meet with medical oncology next week.  I will see him back in 1 month with a chest x-ray.   Lajuana Matte 09/23/2021 4:36 PM

## 2021-09-26 ENCOUNTER — Other Ambulatory Visit: Payer: Self-pay

## 2021-09-26 ENCOUNTER — Inpatient Hospital Stay: Payer: BC Managed Care – PPO | Attending: Hematology

## 2021-09-26 ENCOUNTER — Encounter: Payer: Self-pay | Admitting: Hematology

## 2021-09-26 ENCOUNTER — Inpatient Hospital Stay (HOSPITAL_BASED_OUTPATIENT_CLINIC_OR_DEPARTMENT_OTHER): Payer: BC Managed Care – PPO | Admitting: Hematology

## 2021-09-26 VITALS — BP 123/83 | HR 88 | Temp 97.8°F | Resp 16 | Ht 66.0 in | Wt 144.5 lb

## 2021-09-26 DIAGNOSIS — I10 Essential (primary) hypertension: Secondary | ICD-10-CM | POA: Diagnosis not present

## 2021-09-26 DIAGNOSIS — Z95828 Presence of other vascular implants and grafts: Secondary | ICD-10-CM

## 2021-09-26 DIAGNOSIS — C158 Malignant neoplasm of overlapping sites of esophagus: Secondary | ICD-10-CM | POA: Insufficient documentation

## 2021-09-26 DIAGNOSIS — R590 Localized enlarged lymph nodes: Secondary | ICD-10-CM | POA: Insufficient documentation

## 2021-09-26 DIAGNOSIS — N400 Enlarged prostate without lower urinary tract symptoms: Secondary | ICD-10-CM | POA: Insufficient documentation

## 2021-09-26 DIAGNOSIS — Z5112 Encounter for antineoplastic immunotherapy: Secondary | ICD-10-CM | POA: Diagnosis not present

## 2021-09-26 DIAGNOSIS — Z79899 Other long term (current) drug therapy: Secondary | ICD-10-CM | POA: Diagnosis not present

## 2021-09-26 LAB — CBC WITH DIFFERENTIAL (CANCER CENTER ONLY)
Abs Immature Granulocytes: 0.04 10*3/uL (ref 0.00–0.07)
Basophils Absolute: 0.1 10*3/uL (ref 0.0–0.1)
Basophils Relative: 1 %
Eosinophils Absolute: 0.3 10*3/uL (ref 0.0–0.5)
Eosinophils Relative: 6 %
HCT: 30.3 % — ABNORMAL LOW (ref 39.0–52.0)
Hemoglobin: 10.2 g/dL — ABNORMAL LOW (ref 13.0–17.0)
Immature Granulocytes: 1 %
Lymphocytes Relative: 10 %
Lymphs Abs: 0.5 10*3/uL — ABNORMAL LOW (ref 0.7–4.0)
MCH: 33.1 pg (ref 26.0–34.0)
MCHC: 33.7 g/dL (ref 30.0–36.0)
MCV: 98.4 fL (ref 80.0–100.0)
Monocytes Absolute: 0.5 10*3/uL (ref 0.1–1.0)
Monocytes Relative: 10 %
Neutro Abs: 3.7 10*3/uL (ref 1.7–7.7)
Neutrophils Relative %: 72 %
Platelet Count: 248 10*3/uL (ref 150–400)
RBC: 3.08 MIL/uL — ABNORMAL LOW (ref 4.22–5.81)
RDW: 13.2 % (ref 11.5–15.5)
WBC Count: 5.2 10*3/uL (ref 4.0–10.5)
nRBC: 0 % (ref 0.0–0.2)

## 2021-09-26 LAB — CMP (CANCER CENTER ONLY)
ALT: 13 U/L (ref 0–44)
AST: 18 U/L (ref 15–41)
Albumin: 3.2 g/dL — ABNORMAL LOW (ref 3.5–5.0)
Alkaline Phosphatase: 97 U/L (ref 38–126)
Anion gap: 9 (ref 5–15)
BUN: 13 mg/dL (ref 8–23)
CO2: 25 mmol/L (ref 22–32)
Calcium: 8.8 mg/dL — ABNORMAL LOW (ref 8.9–10.3)
Chloride: 108 mmol/L (ref 98–111)
Creatinine: 0.72 mg/dL (ref 0.61–1.24)
GFR, Estimated: >60 mL/min (ref >60)
Glucose, Bld: 138 mg/dL — ABNORMAL HIGH (ref 70–99)
Potassium: 3.7 mmol/L (ref 3.5–5.1)
Sodium: 142 mmol/L (ref 135–145)
Total Bilirubin: 0.4 mg/dL (ref 0.3–1.2)
Total Protein: 6.8 g/dL (ref 6.5–8.1)

## 2021-09-26 MED ORDER — HEPARIN SOD (PORK) LOCK FLUSH 100 UNIT/ML IV SOLN
500.0000 [IU] | Freq: Once | INTRAVENOUS | Status: AC
  Administered 2021-09-26: 500 [IU]

## 2021-09-26 MED ORDER — SODIUM CHLORIDE 0.9% FLUSH
10.0000 mL | Freq: Once | INTRAVENOUS | Status: AC
  Administered 2021-09-26: 10 mL

## 2021-09-26 NOTE — Progress Notes (Signed)
OFF PATHWAY REGIMEN - Gastroesophageal  No Change  Continue With Treatment as Ordered.  Original Decision Date/Time: 06/13/2021 15:14   [id number] (Leucovorin IV D1 + Fluorouracil IV D1/CIV D1,2 + Oxaliplatin IV D1) q14 Days:   A cycle is every 14 days:     Oxaliplatin      Leucovorin      Fluorouracil      Fluorouracil   **Always confirm dose/schedule in your pharmacy ordering system**  Patient Characteristics: Esophageal & GE Junction, Adenocarcinoma, Preoperative or Nonsurgical Candidate (Clinical Staging), cT2 or Higher or cN+, Unresectable/Nonsurgical Candidate (Any cT) Histology: Adenocarcinoma Disease Classification: Esophageal Therapeutic Status: Preoperative or Nonsurgical Candidate (Clinical Staging) AJCC Grade: G2 AJCC 8 Stage Grouping: Unknown AJCC T Category: cTX AJCC N Category: cN1 AJCC M Category: cM0 Intent of Therapy: Curative Intent, Discussed with Patient

## 2021-09-26 NOTE — Progress Notes (Signed)
DISCONTINUE OFF PATHWAY REGIMEN - Gastroesophageal   [id number] (Leucovorin IV D1 + Fluorouracil IV D1/CIV D1,2 + Oxaliplatin IV D1) q14 Days:   A cycle is every 14 days:     Oxaliplatin      Leucovorin      Fluorouracil      Fluorouracil   **Always confirm dose/schedule in your pharmacy ordering system**  REASON: Other Reason PRIOR TREATMENT: Off Pathway: [id number] (Leucovorin IV D1 + Fluorouracil IV D1/CIV D1,2 + Oxaliplatin IV D1) q14 Days TREATMENT RESPONSE: Partial Response (PR)  START ON PATHWAY REGIMEN - Gastroesophageal     Cycles 1 through 8: A cycle is every 14 days:     Nivolumab    Cycles 9 and beyond: A cycle is every 28 days:     Nivolumab   **Always confirm dose/schedule in your pharmacy ordering system**  Patient Characteristics: Esophageal & GE Junction, Adenocarcinoma, Post-Neoadjuvant Therapy and Resection (Neoadjuvant Pathologic Staging), ypT+ or ypN+ Histology: Adenocarcinoma Disease Classification: Esophageal Therapeutic Status: Post-Neoadjuvant Therapy and Resection (Neoadjuvant Pathologic Staging) AJCC N Category: ypN2 AJCC M Category: cM0 AJCC 8 Stage Grouping: IIIB AJCC Grade: G3 AJCC T Category: ypT2 Intent of Therapy: Curative Intent, Discussed with Patient

## 2021-09-26 NOTE — Progress Notes (Signed)
[place]   [phone] [phone]   Clinic Follow up Note   Patient Care Team: Cyndi Bender, Hershal Coria as PCP - General (Physician Assistant) [name], MD as Consulting Physician (Oncology)  Date of Service:  09/26/2021  CHIEF COMPLAINT: f/u of esophageal cancer  CURRENT THERAPY:  PENDING Adjuvant nivolumab  ASSESSMENT & PLAN:  Nathaniel Jennings is a 65 y.o. male with   1. Two esophageal Adenocarcinoma in mid and distal esophagus, yp(T3, N2) with upper mediastinal adenopathy -He was initially referred to [facility] for dysphasia with solid food and regurgitation. -EGD performed by Dr. Alessandra Bevels on 04/19/21 showed a partially obstructing, likely malignant, esophageal tumor in the distal esophagus and a polyp in the mid esophagus. Biopsy of both confirmed invasive moderately differentiated adenocarcinoma of the esophagus, Her2 negative. -PET scan 05/16/21 showed: 6.2 cm distal esophageal mass; hypermetabolic upper right paratracheal lymph node. No other distant metastasis.  -EBUS biopsy on 05/26/21, path from 2R lymph node confirmed malignant cells consistent with his primary esophageal cancer. This node is not resectable by surgery  -He began concurrent chemoRT with weekly TC on 05/24/21. He completed radiation therapy on 07/04/21. -We changed his chemo to FOLFOX on 06/20/21, at reduced dose for first cycle due to mild neutropenia and concurrent RT. He completed 4 cycles on 08/08/21. -he underwent esophagogastrectomy on 09/05/21 under Dr. Kipp Brood. Pathology showed invasive poorly differentiated adenocarcinoma, rare single tumor cells present.  Margins are negative.  Metastatic carcinoma involved 5 out of a total of 14 lymph nodes (5/14). I reviewed the results with pt and his wife today. -In light of the positive lymph nodes, he is at very high risk of recurrence. I discussed this with them today. I recommend adjuvant nivolumab for a year, based on NCCN guideline.  I reviewed the indications and side effects with them today. We will plan to start in 2 weeks. --Chemotherapy consent: Side effects including but does not not limited to, fatigue, skin changes, pneumonitis, colitis, arthritis, and other autoimmune phenomenon, thyroid dysfunction and other endocrine disorders,, were discussed with patient in great detail. He agrees to proceed. -The goal of therapy is curative -I also reviewed surveillance plan-- surveillance CT's every 4 months for the first year, then every 6 months for the next 2 years. -labs reviewed, he is recovering from surgery well-- hgb was 7.8 at time of discharge, up to 10.2 today.   2. Hypermetabolic thyroid nodule -seen on PET 05/16/21 (and not on head/neck US on 04/18/21) -repeat head/neck US on 06/03/21 showed a 1.1 cm inferior right nodule, corresponding to hypermetabolic focus on PET.  -Biopsy on 08/16/21 showed scant follicular epithelium.   3. HTN and BPH -continue medication, will monitor his blood pressure closely during treatment.   PLAN: -lab, flush, f/u, and nivolumab in 2, 4, and 6 weeks   No problem-specific Assessment & Plan notes found for this encounter.   SUMMARY OF ONCOLOGIC HISTORY: Oncology History Overview Note   Cancer Staging  Malignant neoplasm of overlapping sites of esophagus [facility]) Staging form: Esophagus - Adenocarcinoma, AJCC 8th Edition - Clinical stage from 05/13/2021: Stage IVA (cT3, cN2, cM0) - Signed by [name], MD on 05/22/2021    Malignant neoplasm of overlapping sites of esophagus ([place])  04/18/2021 Imaging   ULTRASOUND OF HEAD/NECK SOFT TISSUES  IMPRESSION: No suspicious lymphadenopathy   04/19/2021 Procedure   EGD  Impression: - Esophageal polyp(s) were found. Biopsied. - Partially obstructing, likely malignant esophageal tumor was found in the distal  esophagus. - Small hiatal hernia. - A single gastric polyp. Biopsied. - Normal duodenal bulb, first portion of the duodenum and  second portion of the duodenum.   04/19/2021 Pathology Results   FINAL MICROSCOPIC DIAGNOSIS: Stomach, Biopsy - FUNDIC GLAND POLYP. Negative for dysplasia. NO Helicobacter pylori organisms seen on H and E stain. Esophagus- Distal, Biopsy - INVASIVE MODERATELY DIFFERENTIATED ADENOCARCINOMA OF THE ESOPHAGUS.  - HER2 Study by Immunostain: Negative (score 1+) Esophagus- Mid, Biopsy - INVASIVE MODERATELY DIFFERENTIATED ADENOCARCINOMA OF THE ESOPHAGUS. - HER2 Study by Immunostain: Negative (score 1+)   05/05/2021 Imaging   CT CAP  IMPRESSION: 1. The patient's known esophageal mass is not well visualized. There is mild irregular wall thickening of the distal esophagus. Correlate with previous clinical evaluation. 2. Indeterminate mildly enlarged high right paratracheal node near the thoracic inlet. No other mediastinal adenopathy. 3. No evidence of metastatic disease in the abdomen or pelvis. 4. Stable small pulmonary nodules bilaterally consistent with benign findings. 5. Cholelithiasis, small renal cysts and Aortic Atherosclerosis (ICD10-I70.0).   05/09/2021 Initial Diagnosis   Malignant neoplasm of overlapping sites of esophagus ([place])   05/11/2021 Procedure   Upper EUS  Impression: - Mucosal nodule found in the esophagus. - Partially obstructing, malignant esophageal tumor was found at the gastroesophageal junction. - At least 3 discrete peritumoral lymph nodes - A mass was found in the gastroesophageal junction. A tissue diagnosis was obtained prior to this exam. This is of adenocarcinoma. This was staged T3 N2 Mx by endosonographic criteria. - Unable to traverse GE junction with either EGD scope or radial EUS scope.   05/13/2021 Cancer Staging   Staging form: Esophagus - Adenocarcinoma, AJCC 8th Edition - Clinical stage from 05/13/2021: Stage IVA (cT3, cN2, cM0) - Signed by [name], MD on 05/22/2021    05/16/2021 PET scan   IMPRESSION: 1. Distal esophageal mass measuring about  6.2 cm in length, maximum SUV 13.1. 2. Mildly hypermetabolic and mildly enlarged upper right paratracheal lymph node, 1.3 cm in short axis with maximum SUV 3.6, concerning for malignant involvement. 3. Hypodense 1.1 cm right thyroid nodule has a maximum SUV substantially above that of the background thyroid. A significant minority of such nodules can harbor thyroid cancer. Recommend thyroid US and biopsy (ref: J Am Coll Radiol. 2015 Feb;12(2): 143-50). 4. 3 by 4 mm right lower lobe pulmonary nodule is similar to 05/05/2021, not hypermetabolic but below sensitive PET-CT size thresholds. Surveillance recommended. 5. Other imaging findings of potential clinical significance: Chronic left maxillary sinusitis. Aortic Atherosclerosis (ICD10-I70.0). Cholelithiasis. Prostatomegaly.   05/24/2021 - 06/13/2021 Chemotherapy          05/26/2021 Pathology Results   FINAL MICROSCOPIC DIAGNOSIS:   A. LYMPH NODE, 2R, FINE NEEDLE ASPIRATION:  - Malignant cells consistent with non-small cell carcinoma   COMMENT:  The cells are positive for cytokeratin 20 and CDX-2 (patchy). TTF-1, NapsinA, cytokeratin 5/6, p40, S100, MelanA, and cytokeratin 7 are negative. The patient's history of esophageal adenocarcinoma is noted and CDX-2 positivity is consistent with a gastrointestinal primary. This cytokeratin7/20 profile is usually seen in lower tract tumors, but this pattern can be seen in gastric/esophageal cases.   05/27/2021 - 07/04/2021 Radiation Therapy   Per Dr. Isidore Moos   06/20/2021 - 08/08/2021 Chemotherapy   Patient is on Treatment Plan : GASTROESOPHAGEAL FOLFOX q14d x 6 cycles     08/04/2021 PET scan   IMPRESSION: 1. Interval development of a new 6 mm short axis right cervical node with hypermetabolism. Finding is somewhat indeterminate given  the apparent response of the hypermetabolic right paratracheal lymphadenopathy seen on the previous study. Close follow-up recommended as metastatic disease not  excluded. 2. Diffuse hypermetabolic FDG accumulation noted in the esophagus today with more diffuse circumferential esophageal wall thickening on today's study than on the previous exam. Hypermetabolism in the distal esophagus at the level of the hypermetabolic neoplasm previously has decreased from SUV max = 13 to SUV max = 7 today. 3. Stable tiny 3-4 mm right lower lobe and left upper lobe pulmonary nodules. Continued attention on follow-up recommended. 4. Cholelithiasis. 5. Emphysema.  (ICD10-J43.9) 6.  Aortic Atherosclerois (ICD10-170.0)   08/17/2021 Pathology Results   FINAL MICROSCOPIC DIAGNOSIS:  A. LYMPH NODE, RIGHT CERVICAL, FINE NEEDLE ASPIRATION:  - No malignant cells identified  - See comment   COMMENT:  There is scant cellularity and the specimen may not be representative.    09/05/2021 Definitive Surgery   FINAL MICROSCOPIC DIAGNOSIS:   A. ESOPHAGOGASTRECTOMY:  - Invasive poorly differentiated adenocarcinoma.  - Metastatic carcinoma involving 5 of 13 lymph nodes (5/13).  - See oncology table below.   B. LYMPH NODE, LEVEL 8, EXCISION:  - One lymph node, negative for malignancy (0/1).   10/10/2021 -  Chemotherapy   Patient is on Treatment Plan : GASTROESOPHAGEAL Nivolumab q14d x 8 cycles / Nivolumab q28d        INTERVAL HISTORY:  Nathaniel Jennings is here for a follow up of esophageal cancer. He was last seen by NP Lacie on 08/08/21. He presents to the clinic accompanied by his wife. He is recovering well from surgery. They report he was in the hospital for ~8 days. He had his feeding and drain tubes removed last Friday, 12/2. He reports he is eating anything he wants.   All other systems were reviewed with the patient and are negative.  MEDICAL HISTORY:  Past Medical History:  Diagnosis Date   Arthritis    BPH (benign prostatic hyperplasia)    Dyslipidemia    Elevated PSA    Esophageal cancer ([place])    Full dentures    GERD (gastroesophageal reflux disease)     History of melanoma excision    07/ 2011  s/p  wide excision left back--- per pathology-- negative maligant --  mild melanocytic atypia   History of palpitations    cardiologist --  dr Shelva Majestic (last note in epic 2014   Hypertension    Sleep apnea     SURGICAL HISTORY: Past Surgical History:  Procedure Laterality Date   COLONOSCOPY  10/24/2007   ESOPHAGOGASTRODUODENOSCOPY N/A 09/05/2021   Procedure: ESOPHAGOGASTRODUODENOSCOPY (EGD);  Surgeon: Lajuana Matte, MD;  [facility];  Service: Thoracic;  Laterality: N/A;   ESOPHAGOGASTRODUODENOSCOPY (EGD) WITH PROPOFOL N/A 05/11/2021   Procedure: ESOPHAGOGASTRODUODENOSCOPY (EGD) WITH PROPOFOL;  Surgeon: Arta Silence, MD;  Location: [facility] ENDOSCOPY;  Service: Endoscopy;  Laterality: N/A;   EUS N/A 05/11/2021   Procedure: ESOPHAGEAL ENDOSCOPIC ULTRASOUND (EUS) RADIAL;  Surgeon: Arta Silence, MD;  Location: [facility] ENDOSCOPY;  Service: Endoscopy;  Laterality: N/A;   EYE SURGERY     Lasik surgery on both eyes, but still needs glasses   FINE NEEDLE ASPIRATION  05/26/2021   Procedure: FINE NEEDLE ASPIRATION (FNA) LINEAR;  Surgeon: Garner Nash, DO;  Location: [place] [facility] ENDOSCOPY;  Service: Pulmonary;;   INTERCOSTAL NERVE BLOCK  09/05/2021   Procedure: INTERCOSTAL NERVE BLOCK;  Surgeon: Lajuana Matte, MD;  Location: [place];  Service: Thoracic;;   IR IMAGING GUIDED PORT INSERTION  06/30/2021  JEJUNOSTOMY  09/05/2021   Procedure: JEJUNOSTOMY;  Surgeon: Lajuana Matte, MD;  Location: [place];  Service: Thoracic;;   PROSTATE BIOPSY N/A 07/02/2015   Procedure: BIOPSY TRANSRECTAL ULTRASONIC PROSTATE (TUBP);  Surgeon: Lowella Bandy, MD;  Location[facility];  Service: Urology;  Laterality: N/A;   PROSTATE BIOPSY N/A 11/22/2015   Procedure: BIOPSY TRANSRECTAL ULTRASONIC PROSTATE (TUBP);  Surgeon: Cleon Gustin, MD;  Location: [facility];  Service: Urology;  Laterality: N/A;   TRANSTHORACIC ECHOCARDIOGRAM   11/19/2012   normal echo/  trivial TR   VIDEO BRONCHOSCOPY WITH ENDOBRONCHIAL ULTRASOUND N/A 05/26/2021   Procedure: VIDEO BRONCHOSCOPY WITH ENDOBRONCHIAL ULTRASOUND;  Surgeon: Garner Nash, DO;  Location: [place];  Service: Pulmonary;  Laterality: N/A;   WIDE EXCISION MELANOMA LEFT BACK  05/02/2010    I have reviewed the social history and family history with the patient and they are unchanged from previous note.  ALLERGIES:  is allergic to bee venom.  MEDICATIONS:  Current Outpatient Medications  Medication Sig Dispense Refill   aspirin EC 81 MG tablet Take 81 mg by mouth in the morning.     pantoprazole (PROTONIX) 40 MG tablet Take 1 tablet (40 mg total) by mouth 2 (two) times daily. 30 tablet 1   triamcinolone cream (KENALOG) 0.1 % Apply 1 application topically 2 (two) times daily as needed (eczema).     metoprolol succinate (TOPROL-XL) 50 MG 24 hr tablet Take 50 mg by mouth daily. Take with or immediately following a meal.     No current facility-administered medications for this visit.    PHYSICAL EXAMINATION: ECOG PERFORMANCE STATUS: 2 - Symptomatic, <50% confined to bed  Vitals:   09/26/21 1305  BP: 123/83  Pulse: 88  Resp: 16  Temp: 97.8 F (36.6 C)  SpO2: 99%   Wt Readings from Last 3 Encounters:  09/26/21 144 lb 8 oz (65.5 kg)  09/23/21 144 lb (65.3 kg)  09/13/21 144 lb 6.4 oz (65.5 kg)     GENERAL:alert, no distress and comfortable SKIN: skin color normal, no rashes or significant lesions EYES: normal, Conjunctiva are pink and non-injected, sclera clear  NEURO: alert & oriented x 3 with fluent speech  LABORATORY DATA:  I have reviewed the data as listed CBC Latest Ref Rng & Units 09/26/2021 09/10/2021 09/08/2021  WBC 4.0 - 10.5 K/uL 5.2 3.9(L) 4.4  Hemoglobin 13.0 - 17.0 g/dL 10.2(L) 7.8(L) 8.0(L)  Hematocrit 39.0 - 52.0 % 30.3(L) 22.6(L) 24.3(L)  Platelets 150 - 400 K/uL 248 112(L) 90(L)     CMP Latest Ref Rng & Units 09/26/2021 09/12/2021  09/11/2021  Glucose 70 - 99 mg/dL 138(H) 132(H) 134(H)  BUN 8 - 23 mg/dL _0 Creatinine 0.61 - 1.24 mg/dL 0.72 0.54(L) 0.62  Sodium 135 - 145 mmol/L 142 135 133(L)  Potassium 3.5 - 5.1 mmol/L 3.7 3.7 3.8  Chloride 98 - 111 mmol/L 108 105 104  CO2 22 - 32 mmol/L _1 Calcium 8.9 - 10.3 mg/dL 8.8(L) 8.1(L) 8.0(L)  Total Protein 6.5 - 8.1 g/dL 6.8 - -  Total Bilirubin 0.3 - 1.2 mg/dL 0.4 - -  Alkaline Phos 38 - 126 U/L 97 - -  AST 15 - 41 U/L 18 - -  ALT 0 - 44 U/L 13 - -      RADIOGRAPHIC STUDIES: I have personally reviewed the radiological images as listed and agreed with the findings in the report. No results found.    Orders  Placed This Encounter  Procedures   T4    Standing Status:   Standing    Number of Occurrences:   20    Standing Expiration Date:   09/26/2022   TSH    Standing Status:   Standing    Number of Occurrences:   20    Standing Expiration Date:   09/26/2022   All questions were answered. The patient knows to call the clinic with any problems, questions or concerns. No barriers to learning was detected. The total time spent in the appointment was 40 minutes.     [name], MD 09/26/2021   I, Wilburn Mylar, am acting as scribe for [name], MD.   I have reviewed the above documentation for accuracy and completeness, and I agree with the above.

## 2021-09-29 ENCOUNTER — Telehealth: Payer: Self-pay | Admitting: Emergency Medicine

## 2021-09-29 NOTE — Telephone Encounter (Signed)
S2013 - Immune Checkpoint Inhibitor Toxicity (I-CHECKIT): A Prospective Observational Study  09/29/21  Called to introduce this study to the patient.  Discussed the study procedures with the patient.  Mailed consent documents to the patient at his request.  Will follow up with the patient next week once he has had time to receive and review the documents.  He was thanked for his time and consideration.  Clabe Seal Clinical Research Coordinator I  09/29/21  10:22 AM

## 2021-10-03 NOTE — Progress Notes (Signed)
Pharmacist Chemotherapy Monitoring - Initial Assessment    Anticipated start date: 10/10/21   The following has been reviewed per standard work regarding the patient's treatment regimen: The patient's diagnosis, treatment plan and drug doses, and organ/hematologic function Lab orders and baseline tests specific to treatment regimen  The treatment plan start date, drug sequencing, and pre-medications Prior authorization status  Patient's documented medication list, including drug-drug interaction screen and prescriptions for anti-emetics and supportive care specific to the treatment regimen The drug concentrations, fluid compatibility, administration routes, and timing of the medications to be used The patient's access for treatment and lifetime cumulative dose history, if applicable  The patient's medication allergies and previous infusion related reactions, if applicable   Changes made to treatment plan:  N/A  Follow up needed:  Pending authorization for treatment     Kennith Center, Pharm.D., CPP 10/03/2021@9 :28 AM

## 2021-10-10 ENCOUNTER — Inpatient Hospital Stay: Payer: BC Managed Care – PPO

## 2021-10-10 ENCOUNTER — Inpatient Hospital Stay: Payer: BC Managed Care – PPO | Admitting: Nutrition

## 2021-10-10 ENCOUNTER — Inpatient Hospital Stay (HOSPITAL_BASED_OUTPATIENT_CLINIC_OR_DEPARTMENT_OTHER): Payer: BC Managed Care – PPO | Admitting: Hematology

## 2021-10-10 ENCOUNTER — Encounter: Payer: Self-pay | Admitting: Hematology

## 2021-10-10 ENCOUNTER — Other Ambulatory Visit: Payer: Self-pay

## 2021-10-10 VITALS — BP 142/88 | HR 71 | Temp 98.4°F | Resp 18

## 2021-10-10 DIAGNOSIS — R590 Localized enlarged lymph nodes: Secondary | ICD-10-CM | POA: Diagnosis not present

## 2021-10-10 DIAGNOSIS — N400 Enlarged prostate without lower urinary tract symptoms: Secondary | ICD-10-CM | POA: Diagnosis not present

## 2021-10-10 DIAGNOSIS — C158 Malignant neoplasm of overlapping sites of esophagus: Secondary | ICD-10-CM

## 2021-10-10 DIAGNOSIS — Z5112 Encounter for antineoplastic immunotherapy: Secondary | ICD-10-CM | POA: Diagnosis not present

## 2021-10-10 DIAGNOSIS — Z79899 Other long term (current) drug therapy: Secondary | ICD-10-CM | POA: Diagnosis not present

## 2021-10-10 DIAGNOSIS — I10 Essential (primary) hypertension: Secondary | ICD-10-CM | POA: Diagnosis not present

## 2021-10-10 DIAGNOSIS — Z95828 Presence of other vascular implants and grafts: Secondary | ICD-10-CM

## 2021-10-10 LAB — CBC WITH DIFFERENTIAL (CANCER CENTER ONLY)
Abs Immature Granulocytes: 0.02 10*3/uL (ref 0.00–0.07)
Basophils Absolute: 0.1 10*3/uL (ref 0.0–0.1)
Basophils Relative: 1 %
Eosinophils Absolute: 0.3 10*3/uL (ref 0.0–0.5)
Eosinophils Relative: 7 %
HCT: 30.6 % — ABNORMAL LOW (ref 39.0–52.0)
Hemoglobin: 10.7 g/dL — ABNORMAL LOW (ref 13.0–17.0)
Immature Granulocytes: 0 %
Lymphocytes Relative: 11 %
Lymphs Abs: 0.5 10*3/uL — ABNORMAL LOW (ref 0.7–4.0)
MCH: 33 pg (ref 26.0–34.0)
MCHC: 35 g/dL (ref 30.0–36.0)
MCV: 94.4 fL (ref 80.0–100.0)
Monocytes Absolute: 0.5 10*3/uL (ref 0.1–1.0)
Monocytes Relative: 10 %
Neutro Abs: 3.2 10*3/uL (ref 1.7–7.7)
Neutrophils Relative %: 71 %
Platelet Count: 167 10*3/uL (ref 150–400)
RBC: 3.24 MIL/uL — ABNORMAL LOW (ref 4.22–5.81)
RDW: 13.3 % (ref 11.5–15.5)
WBC Count: 4.5 10*3/uL (ref 4.0–10.5)
nRBC: 0 % (ref 0.0–0.2)

## 2021-10-10 LAB — CMP (CANCER CENTER ONLY)
ALT: 11 U/L (ref 0–44)
AST: 19 U/L (ref 15–41)
Albumin: 3.4 g/dL — ABNORMAL LOW (ref 3.5–5.0)
Alkaline Phosphatase: 90 U/L (ref 38–126)
Anion gap: 9 (ref 5–15)
BUN: 13 mg/dL (ref 8–23)
CO2: 25 mmol/L (ref 22–32)
Calcium: 8.9 mg/dL (ref 8.9–10.3)
Chloride: 108 mmol/L (ref 98–111)
Creatinine: 0.75 mg/dL (ref 0.61–1.24)
GFR, Estimated: >60 mL/min (ref >60)
Glucose, Bld: 111 mg/dL — ABNORMAL HIGH (ref 70–99)
Potassium: 3.3 mmol/L — ABNORMAL LOW (ref 3.5–5.1)
Sodium: 142 mmol/L (ref 135–145)
Total Bilirubin: 0.4 mg/dL (ref 0.3–1.2)
Total Protein: 6.8 g/dL (ref 6.5–8.1)

## 2021-10-10 LAB — TSH: TSH: 1.062 u[IU]/mL (ref 0.320–4.118)

## 2021-10-10 MED ORDER — HEPARIN SOD (PORK) LOCK FLUSH 100 UNIT/ML IV SOLN
500.0000 [IU] | Freq: Once | INTRAVENOUS | Status: AC | PRN
  Administered 2021-10-10: 16:00:00 500 [IU]

## 2021-10-10 MED ORDER — SODIUM CHLORIDE 0.9 % IV SOLN
240.0000 mg | Freq: Once | INTRAVENOUS | Status: AC
  Administered 2021-10-10: 15:00:00 240 mg via INTRAVENOUS
  Filled 2021-10-10: qty 24

## 2021-10-10 MED ORDER — SODIUM CHLORIDE 0.9 % IV SOLN: Freq: Once | INTRAVENOUS | Status: AC

## 2021-10-10 MED ORDER — SODIUM CHLORIDE 0.9% FLUSH
10.0000 mL | INTRAVENOUS | Status: DC | PRN
  Administered 2021-10-10: 16:00:00 10 mL

## 2021-10-10 MED ORDER — SODIUM CHLORIDE 0.9% FLUSH
10.0000 mL | Freq: Once | INTRAVENOUS | Status: AC
  Administered 2021-10-10: 14:00:00 10 mL

## 2021-10-10 MED ORDER — LIDOCAINE-PRILOCAINE 2.5-2.5 % EX CREA: 1.0000 "application " | TOPICAL_CREAM | CUTANEOUS | 0 refills | Status: DC | PRN

## 2021-10-10 NOTE — Patient Instructions (Signed)
[facility]   Discharge Instructions: Thank you for choosing [facility] to provide your oncology and hematology care.   If you have a lab appointment with the [facility], please go directly to the [facility] and check in at the registration area.   Wear comfortable clothing and clothing appropriate for easy access to any Portacath or PICC line.   We strive to give you quality time with your provider. You may need to reschedule your appointment if you arrive late (15 or more minutes).  Arriving late affects you and other patients whose appointments are after yours.  Also, if you miss three or more appointments without notifying the office, you may be dismissed from the clinic at the providers discretion.      For prescription refill requests, have your pharmacy contact our office and allow 72 hours for refills to be completed.    Today you received the following chemotherapy and/or immunotherapy agents: nivolumab.      To help prevent nausea and vomiting after your treatment, we encourage you to take your nausea medication as directed.  BELOW ARE SYMPTOMS THAT SHOULD BE REPORTED IMMEDIATELY: *FEVER GREATER THAN 100.4 F (38 C) OR HIGHER *CHILLS OR SWEATING *NAUSEA AND VOMITING THAT IS NOT CONTROLLED WITH YOUR NAUSEA MEDICATION *UNUSUAL SHORTNESS OF BREATH *UNUSUAL BRUISING OR BLEEDING *URINARY PROBLEMS (pain or burning when urinating, or frequent urination) *BOWEL PROBLEMS (unusual diarrhea, constipation, pain near the anus) TENDERNESS IN MOUTH AND THROAT WITH OR WITHOUT PRESENCE OF ULCERS (sore throat, sores in mouth, or a toothache) UNUSUAL RASH, SWELLING OR PAIN  UNUSUAL VAGINAL DISCHARGE OR ITCHING   Items with * indicate a potential emergency and should be followed up as soon as possible or go to the Emergency Department if any problems should occur.  Please show the CHEMOTHERAPY ALERT CARD or IMMUNOTHERAPY ALERT CARD at check-in  to the Emergency Department and triage nurse.  Should you have questions after your visit or need to cancel or reschedule your appointment, please contact [facility]  Dept: [phone]  and follow the prompts.  Office hours are 8:00 a.m. to 4:30 p.m. Monday - Friday. Please note that voicemails left after 4:00 p.m. may not be returned until the following business day.  We are closed weekends and major holidays. You have access to a nurse at all times for urgent questions. Please call the main number to the clinic Dept: [phone] and follow the prompts.   For any non-urgent questions, you may also contact your provider using MyChart. We now offer e-Visits for anyone 65 and older to request care online for non-urgent symptoms. For details visit [email or web address].   Also download the MyChart app! Go to the app store, search "MyChart", open the app, select [facility], and log in with your MyChart username and password.  Due to Covid, a mask is required upon entering the hospital/clinic. If you do not have a mask, one will be given to you upon arrival. For doctor visits, patients may have 1 support person aged 7 or older with them. For treatment visits, patients cannot have anyone with them due to current Covid guidelines and our immunocompromised population.   Nivolumab injection What is this medication? NIVOLUMAB (nye VOL ue mab) is a monoclonal antibody. It treats certain types of cancer. Some of the cancers treated are colon cancer, head and neck cancer, Hodgkin lymphoma, lung cancer, and melanoma. This medicine may be used for other purposes; ask  your health care provider or pharmacist if you have questions. COMMON BRAND NAME(S): Opdivo What should I tell my care team before I take this medication? They need to know if you have any of these conditions: Autoimmune diseases such as Crohn's disease, ulcerative colitis, or lupus Have had or planning to have  an allogeneic stem cell transplant (uses someone else's stem cells) History of chest radiation Organ transplant Nervous system problems such as myasthenia gravis or Guillain-Barre syndrome An unusual or allergic reaction to nivolumab, other medicines, foods, dyes, or preservatives Pregnant or trying to get pregnant Breast-feeding How should I use this medication? This medication is injected into a vein. It is given in a hospital or clinic setting. A special MedGuide will be given to you before each treatment. Be sure to read this information carefully each time. Talk to your care team regarding the use of this medication in children. While it may be prescribed for children as young as 12 years for selected conditions, precautions do apply. Overdosage: If you think you have taken too much of this medicine contact a poison control center or emergency room at once. NOTE: This medicine is only for you. Do not share this medicine with others. What if I miss a dose? Keep appointments for follow-up doses. It is important not to miss your dose. Call your care team if you are unable to keep an appointment. What may interact with this medication? Interactions have not been studied. This list may not describe all possible interactions. Give your health care provider a list of all the medicines, herbs, non-prescription drugs, or dietary supplements you use. Also tell them if you smoke, drink alcohol, or use illegal drugs. Some items may interact with your medicine. What should I watch for while using this medication? Your condition will be monitored carefully while you are receiving this medication. You may need blood work done while you are taking this medication. Do not become pregnant while taking this medication or for 5 months after stopping it. Women should inform their care team if they wish to become pregnant or think they might be pregnant. There is a potential for serious harm to an unborn child.  Talk to your care team for more information. Do not breast-feed an infant while taking this medication or for 5 months after stopping it. What side effects may I notice from receiving this medication? Side effects that you should report to your care team as soon as possible: Allergic reactions--skin rash, itching, hives, swelling of the face, lips, tongue, or throat Bloody or black, tar-like stools Change in vision Chest pain Diarrhea Dry cough, shortness of breath or trouble breathing Eye pain Fast or irregular heartbeat Fever, chills High blood sugar (hyperglycemia)--increased thirst or amount of urine, unusual weakness or fatigue, blurry vision High thyroid levels (hyperthyroidism)--fast or irregular heartbeat, weight loss, excessive sweating or sensitivity to heat, tremors or shaking, anxiety, nervousness, irregular menstrual cycle or spotting Kidney injury--decrease in the amount of urine, swelling of the ankles, hands, or feet Liver injury--right upper belly pain, loss of appetite, nausea, light-colored stool, dark yellow or brown urine, yellowing skin or eyes, unusual weakness or fatigue Low red blood cell count--unusual weakness or fatigue, dizziness, headache, trouble breathing Low thyroid levels (hypothyroidism)--unusual weakness or fatigue, increased sensitivity to cold, constipation, hair loss, dry skin, weight gain, feelings of depression Mood and behavior changes-confusion, change in sex drive or performance, irritability Muscle pain or cramps Pain, tingling, or numbness in the hands or feet, muscle weakness,  trouble walking, loss of balance or coordination Red or dark brown urine Redness, blistering, peeling, or loosening of the skin, including inside the mouth Stomach pain Unusual bruising or bleeding Side effects that usually do not require medical attention (report to your care team if they continue or are bothersome): Bone pain Constipation Loss of  appetite Nausea Tiredness Vomiting This list may not describe all possible side effects. Call your doctor for medical advice about side effects. You may report side effects to FDA at 1-800-FDA-1088. Where should I keep my medication? This medication is given in a hospital or clinic and will not be stored at home. NOTE: This sheet is a summary. It may not cover all possible information. If you have questions about this medicine, talk to your doctor, pharmacist, or health care provider.  2022 Elsevier/Gold Standard (2021-06-28 00:00:00)

## 2021-10-10 NOTE — Progress Notes (Signed)
Brief nutrition follow-up completed with patient in the infusion room.  Patient is status post esophagectomy and jejunostomy feeding tube placement.  Patient reports his jejunostomy tube was removed approximately 1 week after discharge.  Current weight is stable at 144.5 pounds.  Patient denies nutrition impact symptoms.  He did not tolerate a few sips of Ensure.  States he misses spicy food but was told by physician not to consume secondary to reflux.  Brief education provided on foods to avoid to minimize reflux.  Patient should monitor weight and strive for weight maintenance.  He has contact information if he develops any questions or concerns.  **Disclaimer: This note was dictated with voice recognition software. Similar sounding words can inadvertently be transcribed and this note may contain transcription errors which may not have been corrected upon publication of note.**

## 2021-10-10 NOTE — Progress Notes (Signed)
Ok to proceed with treatment today without CMP results per Dr Burr Medico

## 2021-10-10 NOTE — Progress Notes (Signed)
[facility]   Telephone:(336) [phone] Fax:(336) [phone]   Clinic Follow up Note   Patient Care Team: Cyndi Bender, Hershal Coria as PCP - General (Physician Assistant) [name], MD as Consulting Physician (Oncology)  Date of Service:  10/10/2021  CHIEF COMPLAINT: f/u of esophageal cancer  CURRENT THERAPY:  Adjuvant nivolumab, starting 10/10/21  ASSESSMENT & PLAN:  KAYDEN HUTMACHER is a 65 y.o. male with   1. 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 -EGD performed by Dr. Alessandra Bevels on 04/19/21 showed a partially obstructing, likely malignant, esophageal tumor in the distal esophagus and a polyp in the mid esophagus. Biopsy of both confirmed invasive moderately differentiated adenocarcinoma of the esophagus, Her2 negative. -PET scan 05/16/21 showed: 6.2 cm distal esophageal mass; hypermetabolic upper right paratracheal lymph node. No other distant metastasis.  -EBUS biopsy on 05/26/21, path from 2R lymph node confirmed malignant cells consistent with his primary esophageal cancer. This node is not resectable by surgery  -He began concurrent chemoRT with weekly TC on 05/24/21. He completed radiation therapy on 07/04/21. -We changed his chemo to FOLFOX on 06/20/21, at reduced dose for first cycle due to mild neutropenia and concurrent RT. He completed 4 cycles on 08/08/21. -he underwent esophagogastrectomy on 09/05/21 under Dr. Kipp Brood. Pathology showed invasive poorly differentiated adenocarcinoma, rare single tumor cells present.  Margins are negative.  Metastatic carcinoma involved 5 out of a total of 14 lymph nodes (5/14). I reviewed the results with pt and his wife today. -In light of the positive lymph nodes, the recommendation is for adjuvant nivolumab for one year. He is scheduled to begin today. We reviewed side effects again today  and will proceed. -he did not have emla cream prior to treatment; I will prescribe today. I advised him to apply it an hour before his treatment. -he is scheduled for routine f/u with Dr. Kipp Brood on 10/28/21.   2. Hypermetabolic thyroid nodule -seen on PET 05/16/21 (and not on head/neck US on 04/18/21) -repeat head/neck US on 06/03/21 showed a 1.1 cm inferior right nodule, corresponding to hypermetabolic focus on PET.  -Biopsy on 08/16/21 showed scant follicular epithelium.   3. HTN and BPH -continue medication, will monitor his blood pressure closely during treatment.     PLAN: -proceed with first nivolumab today -I called in emla cream today -lab, flush, f/u, and nivolumab in 2, 4, and 6 weeks -f/u with Dr. Kipp Brood 10/28/21. -pt declined the [facility] S2013 clinical trial    No problem-specific Assessment & Plan notes found for this encounter.   SUMMARY OF ONCOLOGIC HISTORY: Oncology History Overview Note   Cancer Staging  Malignant neoplasm of overlapping sites of esophagus [facility]) Staging form: Esophagus - Adenocarcinoma, AJCC 8th Edition - Clinical stage from 05/13/2021: Stage IVA (cT3, cN2, cM0) - Signed by [name], MD on 05/22/2021    Malignant neoplasm of overlapping sites of esophagus ([facility])  04/18/2021 Imaging   ULTRASOUND OF HEAD/NECK SOFT TISSUES  IMPRESSION: No suspicious lymphadenopathy   04/19/2021 Procedure   EGD  Impression: - Esophageal polyp(s) were found. Biopsied. - Partially obstructing, likely malignant esophageal tumor was found in the distal esophagus. - Small hiatal hernia. - A single gastric polyp. Biopsied. - Normal duodenal bulb, first portion of the duodenum and second portion of the duodenum.   04/19/2021 Pathology Results   FINAL MICROSCOPIC DIAGNOSIS: Stomach, Biopsy - FUNDIC GLAND POLYP. Negative  for dysplasia. NO Helicobacter pylori organisms seen on H and E stain. Esophagus- Distal, Biopsy - INVASIVE MODERATELY DIFFERENTIATED ADENOCARCINOMA  OF THE ESOPHAGUS.  - HER2 Study by Immunostain: Negative (score 1+) Esophagus- Mid, Biopsy - INVASIVE MODERATELY DIFFERENTIATED ADENOCARCINOMA OF THE ESOPHAGUS. - HER2 Study by Immunostain: Negative (score 1+)   05/05/2021 Imaging   CT CAP  IMPRESSION: 1. The patient's known esophageal mass is not well visualized. There is mild irregular wall thickening of the distal esophagus. Correlate with previous clinical evaluation. 2. Indeterminate mildly enlarged high right paratracheal node near the thoracic inlet. No other mediastinal adenopathy. 3. No evidence of metastatic disease in the abdomen or pelvis. 4. Stable small pulmonary nodules bilaterally consistent with benign findings. 5. Cholelithiasis, small renal cysts and Aortic Atherosclerosis (ICD10-I70.0).   05/09/2021 Initial Diagnosis   Malignant neoplasm of overlapping sites of esophagus ([place])   05/11/2021 Procedure   Upper EUS  Impression: - Mucosal nodule found in the esophagus. - Partially obstructing, malignant esophageal tumor was found at the gastroesophageal junction. - At least 3 discrete peritumoral lymph nodes - A mass was found in the gastroesophageal junction. A tissue diagnosis was obtained prior to this exam. This is of adenocarcinoma. This was staged T3 N2 Mx by endosonographic criteria. - Unable to traverse GE junction with either EGD scope or radial EUS scope.   05/13/2021 Cancer Staging   Staging form: Esophagus - Adenocarcinoma, AJCC 8th Edition - Clinical stage from 05/13/2021: Stage IVA (cT3, cN2, cM0) - Signed by [name], MD on 05/22/2021    05/16/2021 PET scan   IMPRESSION: 1. Distal esophageal mass measuring about 6.2 cm in length, maximum SUV 13.1. 2. Mildly hypermetabolic and mildly enlarged upper right paratracheal lymph node, 1.3 cm in short axis with maximum SUV 3.6, concerning for malignant involvement. 3. Hypodense 1.1 cm right thyroid nodule has a maximum SUV substantially above that of the  background thyroid. A significant minority of such nodules can harbor thyroid cancer. Recommend thyroid US and biopsy (ref: J Am Coll Radiol. 2015 Feb;12(2): 143-50). 4. 3 by 4 mm right lower lobe pulmonary nodule is similar to 05/05/2021, not hypermetabolic but below sensitive PET-CT size thresholds. Surveillance recommended. 5. Other imaging findings of potential clinical significance: Chronic left maxillary sinusitis. Aortic Atherosclerosis (ICD10-I70.0). Cholelithiasis. Prostatomegaly.   05/24/2021 - 06/13/2021 Chemotherapy          05/26/2021 Pathology Results   FINAL MICROSCOPIC DIAGNOSIS:   A. LYMPH NODE, 2R, FINE NEEDLE ASPIRATION:  - Malignant cells consistent with non-small cell carcinoma   COMMENT:  The cells are positive for cytokeratin 20 and CDX-2 (patchy). TTF-1, NapsinA, cytokeratin 5/6, p40, S100, MelanA, and cytokeratin 7 are negative. The patient's history of esophageal adenocarcinoma is noted and CDX-2 positivity is consistent with a gastrointestinal primary. This cytokeratin7/20 profile is usually seen in lower tract tumors, but this pattern can be seen in gastric/esophageal cases.   05/27/2021 - 07/04/2021 Radiation Therapy   Per Dr. Isidore Moos   06/20/2021 - 08/08/2021 Chemotherapy   Patient is on Treatment Plan : GASTROESOPHAGEAL FOLFOX q14d x 6 cycles     08/04/2021 PET scan   IMPRESSION: 1. Interval development of a new 6 mm short axis right cervical node with hypermetabolism. Finding is somewhat indeterminate given the apparent response of the hypermetabolic right paratracheal lymphadenopathy seen on the previous study. Close follow-up recommended as metastatic disease not excluded. 2. Diffuse hypermetabolic FDG accumulation noted in the esophagus today with more diffuse circumferential esophageal wall thickening on today's study than  on the previous exam. Hypermetabolism in the distal esophagus at the level of the hypermetabolic neoplasm previously has decreased  from SUV max = 13 to SUV max = 7 today. 3. Stable tiny 3-4 mm right lower lobe and left upper lobe pulmonary nodules. Continued attention on follow-up recommended. 4. Cholelithiasis. 5. Emphysema.  (ICD10-J43.9) 6.  Aortic Atherosclerois (ICD10-170.0)   08/17/2021 Pathology Results   FINAL MICROSCOPIC DIAGNOSIS:  A. LYMPH NODE, RIGHT CERVICAL, FINE NEEDLE ASPIRATION:  - No malignant cells identified  - See comment   COMMENT:  There is scant cellularity and the specimen may not be representative.    09/05/2021 Definitive Surgery   FINAL MICROSCOPIC DIAGNOSIS:   A. ESOPHAGOGASTRECTOMY:  - Invasive poorly differentiated adenocarcinoma.  - Metastatic carcinoma involving 5 of 13 lymph nodes (5/13).  - See oncology table below.   B. LYMPH NODE, LEVEL 8, EXCISION:  - One lymph node, negative for malignancy (0/1).   10/10/2021 -  Chemotherapy   Patient is on Treatment Plan : GASTROESOPHAGEAL Nivolumab q14d x 8 cycles / Nivolumab q28d        INTERVAL HISTORY:  AMORY SIMONETTI is here for a follow up of esophageal cancer. He was last seen by me on 09/26/21. He was seen in the infusion area. He notes his wife is waiting in the "blue room." He reports he is doing well overall, continuing to recover from surgery. He reports he went to work yesterday, he is eating well, and his bowel movements are diarrhea sometimes.   All other systems were reviewed with the patient and are negative.  MEDICAL HISTORY:  Past Medical History:  Diagnosis Date   Arthritis    BPH (benign prostatic hyperplasia)    Dyslipidemia    Elevated PSA    Esophageal cancer ([place])    Full dentures    GERD (gastroesophageal reflux disease)    History of melanoma excision    07/ 2011  s/p  wide excision left back--- per pathology-- negative maligant --  mild melanocytic atypia   History of palpitations    cardiologist --  dr Shelva Majestic (last note in epic 2014   Hypertension    Sleep apnea     SURGICAL  HISTORY: Past Surgical History:  Procedure Laterality Date   COLONOSCOPY  10/24/2007   ESOPHAGOGASTRODUODENOSCOPY N/A 09/05/2021   Procedure: ESOPHAGOGASTRODUODENOSCOPY (EGD);  Surgeon: Lajuana Matte, MD;  [facility] OR;  Service: Thoracic;  Laterality: N/A;   ESOPHAGOGASTRODUODENOSCOPY (EGD) WITH PROPOFOL N/A 05/11/2021   Procedure: ESOPHAGOGASTRODUODENOSCOPY (EGD) WITH PROPOFOL;  Surgeon: Arta Silence, MD;  Location: [facility] ENDOSCOPY;  Service: Endoscopy;  Laterality: N/A;   EUS N/A 05/11/2021   Procedure: ESOPHAGEAL ENDOSCOPIC ULTRASOUND (EUS) RADIAL;  Surgeon: Arta Silence, MD;  Location: [facility]OSCOPY;  Service: Endoscopy;  Laterality: N/A;   EYE SURGERY     Lasik surgery on both eyes, but still needs glasses   FINE NEEDLE ASPIRATION  05/26/2021   Procedure: FINE NEEDLE ASPIRATION (FNA) LINEAR;  Surgeon: Garner Nash, DO;  Location: [facility];  Service: Pulmonary;;   INTERCOSTAL NERVE BLOCK  09/05/2021   Procedure: INTERCOSTAL NERVE BLOCK;  Surgeon: Lajuana Matte, MD;  Location: [facility];  Service: Thoracic;;   IR IMAGING GUIDED PORT INSERTION  06/30/2021   JEJUNOSTOMY  09/05/2021   Procedure: Shanon Rosser;  Surgeon: Lajuana Matte, MD;  Location: [facility];  Service: Thoracic;;   PROSTATE BIOPSY N/A 07/02/2015   Procedure: BIOPSY TRANSRECTAL ULTRASONIC PROSTATE (TUBP);  Surgeon: Lowella Bandy, MD;  Location: [facility];  Service: Urology;  Laterality: N/A;   PROSTATE BIOPSY N/A 11/22/2015   Procedure: BIOPSY TRANSRECTAL ULTRASONIC PROSTATE (TUBP);  Surgeon: Cleon Gustin, MD;  Location: [facility];  Service: Urology;  Laterality: N/A;   TRANSTHORACIC ECHOCARDIOGRAM  11/19/2012   normal echo/  trivial TR   VIDEO BRONCHOSCOPY WITH ENDOBRONCHIAL ULTRASOUND N/A 05/26/2021   Procedure: VIDEO BRONCHOSCOPY WITH ENDOBRONCHIAL ULTRASOUND;  Surgeon: Garner Nash, DO;  Location: [facility];  Service: Pulmonary;  Laterality: N/A;   WIDE  EXCISION MELANOMA LEFT BACK  05/02/2010    I have reviewed the social history and family history with the patient and they are unchanged from previous note.  ALLERGIES:  is allergic to bee venom.  MEDICATIONS:  Current Outpatient Medications  Medication Sig Dispense Refill   lidocaine-prilocaine (EMLA) cream Apply 1 application topically as needed. 30 g 0   aspirin EC 81 MG tablet Take 81 mg by mouth in the morning.     metoprolol succinate (TOPROL-XL) 50 MG 24 hr tablet Take 50 mg by mouth daily. Take with or immediately following a meal.     pantoprazole (PROTONIX) 40 MG tablet Take 1 tablet (40 mg total) by mouth 2 (two) times daily. 30 tablet 1   triamcinolone cream (KENALOG) 0.1 % Apply 1 application topically 2 (two) times daily as needed (eczema).     No current facility-administered medications for this visit.   Facility-Administered Medications Ordered in Other Visits  Medication Dose Route Frequency Provider Last Rate Last Admin   sodium chloride flush (NS) 0.9 % injection 10 mL  10 mL Intracatheter PRN [name], MD   10 mL at 10/10/21 1556    PHYSICAL EXAMINATION: ECOG PERFORMANCE STATUS: 1 - Symptomatic but completely ambulatory  There were no vitals filed for this visit. Wt Readings from Last 3 Encounters:  09/26/21 144 lb 8 oz (65.5 kg)  09/23/21 144 lb (65.3 kg)  09/13/21 144 lb 6.4 oz (65.5 kg)     GENERAL:alert, no distress and comfortable SKIN: skin color normal, no rashes or significant lesions EYES: normal, Conjunctiva are pink and non-injected, sclera clear  NEURO: alert & oriented x 3 with fluent speech  LABORATORY DATA:  I have reviewed the data as listed CBC Latest Ref Rng & Units 10/10/2021 09/26/2021 09/10/2021  WBC 4.0 - 10.5 K/uL 4.5 5.2 3.9(L)  Hemoglobin 13.0 - 17.0 g/dL 10.7(L) 10.2(L) 7.8(L)  Hematocrit 39.0 - 52.0 % 30.6(L) 30.3(L) 22.6(L)  Platelets 150 - 400 K/uL 167 248 112(L)     CMP Latest Ref Rng & Units 10/10/2021 09/26/2021  09/12/2021  Glucose 70 - 99 mg/dL 111(H) 138(H) 132(H)  BUN 8 - 23 mg/dL _0 Creatinine 0.61 - 1.24 mg/dL 0.75 0.72 0.54(L)  Sodium 135 - 145 mmol/L 142 142 135  Potassium 3.5 - 5.1 mmol/L 3.3(L) 3.7 3.7  Chloride 98 - 111 mmol/L 108 108 105  CO2 22 - 32 mmol/L _1 Calcium 8.9 - 10.3 mg/dL 8.9 8.8(L) 8.1(L)  Total Protein 6.5 - 8.1 g/dL 6.8 6.8 -  Total Bilirubin 0.3 - 1.2 mg/dL 0.4 0.4 -  Alkaline Phos 38 - 126 U/L 90 97 -  AST 15 - 41 U/L 19 18 -  ALT 0 - 44 U/L 11 13 -      RADIOGRAPHIC STUDIES: I have personally reviewed the radiological images as listed and agreed with the findings in the report. No results found.    No  orders of the defined types were placed in this encounter.  All questions were answered. The patient knows to call the clinic with any problems, questions or concerns. No barriers to learning was detected. The total time spent in the appointment was 30 minutes.     [name], MD 10/10/2021   I, Wilburn Mylar, am acting as scribe for [name], MD.   I have reviewed the above documentation for accuracy and completeness, and I agree with the above.

## 2021-10-11 ENCOUNTER — Other Ambulatory Visit: Payer: Self-pay

## 2021-10-11 ENCOUNTER — Encounter: Payer: Self-pay | Admitting: Hematology

## 2021-10-11 LAB — T4: T4, Total: 9.3 ug/dL (ref 4.5–12.0)

## 2021-10-11 MED ORDER — POTASSIUM CHLORIDE CRYS ER 20 MEQ PO TBCR: 20.0000 meq | EXTENDED_RELEASE_TABLET | Freq: Every day | ORAL | 0 refills | Status: DC

## 2021-10-11 NOTE — Progress Notes (Signed)
Spoke with pt's wife via telephone regarding pt's K+ level.  Informed pt's spouse that Dr. Burr Medico has order Potassium Chloride 63meq PO daily and encourages the pt to eat food high in potassium.  Gave pt's wife examples of foods high in potassium.  Informed pt's spouse that pt is to take the K+ tablets for 1 wk.  Pt's spouse verbalized understanding and had no further questions or concerns.

## 2021-10-12 DIAGNOSIS — R351 Nocturia: Secondary | ICD-10-CM | POA: Diagnosis not present

## 2021-10-12 DIAGNOSIS — N401 Enlarged prostate with lower urinary tract symptoms: Secondary | ICD-10-CM | POA: Diagnosis not present

## 2021-10-13 ENCOUNTER — Other Ambulatory Visit: Payer: Self-pay | Admitting: Thoracic Surgery (Cardiothoracic Vascular Surgery)

## 2021-10-13 DIAGNOSIS — C158 Malignant neoplasm of overlapping sites of esophagus: Secondary | ICD-10-CM

## 2021-10-19 DIAGNOSIS — R351 Nocturia: Secondary | ICD-10-CM | POA: Diagnosis not present

## 2021-10-19 DIAGNOSIS — N401 Enlarged prostate with lower urinary tract symptoms: Secondary | ICD-10-CM | POA: Diagnosis not present

## 2021-10-19 DIAGNOSIS — R972 Elevated prostate specific antigen [PSA]: Secondary | ICD-10-CM | POA: Diagnosis not present

## 2021-10-25 ENCOUNTER — Other Ambulatory Visit: Payer: Self-pay

## 2021-10-25 ENCOUNTER — Inpatient Hospital Stay: Payer: BC Managed Care – PPO

## 2021-10-25 ENCOUNTER — Encounter: Payer: Self-pay | Admitting: Emergency Medicine

## 2021-10-25 ENCOUNTER — Inpatient Hospital Stay: Payer: BC Managed Care – PPO | Attending: Hematology

## 2021-10-25 VITALS — BP 109/73 | HR 80 | Temp 97.7°F | Resp 17 | Wt 142.0 lb

## 2021-10-25 DIAGNOSIS — C158 Malignant neoplasm of overlapping sites of esophagus: Secondary | ICD-10-CM | POA: Diagnosis not present

## 2021-10-25 DIAGNOSIS — Z95828 Presence of other vascular implants and grafts: Secondary | ICD-10-CM

## 2021-10-25 DIAGNOSIS — Z79899 Other long term (current) drug therapy: Secondary | ICD-10-CM | POA: Insufficient documentation

## 2021-10-25 DIAGNOSIS — Z5112 Encounter for antineoplastic immunotherapy: Secondary | ICD-10-CM | POA: Insufficient documentation

## 2021-10-25 LAB — CBC WITH DIFFERENTIAL (CANCER CENTER ONLY)
Abs Immature Granulocytes: 0.01 10*3/uL (ref 0.00–0.07)
Basophils Absolute: 0 10*3/uL (ref 0.0–0.1)
Basophils Relative: 1 %
Eosinophils Absolute: 0.2 10*3/uL (ref 0.0–0.5)
Eosinophils Relative: 4 %
HCT: 32.4 % — ABNORMAL LOW (ref 39.0–52.0)
Hemoglobin: 11.2 g/dL — ABNORMAL LOW (ref 13.0–17.0)
Immature Granulocytes: 0 %
Lymphocytes Relative: 14 %
Lymphs Abs: 0.6 10*3/uL — ABNORMAL LOW (ref 0.7–4.0)
MCH: 32.2 pg (ref 26.0–34.0)
MCHC: 34.6 g/dL (ref 30.0–36.0)
MCV: 93.1 fL (ref 80.0–100.0)
Monocytes Absolute: 0.3 10*3/uL (ref 0.1–1.0)
Monocytes Relative: 7 %
Neutro Abs: 3.2 10*3/uL (ref 1.7–7.7)
Neutrophils Relative %: 74 %
Platelet Count: 150 10*3/uL (ref 150–400)
RBC: 3.48 MIL/uL — ABNORMAL LOW (ref 4.22–5.81)
RDW: 13.3 % (ref 11.5–15.5)
WBC Count: 4.3 10*3/uL (ref 4.0–10.5)
nRBC: 0 % (ref 0.0–0.2)

## 2021-10-25 LAB — CMP (CANCER CENTER ONLY)
ALT: 12 U/L (ref 0–44)
AST: 16 U/L (ref 15–41)
Albumin: 3.7 g/dL (ref 3.5–5.0)
Alkaline Phosphatase: 87 U/L (ref 38–126)
Anion gap: 8 (ref 5–15)
BUN: 14 mg/dL (ref 8–23)
CO2: 24 mmol/L (ref 22–32)
Calcium: 8.6 mg/dL — ABNORMAL LOW (ref 8.9–10.3)
Chloride: 104 mmol/L (ref 98–111)
Creatinine: 0.66 mg/dL (ref 0.61–1.24)
GFR, Estimated: >60 mL/min (ref >60)
Glucose, Bld: 226 mg/dL — ABNORMAL HIGH (ref 70–99)
Potassium: 3.2 mmol/L — ABNORMAL LOW (ref 3.5–5.1)
Sodium: 136 mmol/L (ref 135–145)
Total Bilirubin: 0.5 mg/dL (ref 0.3–1.2)
Total Protein: 6.4 g/dL — ABNORMAL LOW (ref 6.5–8.1)

## 2021-10-25 LAB — TSH: TSH: 0.804 u[IU]/mL (ref 0.320–4.118)

## 2021-10-25 MED ORDER — SODIUM CHLORIDE 0.9% FLUSH
10.0000 mL | INTRAVENOUS | Status: DC | PRN
  Administered 2021-10-25: 10 mL

## 2021-10-25 MED ORDER — SODIUM CHLORIDE 0.9% FLUSH
10.0000 mL | Freq: Once | INTRAVENOUS | Status: AC
  Administered 2021-10-25: 10 mL

## 2021-10-25 MED ORDER — SODIUM CHLORIDE 0.9 % IV SOLN: Freq: Once | INTRAVENOUS | Status: AC

## 2021-10-25 MED ORDER — SODIUM CHLORIDE 0.9 % IV SOLN
240.0000 mg | Freq: Once | INTRAVENOUS | Status: AC
  Administered 2021-10-25: 240 mg via INTRAVENOUS
  Filled 2021-10-25: qty 24

## 2021-10-25 MED ORDER — HEPARIN SOD (PORK) LOCK FLUSH 100 UNIT/ML IV SOLN
500.0000 [IU] | Freq: Once | INTRAVENOUS | Status: AC | PRN
  Administered 2021-10-25: 500 [IU]

## 2021-10-25 NOTE — Research (Signed)
DCP-001: Use of a Clinical Trial Screening Tool to Address Cancer Health Disparities in the [facility] ([facility])  10/25/21  This patient declined participation in the S2013 study.  He was approached regarding this study today during infusion.  Patient declined participation.  He was thanked for his time and consideration.  Clabe Seal Clinical Research Coordinator I  10/25/21 2:06 PM

## 2021-10-25 NOTE — Patient Instructions (Signed)
[facility] MEDICAL [facility]COLOG[facility]  Discharge Instructions: ?Thank you for choosing [facility] to provide your oncology and hematology care.  ? ?If you have a lab appointment with the Cancer Center, please go directly to the Cancer Center and check in at the registration area. ?  ?Wear comfortable clothing and clothing appropriate for easy access to any Portacath or PICC line.  ? ?We strive to give you quality time with your provider. You may need to reschedule your appointment if you arrive late (15 or more minutes).  Arriving late affects you and other patients whose appointments are after yours.  Also, if you miss three or more appointments without notifying the office, you may be dismissed from the clinic at the provider?s discretion.    ?  ?For prescription refill requests, have your pharmacy contact our office and allow 72 hours for refills to be completed.   ? ?Today you received the following chemotherapy and/or immunotherapy agents Opdivo    ?  ?To help prevent nausea and vomiting after your treatment, we encourage you to take your nausea medication as directed. ? ?BELOW ARE SYMPTOMS THAT SHOULD BE REPORTED IMMEDIATELY: ?*FEVER GREATER THAN 100.4 F (38 ?C) OR HIGHER ?*CHILLS OR SWEATING ?*NAUSEA AND VOMITING THAT IS NOT CONTROLLED WITH YOUR NAUSEA MEDICATION ?*UNUSUAL SHORTNESS OF BREATH ?*UNUSUAL BRUISING OR BLEEDING ?*URINARY PROBLEMS (pain or burning when urinating, or frequent urination) ?*BOWEL PROBLEMS (unusual diarrhea, constipation, pain near the anus) ?TENDERNESS IN MOUTH AND THROAT WITH OR WITHOUT PRESENCE OF ULCERS (sore throat, sores in mouth, or a toothache) ?UNUSUAL RASH, SWELLING OR PAIN  ?UNUSUAL VAGINAL DISCHARGE OR ITCHING  ? ?Items with * indicate a potential emergency and should be followed up as soon as possible or go to the Emergency Department if any problems should occur. ? ?Please show the CHEMOTHERAPY ALERT CARD or IMMUNOTHERAPY ALERT CARD at check-in to the  Emergency Department and triage nurse. ? ?Should you have questions after your visit or need to cancel or reschedule your appointment, please contact [facility]  Dept: [phone]  and follow the prompts.  Office hours are 8:00 a.m. to 4:30 p.m. Monday - Friday. Please note that voicemails left after 4:00 p.m. may not be returned until the following business day.  We are closed weekends and major holidays. You have access to a nurse at all times for urgent questions. Please call the main number to the clinic Dept: [phone] and follow the prompts. ? ? ?For any non-urgent questions, you may also contact your provider using MyChart. We now offer e-Visits for anyone 18 and older to request care online for non-urgent symptoms. For details visit [email or web address]. ?  ?Also download the MyChart app! Go to the app store, search "MyChart", open the app, select [facility], and log in with your MyChart username and password. ? ?Due to Covid, a mask is required upon entering the hospital/clinic. If you do not have a mask, one will be given to you upon arrival. For doctor visits, patients may have 1 support person aged 18 or older with them. For treatment visits, patients cannot have anyone with them due to current Covid guidelines and our immunocompromised population.  ? ?

## 2021-10-26 ENCOUNTER — Encounter: Payer: Self-pay | Admitting: Hematology

## 2021-10-26 LAB — T4: T4, Total: 7.6 ug/dL (ref 4.5–12.0)

## 2021-10-28 ENCOUNTER — Ambulatory Visit (INDEPENDENT_AMBULATORY_CARE_PROVIDER_SITE_OTHER): Payer: Self-pay | Admitting: Thoracic Surgery (Cardiothoracic Vascular Surgery)

## 2021-10-28 ENCOUNTER — Encounter: Payer: Self-pay | Admitting: Hematology

## 2021-10-28 ENCOUNTER — Ambulatory Visit
Admission: RE | Admit: 2021-10-28 | Discharge: 2021-10-28 | Disposition: A | Discharge status: Home / Self Care | Payer: BC Managed Care – PPO | Source: Ambulatory Visit | Attending: Thoracic Surgery (Cardiothoracic Vascular Surgery) | Admitting: Thoracic Surgery (Cardiothoracic Vascular Surgery)

## 2021-10-28 ENCOUNTER — Other Ambulatory Visit: Payer: Self-pay

## 2021-10-28 VITALS — BP 134/86 | HR 70 | Resp 20 | Ht 66.0 in | Wt 140.0 lb

## 2021-10-28 DIAGNOSIS — C159 Malignant neoplasm of esophagus, unspecified: Secondary | ICD-10-CM

## 2021-10-28 DIAGNOSIS — J984 Other disorders of lung: Secondary | ICD-10-CM | POA: Diagnosis not present

## 2021-10-28 DIAGNOSIS — R918 Other nonspecific abnormal finding of lung field: Secondary | ICD-10-CM | POA: Diagnosis not present

## 2021-10-28 DIAGNOSIS — C158 Malignant neoplasm of overlapping sites of esophagus: Secondary | ICD-10-CM

## 2021-10-28 DIAGNOSIS — Z9049 Acquired absence of other specified parts of digestive tract: Secondary | ICD-10-CM

## 2021-10-28 DIAGNOSIS — Z9889 Other specified postprocedural states: Secondary | ICD-10-CM

## 2021-10-28 NOTE — Progress Notes (Signed)
° °   °  [address]       [place] [address]             [phone]        [place] [facility] Medical Record #[id number] Date of Birth: [birth year]  Referring: Arta Silence, MD Primary Care: Cyndi Bender, PA-C Primary Cardiologist:None  Reason for visit:   follow-up  History of Present Illness:     Nathaniel Jennings presents for his 1 month follow-up appointment.  He is doing well.  He denies any pain.  He has gone back to work.  He denies any dysphagia or reflux.  Physical Exam: BP 134/86    Pulse 70    Resp 20    Ht 5\' 6"  (1.676 m)    Wt 140 lb (63.5 kg)    SpO2 98%    BMI 22.60 kg/m   Alert NAD Incision clean.  Abdomen soft, ND No peripheral edema   Diagnostic Studies & Laboratory data: CXR: Clear     Assessment / Plan:   66 year old male status post robotic assisted Ivor Lewis esophagectomy in 2021.  He is currently doing well.  He is cleared to resume all activities.  He will follow-up with medical oncology.  He will follow-up with [place] as needed.   Lajuana Matte 10/28/2021 8:49 PM

## 2021-10-30 DIAGNOSIS — G4730 Sleep apnea, unspecified: Secondary | ICD-10-CM | POA: Diagnosis not present

## 2021-11-07 ENCOUNTER — Inpatient Hospital Stay: Payer: BC Managed Care – PPO

## 2021-11-07 ENCOUNTER — Inpatient Hospital Stay: Payer: BC Managed Care – PPO | Admitting: Hematology

## 2021-11-07 ENCOUNTER — Encounter: Payer: Self-pay | Admitting: Hematology

## 2021-11-07 ENCOUNTER — Other Ambulatory Visit: Payer: Self-pay

## 2021-11-07 VITALS — BP 130/78 | HR 75 | Temp 98.5°F | Resp 17 | Ht 66.0 in | Wt 140.6 lb

## 2021-11-07 DIAGNOSIS — C158 Malignant neoplasm of overlapping sites of esophagus: Secondary | ICD-10-CM

## 2021-11-07 DIAGNOSIS — Z5112 Encounter for antineoplastic immunotherapy: Secondary | ICD-10-CM | POA: Diagnosis not present

## 2021-11-07 DIAGNOSIS — Z79899 Other long term (current) drug therapy: Secondary | ICD-10-CM | POA: Diagnosis not present

## 2021-11-07 DIAGNOSIS — Z95828 Presence of other vascular implants and grafts: Secondary | ICD-10-CM

## 2021-11-07 LAB — CBC WITH DIFFERENTIAL (CANCER CENTER ONLY)
Abs Immature Granulocytes: 0.01 10*3/uL (ref 0.00–0.07)
Basophils Absolute: 0.1 10*3/uL (ref 0.0–0.1)
Basophils Relative: 1 %
Eosinophils Absolute: 0.1 10*3/uL (ref 0.0–0.5)
Eosinophils Relative: 3 %
HCT: 33.4 % — ABNORMAL LOW (ref 39.0–52.0)
Hemoglobin: 11.4 g/dL — ABNORMAL LOW (ref 13.0–17.0)
Immature Granulocytes: 0 %
Lymphocytes Relative: 14 %
Lymphs Abs: 0.6 10*3/uL — ABNORMAL LOW (ref 0.7–4.0)
MCH: 31.8 pg (ref 26.0–34.0)
MCHC: 34.1 g/dL (ref 30.0–36.0)
MCV: 93 fL (ref 80.0–100.0)
Monocytes Absolute: 0.4 10*3/uL (ref 0.1–1.0)
Monocytes Relative: 10 %
Neutro Abs: 3.3 10*3/uL (ref 1.7–7.7)
Neutrophils Relative %: 72 %
Platelet Count: 136 10*3/uL — ABNORMAL LOW (ref 150–400)
RBC: 3.59 MIL/uL — ABNORMAL LOW (ref 4.22–5.81)
RDW: 13.4 % (ref 11.5–15.5)
WBC Count: 4.6 10*3/uL (ref 4.0–10.5)
nRBC: 0 % (ref 0.0–0.2)

## 2021-11-07 LAB — CMP (CANCER CENTER ONLY)
ALT: 11 U/L (ref 0–44)
AST: 15 U/L (ref 15–41)
Albumin: 3.8 g/dL (ref 3.5–5.0)
Alkaline Phosphatase: 78 U/L (ref 38–126)
Anion gap: 5 (ref 5–15)
BUN: 14 mg/dL (ref 8–23)
CO2: 28 mmol/L (ref 22–32)
Calcium: 9.1 mg/dL (ref 8.9–10.3)
Chloride: 107 mmol/L (ref 98–111)
Creatinine: 0.65 mg/dL (ref 0.61–1.24)
GFR, Estimated: >60 mL/min (ref >60)
Glucose, Bld: 89 mg/dL (ref 70–99)
Potassium: 3.7 mmol/L (ref 3.5–5.1)
Sodium: 140 mmol/L (ref 135–145)
Total Bilirubin: 0.5 mg/dL (ref 0.3–1.2)
Total Protein: 6.6 g/dL (ref 6.5–8.1)

## 2021-11-07 LAB — TSH: TSH: 1.001 u[IU]/mL (ref 0.320–4.118)

## 2021-11-07 MED ORDER — HEPARIN SOD (PORK) LOCK FLUSH 100 UNIT/ML IV SOLN
500.0000 [IU] | Freq: Once | INTRAVENOUS | Status: AC | PRN
  Administered 2021-11-07: 500 [IU]

## 2021-11-07 MED ORDER — SODIUM CHLORIDE 0.9% FLUSH
10.0000 mL | Freq: Once | INTRAVENOUS | Status: AC
  Administered 2021-11-07: 10 mL

## 2021-11-07 MED ORDER — SODIUM CHLORIDE 0.9 % IV SOLN: Freq: Once | INTRAVENOUS | Status: AC

## 2021-11-07 MED ORDER — SODIUM CHLORIDE 0.9% FLUSH
10.0000 mL | INTRAVENOUS | Status: DC | PRN
  Administered 2021-11-07: 10 mL

## 2021-11-07 MED ORDER — SODIUM CHLORIDE 0.9 % IV SOLN
240.0000 mg | Freq: Once | INTRAVENOUS | Status: AC
  Administered 2021-11-07: 240 mg via INTRAVENOUS
  Filled 2021-11-07: qty 24

## 2021-11-07 NOTE — Patient Instructions (Signed)
[facility]  ? Discharge Instructions: ?Thank you for choosing [facility] to provide your oncology and hematology care.  ? ?If you have a lab appointment with the Cancer Center, please go directly to the Cancer Center and check in at the registration area. ?  ?Wear comfortable clothing and clothing appropriate for easy access to any Portacath or PICC line.  ? ?We strive to give you quality time with your provider. You may need to reschedule your appointment if you arrive late (15 or more minutes).  Arriving late affects you and other patients whose appointments are after yours.  Also, if you miss three or more appointments without notifying the office, you may be dismissed from the clinic at the provider?s discretion.    ?  ?For prescription refill requests, have your pharmacy contact our office and allow 72 hours for refills to be completed.   ? ?Today you received the following chemotherapy and/or immunotherapy agents: nivolumab    ?  ?To help prevent nausea and vomiting after your treatment, we encourage you to take your nausea medication as directed. ? ?BELOW ARE SYMPTOMS THAT SHOULD BE REPORTED IMMEDIATELY: ?*FEVER GREATER THAN 100.4 F (38 ?C) OR HIGHER ?*CHILLS OR SWEATING ?*NAUSEA AND VOMITING THAT IS NOT CONTROLLED WITH YOUR NAUSEA MEDICATION ?*UNUSUAL SHORTNESS OF BREATH ?*UNUSUAL BRUISING OR BLEEDING ?*URINARY PROBLEMS (pain or burning when urinating, or frequent urination) ?*BOWEL PROBLEMS (unusual diarrhea, constipation, pain near the anus) ?TENDERNESS IN MOUTH AND THROAT WITH OR WITHOUT PRESENCE OF ULCERS (sore throat, sores in mouth, or a toothache) ?UNUSUAL RASH, SWELLING OR PAIN  ?UNUSUAL VAGINAL DISCHARGE OR ITCHING  ? ?Items with * indicate a potential emergency and should be followed up as soon as possible or go to the Emergency Department if any problems should occur. ? ?Please show the CHEMOTHERAPY ALERT CARD or IMMUNOTHERAPY ALERT CARD at check-in  to the Emergency Department and triage nurse. ? ?Should you have questions after your visit or need to cancel or reschedule your appointment, please contact [facility]  Dept: [phone]  and follow the prompts.  Office hours are 8:00 a.m. to 4:30 p.m. Monday - Friday. Please note that voicemails left after 4:00 p.m. may not be returned until the following business day.  We are closed weekends and major holidays. You have access to a nurse at all times for urgent questions. Please call the main number to the clinic Dept: [phone] and follow the prompts. ? ? ?For any non-urgent questions, you may also contact your provider using MyChart. We now offer e-Visits for anyone 18 and older to request care online for non-urgent symptoms. For details visit [email or web address]. ?  ?Also download the MyChart app! Go to the app store, search "MyChart", open the app, select [facility], and log in with your MyChart username and password. ? ?Due to Covid, a mask is required upon entering the hospital/clinic. If you do not have a mask, one will be given to you upon arrival. For doctor visits, patients may have 1 support person aged 18 or older with them. For treatment visits, patients cannot have anyone with them due to current Covid guidelines and our immunocompromised population.  ? ?

## 2021-11-07 NOTE — Progress Notes (Signed)
[facility]   [phone] [phone]   Clinic Follow up Note   Patient Care Team: [name] as PCP - General (Physician Assistant) [name], MD as Consulting Physician (Oncology)  Date of Service:  11/07/2021  CHIEF COMPLAINT: f/u of esophageal cancer  CURRENT THERAPY:  Adjuvant nivolumab, q2weeks, starting 10/10/21  ASSESSMENT & PLAN:  Nathaniel Jennings is a 66 y.o. male with   1. 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 -EGD performed by Dr. Alessandra Bevels on 04/19/21 showed a partially obstructing, likely malignant, esophageal tumor in the distal esophagus and a polyp in the mid esophagus. Biopsy of both confirmed invasive moderately differentiated adenocarcinoma of the esophagus, Her2 negative. -PET scan 05/16/21 showed: 6.2 cm distal esophageal mass; hypermetabolic upper right paratracheal lymph node. No other distant metastasis.  -EBUS biopsy on 05/26/21, path from 2R lymph node confirmed malignant cells consistent with his primary esophageal cancer. This node is not resectable by surgery  -He began concurrent chemoRT with weekly TC on 05/24/21. He completed radiation therapy on 07/04/21. -We changed his chemo to FOLFOX on 06/20/21, at reduced dose for first cycle due to mild neutropenia and concurrent RT. He completed 4 cycles on 08/08/21. -he underwent esophagogastrectomy on 09/05/21 under Dr. Kipp Brood. Pathology showed invasive poorly differentiated adenocarcinoma, rare single tumor cells present.  Margins are negative.  Metastatic carcinoma involved 5 out of a total of 14 lymph nodes (5/14).  -he began adjuvant nivolumab on 10/10/21. He is tolerating well overall. -labs reviewed, adequate to proceed with treatment. Plan to change to every 4 weeks from cycle 5  -he reports worsening eczema since he  started nivolumab, likely treatment related.  He is using Kenalog cream, I encouraged him to contact his dermatologist for nonsteroid medications.   2. Hypermetabolic thyroid nodule -seen on PET 05/16/21 (and not on head/neck US on 04/18/21) -repeat head/neck US on 06/03/21 showed a 1.1 cm inferior right nodule, corresponding to hypermetabolic focus on PET.  -Biopsy on 08/16/21 showed scant follicular epithelium.   3. HTN and BPH -continue medication, will monitor his blood pressure closely during treatment.     PLAN: -proceed with nivolumab today -lab, flush, f/u, and nivolumab in 2, 4 weeks    No problem-specific Assessment & Plan notes found for this encounter.   SUMMARY OF ONCOLOGIC HISTORY: Oncology History Overview Note   Cancer Staging  Malignant neoplasm of overlapping sites of esophagus [facility]) Staging form: Esophagus - Adenocarcinoma, AJCC 8th Edition - Clinical stage from 05/13/2021: Stage IVA (cT3, cN2, cM0) - Signed by [name], MD on 05/22/2021    Malignant neoplasm of overlapping sites of esophagus ([place])  04/18/2021 Imaging   ULTRASOUND OF HEAD/NECK SOFT TISSUES  IMPRESSION: No suspicious lymphadenopathy   04/19/2021 Procedure   EGD  Impression: - Esophageal polyp(s) were found. Biopsied. - Partially obstructing, likely malignant esophageal tumor was found in the distal esophagus. - Small hiatal hernia. - A single gastric polyp. Biopsied. - Normal duodenal bulb, first portion of the duodenum and second portion of the duodenum.   04/19/2021 Pathology Results   FINAL MICROSCOPIC DIAGNOSIS: Stomach, Biopsy - FUNDIC GLAND POLYP. Negative for dysplasia. NO Helicobacter pylori organisms seen on H and E stain. Esophagus- Distal, Biopsy - INVASIVE MODERATELY DIFFERENTIATED ADENOCARCINOMA OF THE ESOPHAGUS.  - HER2 Study by Immunostain: Negative (score 1+) Esophagus- Mid, Biopsy - INVASIVE MODERATELY DIFFERENTIATED  ADENOCARCINOMA OF THE ESOPHAGUS. - HER2 Study by  Immunostain: Negative (score 1+)   05/05/2021 Imaging   CT CAP  IMPRESSION: 1. The patient's known esophageal mass is not well visualized. There is mild irregular wall thickening of the distal esophagus. Correlate with previous clinical evaluation. 2. Indeterminate mildly enlarged high right paratracheal node near the thoracic inlet. No other mediastinal adenopathy. 3. No evidence of metastatic disease in the abdomen or pelvis. 4. Stable small pulmonary nodules bilaterally consistent with benign findings. 5. Cholelithiasis, small renal cysts and Aortic Atherosclerosis (ICD10-I70.0).   05/09/2021 Initial Diagnosis   Malignant neoplasm of overlapping sites of esophagus ([place])   05/11/2021 Procedure   Upper EUS  Impression: - Mucosal nodule found in the esophagus. - Partially obstructing, malignant esophageal tumor was found at the gastroesophageal junction. - At least 3 discrete peritumoral lymph nodes - A mass was found in the gastroesophageal junction. A tissue diagnosis was obtained prior to this exam. This is of adenocarcinoma. This was staged T3 N2 Mx by endosonographic criteria. - Unable to traverse GE junction with either EGD scope or radial EUS scope.   05/13/2021 Cancer Staging   Staging form: Esophagus - Adenocarcinoma, AJCC 8th Edition - Clinical stage from 05/13/2021: Stage IVA (cT3, cN2, cM0) - Signed by [name], MD on 05/22/2021    05/16/2021 PET scan   IMPRESSION: 1. Distal esophageal mass measuring about 6.2 cm in length, maximum SUV 13.1. 2. Mildly hypermetabolic and mildly enlarged upper right paratracheal lymph node, 1.3 cm in short axis with maximum SUV 3.6, concerning for malignant involvement. 3. Hypodense 1.1 cm right thyroid nodule has a maximum SUV substantially above that of the background thyroid. A significant minority of such nodules can harbor thyroid cancer. Recommend thyroid US and biopsy (ref: J Am Coll Radiol. 2015 Feb;12(2): 143-50). 4. 3 by 4 mm  right lower lobe pulmonary nodule is similar to 05/05/2021, not hypermetabolic but below sensitive PET-CT size thresholds. Surveillance recommended. 5. Other imaging findings of potential clinical significance: Chronic left maxillary sinusitis. Aortic Atherosclerosis (ICD10-I70.0). Cholelithiasis. Prostatomegaly.   05/24/2021 - 06/13/2021 Chemotherapy          05/26/2021 Pathology Results   FINAL MICROSCOPIC DIAGNOSIS:   A. LYMPH NODE, 2R, FINE NEEDLE ASPIRATION:  - Malignant cells consistent with non-small cell carcinoma   COMMENT:  The cells are positive for cytokeratin 20 and CDX-2 (patchy). TTF-1, NapsinA, cytokeratin 5/6, p40, S100, MelanA, and cytokeratin 7 are negative. The patient's history of esophageal adenocarcinoma is noted and CDX-2 positivity is consistent with a gastrointestinal primary. This cytokeratin7/20 profile is usually seen in lower tract tumors, but this pattern can be seen in gastric/esophageal cases.   05/27/2021 - 07/04/2021 Radiation Therapy   Per Dr. Isidore Moos   06/20/2021 - 08/08/2021 Chemotherapy   Patient is on Treatment Plan : GASTROESOPHAGEAL FOLFOX q14d x 6 cycles     08/04/2021 PET scan   IMPRESSION: 1. Interval development of a new 6 mm short axis right cervical node with hypermetabolism. Finding is somewhat indeterminate given the apparent response of the hypermetabolic right paratracheal lymphadenopathy seen on the previous study. Close follow-up recommended as metastatic disease not excluded. 2. Diffuse hypermetabolic FDG accumulation noted in the esophagus today with more diffuse circumferential esophageal wall thickening on today's study than on the previous exam. Hypermetabolism in the distal esophagus at the level of the hypermetabolic neoplasm previously has decreased from SUV max = 13 to SUV max = 7 today. 3. Stable tiny 3-4 mm right lower lobe and  left upper lobe pulmonary nodules. Continued attention on follow-up recommended. 4.  Cholelithiasis. 5. Emphysema.  (ICD10-J43.9) 6.  Aortic Atherosclerois (ICD10-170.0)   08/17/2021 Pathology Results   FINAL MICROSCOPIC DIAGNOSIS:  A. LYMPH NODE, RIGHT CERVICAL, FINE NEEDLE ASPIRATION:  - No malignant cells identified  - See comment   COMMENT:  There is scant cellularity and the specimen may not be representative.    09/05/2021 Definitive Surgery   FINAL MICROSCOPIC DIAGNOSIS:   A. ESOPHAGOGASTRECTOMY:  - Invasive poorly differentiated adenocarcinoma.  - Metastatic carcinoma involving 5 of 13 lymph nodes (5/13).  - See oncology table below.   B. LYMPH NODE, LEVEL 8, EXCISION:  - One lymph node, negative for malignancy (0/1).   10/10/2021 -  Chemotherapy   Patient is on Treatment Plan : GASTROESOPHAGEAL Nivolumab q14d x 8 cycles / Nivolumab q28d        INTERVAL HISTORY:  Nathaniel Jennings is here for a follow up of esophageal cancer. He was last seen by me on 10/10/21. He presents to the clinic accompanied by his wife.   All other systems were reviewed with the patient and are negative.  MEDICAL HISTORY:  Past Medical History:  Diagnosis Date   Arthritis    BPH (benign prostatic hyperplasia)    Dyslipidemia    Elevated PSA    Esophageal cancer ([place])    Full dentures    GERD (gastroesophageal reflux disease)    History of melanoma excision    07/ 2011  s/p  wide excision left back--- per pathology-- negative maligant --  mild melanocytic atypia   History of palpitations    cardiologist --  dr Shelva Majestic (last note in epic 2014   Hypertension    Sleep apnea     SURGICAL HISTORY: Past Surgical History:  Procedure Laterality Date   COLONOSCOPY  10/24/2007   ESOPHAGOGASTRODUODENOSCOPY N/A 09/05/2021   Procedure: ESOPHAGOGASTRODUODENOSCOPY (EGD);  Surgeon: Lajuana Matte, MD;  Location: [facility] OR;  Service: Thoracic;  Laterality: N/A;   ESOPHAGOGASTRODUODENOSCOPY (EGD) WITH PROPOFOL N/A 05/11/2021   Procedure: ESOPHAGOGASTRODUODENOSCOPY (EGD)  WITH PROPOFOL;  Surgeon: Arta Silence, MD;  Location: [facility] ENDOSCOPY;  Service: Endoscopy;  Laterality: N/A;   EUS N/A 05/11/2021   Procedure: ESOPHAGEAL ENDOSCOPIC ULTRASOUND (EUS) RADIAL;  Surgeon: Arta Silence, MD;  Location: [facility] ENDOSCOPY;  Service: Endoscopy;  Laterality: N/A;   EYE SURGERY     Lasik surgery on both eyes, but still needs glasses   FINE NEEDLE ASPIRATION  05/26/2021   Procedure: FINE NEEDLE ASPIRATION (FNA) LINEAR;  Surgeon: Garner Nash, DO;  Location: [facility];  Service: Pulmonary;;   INTERCOSTAL NERVE BLOCK  09/05/2021   Procedure: INTERCOSTAL NERVE BLOCK;  Surgeon: Lajuana Matte, MD;  Location: [facility];  Service: Thoracic;;   IR IMAGING GUIDED PORT INSERTION  06/30/2021   JEJUNOSTOMY  09/05/2021   Procedure: Shanon Rosser;  Surgeon: Lajuana Matte, MD;  Location: [facility];  Service: Thoracic;;   PROSTATE BIOPSY N/A 07/02/2015   Procedure: BIOPSY TRANSRECTAL ULTRASONIC PROSTATE (TUBP);  Surgeon: Lowella Bandy, MD;  Location: [facility];  Service: Urology;  Laterality: N/A;   PROSTATE BIOPSY N/A 11/22/2015   Procedure: BIOPSY TRANSRECTAL ULTRASONIC PROSTATE (TUBP);  Surgeon: Cleon Gustin, MD;  Location: [facility];  Service: Urology;  Laterality: N/A;   TRANSTHORACIC ECHOCARDIOGRAM  11/19/2012   normal echo/  trivial TR   VIDEO BRONCHOSCOPY WITH ENDOBRONCHIAL ULTRASOUND N/A 05/26/2021   Procedure: VIDEO BRONCHOSCOPY WITH ENDOBRONCHIAL ULTRASOUND;  Surgeon: June Leap  L, DO;  Location: [facility];  Service: Pulmonary;  Laterality: N/A;   WIDE EXCISION MELANOMA LEFT BACK  05/02/2010    I have reviewed the social history and family history with the patient and they are unchanged from previous note.  ALLERGIES:  is allergic to bee venom.  MEDICATIONS:  Current Outpatient Medications  Medication Sig Dispense Refill   aspirin EC 81 MG tablet Take 81 mg by mouth in the morning.     lidocaine-prilocaine (EMLA) cream  Apply 1 application topically as needed. 30 g 0   metoprolol succinate (TOPROL-XL) 50 MG 24 hr tablet Take 50 mg by mouth daily. Take with or immediately following a meal.     pantoprazole (PROTONIX) 40 MG tablet Take 1 tablet (40 mg total) by mouth 2 (two) times daily. 30 tablet 1   triamcinolone cream (KENALOG) 0.1 % Apply 1 application topically 2 (two) times daily as needed (eczema).     No current facility-administered medications for this visit.    PHYSICAL EXAMINATION: ECOG PERFORMANCE STATUS: 1 - Symptomatic but completely ambulatory  Vitals:   11/07/21 1246  BP: 130/78  Pulse: 75  Resp: 17  Temp: 98.5 F (36.9 C)  SpO2: 99%   Wt Readings from Last 3 Encounters:  11/07/21 140 lb 9.6 oz (63.8 kg)  10/28/21 140 lb (63.5 kg)  10/25/21 142 lb (64.4 kg)     GENERAL:alert, no distress and comfortable SKIN: skin color normal, no rashes or significant lesions except diffuse skin erythema in the left lower extremity. EYES: normal, Conjunctiva are pink and non-injected, sclera clear  NEURO: alert & oriented x 3 with fluent speech  LABORATORY DATA:  I have reviewed the data as listed CBC Latest Ref Rng & Units 11/07/2021 10/25/2021 10/10/2021  WBC 4.0 - 10.5 K/uL 4.6 4.3 4.5  Hemoglobin 13.0 - 17.0 g/dL 11.4(L) 11.2(L) 10.7(L)  Hematocrit 39.0 - 52.0 % 33.4(L) 32.4(L) 30.6(L)  Platelets 150 - 400 K/uL 136(L) 150 167     CMP Latest Ref Rng & Units 11/07/2021 10/25/2021 10/10/2021  Glucose 70 - 99 mg/dL 89 226(H) 111(H)  BUN 8 - 23 mg/dL _0 Creatinine 0.61 - 1.24 mg/dL 0.65 0.66 0.75  Sodium 135 - 145 mmol/L 140 136 142  Potassium 3.5 - 5.1 mmol/L 3.7 3.2(L) 3.3(L)  Chloride 98 - 111 mmol/L 107 104 108  CO2 22 - 32 mmol/L _1 Calcium 8.9 - 10.3 mg/dL 9.1 8.6(L) 8.9  Total Protein 6.5 - 8.1 g/dL 6.6 6.4(L) 6.8  Total Bilirubin 0.3 - 1.2 mg/dL 0.5 0.5 0.4  Alkaline Phos 38 - 126 U/L 78 87 90  AST 15 - 41 U/L _2 ALT 0 - 44 U/L _3 RADIOGRAPHIC STUDIES: I have personally reviewed the radiological images as listed and agreed with the findings in the report. No results found.    No orders of the defined types were placed in this encounter.  All questions were answered. The patient knows to call the clinic with any problems, questions or concerns. No barriers to learning was detected. The total time spent in the appointment was 30 minutes.     [name], MD 11/07/2021   I, Wilburn Mylar, am acting as scribe for [name], MD.   I have reviewed the above documentation for accuracy and completeness, and I agree with the above.

## 2021-11-08 LAB — T4: T4, Total: 8.4 ug/dL (ref 4.5–12.0)

## 2021-11-21 ENCOUNTER — Other Ambulatory Visit: Payer: Self-pay

## 2021-11-21 ENCOUNTER — Inpatient Hospital Stay: Payer: BC Managed Care – PPO

## 2021-11-21 ENCOUNTER — Inpatient Hospital Stay: Payer: BC Managed Care – PPO | Admitting: Hematology

## 2021-11-21 VITALS — BP 139/81 | HR 72 | Temp 97.8°F | Resp 18 | Ht 66.0 in | Wt 144.0 lb

## 2021-11-21 DIAGNOSIS — C158 Malignant neoplasm of overlapping sites of esophagus: Secondary | ICD-10-CM | POA: Diagnosis not present

## 2021-11-21 DIAGNOSIS — Z79899 Other long term (current) drug therapy: Secondary | ICD-10-CM | POA: Diagnosis not present

## 2021-11-21 DIAGNOSIS — Z95828 Presence of other vascular implants and grafts: Secondary | ICD-10-CM

## 2021-11-21 DIAGNOSIS — Z5112 Encounter for antineoplastic immunotherapy: Secondary | ICD-10-CM | POA: Diagnosis not present

## 2021-11-21 LAB — CBC WITH DIFFERENTIAL (CANCER CENTER ONLY)
Abs Immature Granulocytes: 0.01 10*3/uL (ref 0.00–0.07)
Basophils Absolute: 0 10*3/uL (ref 0.0–0.1)
Basophils Relative: 1 %
Eosinophils Absolute: 0.2 10*3/uL (ref 0.0–0.5)
Eosinophils Relative: 4 %
HCT: 36 % — ABNORMAL LOW (ref 39.0–52.0)
Hemoglobin: 12.1 g/dL — ABNORMAL LOW (ref 13.0–17.0)
Immature Granulocytes: 0 %
Lymphocytes Relative: 16 %
Lymphs Abs: 0.6 10*3/uL — ABNORMAL LOW (ref 0.7–4.0)
MCH: 31.5 pg (ref 26.0–34.0)
MCHC: 33.6 g/dL (ref 30.0–36.0)
MCV: 93.8 fL (ref 80.0–100.0)
Monocytes Absolute: 0.4 10*3/uL (ref 0.1–1.0)
Monocytes Relative: 11 %
Neutro Abs: 2.6 10*3/uL (ref 1.7–7.7)
Neutrophils Relative %: 68 %
Platelet Count: 126 10*3/uL — ABNORMAL LOW (ref 150–400)
RBC: 3.84 MIL/uL — ABNORMAL LOW (ref 4.22–5.81)
RDW: 13.6 % (ref 11.5–15.5)
WBC Count: 3.9 10*3/uL — ABNORMAL LOW (ref 4.0–10.5)
nRBC: 0 % (ref 0.0–0.2)

## 2021-11-21 LAB — CMP (CANCER CENTER ONLY)
ALT: 13 U/L (ref 0–44)
AST: 16 U/L (ref 15–41)
Albumin: 3.9 g/dL (ref 3.5–5.0)
Alkaline Phosphatase: 82 U/L (ref 38–126)
Anion gap: 5 (ref 5–15)
BUN: 12 mg/dL (ref 8–23)
CO2: 27 mmol/L (ref 22–32)
Calcium: 8.9 mg/dL (ref 8.9–10.3)
Chloride: 106 mmol/L (ref 98–111)
Creatinine: 0.66 mg/dL (ref 0.61–1.24)
GFR, Estimated: >60 mL/min (ref >60)
Glucose, Bld: 108 mg/dL — ABNORMAL HIGH (ref 70–99)
Potassium: 3.7 mmol/L (ref 3.5–5.1)
Sodium: 138 mmol/L (ref 135–145)
Total Bilirubin: 0.5 mg/dL (ref 0.3–1.2)
Total Protein: 6.6 g/dL (ref 6.5–8.1)

## 2021-11-21 LAB — TSH: TSH: 1.026 u[IU]/mL (ref 0.320–4.118)

## 2021-11-21 MED ORDER — SODIUM CHLORIDE 0.9% FLUSH
10.0000 mL | Freq: Once | INTRAVENOUS | Status: AC
  Administered 2021-11-21: 10 mL

## 2021-11-21 MED ORDER — SODIUM CHLORIDE 0.9 % IV SOLN: Freq: Once | INTRAVENOUS | Status: AC

## 2021-11-21 MED ORDER — SODIUM CHLORIDE 0.9% FLUSH
10.0000 mL | INTRAVENOUS | Status: DC | PRN
  Administered 2021-11-21: 10 mL

## 2021-11-21 MED ORDER — SODIUM CHLORIDE 0.9 % IV SOLN
240.0000 mg | Freq: Once | INTRAVENOUS | Status: AC
  Administered 2021-11-21: 240 mg via INTRAVENOUS
  Filled 2021-11-21: qty 24

## 2021-11-21 MED ORDER — HEPARIN SOD (PORK) LOCK FLUSH 100 UNIT/ML IV SOLN
500.0000 [IU] | Freq: Once | INTRAVENOUS | Status: AC | PRN
  Administered 2021-11-21: 500 [IU]

## 2021-11-21 NOTE — Patient Instructions (Signed)
[facility]  Discharge Instructions: °Thank you for choosing [facility] to provide your oncology and hematology care.  ° °If you have a lab appointment with the Cancer Center, please go directly to the Cancer Center and check in at the registration area. °  °Wear comfortable clothing and clothing appropriate for easy access to any Portacath or PICC line.  ° °We strive to give you quality time with your provider. You may need to reschedule your appointment if you arrive late (15 or more minutes).  Arriving late affects you and other patients whose appointments are after yours.  Also, if you miss three or more appointments without notifying the office, you may be dismissed from the clinic at the provider’s discretion.    °  °For prescription refill requests, have your pharmacy contact our office and allow 72 hours for refills to be completed.   ° °Today you received the following chemotherapy and/or immunotherapy agent: Nivolumab (Opdivo) °  °To help prevent nausea and vomiting after your treatment, we encourage you to take your nausea medication as directed. ° °BELOW ARE SYMPTOMS THAT SHOULD BE REPORTED IMMEDIATELY: °*FEVER GREATER THAN 100.4 F (38 °C) OR HIGHER °*CHILLS OR SWEATING °*NAUSEA AND VOMITING THAT IS NOT CONTROLLED WITH YOUR NAUSEA MEDICATION °*UNUSUAL SHORTNESS OF BREATH °*UNUSUAL BRUISING OR BLEEDING °*URINARY PROBLEMS (pain or burning when urinating, or frequent urination) °*BOWEL PROBLEMS (unusual diarrhea, constipation, pain near the anus) °TENDERNESS IN MOUTH AND THROAT WITH OR WITHOUT PRESENCE OF ULCERS (sore throat, sores in mouth, or a toothache) °UNUSUAL RASH, SWELLING OR PAIN  °UNUSUAL VAGINAL DISCHARGE OR ITCHING  ° °Items with * indicate a potential emergency and should be followed up as soon as possible or go to the Emergency Department if any problems should occur. ° °Please show the CHEMOTHERAPY ALERT CARD or IMMUNOTHERAPY ALERT CARD at  check-in to the Emergency Department and triage nurse. ° °Should you have questions after your visit or need to cancel or reschedule your appointment, please contact [facility]  Dept: [phone]  and follow the prompts.  Office hours are 8:00 a.m. to 4:30 p.m. Monday - Friday. Please note that voicemails left after 4:00 p.m. may not be returned until the following business day.  We are closed weekends and major holidays. You have access to a nurse at all times for urgent questions. Please call the main number to the clinic Dept: [phone] and follow the prompts. ° ° °For any non-urgent questions, you may also contact your provider using MyChart. We now offer e-Visits for anyone 18 and older to request care online for non-urgent symptoms. For details visit [email or web address]. °  °Also download the MyChart app! Go to the app store, search "MyChart", open the app, select [facility], and log in with your MyChart username and password. ° °Due to Covid, a mask is required upon entering the hospital/clinic. If you do not have a mask, one will be given to you upon arrival. For doctor visits, patients may have 1 support person aged 18 or older with them. For treatment visits, patients cannot have anyone with them due to current Covid guidelines and our immunocompromised population.  ° °

## 2021-11-21 NOTE — Progress Notes (Signed)
[facility]   Telephone:(336) [phone] Fax:(336) [phone]   Clinic Follow up Note   Patient Care Team: [name] as PCP - General (Physician Assistant) [name], MD as Consulting Physician (Oncology)  Date of Service:  11/21/2021  CHIEF COMPLAINT: f/u of esophageal cancer  CURRENT THERAPY:  Adjuvant nivolumab, q2weeks, starting 10/10/21  ASSESSMENT & PLAN:  Nathaniel Jennings is a 66 y.o. male with   1. 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 for dysphasia with solid food and regurgitation. -EGD performed by Dr. Alessandra Bevels on 04/19/21 showed a partially obstructing, likely malignant, esophageal tumor in the distal esophagus and a polyp in the mid esophagus. Biopsy of both confirmed invasive moderately differentiated adenocarcinoma of the esophagus, Her2 negative. -PET scan 05/16/21 showed: 6.2 cm distal esophageal mass; hypermetabolic upper right paratracheal lymph node. No other distant metastasis.  -EBUS biopsy on 05/26/21, path from 2R lymph node confirmed malignant cells consistent with his primary esophageal cancer. This node is not resectable by surgery  -He began concurrent chemoRT with weekly TC on 05/24/21. He completed radiation therapy on 07/04/21. -We changed his chemo to FOLFOX on 06/20/21, at reduced dose for first cycle due to mild neutropenia and concurrent RT. He completed 4 cycles on 08/08/21. -he underwent esophagogastrectomy on 09/05/21 under Dr. Kipp Brood. Pathology showed invasive poorly differentiated adenocarcinoma, rare single tumor cells present.  Margins are negative.  Metastatic carcinoma involved 5 out of a total of 14 lymph nodes (5/14).  -he began adjuvant nivolumab on 10/10/21. He is tolerating well overall. -labs reviewed, adequate to proceed with treatment. Plan to change to every 4 weeks from cycle 5  -He is tolerating treatment well,  except mildly flared eczema, he is using Eucerin now.  He will follow-up with his dermatologist.   2. Hypermetabolic thyroid nodule -seen on PET 05/16/21 (and not on head/neck US on 04/18/21) -repeat head/neck US on 06/03/21 showed a 1.1 cm inferior right nodule, corresponding to hypermetabolic focus on PET.  -Biopsy on 08/16/21 showed scant follicular epithelium.   3. HTN and BPH -continue medication, will monitor his blood pressure closely during treatment.     PLAN: -proceed with nivolumab today -lab, flush, f/u, and nivolumab in 2 weeks, plan to change dose to 480 mg daily from next cycle    No problem-specific Assessment & Plan notes found for this encounter.   SUMMARY OF ONCOLOGIC HISTORY: Oncology History Overview Note   Cancer Staging  Malignant neoplasm of overlapping sites of esophagus [facility]) Staging form: Esophagus - Adenocarcinoma, AJCC 8th Edition - Clinical stage from 05/13/2021: Stage IVA (cT3, cN2, cM0) - Signed by [name], MD on 05/22/2021    Malignant neoplasm of overlapping sites of esophagus ([place])  04/18/2021 Imaging   ULTRASOUND OF HEAD/NECK SOFT TISSUES  IMPRESSION: No suspicious lymphadenopathy   04/19/2021 Procedure   EGD  Impression: - Esophageal polyp(s) were found. Biopsied. - Partially obstructing, likely malignant esophageal tumor was found in the distal esophagus. - Small hiatal hernia. - A single gastric polyp. Biopsied. - Normal duodenal bulb, first portion of the duodenum and second portion of the duodenum.   04/19/2021 Pathology Results   FINAL MICROSCOPIC DIAGNOSIS: Stomach, Biopsy - FUNDIC GLAND POLYP. Negative for dysplasia. NO Helicobacter pylori organisms seen on H and E stain. Esophagus- Distal, Biopsy - INVASIVE MODERATELY DIFFERENTIATED ADENOCARCINOMA OF THE ESOPHAGUS.  - HER2 Study by Immunostain: Negative (score 1+) Esophagus- Mid, Biopsy -  INVASIVE MODERATELY DIFFERENTIATED ADENOCARCINOMA OF THE ESOPHAGUS. - HER2 Study by  Immunostain: Negative (score 1+)   05/05/2021 Imaging   CT CAP  IMPRESSION: 1. The patient's known esophageal mass is not well visualized. There is mild irregular wall thickening of the distal esophagus. Correlate with previous clinical evaluation. 2. Indeterminate mildly enlarged high right paratracheal node near the thoracic inlet. No other mediastinal adenopathy. 3. No evidence of metastatic disease in the abdomen or pelvis. 4. Stable small pulmonary nodules bilaterally consistent with benign findings. 5. Cholelithiasis, small renal cysts and Aortic Atherosclerosis (ICD10-I70.0).   05/09/2021 Initial Diagnosis   Malignant neoplasm of overlapping sites of esophagus ([place])   05/11/2021 Procedure   Upper EUS  Impression: - Mucosal nodule found in the esophagus. - Partially obstructing, malignant esophageal tumor was found at the gastroesophageal junction. - At least 3 discrete peritumoral lymph nodes - A mass was found in the gastroesophageal junction. A tissue diagnosis was obtained prior to this exam. This is of adenocarcinoma. This was staged T3 N2 Mx by endosonographic criteria. - Unable to traverse GE junction with either EGD scope or radial EUS scope.   05/13/2021 Cancer Staging   Staging form: Esophagus - Adenocarcinoma, AJCC 8th Edition - Clinical stage from 05/13/2021: Stage IVA (cT3, cN2, cM0) - Signed by [name], MD on 05/22/2021    05/16/2021 PET scan   IMPRESSION: 1. Distal esophageal mass measuring about 6.2 cm in length, maximum SUV 13.1. 2. Mildly hypermetabolic and mildly enlarged upper right paratracheal lymph node, 1.3 cm in short axis with maximum SUV 3.6, concerning for malignant involvement. 3. Hypodense 1.1 cm right thyroid nodule has a maximum SUV substantially above that of the background thyroid. A significant minority of such nodules can harbor thyroid cancer. Recommend thyroid US and biopsy (ref: J Am Coll Radiol. 2015 Feb;12(2): 143-50). 4. 3 by 4 mm  right lower lobe pulmonary nodule is similar to 05/05/2021, not hypermetabolic but below sensitive PET-CT size thresholds. Surveillance recommended. 5. Other imaging findings of potential clinical significance: Chronic left maxillary sinusitis. Aortic Atherosclerosis (ICD10-I70.0). Cholelithiasis. Prostatomegaly.   05/24/2021 - 06/13/2021 Chemotherapy          05/26/2021 Pathology Results   FINAL MICROSCOPIC DIAGNOSIS:   A. LYMPH NODE, 2R, FINE NEEDLE ASPIRATION:  - Malignant cells consistent with non-small cell carcinoma   COMMENT:  The cells are positive for cytokeratin 20 and CDX-2 (patchy). TTF-1, NapsinA, cytokeratin 5/6, p40, S100, MelanA, and cytokeratin 7 are negative. The patient's history of esophageal adenocarcinoma is noted and CDX-2 positivity is consistent with a gastrointestinal primary. This cytokeratin7/20 profile is usually seen in lower tract tumors, but this pattern can be seen in gastric/esophageal cases.   05/27/2021 - 07/04/2021 Radiation Therapy   Per Dr. Isidore Moos   06/20/2021 - 08/08/2021 Chemotherapy   Patient is on Treatment Plan : GASTROESOPHAGEAL FOLFOX q14d x 6 cycles     08/04/2021 PET scan   IMPRESSION: 1. Interval development of a new 6 mm short axis right cervical node with hypermetabolism. Finding is somewhat indeterminate given the apparent response of the hypermetabolic right paratracheal lymphadenopathy seen on the previous study. Close follow-up recommended as metastatic disease not excluded. 2. Diffuse hypermetabolic FDG accumulation noted in the esophagus today with more diffuse circumferential esophageal wall thickening on today's study than on the previous exam. Hypermetabolism in the distal esophagus at the level of the hypermetabolic neoplasm previously has decreased from SUV max = 13 to SUV max = 7 today. 3. Stable tiny 3-4 mm right  lower lobe and left upper lobe pulmonary nodules. Continued attention on follow-up recommended. 4.  Cholelithiasis. 5. Emphysema.  (ICD10-J43.9) 6.  Aortic Atherosclerois (ICD10-170.0)   08/17/2021 Pathology Results   FINAL MICROSCOPIC DIAGNOSIS:  A. LYMPH NODE, RIGHT CERVICAL, FINE NEEDLE ASPIRATION:  - No malignant cells identified  - See comment   COMMENT:  There is scant cellularity and the specimen may not be representative.    09/05/2021 Definitive Surgery   FINAL MICROSCOPIC DIAGNOSIS:   A. ESOPHAGOGASTRECTOMY:  - Invasive poorly differentiated adenocarcinoma.  - Metastatic carcinoma involving 5 of 13 lymph nodes (5/13).  - See oncology table below.   B. LYMPH NODE, LEVEL 8, EXCISION:  - One lymph node, negative for malignancy (0/1).   10/10/2021 -  Chemotherapy   Patient is on Treatment Plan : GASTROESOPHAGEAL Nivolumab q14d x 8 cycles / Nivolumab q28d        INTERVAL HISTORY:  Nathaniel Jennings is here for a follow up of esophageal cancer. He was last seen by me on 10/10/21. He presents to the clinic accompanied by his wife. He is doing well overall Had one episode of heart burn, and a few times of diarrhea  Eats well, gained 3 lbs since last visit    All other systems were reviewed with the patient and are negative.  MEDICAL HISTORY:  Past Medical History:  Diagnosis Date   Arthritis    BPH (benign prostatic hyperplasia)    Dyslipidemia    Elevated PSA    Esophageal cancer ([place])    Full dentures    GERD (gastroesophageal reflux disease)    History of melanoma excision    07/ 2011  s/p  wide excision left back--- per pathology-- negative maligant --  mild melanocytic atypia   History of palpitations    cardiologist --  dr Shelva Majestic (last note in epic 2014   Hypertension    Sleep apnea     SURGICAL HISTORY: Past Surgical History:  Procedure Laterality Date   COLONOSCOPY  10/24/2007   ESOPHAGOGASTRODUODENOSCOPY N/A 09/05/2021   Procedure: ESOPHAGOGASTRODUODENOSCOPY (EGD);  Surgeon: Lajuana Matte, MD;  Location[facility] OR;  Service: Thoracic;   Laterality: N/A;   ESOPHAGOGASTRODUODENOSCOPY (EGD) WITH PROPOFOL N/A 05/11/2021   Procedure: ESOPHAGOGASTRODUODENOSCOPY (EGD) WITH PROPOFOL;  Surgeon: Arta Silence, MD;  Location: [facility];  Service: Endoscopy;  Laterality: N/A;   EUS N/A 05/11/2021   Procedure: ESOPHAGEAL ENDOSCOPIC ULTRASOUND (EUS) RADIAL;  Surgeon: Arta Silence, MD;  Location: [facility];  Service: Endoscopy;  Laterality: N/A;   EYE SURGERY     Lasik surgery on both eyes, but still needs glasses   FINE NEEDLE ASPIRATION  05/26/2021   Procedure: FINE NEEDLE ASPIRATION (FNA) LINEAR;  Surgeon: Garner Nash, DO;  Location: [facility];  Service: Pulmonary;;   INTERCOSTAL NERVE BLOCK  09/05/2021   Procedure: INTERCOSTAL NERVE BLOCK;  Surgeon: Lajuana Matte, MD;  Location: [facility];  Service: Thoracic;;   IR IMAGING GUIDED PORT INSERTION  06/30/2021   JEJUNOSTOMY  09/05/2021   Procedure: Shanon Rosser;  Surgeon: Lajuana Matte, MD;  Location: [facility];  Service: Thoracic;;   PROSTATE BIOPSY N/A 07/02/2015   Procedure: BIOPSY TRANSRECTAL ULTRASONIC PROSTATE (TUBP);  Surgeon: Lowella Bandy, MD;  Location: [facility];  Service: Urology;  Laterality: N/A;   PROSTATE BIOPSY N/A 11/22/2015   Procedure: BIOPSY TRANSRECTAL ULTRASONIC PROSTATE (TUBP);  Surgeon: Cleon Gustin, MD;  Location: [facility];  Service: Urology;  Laterality: N/A;   TRANSTHORACIC ECHOCARDIOGRAM  11/19/2012   normal echo/  trivial TR   VIDEO BRONCHOSCOPY WITH ENDOBRONCHIAL ULTRASOUND N/A 05/26/2021   Procedure: VIDEO BRONCHOSCOPY WITH ENDOBRONCHIAL ULTRASOUND;  Surgeon: Garner Nash, DO;  Location: [facility];  Service: Pulmonary;  Laterality: N/A;   WIDE EXCISION MELANOMA LEFT BACK  05/02/2010    I have reviewed the social history and family history with the patient and they are unchanged from previous note.  ALLERGIES:  is allergic to bee venom.  MEDICATIONS:  Current Outpatient Medications   Medication Sig Dispense Refill   aspirin EC 81 MG tablet Take 81 mg by mouth in the morning.     lidocaine-prilocaine (EMLA) cream Apply 1 application topically as needed. 30 g 0   metoprolol succinate (TOPROL-XL) 50 MG 24 hr tablet Take 50 mg by mouth daily. Take with or immediately following a meal.     pantoprazole (PROTONIX) 40 MG tablet Take 1 tablet (40 mg total) by mouth 2 (two) times daily. 30 tablet 1   triamcinolone cream (KENALOG) 0.1 % Apply 1 application topically 2 (two) times daily as needed (eczema).     No current facility-administered medications for this visit.    PHYSICAL EXAMINATION: ECOG PERFORMANCE STATUS: 1 - Symptomatic but completely ambulatory  There were no vitals filed for this visit.  Wt Readings from Last 3 Encounters:  11/07/21 140 lb 9.6 oz (63.8 kg)  10/28/21 140 lb (63.5 kg)  10/25/21 142 lb (64.4 kg)     GENERAL:alert, no distress and comfortable SKIN: skin color normal, no rashes or significant lesions except diffuse skin erythema in the left lower extremity. EYES: normal, Conjunctiva are pink and non-injected, sclera clear  NEURO: alert & oriented x 3 with fluent speech  LABORATORY DATA:  I have reviewed the data as listed CBC Latest Ref Rng & Units 11/07/2021 10/25/2021 10/10/2021  WBC 4.0 - 10.5 K/uL 4.6 4.3 4.5  Hemoglobin 13.0 - 17.0 g/dL 11.4(L) 11.2(L) 10.7(L)  Hematocrit 39.0 - 52.0 % 33.4(L) 32.4(L) 30.6(L)  Platelets 150 - 400 K/uL 136(L) 150 167     CMP Latest Ref Rng & Units 11/07/2021 10/25/2021 10/10/2021  Glucose 70 - 99 mg/dL 89 226(H) 111(H)  BUN 8 - 23 mg/dL _0 Creatinine 0.61 - 1.24 mg/dL 0.65 0.66 0.75  Sodium 135 - 145 mmol/L 140 136 142  Potassium 3.5 - 5.1 mmol/L 3.7 3.2(L) 3.3(L)  Chloride 98 - 111 mmol/L 107 104 108  CO2 22 - 32 mmol/L _1 Calcium 8.9 - 10.3 mg/dL 9.1 8.6(L) 8.9  Total Protein 6.5 - 8.1 g/dL 6.6 6.4(L) 6.8  Total Bilirubin 0.3 - 1.2 mg/dL 0.5 0.5 0.4  Alkaline Phos 38 - 126 U/L 78 87  90  AST 15 - 41 U/L _2 ALT 0 - 44 U/L _3 RADIOGRAPHIC STUDIES: I have personally reviewed the radiological images as listed and agreed with the findings in the report. No results found.    No orders of the defined types were placed in this encounter.  All questions were answered. The patient knows to call the clinic with any problems, questions or concerns. No barriers to learning was detected. The total time spent in the appointment was 30 minutes.     [name], MD 11/21/2021   I, Wilburn Mylar, am acting as scribe for [name], MD.   I have reviewed the above documentation for accuracy and completeness, and I agree with the above.

## 2021-11-22 ENCOUNTER — Encounter: Payer: Self-pay | Admitting: Hematology

## 2021-11-22 LAB — T4: T4, Total: 7.4 ug/dL (ref 4.5–12.0)

## 2021-11-30 DIAGNOSIS — G4730 Sleep apnea, unspecified: Secondary | ICD-10-CM | POA: Diagnosis not present

## 2021-12-05 ENCOUNTER — Other Ambulatory Visit: Payer: Self-pay

## 2021-12-05 ENCOUNTER — Encounter: Payer: Self-pay | Admitting: Hematology

## 2021-12-05 ENCOUNTER — Inpatient Hospital Stay: Payer: BC Managed Care – PPO | Attending: Hematology | Admitting: Hematology

## 2021-12-05 ENCOUNTER — Inpatient Hospital Stay: Payer: BC Managed Care – PPO

## 2021-12-05 VITALS — BP 151/84 | HR 72 | Temp 97.8°F | Resp 18 | Ht 66.0 in | Wt 144.3 lb

## 2021-12-05 DIAGNOSIS — Z95828 Presence of other vascular implants and grafts: Secondary | ICD-10-CM

## 2021-12-05 DIAGNOSIS — C158 Malignant neoplasm of overlapping sites of esophagus: Secondary | ICD-10-CM

## 2021-12-05 DIAGNOSIS — Z79899 Other long term (current) drug therapy: Secondary | ICD-10-CM | POA: Diagnosis not present

## 2021-12-05 DIAGNOSIS — Z5112 Encounter for antineoplastic immunotherapy: Secondary | ICD-10-CM | POA: Diagnosis not present

## 2021-12-05 LAB — CBC WITH DIFFERENTIAL (CANCER CENTER ONLY)
Abs Immature Granulocytes: 0.01 10*3/uL (ref 0.00–0.07)
Basophils Absolute: 0 10*3/uL (ref 0.0–0.1)
Basophils Relative: 1 %
Eosinophils Absolute: 0.1 10*3/uL (ref 0.0–0.5)
Eosinophils Relative: 3 %
HCT: 36.7 % — ABNORMAL LOW (ref 39.0–52.0)
Hemoglobin: 12.6 g/dL — ABNORMAL LOW (ref 13.0–17.0)
Immature Granulocytes: 0 %
Lymphocytes Relative: 14 %
Lymphs Abs: 0.6 10*3/uL — ABNORMAL LOW (ref 0.7–4.0)
MCH: 31.6 pg (ref 26.0–34.0)
MCHC: 34.3 g/dL (ref 30.0–36.0)
MCV: 92 fL (ref 80.0–100.0)
Monocytes Absolute: 0.4 10*3/uL (ref 0.1–1.0)
Monocytes Relative: 11 %
Neutro Abs: 2.8 10*3/uL (ref 1.7–7.7)
Neutrophils Relative %: 71 %
Platelet Count: 124 10*3/uL — ABNORMAL LOW (ref 150–400)
RBC: 3.99 MIL/uL — ABNORMAL LOW (ref 4.22–5.81)
RDW: 13.5 % (ref 11.5–15.5)
WBC Count: 4 10*3/uL (ref 4.0–10.5)
nRBC: 0 % (ref 0.0–0.2)

## 2021-12-05 LAB — CMP (CANCER CENTER ONLY)
ALT: 15 U/L (ref 0–44)
AST: 17 U/L (ref 15–41)
Albumin: 4 g/dL (ref 3.5–5.0)
Alkaline Phosphatase: 85 U/L (ref 38–126)
Anion gap: 4 — ABNORMAL LOW (ref 5–15)
BUN: 17 mg/dL (ref 8–23)
CO2: 29 mmol/L (ref 22–32)
Calcium: 9.1 mg/dL (ref 8.9–10.3)
Chloride: 106 mmol/L (ref 98–111)
Creatinine: 0.75 mg/dL (ref 0.61–1.24)
GFR, Estimated: >60 mL/min (ref >60)
Glucose, Bld: 118 mg/dL — ABNORMAL HIGH (ref 70–99)
Potassium: 3.8 mmol/L (ref 3.5–5.1)
Sodium: 139 mmol/L (ref 135–145)
Total Bilirubin: 0.6 mg/dL (ref 0.3–1.2)
Total Protein: 6.7 g/dL (ref 6.5–8.1)

## 2021-12-05 LAB — TSH: TSH: 1.082 u[IU]/mL (ref 0.320–4.118)

## 2021-12-05 MED ORDER — KETOCONAZOLE 2 % EX CREA: 1.0000 "application " | TOPICAL_CREAM | Freq: Every day | CUTANEOUS | 0 refills | Status: DC

## 2021-12-05 MED ORDER — HEPARIN SOD (PORK) LOCK FLUSH 100 UNIT/ML IV SOLN
500.0000 [IU] | Freq: Once | INTRAVENOUS | Status: AC | PRN
  Administered 2021-12-05: 500 [IU]

## 2021-12-05 MED ORDER — SODIUM CHLORIDE 0.9 % IV SOLN: Freq: Once | INTRAVENOUS | Status: AC

## 2021-12-05 MED ORDER — SODIUM CHLORIDE 0.9% FLUSH
10.0000 mL | Freq: Once | INTRAVENOUS | Status: AC
  Administered 2021-12-05: 10 mL

## 2021-12-05 MED ORDER — SODIUM CHLORIDE 0.9% FLUSH
10.0000 mL | INTRAVENOUS | Status: DC | PRN
  Administered 2021-12-05: 10 mL

## 2021-12-05 MED ORDER — SODIUM CHLORIDE 0.9 % IV SOLN
480.0000 mg | Freq: Once | INTRAVENOUS | Status: AC
  Administered 2021-12-05: 480 mg via INTRAVENOUS
  Filled 2021-12-05: qty 48

## 2021-12-05 NOTE — Progress Notes (Signed)
Nathaniel Jennings   Telephone:(336) [phone] Fax:(336) [phone]   Clinic Follow up Note   Patient Care Team: Cyndi Bender, Hershal Coria as PCP - General (Physician Assistant) [name], MD as Consulting Physician (Oncology)  Date of Service:  12/05/2021  CHIEF COMPLAINT: f/u of esophageal cancer  CURRENT THERAPY:  Adjuvant nivolumab, q2weeks, starting 10/10/21. Increased to q4weeks with C5 (12/05/21)  ASSESSMENT & PLAN:  Nathaniel Jennings is a 66 y.o. male with   1. 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 EGD performed by Dr. Alessandra Bevels on 04/19/21 showed a partially obstructing tumor in distal esophagus and a polyp in mid esophagus. Biopsy of both confirmed invasive moderately differentiated adenocarcinoma of the esophagus, Her2 negative. -PET scan 05/16/21 showed: 6.2 cm distal esophageal mass; hypermetabolic upper right paratracheal lymph node. No other distant metastasis.  -EBUS biopsy on 05/26/21, path from 2R lymph node confirmed malignant cells consistent with his primary esophageal cancer. This node is not resectable by surgery  -He began concurrent chemoRT with weekly TC on 05/24/21. He completed radiation therapy on 07/04/21. -We changed his chemo to FOLFOX on 06/20/21, at reduced dose for first cycle due to mild neutropenia and concurrent RT. He completed 4 cycles on 08/08/21. -he underwent esophagogastrectomy on 09/05/21 under Dr. Kipp Brood. Pathology showed invasive poorly differentiated adenocarcinoma, rare single tumor cells present.  Margins are negative.  Metastatic carcinoma involved 5 out of a total of 14 lymph nodes (5/14).  -he began adjuvant nivolumab on 10/10/21. He is tolerating well overall. -labs reviewed, adequate to proceed with treatment. We are changing to increased dose every 4 weeks starting today. We will plan for restaging scan prior  to next treatment.   2. Hypermetabolic thyroid nodule -seen on PET 05/16/21 (and not on head/neck US on 04/18/21) -repeat head/neck US on 06/03/21 showed a 1.1 cm inferior right nodule, corresponding to hypermetabolic focus on PET.  -Biopsy on 08/16/21 showed scant follicular epithelium.   3. HTN, BPH, Eczema  -continue medication, will monitor his blood pressure closely during treatment. -he had a recent flare of eczema since starting nivolumab. He now notes skin changes over his left hip. I recommend Ketoconazole cream first. He plans to ask his PCP about it when he sees them soon.     PLAN: -proceed with nivolumab 4109m today -f/u and nivolumab in 4 weeks, with lab/flush and CT several days before (contrast given today) -I called in ketoconazole cream for him today    No problem-specific Assessment & Plan notes found for this encounter.   SUMMARY OF ONCOLOGIC HISTORY: Oncology History Overview Note   Cancer Staging  Malignant neoplasm of overlapping sites of esophagus ([facility] Staging form: Esophagus - Adenocarcinoma, AJCC 8th Edition - Clinical stage from 05/13/2021: Stage IVA (cT3, cN2, cM0) - Signed by FTruitt Merle MD on 05/22/2021    Malignant neoplasm of overlapping sites of esophagus ([place]  04/18/2021 Imaging   ULTRASOUND OF HEAD/NECK SOFT TISSUES  IMPRESSION: No suspicious lymphadenopathy   04/19/2021 Procedure   EGD  Impression: - Esophageal polyp(s) were found. Biopsied. - Partially obstructing, likely malignant esophageal tumor was found in the distal esophagus. - Small hiatal hernia. - A single gastric polyp. Biopsied. - Normal duodenal bulb, first portion of the duodenum and second portion of the duodenum.   04/19/2021 Pathology Results   FINAL MICROSCOPIC DIAGNOSIS: Stomach, Biopsy - FUNDIC GLAND POLYP. Negative for dysplasia. NO Helicobacter pylori organisms  seen on H and E stain. Esophagus- Distal, Biopsy - INVASIVE MODERATELY DIFFERENTIATED ADENOCARCINOMA OF  THE ESOPHAGUS.  - HER2 Study by Immunostain: Negative (score 1+) Esophagus- Mid, Biopsy - INVASIVE MODERATELY DIFFERENTIATED ADENOCARCINOMA OF THE ESOPHAGUS. - HER2 Study by Immunostain: Negative (score 1+)   05/05/2021 Imaging   CT CAP  IMPRESSION: 1. The patient's known esophageal mass is not well visualized. There is mild irregular wall thickening of the distal esophagus. Correlate with previous clinical evaluation. 2. Indeterminate mildly enlarged high right paratracheal node near the thoracic inlet. No other mediastinal adenopathy. 3. No evidence of metastatic disease in the abdomen or pelvis. 4. Stable small pulmonary nodules bilaterally consistent with benign findings. 5. Cholelithiasis, small renal cysts and Aortic Atherosclerosis (ICD10-I70.0).   05/09/2021 Initial Diagnosis   Malignant neoplasm of overlapping sites of esophagus ([place])   05/11/2021 Procedure   Upper EUS  Impression: - Mucosal nodule found in the esophagus. - Partially obstructing, malignant esophageal tumor was found at the gastroesophageal junction. - At least 3 discrete peritumoral lymph nodes - A mass was found in the gastroesophageal junction. A tissue diagnosis was obtained prior to this exam. This is of adenocarcinoma. This was staged T3 N2 Mx by endosonographic criteria. - Unable to traverse GE junction with either EGD scope or radial EUS scope.   05/13/2021 Cancer Staging   Staging form: Esophagus - Adenocarcinoma, AJCC 8th Edition - Clinical stage from 05/13/2021: Stage IVA (cT3, cN2, cM0) - Signed by [name], MD on 05/22/2021    05/16/2021 PET scan   IMPRESSION: 1. Distal esophageal mass measuring about 6.2 cm in length, maximum SUV 13.1. 2. Mildly hypermetabolic and mildly enlarged upper right paratracheal lymph node, 1.3 cm in short axis with maximum SUV 3.6, concerning for malignant involvement. 3. Hypodense 1.1 cm right thyroid nodule has a maximum SUV substantially above that of the  background thyroid. A significant minority of such nodules can harbor thyroid cancer. Recommend thyroid US and biopsy (ref: J Am Coll Radiol. 2015 Feb;12(2): 143-50). 4. 3 by 4 mm right lower lobe pulmonary nodule is similar to 05/05/2021, not hypermetabolic but below sensitive PET-CT size thresholds. Surveillance recommended. 5. Other imaging findings of potential clinical significance: Chronic left maxillary sinusitis. Aortic Atherosclerosis (ICD10-I70.0). Cholelithiasis. Prostatomegaly.   05/24/2021 - 06/13/2021 Chemotherapy          05/26/2021 Pathology Results   FINAL MICROSCOPIC DIAGNOSIS:   A. LYMPH NODE, 2R, FINE NEEDLE ASPIRATION:  - Malignant cells consistent with non-small cell carcinoma   COMMENT:  The cells are positive for cytokeratin 20 and CDX-2 (patchy). TTF-1, NapsinA, cytokeratin 5/6, p40, S100, MelanA, and cytokeratin 7 are negative. The patient's history of esophageal adenocarcinoma is noted and CDX-2 positivity is consistent with a gastrointestinal primary. This cytokeratin7/20 profile is usually seen in lower tract tumors, but this pattern can be seen in gastric/esophageal cases.   05/27/2021 - 07/04/2021 Radiation Therapy   Per Dr. Isidore Moos   06/20/2021 - 08/08/2021 Chemotherapy   Patient is on Treatment Plan : GASTROESOPHAGEAL FOLFOX q14d x 6 cycles     08/04/2021 PET scan   IMPRESSION: 1. Interval development of a new 6 mm short axis right cervical node with hypermetabolism. Finding is somewhat indeterminate given the apparent response of the hypermetabolic right paratracheal lymphadenopathy seen on the previous study. Close follow-up recommended as metastatic disease not excluded. 2. Diffuse hypermetabolic FDG accumulation noted in the esophagus today with more diffuse circumferential esophageal wall thickening on today's study than on the previous exam. Hypermetabolism in  the distal esophagus at the level of the hypermetabolic neoplasm previously has decreased  from SUV max = 13 to SUV max = 7 today. 3. Stable tiny 3-4 mm right lower lobe and left upper lobe pulmonary nodules. Continued attention on follow-up recommended. 4. Cholelithiasis. 5. Emphysema.  (ICD10-J43.9) 6.  Aortic Atherosclerois (ICD10-170.0)   08/17/2021 Pathology Results   FINAL MICROSCOPIC DIAGNOSIS:  A. LYMPH NODE, RIGHT CERVICAL, FINE NEEDLE ASPIRATION:  - No malignant cells identified  - See comment   COMMENT:  There is scant cellularity and the specimen may not be representative.    09/05/2021 Definitive Surgery   FINAL MICROSCOPIC DIAGNOSIS:   A. ESOPHAGOGASTRECTOMY:  - Invasive poorly differentiated adenocarcinoma.  - Metastatic carcinoma involving 5 of 13 lymph nodes (5/13).  - See oncology table below.   B. LYMPH NODE, LEVEL 8, EXCISION:  - One lymph node, negative for malignancy (0/1).   09/05/2021 Cancer Staging   Staging form: Esophagus - Adenocarcinoma, AJCC 8th Edition - Pathologic stage from 09/05/2021: Stage IIIB (ypT3, pN2, cM0, G3) - Signed by [name], MD on 12/03/2021 Stage prefix: Post-therapy Response to neoadjuvant therapy: Partial response Histologic grading system: 3 grade system Residual tumor (R): R0 - None    10/10/2021 -  Chemotherapy   Patient is on Treatment Plan : GASTROESOPHAGEAL Nivolumab q14d x 8 cycles / Nivolumab q28d        INTERVAL HISTORY:  Nathaniel Jennings is here for a follow up of esophageal cancer. He was last seen by me on 11/21/21. He presents to the clinic accompanied by his wife. He reports he is doing well overall. He denies issues swallowing. He notes his energy "comes and goes." His wife reports he has several areas on his skin over his left hip that are bothersome. She expressed concern for ringworm and wanted to ensure it's not related to the medicine.   All other systems were reviewed with the patient and are negative.  MEDICAL HISTORY:  Past Medical History:  Diagnosis Date   Arthritis    BPH (benign  prostatic hyperplasia)    Dyslipidemia    Elevated PSA    Esophageal cancer ([place])    Full dentures    GERD (gastroesophageal reflux disease)    History of melanoma excision    07/ 2011  s/p  wide excision left back--- per pathology-- negative maligant --  mild melanocytic atypia   History of palpitations    cardiologist --  dr Shelva Majestic (last note in epic 2014   Hypertension    Sleep apnea     SURGICAL HISTORY: Past Surgical History:  Procedure Laterality Date   COLONOSCOPY  10/24/2007   ESOPHAGOGASTRODUODENOSCOPY N/A 09/05/2021   Procedure: ESOPHAGOGASTRODUODENOSCOPY (EGD);  Surgeon: Lajuana Matte, MD;  [facility];  Service: Thoracic;  Laterality: N/A;   ESOPHAGOGASTRODUODENOSCOPY (EGD) WITH PROPOFOL N/A 05/11/2021   Procedure: ESOPHAGOGASTRODUODENOSCOPY (EGD) WITH PROPOFOL;  Surgeon: Arta Silence, MD;  Location: [facility] ENDOSCOPY;  Service: Endoscopy;  Laterality: N/A;   EUS N/A 05/11/2021   Procedure: ESOPHAGEAL ENDOSCOPIC ULTRASOUND (EUS) RADIAL;  Surgeon: Arta Silence, MD;  Location: [facility] ENDOSCOPY;  Service: Endoscopy;  Laterality: N/A;   EYE SURGERY     Lasik surgery on both eyes, but still needs glasses   FINE NEEDLE ASPIRATION  05/26/2021   Procedure: FINE NEEDLE ASPIRATION (FNA) LINEAR;  Surgeon: Garner Nash, DO;  Location: [place] ENDOSCOPY;  Service: Pulmonary;;   INTERCOSTAL NERVE BLOCK  09/05/2021   Procedure: INTERCOSTAL NERVE BLOCK;  Surgeon:  Lajuana Matte, MD;  Location: [place];  Service: Thoracic;;   IR IMAGING GUIDED PORT INSERTION  06/30/2021   JEJUNOSTOMY  09/05/2021   Procedure: Shanon Rosser;  Surgeon: Lajuana Matte, MD;  Location: [place];  Service: Thoracic;;   PROSTATE BIOPSY N/A 07/02/2015   Procedure: BIOPSY TRANSRECTAL ULTRASONIC PROSTATE (TUBP);  Surgeon: Lowella Bandy, MD;  Location[facility];  Service: Urology;  Laterality: N/A;   PROSTATE BIOPSY N/A 11/22/2015   Procedure: BIOPSY TRANSRECTAL ULTRASONIC PROSTATE  (TUBP);  Surgeon: Cleon Gustin, MD;  [facility];  Service: Urology;  Laterality: N/A;   TRANSTHORACIC ECHOCARDIOGRAM  11/19/2012   normal echo/  trivial TR   VIDEO BRONCHOSCOPY WITH ENDOBRONCHIAL ULTRASOUND N/A 05/26/2021   Procedure: VIDEO BRONCHOSCOPY WITH ENDOBRONCHIAL ULTRASOUND;  Surgeon: Garner Nash, DO;  Location: [place];  Service: Pulmonary;  Laterality: N/A;   WIDE EXCISION MELANOMA LEFT BACK  05/02/2010    I have reviewed the social history and family history with the patient and they are unchanged from previous note.  ALLERGIES:  is allergic to bee venom.  MEDICATIONS:  Current Outpatient Medications  Medication Sig Dispense Refill   ketoconazole (NIZORAL) 2 % cream Apply 1 application topically daily. 15 g 0   aspirin EC 81 MG tablet Take 81 mg by mouth in the morning.     lidocaine-prilocaine (EMLA) cream Apply 1 application topically as needed. 30 g 0   metoprolol succinate (TOPROL-XL) 50 MG 24 hr tablet Take 50 mg by mouth daily. Take with or immediately following a meal.     pantoprazole (PROTONIX) 40 MG tablet Take 1 tablet (40 mg total) by mouth 2 (two) times daily. 30 tablet 1   Skin Protectants, Misc. (EUCERIN) cream Apply topically as needed for dry skin.     No current facility-administered medications for this visit.    PHYSICAL EXAMINATION: ECOG PERFORMANCE STATUS: 1 - Symptomatic but completely ambulatory  Vitals:   12/05/21 1249  BP: (!) 151/84  Pulse: 72  Resp: 18  Temp: 97.8 F (36.6 C)  SpO2: 99%   Wt Readings from Last 3 Encounters:  12/05/21 144 lb 4.8 oz (65.5 kg)  11/21/21 144 lb (65.3 kg)  11/07/21 140 lb 9.6 oz (63.8 kg)     GENERAL:alert, no distress and comfortable SKIN: skin color, texture, turgor are normal, no rashes or significant lesions except a few large rash at left hip area with white margin  EYES: normal, Conjunctiva are pink and non-injected, sclera clear  NECK: supple, thyroid  normal size, non-tender, without nodularity LYMPH:  no palpable lymphadenopathy in the cervical, axillary  LUNGS: clear to auscultation and percussion with normal breathing effort HEART: regular rate & rhythm and no murmurs and no lower extremity edema ABDOMEN:abdomen soft, non-tender and normal bowel sounds Musculoskeletal:no cyanosis of digits and no clubbing  NEURO: alert & oriented x 3 with fluent speech, no focal motor/sensory deficits  LABORATORY DATA:  I have reviewed the data as listed CBC Latest Ref Rng & Units 12/05/2021 11/21/2021 11/07/2021  WBC 4.0 - 10.5 K/uL 4.0 3.9(L) 4.6  Hemoglobin 13.0 - 17.0 g/dL 12.6(L) 12.1(L) 11.4(L)  Hematocrit 39.0 - 52.0 % 36.7(L) 36.0(L) 33.4(L)  Platelets 150 - 400 K/uL 124(L) 126(L) 136(L)     CMP Latest Ref Rng & Units 11/21/2021 11/07/2021 10/25/2021  Glucose 70 - 99 mg/dL 108(H) 89 226(H)  BUN 8 - 23 mg/dL _0 Creatinine 0.61 - 1.24 mg/dL 0.66 0.65 0.66  Sodium 135 - 145 mmol/L 138 140 136  Potassium 3.5 - 5.1 mmol/L 3.7 3.7 3.2(L)  Chloride 98 - 111 mmol/L 106 107 104  CO2 22 - 32 mmol/L _0 Calcium 8.9 - 10.3 mg/dL 8.9 9.1 8.6(L)  Total Protein 6.5 - 8.1 g/dL 6.6 6.6 6.4(L)  Total Bilirubin 0.3 - 1.2 mg/dL 0.5 0.5 0.5  Alkaline Phos 38 - 126 U/L 82 78 87  AST 15 - 41 U/L _1 ALT 0 - 44 U/L _2 RADIOGRAPHIC STUDIES: I have personally reviewed the radiological images as listed and agreed with the findings in the report. No results found.    Orders Placed This Encounter  Procedures   CT CHEST ABDOMEN PELVIS W CONTRAST    Standing Status:   Future    Standing Expiration Date:   12/05/2022    Order Specific Question:   Preferred imaging location?    Answer:   [facility]    Order Specific Question:   Is Oral Contrast requested for this exam?    Answer:   Yes, Per Radiology protocol   All questions were answered. The patient knows to call the clinic with any problems, questions or concerns. No  barriers to learning was detected. The total time spent in the appointment was 30 minutes.     [name], MD 12/05/2021   I, Wilburn Mylar, am acting as scribe for [name], MD.   I have reviewed the above documentation for accuracy and completeness, and I agree with the above.

## 2021-12-05 NOTE — Patient Instructions (Signed)
[facility] MEDICAL [facility]COLOG[facility]  Discharge Instructions: ?Thank you for choosing [facility] to provide your oncology and hematology care.  ? ?If you have a lab appointment with the Cancer Center, please go directly to the Cancer Center and check in at the registration area. ?  ?Wear comfortable clothing and clothing appropriate for easy access to any Portacath or PICC line.  ? ?We strive to give you quality time with your provider. You may need to reschedule your appointment if you arrive late (15 or more minutes).  Arriving late affects you and other patients whose appointments are after yours.  Also, if you miss three or more appointments without notifying the office, you may be dismissed from the clinic at the provider?s discretion.    ?  ?For prescription refill requests, have your pharmacy contact our office and allow 72 hours for refills to be completed.   ? ?Today you received the following chemotherapy and/or immunotherapy agents Opdivo    ?  ?To help prevent nausea and vomiting after your treatment, we encourage you to take your nausea medication as directed. ? ?BELOW ARE SYMPTOMS THAT SHOULD BE REPORTED IMMEDIATELY: ?*FEVER GREATER THAN 100.4 F (38 ?C) OR HIGHER ?*CHILLS OR SWEATING ?*NAUSEA AND VOMITING THAT IS NOT CONTROLLED WITH YOUR NAUSEA MEDICATION ?*UNUSUAL SHORTNESS OF BREATH ?*UNUSUAL BRUISING OR BLEEDING ?*URINARY PROBLEMS (pain or burning when urinating, or frequent urination) ?*BOWEL PROBLEMS (unusual diarrhea, constipation, pain near the anus) ?TENDERNESS IN MOUTH AND THROAT WITH OR WITHOUT PRESENCE OF ULCERS (sore throat, sores in mouth, or a toothache) ?UNUSUAL RASH, SWELLING OR PAIN  ?UNUSUAL VAGINAL DISCHARGE OR ITCHING  ? ?Items with * indicate a potential emergency and should be followed up as soon as possible or go to the Emergency Department if any problems should occur. ? ?Please show the CHEMOTHERAPY ALERT CARD or IMMUNOTHERAPY ALERT CARD at check-in to the  Emergency Department and triage nurse. ? ?Should you have questions after your visit or need to cancel or reschedule your appointment, please contact [facility]  Dept: [phone]  and follow the prompts.  Office hours are 8:00 a.m. to 4:30 p.m. Monday - Friday. Please note that voicemails left after 4:00 p.m. may not be returned until the following business day.  We are closed weekends and major holidays. You have access to a nurse at all times for urgent questions. Please call the main number to the clinic Dept: [phone] and follow the prompts. ? ? ?For any non-urgent questions, you may also contact your provider using MyChart. We now offer e-Visits for anyone 18 and older to request care online for non-urgent symptoms. For details visit [email or web address]. ?  ?Also download the MyChart app! Go to the app store, search "MyChart", open the app, select [place], and log in with your MyChart username and password. ? ?Due to Covid, a mask is required upon entering the hospital/clinic. If you do not have a mask, one will be given to you upon arrival. For doctor visits, patients may have 1 support person aged 18 or older with them. For treatment visits, patients cannot have anyone with them due to current Covid guidelines and our immunocompromised population.  ? ?

## 2021-12-06 LAB — T4: T4, Total: 7.3 ug/dL (ref 4.5–12.0)

## 2021-12-13 DIAGNOSIS — I10 Essential (primary) hypertension: Secondary | ICD-10-CM | POA: Diagnosis not present

## 2021-12-13 DIAGNOSIS — Z0000 Encounter for general adult medical examination without abnormal findings: Secondary | ICD-10-CM | POA: Diagnosis not present

## 2021-12-13 DIAGNOSIS — Z23 Encounter for immunization: Secondary | ICD-10-CM | POA: Diagnosis not present

## 2021-12-13 DIAGNOSIS — C159 Malignant neoplasm of esophagus, unspecified: Secondary | ICD-10-CM | POA: Diagnosis not present

## 2021-12-13 DIAGNOSIS — K219 Gastro-esophageal reflux disease without esophagitis: Secondary | ICD-10-CM | POA: Diagnosis not present

## 2021-12-14 DIAGNOSIS — Z0000 Encounter for general adult medical examination without abnormal findings: Secondary | ICD-10-CM | POA: Diagnosis not present

## 2021-12-28 DIAGNOSIS — G4730 Sleep apnea, unspecified: Secondary | ICD-10-CM | POA: Diagnosis not present

## 2021-12-30 ENCOUNTER — Other Ambulatory Visit: Payer: Self-pay

## 2021-12-30 ENCOUNTER — Inpatient Hospital Stay: Payer: BC Managed Care – PPO | Attending: Hematology

## 2021-12-30 ENCOUNTER — Ambulatory Visit (HOSPITAL_COMMUNITY)
Admission: RE | Admit: 2021-12-30 | Discharge: 2021-12-30 | Disposition: A | Discharge status: Home / Self Care | Payer: BC Managed Care – PPO | Source: Ambulatory Visit | Attending: Hematology | Admitting: Hematology

## 2021-12-30 DIAGNOSIS — N400 Enlarged prostate without lower urinary tract symptoms: Secondary | ICD-10-CM | POA: Diagnosis not present

## 2021-12-30 DIAGNOSIS — K8020 Calculus of gallbladder without cholecystitis without obstruction: Secondary | ICD-10-CM | POA: Diagnosis not present

## 2021-12-30 DIAGNOSIS — J984 Other disorders of lung: Secondary | ICD-10-CM | POA: Diagnosis not present

## 2021-12-30 DIAGNOSIS — I700 Atherosclerosis of aorta: Secondary | ICD-10-CM | POA: Diagnosis not present

## 2021-12-30 DIAGNOSIS — C158 Malignant neoplasm of overlapping sites of esophagus: Secondary | ICD-10-CM | POA: Diagnosis not present

## 2021-12-30 DIAGNOSIS — Z5112 Encounter for antineoplastic immunotherapy: Secondary | ICD-10-CM | POA: Insufficient documentation

## 2021-12-30 DIAGNOSIS — Z95828 Presence of other vascular implants and grafts: Secondary | ICD-10-CM

## 2021-12-30 DIAGNOSIS — C779 Secondary and unspecified malignant neoplasm of lymph node, unspecified: Secondary | ICD-10-CM | POA: Insufficient documentation

## 2021-12-30 DIAGNOSIS — N281 Cyst of kidney, acquired: Secondary | ICD-10-CM | POA: Diagnosis not present

## 2021-12-30 LAB — CMP (CANCER CENTER ONLY)
ALT: 19 U/L (ref 0–44)
AST: 19 U/L (ref 15–41)
Albumin: 4.3 g/dL (ref 3.5–5.0)
Alkaline Phosphatase: 86 U/L (ref 38–126)
Anion gap: 6 (ref 5–15)
BUN: 16 mg/dL (ref 8–23)
CO2: 28 mmol/L (ref 22–32)
Calcium: 9.4 mg/dL (ref 8.9–10.3)
Chloride: 103 mmol/L (ref 98–111)
Creatinine: 0.75 mg/dL (ref 0.61–1.24)
GFR, Estimated: >60 mL/min (ref >60)
Glucose, Bld: 104 mg/dL — ABNORMAL HIGH (ref 70–99)
Potassium: 4 mmol/L (ref 3.5–5.1)
Sodium: 137 mmol/L (ref 135–145)
Total Bilirubin: 0.9 mg/dL (ref 0.3–1.2)
Total Protein: 7 g/dL (ref 6.5–8.1)

## 2021-12-30 LAB — CBC WITH DIFFERENTIAL (CANCER CENTER ONLY)
Abs Immature Granulocytes: 0.01 10*3/uL (ref 0.00–0.07)
Basophils Absolute: 0.1 10*3/uL (ref 0.0–0.1)
Basophils Relative: 1 %
Eosinophils Absolute: 0.2 10*3/uL (ref 0.0–0.5)
Eosinophils Relative: 3 %
HCT: 39 % (ref 39.0–52.0)
Hemoglobin: 13.3 g/dL (ref 13.0–17.0)
Immature Granulocytes: 0 %
Lymphocytes Relative: 15 %
Lymphs Abs: 0.7 10*3/uL (ref 0.7–4.0)
MCH: 30.8 pg (ref 26.0–34.0)
MCHC: 34.1 g/dL (ref 30.0–36.0)
MCV: 90.3 fL (ref 80.0–100.0)
Monocytes Absolute: 0.5 10*3/uL (ref 0.1–1.0)
Monocytes Relative: 11 %
Neutro Abs: 3.1 10*3/uL (ref 1.7–7.7)
Neutrophils Relative %: 70 %
Platelet Count: 167 10*3/uL (ref 150–400)
RBC: 4.32 MIL/uL (ref 4.22–5.81)
RDW: 13.6 % (ref 11.5–15.5)
WBC Count: 4.4 10*3/uL (ref 4.0–10.5)
nRBC: 0 % (ref 0.0–0.2)

## 2021-12-30 LAB — TSH: TSH: 1.284 u[IU]/mL (ref 0.320–4.118)

## 2021-12-30 MED ORDER — IOHEXOL 300 MG/ML  SOLN
80.0000 mL | Freq: Once | INTRAMUSCULAR | Status: AC | PRN
  Administered 2021-12-30: 80 mL via INTRAVENOUS

## 2021-12-30 MED ORDER — HEPARIN SOD (PORK) LOCK FLUSH 100 UNIT/ML IV SOLN
INTRAVENOUS | Status: AC
  Administered 2021-12-30: 500 [IU]
  Filled 2021-12-30: qty 5

## 2021-12-30 MED ORDER — SODIUM CHLORIDE (PF) 0.9 % IJ SOLN
INTRAMUSCULAR | Status: AC
  Filled 2021-12-30: qty 50

## 2021-12-30 MED ORDER — SODIUM CHLORIDE 0.9% FLUSH
10.0000 mL | Freq: Once | INTRAVENOUS | Status: AC
  Administered 2021-12-30: 10 mL

## 2021-12-31 LAB — T4: T4, Total: 7.4 ug/dL (ref 4.5–12.0)

## 2022-01-02 ENCOUNTER — Other Ambulatory Visit: Payer: Self-pay

## 2022-01-02 ENCOUNTER — Encounter: Payer: Self-pay | Admitting: Hematology

## 2022-01-02 ENCOUNTER — Inpatient Hospital Stay (HOSPITAL_BASED_OUTPATIENT_CLINIC_OR_DEPARTMENT_OTHER): Payer: BC Managed Care – PPO | Admitting: Hematology

## 2022-01-02 ENCOUNTER — Inpatient Hospital Stay: Payer: BC Managed Care – PPO

## 2022-01-02 VITALS — BP 146/96 | HR 69 | Temp 98.0°F | Resp 18 | Ht 66.0 in | Wt 145.5 lb

## 2022-01-02 DIAGNOSIS — Z5112 Encounter for antineoplastic immunotherapy: Secondary | ICD-10-CM | POA: Diagnosis not present

## 2022-01-02 DIAGNOSIS — C779 Secondary and unspecified malignant neoplasm of lymph node, unspecified: Secondary | ICD-10-CM | POA: Diagnosis not present

## 2022-01-02 DIAGNOSIS — C158 Malignant neoplasm of overlapping sites of esophagus: Secondary | ICD-10-CM

## 2022-01-02 MED ORDER — HEPARIN SOD (PORK) LOCK FLUSH 100 UNIT/ML IV SOLN
500.0000 [IU] | Freq: Once | INTRAVENOUS | Status: AC | PRN
  Administered 2022-01-02: 500 [IU]

## 2022-01-02 MED ORDER — SODIUM CHLORIDE 0.9 % IV SOLN: Freq: Once | INTRAVENOUS | Status: AC

## 2022-01-02 MED ORDER — SODIUM CHLORIDE 0.9 % IV SOLN
480.0000 mg | Freq: Once | INTRAVENOUS | Status: AC
  Administered 2022-01-02: 480 mg via INTRAVENOUS
  Filled 2022-01-02: qty 48

## 2022-01-02 MED ORDER — SODIUM CHLORIDE 0.9% FLUSH
10.0000 mL | INTRAVENOUS | Status: DC | PRN
  Administered 2022-01-02: 10 mL

## 2022-01-02 NOTE — Patient Instructions (Signed)
[facility]ICAL [facility]COL[facility]  Discharge Instructions: ?Thank you for choosing [facility] to provide your oncology and hematology care.  ? ?If you have a lab appointment with the Cancer Center, please go directly to the Cancer Center and check in at the registration area. ?  ?Wear comfortable clothing and clothing appropriate for easy access to any Portacath or PICC line.  ? ?We strive to give you quality time with your provider. You may need to reschedule your appointment if you arrive late (15 or more minutes).  Arriving late affects you and other patients whose appointments are after yours.  Also, if you miss three or more appointments without notifying the office, you may be dismissed from the clinic at the provider?s discretion.    ?  ?For prescription refill requests, have your pharmacy contact our office and allow 72 hours for refills to be completed.   ? ?Today you received the following chemotherapy and/or immunotherapy agents Opdivo    ?  ?To help prevent nausea and vomiting after your treatment, we encourage you to take your nausea medication as directed. ? ?BELOW ARE SYMPTOMS THAT SHOULD BE REPORTED IMMEDIATELY: ?*FEVER GREATER THAN 100.4 F (38 ?C) OR HIGHER ?*CHILLS OR SWEATING ?*NAUSEA AND VOMITING THAT IS NOT CONTROLLED WITH YOUR NAUSEA MEDICATION ?*UNUSUAL SHORTNESS OF BREATH ?*UNUSUAL BRUISING OR BLEEDING ?*URINARY PROBLEMS (pain or burning when urinating, or frequent urination) ?*BOWEL PROBLEMS (unusual diarrhea, constipation, pain near the anus) ?TENDERNESS IN MOUTH AND THROAT WITH OR WITHOUT PRESENCE OF ULCERS (sore throat, sores in mouth, or a toothache) ?UNUSUAL RASH, SWELLING OR PAIN  ?UNUSUAL VAGINAL DISCHARGE OR ITCHING  ? ?Items with * indicate a potential emergency and should be followed up as soon as possible or go to the Emergency Department if any problems should occur. ? ?Please show the CHEMOTHERAPY ALERT CARD or IMMUNOTHERAPY ALERT CARD at check-in to the  Emergency Department and triage nurse. ? ?Should you have questions after your visit or need to cancel or reschedule your appointment, please contact [facility]  Dept: [phone]  and follow the prompts.  Office hours are 8:00 a.m. to 4:30 p.m. Monday - Friday. Please note that voicemails left after 4:00 p.m. may not be returned until the following business day.  We are closed weekends and major holidays. You have access to a nurse at all times for urgent questions. Please call the main number to the clinic Dept: [phone] and follow the prompts. ? ? ?For any non-urgent questions, you may also contact your provider using MyChart. We now offer e-Visits for anyone 18 and older to request care online for non-urgent symptoms. For details visit [email or web address]. ?  ?Also download the MyChart app! Go to the app store, search "MyChart", open the app, select [place], and log in with your MyChart username and password. ? ?Due to Covid, a mask is required upon entering the hospital/clinic. If you do not have a mask, one will be given to you upon arrival. For doctor visits, patients may have 1 support person aged 18 or older with them. For treatment visits, patients cannot have anyone with them due to current Covid guidelines and our immunocompromised population.  ? ?

## 2022-01-02 NOTE — Progress Notes (Signed)
[place]   [phone] [phone]   Clinic Follow up Note   Patient Care Team: Cyndi Bender, Hershal Coria as PCP - General (Physician Assistant) [name], MD as Consulting Physician (Oncology)  Date of Service:  01/02/2022  CHIEF COMPLAINT: f/u of esophageal cancer  CURRENT THERAPY:  Adjuvant nivolumab, q2weeks, starting 10/10/21. Increased to q4weeks with C5 (12/05/21)  ASSESSMENT & PLAN:  Nathaniel Jennings is a 66 y.o. male with   1. 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 EGD performed by Dr. Alessandra Bevels on 04/19/21 showed a partially obstructing tumor in distal esophagus and a polyp in mid esophagus. Biopsy of both confirmed invasive moderately differentiated adenocarcinoma of the esophagus, Her2 negative. -PET scan 05/16/21 showed: 6.2 cm distal esophageal mass; hypermetabolic upper right paratracheal lymph node. No other distant metastasis.  -EBUS biopsy on 05/26/21, path from 2R lymph node confirmed malignant cells consistent with his primary esophageal cancer. This node is not resectable by surgery  -He received concurrent chemoRT with weekly TC 8/2-9/12/22. -We changed his chemo to FOLFOX on 06/20/21, at reduced dose for first cycle due to mild neutropenia and concurrent RT. He completed 4 cycles on 08/08/21. -he underwent esophagogastrectomy on 09/05/21 under Dr. Kipp Brood. Pathology showed invasive poorly differentiated adenocarcinoma, rare single tumor cells present.  Margins are negative.  Metastatic carcinoma involved 5 out of a total of 14 lymph nodes (5/14).  -he began adjuvant nivolumab on 10/10/21. He is tolerating well overall. -restaging CT CAP on 12/30/21 showed NED. I reviewed the results with them today. -he is now receiving nivolumab every 4 weeks and tolerating well. Labs from 12/30/21 reviewed, overall WNL.  Will continue    2. Hypermetabolic thyroid nodule -seen on PET 05/16/21 (and not on head/neck US on 04/18/21) -repeat head/neck US on 06/03/21 showed a 1.1 cm inferior right nodule, corresponding to hypermetabolic focus on PET.  -Biopsy on 08/16/21 showed scant follicular epithelium.   3. HTN, BPH, Eczema  -continue medication, will monitor his blood pressure closely during treatment. -he has persistent b/l lower leg rash since starting nivolumab. He also reports ringworm to his abdomen. I previously prescribed ketaconazole cream on 12/05/21. He asked about steroid cream; I advised him to only use sparingly.     PLAN: -proceed with nivolumab today -lab, flush, f/u and nivolumab in 4 weeks   No problem-specific Assessment & Plan notes found for this encounter.   SUMMARY OF ONCOLOGIC HISTORY: Oncology History Overview Note   Cancer Staging  Malignant neoplasm of overlapping sites of esophagus [facility]) Staging form: Esophagus - Adenocarcinoma, AJCC 8th Edition - Clinical stage from 05/13/2021: Stage IVA (cT3, cN2, cM0) - Signed by [name], MD on 05/22/2021    Malignant neoplasm of overlapping sites of esophagus ([place])  04/18/2021 Imaging   ULTRASOUND OF HEAD/NECK SOFT TISSUES  IMPRESSION: No suspicious lymphadenopathy   04/19/2021 Procedure   EGD  Impression: - Esophageal polyp(s) were found. Biopsied. - Partially obstructing, likely malignant esophageal tumor was found in the distal esophagus. - Small hiatal hernia. - A single gastric polyp. Biopsied. - Normal duodenal bulb, first portion of the duodenum and second portion of the duodenum.   04/19/2021 Pathology Results   FINAL MICROSCOPIC DIAGNOSIS: Stomach, Biopsy - FUNDIC GLAND POLYP. Negative for dysplasia. NO Helicobacter pylori organisms seen on H and E stain. Esophagus- Distal, Biopsy - INVASIVE MODERATELY DIFFERENTIATED ADENOCARCINOMA OF THE ESOPHAGUS.  - HER2  Study by Immunostain: Negative (score 1+) Esophagus- Mid,  Biopsy - INVASIVE MODERATELY DIFFERENTIATED ADENOCARCINOMA OF THE ESOPHAGUS. - HER2 Study by Immunostain: Negative (score 1+)   05/05/2021 Imaging   CT CAP  IMPRESSION: 1. The patient's known esophageal mass is not well visualized. There is mild irregular wall thickening of the distal esophagus. Correlate with previous clinical evaluation. 2. Indeterminate mildly enlarged high right paratracheal node near the thoracic inlet. No other mediastinal adenopathy. 3. No evidence of metastatic disease in the abdomen or pelvis. 4. Stable small pulmonary nodules bilaterally consistent with benign findings. 5. Cholelithiasis, small renal cysts and Aortic Atherosclerosis (ICD10-I70.0).   05/09/2021 Initial Diagnosis   Malignant neoplasm of overlapping sites of esophagus ([place])   05/11/2021 Procedure   Upper EUS  Impression: - Mucosal nodule found in the esophagus. - Partially obstructing, malignant esophageal tumor was found at the gastroesophageal junction. - At least 3 discrete peritumoral lymph nodes - A mass was found in the gastroesophageal junction. A tissue diagnosis was obtained prior to this exam. This is of adenocarcinoma. This was staged T3 N2 Mx by endosonographic criteria. - Unable to traverse GE junction with either EGD scope or radial EUS scope.   05/13/2021 Cancer Staging   Staging form: Esophagus - Adenocarcinoma, AJCC 8th Edition - Clinical stage from 05/13/2021: Stage IVA (cT3, cN2, cM0) - Signed by [name], MD on 05/22/2021    05/16/2021 PET scan   IMPRESSION: 1. Distal esophageal mass measuring about 6.2 cm in length, maximum SUV 13.1. 2. Mildly hypermetabolic and mildly enlarged upper right paratracheal lymph node, 1.3 cm in short axis with maximum SUV 3.6, concerning for malignant involvement. 3. Hypodense 1.1 cm right thyroid nodule has a maximum SUV substantially above that of the background thyroid. A significant minority of such nodules can harbor thyroid cancer.  Recommend thyroid US and biopsy (ref: J Am Coll Radiol. 2015 Feb;12(2): 143-50). 4. 3 by 4 mm right lower lobe pulmonary nodule is similar to 05/05/2021, not hypermetabolic but below sensitive PET-CT size thresholds. Surveillance recommended. 5. Other imaging findings of potential clinical significance: Chronic left maxillary sinusitis. Aortic Atherosclerosis (ICD10-I70.0). Cholelithiasis. Prostatomegaly.   05/24/2021 - 06/13/2021 Chemotherapy          05/26/2021 Pathology Results   FINAL MICROSCOPIC DIAGNOSIS:   A. LYMPH NODE, 2R, FINE NEEDLE ASPIRATION:  - Malignant cells consistent with non-small cell carcinoma   COMMENT:  The cells are positive for cytokeratin 20 and CDX-2 (patchy). TTF-1, NapsinA, cytokeratin 5/6, p40, S100, MelanA, and cytokeratin 7 are negative. The patient's history of esophageal adenocarcinoma is noted and CDX-2 positivity is consistent with a gastrointestinal primary. This cytokeratin7/20 profile is usually seen in lower tract tumors, but this pattern can be seen in gastric/esophageal cases.   05/27/2021 - 07/04/2021 Radiation Therapy   Per Dr. Isidore Moos   06/20/2021 - 08/08/2021 Chemotherapy   Patient is on Treatment Plan : GASTROESOPHAGEAL FOLFOX q14d x 6 cycles     08/04/2021 PET scan   IMPRESSION: 1. Interval development of a new 6 mm short axis right cervical node with hypermetabolism. Finding is somewhat indeterminate given the apparent response of the hypermetabolic right paratracheal lymphadenopathy seen on the previous study. Close follow-up recommended as metastatic disease not excluded. 2. Diffuse hypermetabolic FDG accumulation noted in the esophagus today with more diffuse circumferential esophageal wall thickening on today's study than on the previous exam. Hypermetabolism in the distal esophagus at the level of the hypermetabolic neoplasm previously has decreased from SUV max = 13 to SUV  max = 7 today. 3. Stable tiny 3-4 mm right lower lobe and left  upper lobe pulmonary nodules. Continued attention on follow-up recommended. 4. Cholelithiasis. 5. Emphysema.  (ICD10-J43.9) 6.  Aortic Atherosclerois (ICD10-170.0)   08/17/2021 Pathology Results   FINAL MICROSCOPIC DIAGNOSIS:  A. LYMPH NODE, RIGHT CERVICAL, FINE NEEDLE ASPIRATION:  - No malignant cells identified  - See comment   COMMENT:  There is scant cellularity and the specimen may not be representative.    09/05/2021 Definitive Surgery   FINAL MICROSCOPIC DIAGNOSIS:   A. ESOPHAGOGASTRECTOMY:  - Invasive poorly differentiated adenocarcinoma.  - Metastatic carcinoma involving 5 of 13 lymph nodes (5/13).  - See oncology table below.   B. LYMPH NODE, LEVEL 8, EXCISION:  - One lymph node, negative for malignancy (0/1).   09/05/2021 Cancer Staging   Staging form: Esophagus - Adenocarcinoma, AJCC 8th Edition - Pathologic stage from 09/05/2021: Stage IIIB (ypT3, pN2, cM0, G3) - Signed by [name], MD on 12/03/2021 Stage prefix: Post-therapy Response to neoadjuvant therapy: Partial response Histologic grading system: 3 grade system Residual tumor (R): R0 - None    10/10/2021 -  Chemotherapy   Patient is on Treatment Plan : GASTROESOPHAGEAL Nivolumab q14d x 8 cycles / Nivolumab q28d     12/30/2021 Imaging   EXAM: CT CHEST, ABDOMEN, AND PELVIS WITH CONTRAST  IMPRESSION: Status post esophagectomy with gastric pull-through procedure. No evidence of recurrent or metastatic carcinoma within the chest, abdomen, or pelvis.   Cholelithiasis. No radiographic evidence of cholecystitis.   Stable enlarged prostate.   Aortic Atherosclerosis (ICD10-I70.0).      INTERVAL HISTORY:  TENNIS MCKINNON is here for a follow up of esophageal cancer. He was last seen by me on 12/05/21. He presents to the clinic accompanied by his wife. He reports persistent rash to his lower legs. He also notes continued skin changes, which he states is ringworm.    All other systems were reviewed with  the patient and are negative.  MEDICAL HISTORY:  Past Medical History:  Diagnosis Date   Arthritis    BPH (benign prostatic hyperplasia)    Dyslipidemia    Elevated PSA    Esophageal cancer ([place])    Full dentures    GERD (gastroesophageal reflux disease)    History of melanoma excision    07/ 2011  s/p  wide excision left back--- per pathology-- negative maligant --  mild melanocytic atypia   History of palpitations    cardiologist --  dr Shelva Majestic (last note in epic 2014   Hypertension    Sleep apnea     SURGICAL HISTORY: Past Surgical History:  Procedure Laterality Date   COLONOSCOPY  10/24/2007   ESOPHAGOGASTRODUODENOSCOPY N/A 09/05/2021   Procedure: ESOPHAGOGASTRODUODENOSCOPY (EGD);  Surgeon: Lajuana Matte, MD;  Location[facility] OR;  Service: Thoracic;  Laterality: N/A;   ESOPHAGOGASTRODUODENOSCOPY (EGD) WITH PROPOFOL N/A 05/11/2021   Procedure: ESOPHAGOGASTRODUODENOSCOPY (EGD) WITH PROPOFOL;  Surgeon: Arta Silence, MD;  Location: [facility] ENDOSCOPY;  Service: Endoscopy;  Laterality: N/A;   EUS N/A 05/11/2021   Procedure: ESOPHAGEAL ENDOSCOPIC ULTRASOUND (EUS) RADIAL;  Surgeon: Arta Silence, MD;  Location: [facility] ENDOSCOPY;  Service: Endoscopy;  Laterality: N/A;   EYE SURGERY     Lasik surgery on both eyes, but still needs glasses   FINE NEEDLE ASPIRATION  05/26/2021   Procedure: FINE NEEDLE ASPIRATION (FNA) LINEAR;  Surgeon: Garner Nash, DO;  Location: [facility]COPY;  Service: Pulmonary;;   INTERCOSTAL NERVE BLOCK  09/05/2021   Procedure: Dossie Der  NERVE BLOCK;  Surgeon: Lajuana Matte, MD;  Location: [place];  Service: Thoracic;;   IR IMAGING GUIDED PORT INSERTION  06/30/2021   JEJUNOSTOMY  09/05/2021   Procedure: Shanon Rosser;  Surgeon: Lajuana Matte, MD;  Location: [place];  Service: Thoracic;;   PROSTATE BIOPSY N/A 07/02/2015   Procedure: BIOPSY TRANSRECTAL ULTRASONIC PROSTATE (TUBP);  Surgeon: Lowella Bandy, MD;  Location[facility];   Service: Urology;  Laterality: N/A;   PROSTATE BIOPSY N/A 11/22/2015   Procedure: BIOPSY TRANSRECTAL ULTRASONIC PROSTATE (TUBP);  Surgeon: Cleon Gustin, MD;  Location: [facility];  Service: Urology;  Laterality: N/A;   TRANSTHORACIC ECHOCARDIOGRAM  11/19/2012   normal echo/  trivial TR   VIDEO BRONCHOSCOPY WITH ENDOBRONCHIAL ULTRASOUND N/A 05/26/2021   Procedure: VIDEO BRONCHOSCOPY WITH ENDOBRONCHIAL ULTRASOUND;  Surgeon: Garner Nash, DO;  Location: [place];  Service: Pulmonary;  Laterality: N/A;   WIDE EXCISION MELANOMA LEFT BACK  05/02/2010    I have reviewed the social history and family history with the patient and they are unchanged from previous note.  ALLERGIES:  is allergic to bee venom.  MEDICATIONS:  Current Outpatient Medications  Medication Sig Dispense Refill   aspirin EC 81 MG tablet Take 81 mg by mouth in the morning.     ketoconazole (NIZORAL) 2 % cream Apply 1 application topically daily. 15 g 0   lidocaine-prilocaine (EMLA) cream Apply 1 application topically as needed. 30 g 0   metoprolol succinate (TOPROL-XL) 50 MG 24 hr tablet Take 50 mg by mouth daily. Take with or immediately following a meal.     pantoprazole (PROTONIX) 40 MG tablet Take 1 tablet (40 mg total) by mouth 2 (two) times daily. 30 tablet 1   Skin Protectants, Misc. (EUCERIN) cream Apply topically as needed for dry skin.     No current facility-administered medications for this visit.    PHYSICAL EXAMINATION: ECOG PERFORMANCE STATUS: 0 - Asymptomatic  Vitals:   01/02/22 1308  BP: (!) 146/96  Pulse: 69  Resp: 18  Temp: 98 F (36.7 C)  SpO2: 100%   Wt Readings from Last 3 Encounters:  01/02/22 145 lb 8 oz (66 kg)  12/05/21 144 lb 4.8 oz (65.5 kg)  11/21/21 144 lb (65.3 kg)     GENERAL:alert, no distress and comfortable SKIN: skin color, texture, turgor are normal, no significant lesions, (+) diffuse rash to b/l lower legs EYES: normal, Conjunctiva are pink  and non-injected, sclera clear NEURO: alert & oriented x 3 with fluent speech, no focal motor/sensory deficits  LABORATORY DATA:  I have reviewed the data as listed CBC Latest Ref Rng & Units 12/30/2021 12/05/2021 11/21/2021  WBC 4.0 - 10.5 K/uL 4.4 4.0 3.9(L)  Hemoglobin 13.0 - 17.0 g/dL 13.3 12.6(L) 12.1(L)  Hematocrit 39.0 - 52.0 % 39.0 36.7(L) 36.0(L)  Platelets 150 - 400 K/uL 167 124(L) 126(L)     CMP Latest Ref Rng & Units 12/30/2021 12/05/2021 11/21/2021  Glucose 70 - 99 mg/dL 104(H) 118(H) 108(H)  BUN 8 - 23 mg/dL _0 Creatinine 0.61 - 1.24 mg/dL 0.75 0.75 0.66  Sodium 135 - 145 mmol/L 137 139 138  Potassium 3.5 - 5.1 mmol/L 4.0 3.8 3.7  Chloride 98 - 111 mmol/L 103 106 106  CO2 22 - 32 mmol/L _1 Calcium 8.9 - 10.3 mg/dL 9.4 9.1 8.9  Total Protein 6.5 - 8.1 g/dL 7.0 6.7 6.6  Total Bilirubin 0.3 - 1.2 mg/dL 0.9 0.6 0.5  Alkaline Phos 38 - 126 U/L 86 85 82  AST 15 - 41 U/L _0 ALT 0 - 44 U/L _1 RADIOGRAPHIC STUDIES: I have personally reviewed the radiological images as listed and agreed with the findings in the report. No results found.    No orders of the defined types were placed in this encounter.  All questions were answered. The patient knows to call the clinic with any problems, questions or concerns. No barriers to learning was detected. The total time spent in the appointment was 30 minutes.     [name], MD 01/02/2022   I, Wilburn Mylar, am acting as scribe for [name], MD.   I have reviewed the above documentation for accuracy and completeness, and I agree with the above.

## 2022-01-03 ENCOUNTER — Telehealth: Payer: Self-pay | Admitting: Hematology

## 2022-01-03 NOTE — Telephone Encounter (Signed)
Scheduled follow-up appointment per 3/13 los. Patient is aware. ?

## 2022-01-28 DIAGNOSIS — G4730 Sleep apnea, unspecified: Secondary | ICD-10-CM | POA: Diagnosis not present

## 2022-01-30 ENCOUNTER — Inpatient Hospital Stay: Payer: BC Managed Care – PPO

## 2022-01-30 ENCOUNTER — Encounter: Payer: Self-pay | Admitting: Hematology

## 2022-01-30 ENCOUNTER — Inpatient Hospital Stay: Payer: BC Managed Care – PPO | Admitting: Hematology

## 2022-01-30 ENCOUNTER — Inpatient Hospital Stay: Payer: BC Managed Care – PPO | Attending: Hematology

## 2022-01-30 ENCOUNTER — Other Ambulatory Visit: Payer: Self-pay

## 2022-01-30 VITALS — BP 140/80 | HR 71 | Temp 98.4°F | Resp 18 | Ht 66.0 in | Wt 147.2 lb

## 2022-01-30 DIAGNOSIS — Z95828 Presence of other vascular implants and grafts: Secondary | ICD-10-CM

## 2022-01-30 DIAGNOSIS — L309 Dermatitis, unspecified: Secondary | ICD-10-CM | POA: Diagnosis not present

## 2022-01-30 DIAGNOSIS — I10 Essential (primary) hypertension: Secondary | ICD-10-CM | POA: Diagnosis not present

## 2022-01-30 DIAGNOSIS — C158 Malignant neoplasm of overlapping sites of esophagus: Secondary | ICD-10-CM | POA: Diagnosis not present

## 2022-01-30 DIAGNOSIS — Z5112 Encounter for antineoplastic immunotherapy: Secondary | ICD-10-CM | POA: Insufficient documentation

## 2022-01-30 DIAGNOSIS — Z79899 Other long term (current) drug therapy: Secondary | ICD-10-CM | POA: Diagnosis not present

## 2022-01-30 DIAGNOSIS — E041 Nontoxic single thyroid nodule: Secondary | ICD-10-CM | POA: Diagnosis not present

## 2022-01-30 DIAGNOSIS — N400 Enlarged prostate without lower urinary tract symptoms: Secondary | ICD-10-CM | POA: Diagnosis not present

## 2022-01-30 LAB — CBC WITH DIFFERENTIAL (CANCER CENTER ONLY)
Abs Immature Granulocytes: 0.01 10*3/uL (ref 0.00–0.07)
Basophils Absolute: 0 10*3/uL (ref 0.0–0.1)
Basophils Relative: 1 %
Eosinophils Absolute: 0.1 10*3/uL (ref 0.0–0.5)
Eosinophils Relative: 3 %
HCT: 36.4 % — ABNORMAL LOW (ref 39.0–52.0)
Hemoglobin: 12.7 g/dL — ABNORMAL LOW (ref 13.0–17.0)
Immature Granulocytes: 0 %
Lymphocytes Relative: 18 %
Lymphs Abs: 0.9 10*3/uL (ref 0.7–4.0)
MCH: 31.8 pg (ref 26.0–34.0)
MCHC: 34.9 g/dL (ref 30.0–36.0)
MCV: 91.2 fL (ref 80.0–100.0)
Monocytes Absolute: 0.6 10*3/uL (ref 0.1–1.0)
Monocytes Relative: 11 %
Neutro Abs: 3.2 10*3/uL (ref 1.7–7.7)
Neutrophils Relative %: 67 %
Platelet Count: 158 10*3/uL (ref 150–400)
RBC: 3.99 MIL/uL — ABNORMAL LOW (ref 4.22–5.81)
RDW: 14 % (ref 11.5–15.5)
WBC Count: 4.8 10*3/uL (ref 4.0–10.5)
nRBC: 0 % (ref 0.0–0.2)

## 2022-01-30 LAB — CMP (CANCER CENTER ONLY)
ALT: 13 U/L (ref 0–44)
AST: 15 U/L (ref 15–41)
Albumin: 4 g/dL (ref 3.5–5.0)
Alkaline Phosphatase: 80 U/L (ref 38–126)
Anion gap: 5 (ref 5–15)
BUN: 21 mg/dL (ref 8–23)
CO2: 27 mmol/L (ref 22–32)
Calcium: 8.8 mg/dL — ABNORMAL LOW (ref 8.9–10.3)
Chloride: 108 mmol/L (ref 98–111)
Creatinine: 0.87 mg/dL (ref 0.61–1.24)
GFR, Estimated: >60 mL/min (ref >60)
Glucose, Bld: 58 mg/dL — ABNORMAL LOW (ref 70–99)
Potassium: 3.8 mmol/L (ref 3.5–5.1)
Sodium: 140 mmol/L (ref 135–145)
Total Bilirubin: 0.4 mg/dL (ref 0.3–1.2)
Total Protein: 6.7 g/dL (ref 6.5–8.1)

## 2022-01-30 LAB — TSH: TSH: 1.11 u[IU]/mL (ref 0.320–4.118)

## 2022-01-30 MED ORDER — SODIUM CHLORIDE 0.9% FLUSH
10.0000 mL | Freq: Once | INTRAVENOUS | Status: AC
  Administered 2022-01-30: 10 mL

## 2022-01-30 MED ORDER — SODIUM CHLORIDE 0.9% FLUSH
10.0000 mL | INTRAVENOUS | Status: DC | PRN
  Administered 2022-01-30: 10 mL

## 2022-01-30 MED ORDER — SODIUM CHLORIDE 0.9 % IV SOLN
480.0000 mg | Freq: Once | INTRAVENOUS | Status: AC
  Administered 2022-01-30: 480 mg via INTRAVENOUS
  Filled 2022-01-30: qty 48

## 2022-01-30 MED ORDER — SODIUM CHLORIDE 0.9 % IV SOLN: Freq: Once | INTRAVENOUS | Status: AC

## 2022-01-30 MED ORDER — HEPARIN SOD (PORK) LOCK FLUSH 100 UNIT/ML IV SOLN
500.0000 [IU] | Freq: Once | INTRAVENOUS | Status: AC | PRN
  Administered 2022-01-30: 500 [IU]

## 2022-01-30 NOTE — Patient Instructions (Signed)
[facility] MEDICAL ONCOLOGY  Discharge Instructions: ?Thank you for choosing [facility] to provide your oncology and hematology care.  ? ?If you have a lab appointment with the Cancer Center, please go directly to the Cancer Center and check in at the registration area. ?  ?Wear comfortable clothing and clothing appropriate for easy access to any Portacath or PICC line.  ? ?We strive to give you quality time with your provider. You may need to reschedule your appointment if you arrive late (15 or more minutes).  Arriving late affects you and other patients whose appointments are after yours.  Also, if you miss three or more appointments without notifying the office, you may be dismissed from the clinic at the provider?s discretion.    ?  ?For prescription refill requests, have your pharmacy contact our office and allow 72 hours for refills to be completed.   ? ?Today you received the following chemotherapy and/or immunotherapy agents Opdivo    ?  ?To help prevent nausea and vomiting after your treatment, we encourage you to take your nausea medication as directed. ? ?BELOW ARE SYMPTOMS THAT SHOULD BE REPORTED IMMEDIATELY: ?*FEVER GREATER THAN 100.4 F (38 ?C) OR HIGHER ?*CHILLS OR SWEATING ?*NAUSEA AND VOMITING THAT IS NOT CONTROLLED WITH YOUR NAUSEA MEDICATION ?*UNUSUAL SHORTNESS OF BREATH ?*UNUSUAL BRUISING OR BLEEDING ?*URINARY PROBLEMS (pain or burning when urinating, or frequent urination) ?*BOWEL PROBLEMS (unusual diarrhea, constipation, pain near the anus) ?TENDERNESS IN MOUTH AND THROAT WITH OR WITHOUT PRESENCE OF ULCERS (sore throat, sores in mouth, or a toothache) ?UNUSUAL RASH, SWELLING OR PAIN  ?UNUSUAL VAGINAL DISCHARGE OR ITCHING  ? ?Items with * indicate a potential emergency and should be followed up as soon as possible or go to the Emergency Department if any problems should occur. ? ?Please show the CHEMOTHERAPY ALERT CARD or IMMUNOTHERAPY ALERT CARD at check-in to the  Emergency Department and triage nurse. ? ?Should you have questions after your visit or need to cancel or reschedule your appointment, please contact [facility]COL[facility]  Dept: [phone]  and follow the prompts.  Office hours are 8:00 a.m. to 4:30 p.m. Monday - Friday. Please note that voicemails left after 4:00 p.m. may not be returned until the following business day.  We are closed weekends and major holidays. You have access to a nurse at all times for urgent questions. Please call the main number to the clinic Dept: [phone] and follow the prompts. ? ? ?For any non-urgent questions, you may also contact your provider using MyChart. We now offer e-Visits for anyone 18 and older to request care online for non-urgent symptoms. For details visit [email or web address]. ?  ?Also download the MyChart app! Go to the app store, search "MyChart", open the app, select [facility], and log in with your MyChart username and password. ? ?Due to Covid, a mask is required upon entering the hospital/clinic. If you do not have a mask, one will be given to you upon arrival. For doctor visits, patients may have 1 support person aged 18 or older with them. For treatment visits, patients cannot have anyone with them due to current Covid guidelines and our immunocompromised population.  ? ?

## 2022-01-30 NOTE — Progress Notes (Signed)
?[name]   ?[phone] [phone]   ?Clinic Follow up Note  ? ?Patient Care Team: ?[name] as PCP - General (Physician Assistant) ?[name], MD as Consulting Physician (Oncology) ? ?Date of Service:  01/30/2022 ? ?CHIEF COMPLAINT: f/u of esophageal cancer ? ?CURRENT THERAPY:  ?Adjuvant nivolumab, starting 10/10/21, currently q4weeks ? ?ASSESSMENT & PLAN:  ?Nathaniel Jennings is a 66 y.o. male with  ? ?1. 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 EGD performed by Dr. Alessandra Bevels on 04/19/21 showed a partially obstructing tumor in distal esophagus and a polyp in mid esophagus. Biopsy of both confirmed invasive moderately differentiated adenocarcinoma of the esophagus, Her2 negative. ?-PET scan 05/16/21 showed: 6.2 cm distal esophageal mass; hypermetabolic upper right paratracheal lymph node. No other distant metastasis.  ?-EBUS biopsy on 05/26/21, path from 2R lymph node confirmed malignant cells consistent with his primary esophageal cancer. This node is not resectable by surgery  ?-He received concurrent chemoRT with weekly TC 8/2-9/12/22. ?-We changed his chemo to FOLFOX on 06/20/21, at reduced dose for first cycle due to mild neutropenia and concurrent RT. He completed 4 cycles on 08/08/21. ?-he underwent esophagogastrectomy on 09/05/21 under Dr. Kipp Brood. Pathology showed invasive poorly differentiated adenocarcinoma, rare single tumor cells present.  Margins are negative.  Metastatic carcinoma involved 5 out of a total of 14 lymph nodes (5/14).  ?-he began adjuvant nivolumab on 10/10/21. He is tolerating well overall and has moved to every 4 weeks. ?-restaging CT CAP on 12/30/21 showed NED.  Plan to repeat surveillance CT scan in September ?-he is now receiving nivolumab every 4 weeks and tolerating well. Labs reviewed, overall WNL. Will  continue. ?  ?2. Hypermetabolic thyroid nodule ?-seen on PET 05/16/21 (and not on head/neck US on 04/18/21) ?-repeat head/neck US on 06/03/21 showed a 1.1 cm inferior right nodule, corresponding to hypermetabolic focus on PET.  ?-Biopsy on 08/16/21 showed scant follicular epithelium. ?  ?3. HTN, BPH, Eczema  ?-continue medication, will monitor his blood pressure closely during treatment. ?-he has persistent b/l lower leg rash since starting nivolumab. He endorses using Eucerin cream/lotion. I discussed he can use steroid cream once in a while. ?  ?  ?PLAN: ?-proceed with nivolumab today ?-lab, flush, f/u and nivolumab every 4 weeks ? ? ?No problem-specific Assessment & Plan notes found for this encounter. ? ? ?SUMMARY OF ONCOLOGIC HISTORY: ?Oncology History Overview Note  ? Cancer Staging  ?Malignant neoplasm of overlapping sites of esophagus [facility]) ?Staging form: Esophagus - Adenocarcinoma, AJCC 8th Edition ?- Clinical stage from 05/13/2021: Stage IVA (cT3, cN2, cM0) - Signed by [name], MD on 05/22/2021 ? ?  ?Malignant neoplasm of overlapping sites of esophagus [facility])  ?04/18/2021 Imaging  ? ULTRASOUND OF HEAD/NECK SOFT TISSUES ? ?IMPRESSION: ?No suspicious lymphadenopathy ?  ?04/19/2021 Procedure  ? EGD ? ?Impression: ?- Esophageal polyp(s) were found. Biopsied. ?- Partially obstructing, likely malignant esophageal tumor was found in the distal esophagus. ?- Small hiatal hernia. ?- A single gastric polyp. Biopsied. ?- Normal duodenal bulb, first portion of the duodenum and second portion of the duodenum. ?  ?04/19/2021 Pathology Results  ? FINAL MICROSCOPIC DIAGNOSIS: ?Stomach, Biopsy ?- FUNDIC GLAND POLYP. Negative for dysplasia. NO Helicobacter pylori organisms seen on H and E stain. ?Esophagus- Distal, Biopsy ?- INVASIVE MODERATELY DIFFERENTIATED ADENOCARCINOMA OF THE ESOPHAGUS.  ?- HER2 Study by Immunostain: Negative (score 1+) ?Esophagus- Mid, Biopsy ?-  INVASIVE MODERATELY DIFFERENTIATED ADENOCARCINOMA OF THE  ESOPHAGUS. ?- HER2 Study by Immunostain: Negative (score 1+) ?  ?05/05/2021 Imaging  ? CT CAP ? ?IMPRESSION: ?1. The patient's known esophageal mass is not well visualized. There is mild irregular wall thickening of the distal esophagus. Correlate with previous clinical evaluation. ?2. Indeterminate mildly enlarged high right paratracheal node near ?the thoracic inlet. No other mediastinal adenopathy. ?3. No evidence of metastatic disease in the abdomen or pelvis. ?4. Stable small pulmonary nodules bilaterally consistent with benign findings. ?5. Cholelithiasis, small renal cysts and Aortic Atherosclerosis ?(ICD10-I70.0). ?  ?05/09/2021 Initial Diagnosis  ? Malignant neoplasm of overlapping sites of esophagus [facility]) ?  ?05/11/2021 Procedure  ? Upper EUS ? ?Impression: ?- Mucosal nodule found in the esophagus. ?- Partially obstructing, malignant esophageal tumor was found at the gastroesophageal junction. ?- At least 3 discrete peritumoral lymph nodes ?- A mass was found in the gastroesophageal junction. A tissue diagnosis was obtained prior to this exam. This is of adenocarcinoma. This was staged T3 N2 Mx by endosonographic criteria. ?- Unable to traverse GE junction with either EGD scope or radial EUS scope. ?  ?05/13/2021 Cancer Staging  ? Staging form: Esophagus - Adenocarcinoma, AJCC 8th Edition ?- Clinical stage from 05/13/2021: Stage IVA (cT3, cN2, cM0) - Signed by [name], MD on 05/22/2021 ? ?  ?05/16/2021 PET scan  ? IMPRESSION: ?1. Distal esophageal mass measuring about 6.2 cm in length, maximum SUV 13.1. ?2. Mildly hypermetabolic and mildly enlarged upper right paratracheal lymph node, 1.3 cm in short axis with maximum SUV 3.6, concerning for malignant involvement. ?3. Hypodense 1.1 cm right thyroid nodule has a maximum SUV ?substantially above that of the background thyroid. A significant ?minority of such nodules can harbor thyroid cancer. Recommend ?thyroid US and biopsy (ref: J Am Coll Radiol. 2015  Feb;12(2): ?143-50). ?4. 3 by 4 mm right lower lobe pulmonary nodule is similar to ?05/05/2021, not hypermetabolic but below sensitive PET-CT size ?thresholds. Surveillance recommended. ?5. Other imaging findings of potential clinical significance: ?Chronic left maxillary sinusitis. Aortic Atherosclerosis ?(ICD10-I70.0). Cholelithiasis. Prostatomegaly. ?  ?05/24/2021 - 06/13/2021 Chemotherapy  ?  ? ?  ? ?  ?05/26/2021 Pathology Results  ? FINAL MICROSCOPIC DIAGNOSIS:  ? ?A. LYMPH NODE, 2R, FINE NEEDLE ASPIRATION:  ?- Malignant cells consistent with non-small cell carcinoma  ? ?COMMENT:  ?The cells are positive for cytokeratin 20 and CDX-2 (patchy). TTF-1, NapsinA, cytokeratin 5/6, p40, S100, MelanA, and cytokeratin 7 are negative. The patient's history of esophageal adenocarcinoma is noted and CDX-2 positivity is consistent with a gastrointestinal primary. This cytokeratin7/20 profile is usually seen in lower tract tumors, but this pattern can be seen in gastric/esophageal cases. ?  ?05/27/2021 - 07/04/2021 Radiation Therapy  ? Per Dr. Isidore Moos ?  ?06/20/2021 - 08/08/2021 Chemotherapy  ? Patient is on Treatment Plan : GASTROESOPHAGEAL FOLFOX q14d x 6 cycles  ?   ?08/04/2021 PET scan  ? IMPRESSION: ?1. Interval development of a new 6 mm short axis right cervical node with hypermetabolism. Finding is somewhat indeterminate given the apparent response of the hypermetabolic right paratracheal lymphadenopathy seen on the previous study. Close follow-up recommended as metastatic disease not excluded. ?2. Diffuse hypermetabolic FDG accumulation noted in the esophagus today with more diffuse circumferential esophageal wall thickening on today's study than on the previous exam. Hypermetabolism in the distal esophagus at the level of the hypermetabolic neoplasm ?previously has decreased from SUV max = 13 to SUV max = 7 today. ?3. Stable tiny 3-4 mm right  lower lobe and left upper lobe pulmonary nodules. Continued attention on follow-up  recommended. ?4. Cholelithiasis. ?5. Emphysema.  (ICD10-J43.9) ?6.  Aortic Atherosclerois (ICD10-170.0) ?  ?08/17/2021 Pathology Results  ? FINAL MICROSCOPIC DIAGNOSIS:  ?A. LYMPH NODE, RIGHT CERVICAL, FINE NEEDLE

## 2022-01-31 LAB — T4: T4, Total: 6.7 ug/dL (ref 4.5–12.0)

## 2022-02-27 ENCOUNTER — Inpatient Hospital Stay: Payer: BC Managed Care – PPO | Attending: Hematology

## 2022-02-27 ENCOUNTER — Other Ambulatory Visit: Payer: Self-pay

## 2022-02-27 ENCOUNTER — Inpatient Hospital Stay: Payer: BC Managed Care – PPO | Admitting: Hematology

## 2022-02-27 ENCOUNTER — Inpatient Hospital Stay: Payer: BC Managed Care – PPO

## 2022-02-27 VITALS — BP 142/82 | HR 67 | Temp 98.2°F | Resp 18 | Ht 66.0 in | Wt 147.3 lb

## 2022-02-27 DIAGNOSIS — Z79899 Other long term (current) drug therapy: Secondary | ICD-10-CM | POA: Diagnosis not present

## 2022-02-27 DIAGNOSIS — C779 Secondary and unspecified malignant neoplasm of lymph node, unspecified: Secondary | ICD-10-CM | POA: Insufficient documentation

## 2022-02-27 DIAGNOSIS — Z95828 Presence of other vascular implants and grafts: Secondary | ICD-10-CM

## 2022-02-27 DIAGNOSIS — G4730 Sleep apnea, unspecified: Secondary | ICD-10-CM | POA: Diagnosis not present

## 2022-02-27 DIAGNOSIS — D440 Neoplasm of uncertain behavior of thyroid gland: Secondary | ICD-10-CM | POA: Insufficient documentation

## 2022-02-27 DIAGNOSIS — C158 Malignant neoplasm of overlapping sites of esophagus: Secondary | ICD-10-CM | POA: Insufficient documentation

## 2022-02-27 DIAGNOSIS — Z5112 Encounter for antineoplastic immunotherapy: Secondary | ICD-10-CM | POA: Insufficient documentation

## 2022-02-27 LAB — CMP (CANCER CENTER ONLY)
ALT: 14 U/L (ref 0–44)
AST: 16 U/L (ref 15–41)
Albumin: 4.1 g/dL (ref 3.5–5.0)
Alkaline Phosphatase: 82 U/L (ref 38–126)
Anion gap: 4 — ABNORMAL LOW (ref 5–15)
BUN: 17 mg/dL (ref 8–23)
CO2: 28 mmol/L (ref 22–32)
Calcium: 9 mg/dL (ref 8.9–10.3)
Chloride: 108 mmol/L (ref 98–111)
Creatinine: 0.87 mg/dL (ref 0.61–1.24)
GFR, Estimated: >60 mL/min (ref >60)
Glucose, Bld: 73 mg/dL (ref 70–99)
Potassium: 3.6 mmol/L (ref 3.5–5.1)
Sodium: 140 mmol/L (ref 135–145)
Total Bilirubin: 0.6 mg/dL (ref 0.3–1.2)
Total Protein: 7.1 g/dL (ref 6.5–8.1)

## 2022-02-27 LAB — CBC WITH DIFFERENTIAL (CANCER CENTER ONLY)
Abs Immature Granulocytes: 0.01 10*3/uL (ref 0.00–0.07)
Basophils Absolute: 0 10*3/uL (ref 0.0–0.1)
Basophils Relative: 1 %
Eosinophils Absolute: 0.1 10*3/uL (ref 0.0–0.5)
Eosinophils Relative: 3 %
HCT: 37.9 % — ABNORMAL LOW (ref 39.0–52.0)
Hemoglobin: 13 g/dL (ref 13.0–17.0)
Immature Granulocytes: 0 %
Lymphocytes Relative: 21 %
Lymphs Abs: 0.7 10*3/uL (ref 0.7–4.0)
MCH: 31.5 pg (ref 26.0–34.0)
MCHC: 34.3 g/dL (ref 30.0–36.0)
MCV: 91.8 fL (ref 80.0–100.0)
Monocytes Absolute: 0.5 10*3/uL (ref 0.1–1.0)
Monocytes Relative: 13 %
Neutro Abs: 2.1 10*3/uL (ref 1.7–7.7)
Neutrophils Relative %: 62 %
Platelet Count: 122 10*3/uL — ABNORMAL LOW (ref 150–400)
RBC: 4.13 MIL/uL — ABNORMAL LOW (ref 4.22–5.81)
RDW: 13.5 % (ref 11.5–15.5)
WBC Count: 3.4 10*3/uL — ABNORMAL LOW (ref 4.0–10.5)
nRBC: 0 % (ref 0.0–0.2)

## 2022-02-27 LAB — TSH: TSH: 2.105 u[IU]/mL (ref 0.350–4.500)

## 2022-02-27 MED ORDER — SODIUM CHLORIDE 0.9% FLUSH
10.0000 mL | INTRAVENOUS | Status: DC | PRN
  Administered 2022-02-27: 10 mL

## 2022-02-27 MED ORDER — SODIUM CHLORIDE 0.9 % IV SOLN
480.0000 mg | Freq: Once | INTRAVENOUS | Status: AC
  Administered 2022-02-27: 480 mg via INTRAVENOUS
  Filled 2022-02-27: qty 48

## 2022-02-27 MED ORDER — HEPARIN SOD (PORK) LOCK FLUSH 100 UNIT/ML IV SOLN
500.0000 [IU] | Freq: Once | INTRAVENOUS | Status: AC | PRN
  Administered 2022-02-27: 500 [IU]

## 2022-02-27 MED ORDER — SODIUM CHLORIDE 0.9% FLUSH
10.0000 mL | Freq: Once | INTRAVENOUS | Status: AC
  Administered 2022-02-27: 10 mL

## 2022-02-27 MED ORDER — SODIUM CHLORIDE 0.9 % IV SOLN: Freq: Once | INTRAVENOUS | Status: AC

## 2022-02-27 NOTE — Patient Instructions (Signed)
[facility] MEDICAL [facility]COLOGY  Discharge Instructions: ?Thank you for choosing [facility] to provide your oncology and hematology care.  ? ?If you have a lab appointment with the Cancer Center, please go directly to the Cancer Center and check in at the registration area. ?  ?Wear comfortable clothing and clothing appropriate for easy access to any Portacath or PICC line.  ? ?We strive to give you quality time with your provider. You may need to reschedule your appointment if you arrive late (15 or more minutes).  Arriving late affects you and other patients whose appointments are after yours.  Also, if you miss three or more appointments without notifying the office, you may be dismissed from the clinic at the provider?s discretion.    ?  ?For prescription refill requests, have your pharmacy contact our office and allow 72 hours for refills to be completed.   ? ?Today you received the following chemotherapy and/or immunotherapy agents Opdivo    ?  ?To help prevent nausea and vomiting after your treatment, we encourage you to take your nausea medication as directed. ? ?BELOW ARE SYMPTOMS THAT SHOULD BE REPORTED IMMEDIATELY: ?*FEVER GREATER THAN 100.4 F (38 ?C) OR HIGHER ?*CHILLS OR SWEATING ?*NAUSEA AND VOMITING THAT IS NOT CONTROLLED WITH YOUR NAUSEA MEDICATION ?*UNUSUAL SHORTNESS OF BREATH ?*UNUSUAL BRUISING OR BLEEDING ?*URINARY PROBLEMS (pain or burning when urinating, or frequent urination) ?*BOWEL PROBLEMS (unusual diarrhea, constipation, pain near the anus) ?TENDERNESS IN MOUTH AND THROAT WITH OR WITHOUT PRESENCE OF ULCERS (sore throat, sores in mouth, or a toothache) ?UNUSUAL RASH, SWELLING OR PAIN  ?UNUSUAL VAGINAL DISCHARGE OR ITCHING  ? ?Items with * indicate a potential emergency and should be followed up as soon as possible or go to the Emergency Department if any problems should occur. ? ?Please show the CHEMOTHERAPY ALERT CARD or IMMUNOTHERAPY ALERT CARD at check-in to the  Emergency Department and triage nurse. ? ?Should you have questions after your visit or need to cancel or reschedule your appointment, please contact [facility]  Dept: [phone]  and follow the prompts.  Office hours are 8:00 a.m. to 4:30 p.m. Monday - Friday. Please note that voicemails left after 4:00 p.m. may not be returned until the following business day.  We are closed weekends and major holidays. You have access to a nurse at all times for urgent questions. Please call the main number to the clinic Dept: [phone] and follow the prompts. ? ? ?For any non-urgent questions, you may also contact your provider using MyChart. We now offer e-Visits for anyone 18 and older to request care online for non-urgent symptoms. For details visit [email or web address]. ?  ?Also download the MyChart app! Go to the app store, search "MyChart", open the app, select , and log in with your MyChart username and password. ? ?Due to Covid, a mask is required upon entering the hospital/clinic. If you do not have a mask, one will be given to you upon arrival. For doctor visits, patients may have 1 support person aged 18 or older with them. For treatment visits, patients cannot have anyone with them due to current Covid guidelines and our immunocompromised population.  ? ?

## 2022-02-27 NOTE — Progress Notes (Addendum)
[place]   [phone] [phone]   Clinic Follow up Note   Patient Care Team: Cyndi Bender, Hershal Coria as PCP - General (Physician Assistant) [name], MD as Consulting Physician (Oncology)  Date of Service:  02/27/2022  CHIEF COMPLAINT: f/u of esophageal cancer  CURRENT THERAPY:  Adjuvant nivolumab, starting 10/10/21, currently q4weeks  ASSESSMENT & PLAN:  Nathaniel Jennings is a 66 y.o. male with   1. 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 EGD performed by Dr. Alessandra Bevels on 04/19/21 showed a partially obstructing tumor in distal esophagus and a polyp in mid esophagus. Biopsy of both confirmed invasive moderately differentiated adenocarcinoma of the esophagus, Her2 negative. -PET scan 05/16/21 showed: 6.2 cm distal esophageal mass; hypermetabolic upper right paratracheal lymph node. No other distant metastasis.  -EBUS biopsy on 05/26/21, path from 2R lymph node confirmed malignant cells consistent with his primary esophageal cancer. This node is not resectable by surgery  -He received concurrent chemoRT with weekly TC 05/24/21 - 07/04/21. -We changed his chemo to FOLFOX on 06/20/21, at reduced dose for first cycle due to mild neutropenia and concurrent RT. He completed 4 cycles on 08/08/21.  Posttreatment PET scan showed excellent response. -he underwent esophagogastrectomy on 09/05/21 under Dr. Kipp Brood. Pathology showed invasive poorly differentiated adenocarcinoma, rare single tumor cells present.  Margins are negative.  Metastatic carcinoma involved 5 out of a total of 14 lymph nodes (5/14).  -he began adjuvant nivolumab on 10/10/21. He is tolerating well overall and has moved to every 4 weeks. -restaging CT CAP on 12/30/21 showed NED.  Plan to repeat surveillance CT scan or PET in September, or sooner if needed  -he is now  receiving nivolumab every 4 weeks and tolerating well, except mild acid reflux. Labs reviewed, overall stable. Will continue.   2. Hypermetabolic thyroid nodule -seen on PET 05/16/21 (and not on head/neck US on 04/18/21) -repeat head/neck US on 06/03/21 showed a 1.1 cm inferior right nodule, corresponding to hypermetabolic focus on PET.  -Biopsy on 08/16/21 showed scant follicular epithelium.   3. HTN, BPH, Eczema  -continue medication, will monitor his blood pressure closely during treatment. -he has persistent b/l lower leg rash since starting nivolumab. He endorses using Eucerin cream/lotion. I discussed he can use steroid cream once in a while.     PLAN: -proceed with nivolumab today -lab, flush, f/u and nivolumab every 4 weeks    No problem-specific Assessment & Plan notes found for this encounter.   SUMMARY OF ONCOLOGIC HISTORY: Oncology History Overview Note   Cancer Staging  Malignant neoplasm of overlapping sites of esophagus [facility]) Staging form: Esophagus - Adenocarcinoma, AJCC 8th Edition - Clinical stage from 05/13/2021: Stage IVA (cT3, cN2, cM0) - Signed by [name], MD on 05/22/2021    Malignant neoplasm of overlapping sites of esophagus ([place])  04/18/2021 Imaging   ULTRASOUND OF HEAD/NECK SOFT TISSUES  IMPRESSION: No suspicious lymphadenopathy   04/19/2021 Procedure   EGD  Impression: - Esophageal polyp(s) were found. Biopsied. - Partially obstructing, likely malignant esophageal tumor was found in the distal esophagus. - Small hiatal hernia. - A single gastric polyp. Biopsied. - Normal duodenal bulb, first portion of the duodenum and second portion of the duodenum.   04/19/2021 Pathology Results   FINAL MICROSCOPIC DIAGNOSIS: Stomach, Biopsy - FUNDIC GLAND POLYP. Negative for dysplasia. NO Helicobacter pylori organisms seen on H and E stain. Esophagus- Distal,  Biopsy - INVASIVE MODERATELY DIFFERENTIATED ADENOCARCINOMA OF THE ESOPHAGUS.  - HER2 Study by  Immunostain: Negative (score 1+) Esophagus- Mid, Biopsy - INVASIVE MODERATELY DIFFERENTIATED ADENOCARCINOMA OF THE ESOPHAGUS. - HER2 Study by Immunostain: Negative (score 1+)   05/05/2021 Imaging   CT CAP  IMPRESSION: 1. The patient's known esophageal mass is not well visualized. There is mild irregular wall thickening of the distal esophagus. Correlate with previous clinical evaluation. 2. Indeterminate mildly enlarged high right paratracheal node near the thoracic inlet. No other mediastinal adenopathy. 3. No evidence of metastatic disease in the abdomen or pelvis. 4. Stable small pulmonary nodules bilaterally consistent with benign findings. 5. Cholelithiasis, small renal cysts and Aortic Atherosclerosis (ICD10-I70.0).   05/09/2021 Initial Diagnosis   Malignant neoplasm of overlapping sites of esophagus ([place])    05/11/2021 Procedure   Upper EUS  Impression: - Mucosal nodule found in the esophagus. - Partially obstructing, malignant esophageal tumor was found at the gastroesophageal junction. - At least 3 discrete peritumoral lymph nodes - A mass was found in the gastroesophageal junction. A tissue diagnosis was obtained prior to this exam. This is of adenocarcinoma. This was staged T3 N2 Mx by endosonographic criteria. - Unable to traverse GE junction with either EGD scope or radial EUS scope.   05/13/2021 Cancer Staging   Staging form: Esophagus - Adenocarcinoma, AJCC 8th Edition - Clinical stage from 05/13/2021: Stage IVA (cT3, cN2, cM0) - Signed by [name], MD on 05/22/2021    05/16/2021 PET scan   IMPRESSION: 1. Distal esophageal mass measuring about 6.2 cm in length, maximum SUV 13.1. 2. Mildly hypermetabolic and mildly enlarged upper right paratracheal lymph node, 1.3 cm in short axis with maximum SUV 3.6, concerning for malignant involvement. 3. Hypodense 1.1 cm right thyroid nodule has a maximum SUV substantially above that of the background thyroid. A  significant minority of such nodules can harbor thyroid cancer. Recommend thyroid US and biopsy (ref: J Am Coll Radiol. 2015 Feb;12(2): 143-50). 4. 3 by 4 mm right lower lobe pulmonary nodule is similar to 05/05/2021, not hypermetabolic but below sensitive PET-CT size thresholds. Surveillance recommended. 5. Other imaging findings of potential clinical significance: Chronic left maxillary sinusitis. Aortic Atherosclerosis (ICD10-I70.0). Cholelithiasis. Prostatomegaly.   05/24/2021 - 06/13/2021 Chemotherapy          05/26/2021 Pathology Results   FINAL MICROSCOPIC DIAGNOSIS:   A. LYMPH NODE, 2R, FINE NEEDLE ASPIRATION:  - Malignant cells consistent with non-small cell carcinoma   COMMENT:  The cells are positive for cytokeratin 20 and CDX-2 (patchy). TTF-1, NapsinA, cytokeratin 5/6, p40, S100, MelanA, and cytokeratin 7 are negative. The patient's history of esophageal adenocarcinoma is noted and CDX-2 positivity is consistent with a gastrointestinal primary. This cytokeratin7/20 profile is usually seen in lower tract tumors, but this pattern can be seen in gastric/esophageal cases.   05/27/2021 - 07/04/2021 Radiation Therapy   Per Dr. Isidore Moos   06/20/2021 - 08/08/2021 Chemotherapy   Patient is on Treatment Plan : GASTROESOPHAGEAL FOLFOX q14d x 6 cycles       08/04/2021 PET scan   IMPRESSION: 1. Interval development of a new 6 mm short axis right cervical node with hypermetabolism. Finding is somewhat indeterminate given the apparent response of the hypermetabolic right paratracheal lymphadenopathy seen on the previous study. Close follow-up recommended as metastatic disease not excluded. 2. Diffuse hypermetabolic FDG accumulation noted in the esophagus today with more diffuse circumferential esophageal wall thickening on today's study than on the previous exam. Hypermetabolism in the distal esophagus at the  level of the hypermetabolic neoplasm previously has decreased from SUV max = 13 to  SUV max = 7 today. 3. Stable tiny 3-4 mm right lower lobe and left upper lobe pulmonary nodules. Continued attention on follow-up recommended. 4. Cholelithiasis. 5. Emphysema.  (ICD10-J43.9) 6.  Aortic Atherosclerois (ICD10-170.0)   08/17/2021 Pathology Results   FINAL MICROSCOPIC DIAGNOSIS:  A. LYMPH NODE, RIGHT CERVICAL, FINE NEEDLE ASPIRATION:  - No malignant cells identified  - See comment   COMMENT:  There is scant cellularity and the specimen may not be representative.    09/05/2021 Definitive Surgery   FINAL MICROSCOPIC DIAGNOSIS:   A. ESOPHAGOGASTRECTOMY:  - Invasive poorly differentiated adenocarcinoma.  - Metastatic carcinoma involving 5 of 13 lymph nodes (5/13).  - See oncology table below.   B. LYMPH NODE, LEVEL 8, EXCISION:  - One lymph node, negative for malignancy (0/1).   09/05/2021 Cancer Staging   Staging form: Esophagus - Adenocarcinoma, AJCC 8th Edition - Pathologic stage from 09/05/2021: Stage IIIB (ypT3, pN2, cM0, G3) - Signed by [name], MD on 12/03/2021 Stage prefix: Post-therapy Response to neoadjuvant therapy: Partial response Histologic grading system: 3 grade system Residual tumor (R): R0 - None    10/10/2021 -  Chemotherapy   Patient is on Treatment Plan : GASTROESOPHAGEAL Nivolumab q14d x 8 cycles / Nivolumab q28d       12/30/2021 Imaging   EXAM: CT CHEST, ABDOMEN, AND PELVIS WITH CONTRAST  IMPRESSION: Status post esophagectomy with gastric pull-through procedure. No evidence of recurrent or metastatic carcinoma within the chest, abdomen, or pelvis.   Cholelithiasis. No radiographic evidence of cholecystitis.   Stable enlarged prostate.   Aortic Atherosclerosis (ICD10-I70.0).      INTERVAL HISTORY:  Nathaniel Jennings is here for a follow up of esophageal cancer. He was last seen by me on 01/30/22. He presents to the clinic accompanied by his wife.  He is doing well overall, does have mild to moderate acid reflux, he is on Protonix  40 mg twice daily.  His eczema is stable, no other new concerns.  He is tolerating nivolumab without major side effects.  He has good appetite and energy level, weight is stable, he continues working full-time.   All other systems were reviewed with the patient and are negative.  MEDICAL HISTORY:  Past Medical History:  Diagnosis Date   Arthritis    BPH (benign prostatic hyperplasia)    Dyslipidemia    Elevated PSA    Esophageal cancer ([place])    Full dentures    GERD (gastroesophageal reflux disease)    History of melanoma excision    07/ 2011  s/p  wide excision left back--- per pathology-- negative maligant --  mild melanocytic atypia   History of palpitations    cardiologist --  dr Shelva Majestic (last note in epic 2014   Hypertension    Sleep apnea     SURGICAL HISTORY: Past Surgical History:  Procedure Laterality Date   COLONOSCOPY  10/24/2007   ESOPHAGOGASTRODUODENOSCOPY N/A 09/05/2021   Procedure: ESOPHAGOGASTRODUODENOSCOPY (EGD);  Surgeon: Lajuana Matte, MD;  Location[facility];  Service: Thoracic;  Laterality: N/A;   ESOPHAGOGASTRODUODENOSCOPY (EGD) WITH PROPOFOL N/A 05/11/2021   Procedure: ESOPHAGOGASTRODUODENOSCOPY (EGD) WITH PROPOFOL;  Surgeon: Arta Silence, MD;  Location: [facility] END[facility]COPY;  Service: Endoscopy;  Laterality: N/A;   EUS N/A 05/11/2021   Procedure: ESOPHAGEAL ENDOSCOPIC ULTRASOUND (EUS) RADIAL;  Surgeon: Arta Silence, MD;  Location: [facility];  Service: Endoscopy;  Laterality: N/A;   EYE SURGERY  Lasik surgery on both eyes, but still needs glasses   FINE NEEDLE ASPIRATION  05/26/2021   Procedure: FINE NEEDLE ASPIRATION (FNA) LINEAR;  Surgeon: Garner Nash, DO;  Location: [facility];  Service: Pulmonary;;   INTERCOSTAL NERVE BLOCK  09/05/2021   Procedure: INTERCOSTAL NERVE BLOCK;  Surgeon: Lajuana Matte, MD;  Location: [place];  Service: Thoracic;;   IR IMAGING GUIDED PORT INSERTION  06/30/2021   JEJUNOSTOMY  09/05/2021   Procedure:  Shanon Rosser;  Surgeon: Lajuana Matte, MD;  Location: [place];  Service: Thoracic;;   PROSTATE BIOPSY N/A 07/02/2015   Procedure: BIOPSY TRANSRECTAL ULTRASONIC PROSTATE (TUBP);  Surgeon: Lowella Bandy, MD;  Location: [facility];  Service: Urology;  Laterality: N/A;   PROSTATE BIOPSY N/A 11/22/2015   Procedure: BIOPSY TRANSRECTAL ULTRASONIC PROSTATE (TUBP);  Surgeon: Cleon Gustin, MD;  Location: [facility];  Service: Urology;  Laterality: N/A;   TRANSTHORACIC ECHOCARDIOGRAM  11/19/2012   normal echo/  trivial TR   VIDEO BRONCHOSCOPY WITH ENDOBRONCHIAL ULTRASOUND N/A 05/26/2021   Procedure: VIDEO BRONCHOSCOPY WITH ENDOBRONCHIAL ULTRASOUND;  Surgeon: Garner Nash, DO;  Location: [place];  Service: Pulmonary;  Laterality: N/A;   WIDE EXCISION MELANOMA LEFT BACK  05/02/2010    I have reviewed the social history and family history with the patient and they are unchanged from previous note.  ALLERGIES:  is allergic to bee venom.  MEDICATIONS:  Current Outpatient Medications  Medication Sig Dispense Refill   aspirin EC 81 MG tablet Take 81 mg by mouth in the morning.     ketoconazole (NIZORAL) 2 % cream Apply 1 application topically daily. 15 g 0   lidocaine-prilocaine (EMLA) cream Apply 1 application topically as needed. 30 g 0   metoprolol succinate (TOPROL-XL) 50 MG 24 hr tablet Take 50 mg by mouth daily. Take with or immediately following a meal.     pantoprazole (PROTONIX) 40 MG tablet Take 1 tablet (40 mg total) by mouth 2 (two) times daily. 30 tablet 1   Skin Protectants, Misc. (EUCERIN) cream Apply topically as needed for dry skin.     No current facility-administered medications for this visit.    PHYSICAL EXAMINATION: ECOG PERFORMANCE STATUS: 0 - Asymptomatic  Vitals:   02/27/22 1227  BP: (!) 142/82  Pulse: 67  Resp: 18  Temp: 98.2 F (36.8 C)  SpO2: 100%   Wt Readings from Last 3 Encounters:  02/27/22 147 lb 4.8 oz (66.8 kg)   01/30/22 147 lb 3.2 oz (66.8 kg)  01/02/22 145 lb 8 oz (66 kg)     GENERAL:alert, no distress and comfortable SKIN: skin color, texture, turgor are normal, no rashes or significant lesions EYES: normal, Conjunctiva are pink and non-injected, sclera clear NECK: supple, thyroid normal size, non-tender, without nodularity LYMPH:  no palpable lymphadenopathy in the cervical, axillary  LUNGS: clear to auscultation and percussion with normal breathing effort HEART: regular rate & rhythm and no murmurs and no lower extremity edema ABDOMEN:abdomen soft, non-tender and normal bowel sounds Musculoskeletal:no cyanosis of digits and no clubbing  NEURO: alert & oriented x 3 with fluent speech, no focal motor/sensory deficits  LABORATORY DATA:  I have reviewed the data as listed    Latest Ref Rng & Units 02/27/2022   11:41 AM 01/30/2022    2:02 PM 12/30/2021   10:00 AM  CBC  WBC 4.0 - 10.5 K/uL 3.4   4.8   4.4    Hemoglobin 13.0 - 17.0 g/dL 13.0  12.7   13.3    Hematocrit 39.0 - 52.0 % 37.9   36.4   39.0    Platelets 150 - 400 K/uL 122   158   167          Latest Ref Rng & Units 02/27/2022   11:41 AM 01/30/2022    2:02 PM 12/30/2021   10:00 AM  CMP  Glucose 70 - 99 mg/dL 73   58   104    BUN 8 - 23 mg/dL _0 Creatinine 0.61 - 1.24 mg/dL 0.87   0.87   0.75    Sodium 135 - 145 mmol/L 140   140   137    Potassium 3.5 - 5.1 mmol/L 3.6   3.8   4.0    Chloride 98 - 111 mmol/L 108   108   103    CO2 22 - 32 mmol/L _1 Calcium 8.9 - 10.3 mg/dL 9.0   8.8   9.4    Total Protein 6.5 - 8.1 g/dL 7.1   6.7   7.0    Total Bilirubin 0.3 - 1.2 mg/dL 0.6   0.4   0.9    Alkaline Phos 38 - 126 U/L 82   80   86    AST 15 - 41 U/L _2 ALT 0 - 44 U/L _3 RADIOGRAPHIC STUDIES: I have personally reviewed the radiological images as listed and agreed with the findings in the report. No results found.    No orders of the defined types were placed in this  encounter.  All questions were answered. The patient knows to call the clinic with any problems, questions or concerns. No barriers to learning was detected. The total time spent in the appointment was 20 minutes.     [name], MD 02/27/2022   I, Wilburn Mylar, am acting as scribe for [name], MD.   I have reviewed the above documentation for accuracy and completeness, and I agree with the above.

## 2022-02-28 ENCOUNTER — Encounter: Payer: Self-pay | Admitting: Hematology

## 2022-02-28 LAB — T4: T4, Total: 6.3 ug/dL (ref 4.5–12.0)

## 2022-03-03 ENCOUNTER — Inpatient Hospital Stay (HOSPITAL_BASED_OUTPATIENT_CLINIC_OR_DEPARTMENT_OTHER): Payer: BC Managed Care – PPO | Admitting: Physician Assistant

## 2022-03-03 ENCOUNTER — Inpatient Hospital Stay: Payer: BC Managed Care – PPO

## 2022-03-03 ENCOUNTER — Other Ambulatory Visit: Payer: Self-pay

## 2022-03-03 ENCOUNTER — Emergency Department (HOSPITAL_COMMUNITY): Payer: BC Managed Care – PPO

## 2022-03-03 ENCOUNTER — Inpatient Hospital Stay (HOSPITAL_COMMUNITY)
Admission: EM | Admit: 2022-03-03 | Discharge: 2022-03-05 | DRG: 442 | Disposition: A | Discharge status: Home / Self Care | Payer: BC Managed Care – PPO | Attending: Family Medicine | Admitting: Family Medicine

## 2022-03-03 ENCOUNTER — Encounter: Payer: Self-pay | Admitting: Hematology

## 2022-03-03 ENCOUNTER — Encounter (HOSPITAL_COMMUNITY): Payer: Self-pay

## 2022-03-03 VITALS — BP 131/75 | HR 70 | Temp 98.7°F | Resp 18

## 2022-03-03 VITALS — BP 131/75 | HR 70 | Temp 98.7°F | Resp 18 | Wt 147.1 lb

## 2022-03-03 DIAGNOSIS — Z7960 Long term (current) use of unspecified immunomodulators and immunosuppressants: Secondary | ICD-10-CM | POA: Diagnosis not present

## 2022-03-03 DIAGNOSIS — R6510 Systemic inflammatory response syndrome (SIRS) of non-infectious origin without acute organ dysfunction: Secondary | ICD-10-CM | POA: Diagnosis not present

## 2022-03-03 DIAGNOSIS — R7401 Elevation of levels of liver transaminase levels: Secondary | ICD-10-CM | POA: Diagnosis present

## 2022-03-03 DIAGNOSIS — E860 Dehydration: Secondary | ICD-10-CM

## 2022-03-03 DIAGNOSIS — Z91030 Bee allergy status: Secondary | ICD-10-CM | POA: Diagnosis not present

## 2022-03-03 DIAGNOSIS — E876 Hypokalemia: Secondary | ICD-10-CM | POA: Diagnosis present

## 2022-03-03 DIAGNOSIS — N400 Enlarged prostate without lower urinary tract symptoms: Secondary | ICD-10-CM | POA: Diagnosis present

## 2022-03-03 DIAGNOSIS — G4730 Sleep apnea, unspecified: Secondary | ICD-10-CM | POA: Diagnosis present

## 2022-03-03 DIAGNOSIS — Z923 Personal history of irradiation: Secondary | ICD-10-CM | POA: Diagnosis not present

## 2022-03-03 DIAGNOSIS — Z85820 Personal history of malignant melanoma of skin: Secondary | ICD-10-CM

## 2022-03-03 DIAGNOSIS — D630 Anemia in neoplastic disease: Secondary | ICD-10-CM | POA: Diagnosis not present

## 2022-03-03 DIAGNOSIS — R17 Unspecified jaundice: Secondary | ICD-10-CM | POA: Diagnosis not present

## 2022-03-03 DIAGNOSIS — Z8546 Personal history of malignant neoplasm of prostate: Secondary | ICD-10-CM

## 2022-03-03 DIAGNOSIS — B999 Unspecified infectious disease: Secondary | ICD-10-CM

## 2022-03-03 DIAGNOSIS — K219 Gastro-esophageal reflux disease without esophagitis: Secondary | ICD-10-CM | POA: Diagnosis present

## 2022-03-03 DIAGNOSIS — Z841 Family history of disorders of kidney and ureter: Secondary | ICD-10-CM | POA: Diagnosis not present

## 2022-03-03 DIAGNOSIS — Y929 Unspecified place or not applicable: Secondary | ICD-10-CM | POA: Diagnosis not present

## 2022-03-03 DIAGNOSIS — I10 Essential (primary) hypertension: Secondary | ICD-10-CM | POA: Diagnosis not present

## 2022-03-03 DIAGNOSIS — I700 Atherosclerosis of aorta: Secondary | ICD-10-CM | POA: Diagnosis not present

## 2022-03-03 DIAGNOSIS — C158 Malignant neoplasm of overlapping sites of esophagus: Secondary | ICD-10-CM

## 2022-03-03 DIAGNOSIS — Z87891 Personal history of nicotine dependence: Secondary | ICD-10-CM

## 2022-03-03 DIAGNOSIS — D849 Immunodeficiency, unspecified: Secondary | ICD-10-CM | POA: Diagnosis present

## 2022-03-03 DIAGNOSIS — K8020 Calculus of gallbladder without cholecystitis without obstruction: Secondary | ICD-10-CM | POA: Diagnosis not present

## 2022-03-03 DIAGNOSIS — R509 Fever, unspecified: Secondary | ICD-10-CM | POA: Diagnosis present

## 2022-03-03 DIAGNOSIS — E869 Volume depletion, unspecified: Secondary | ICD-10-CM | POA: Diagnosis present

## 2022-03-03 DIAGNOSIS — R109 Unspecified abdominal pain: Secondary | ICD-10-CM | POA: Diagnosis not present

## 2022-03-03 DIAGNOSIS — T451X5A Adverse effect of antineoplastic and immunosuppressive drugs, initial encounter: Secondary | ICD-10-CM | POA: Diagnosis present

## 2022-03-03 DIAGNOSIS — C159 Malignant neoplasm of esophagus, unspecified: Secondary | ICD-10-CM | POA: Diagnosis not present

## 2022-03-03 DIAGNOSIS — Z95828 Presence of other vascular implants and grafts: Secondary | ICD-10-CM

## 2022-03-03 DIAGNOSIS — E785 Hyperlipidemia, unspecified: Secondary | ICD-10-CM | POA: Diagnosis present

## 2022-03-03 LAB — CBC WITH DIFFERENTIAL (CANCER CENTER ONLY)
Abs Immature Granulocytes: 0.03 10*3/uL (ref 0.00–0.07)
Basophils Absolute: 0 10*3/uL (ref 0.0–0.1)
Basophils Relative: 0 %
Eosinophils Absolute: 0.1 10*3/uL (ref 0.0–0.5)
Eosinophils Relative: 1 %
HCT: 36.3 % — ABNORMAL LOW (ref 39.0–52.0)
Hemoglobin: 12.7 g/dL — ABNORMAL LOW (ref 13.0–17.0)
Immature Granulocytes: 0 %
Lymphocytes Relative: 5 %
Lymphs Abs: 0.4 10*3/uL — ABNORMAL LOW (ref 0.7–4.0)
MCH: 31.9 pg (ref 26.0–34.0)
MCHC: 35 g/dL (ref 30.0–36.0)
MCV: 91.2 fL (ref 80.0–100.0)
Monocytes Absolute: 0.8 10*3/uL (ref 0.1–1.0)
Monocytes Relative: 8 %
Neutro Abs: 8.2 10*3/uL — ABNORMAL HIGH (ref 1.7–7.7)
Neutrophils Relative %: 86 %
Platelet Count: 142 10*3/uL — ABNORMAL LOW (ref 150–400)
RBC: 3.98 MIL/uL — ABNORMAL LOW (ref 4.22–5.81)
RDW: 14 % (ref 11.5–15.5)
WBC Count: 9.5 10*3/uL (ref 4.0–10.5)
nRBC: 0 % (ref 0.0–0.2)

## 2022-03-03 LAB — CMP (CANCER CENTER ONLY)
ALT: 135 U/L — ABNORMAL HIGH (ref 0–44)
AST: 136 U/L — ABNORMAL HIGH (ref 15–41)
Albumin: 4 g/dL (ref 3.5–5.0)
Alkaline Phosphatase: 133 U/L — ABNORMAL HIGH (ref 38–126)
Anion gap: 5 (ref 5–15)
BUN: 17 mg/dL (ref 8–23)
CO2: 29 mmol/L (ref 22–32)
Calcium: 8.9 mg/dL (ref 8.9–10.3)
Chloride: 103 mmol/L (ref 98–111)
Creatinine: 0.97 mg/dL (ref 0.61–1.24)
GFR, Estimated: >60 mL/min (ref >60)
Glucose, Bld: 101 mg/dL — ABNORMAL HIGH (ref 70–99)
Potassium: 3.6 mmol/L (ref 3.5–5.1)
Sodium: 137 mmol/L (ref 135–145)
Total Bilirubin: 3.9 mg/dL (ref 0.3–1.2)
Total Protein: 6.7 g/dL (ref 6.5–8.1)

## 2022-03-03 LAB — URINALYSIS, COMPLETE (UACMP) WITH MICROSCOPIC
Bacteria, UA: NONE SEEN
Glucose, UA: NEGATIVE mg/dL
Hgb urine dipstick: NEGATIVE
Ketones, ur: NEGATIVE mg/dL
Leukocytes,Ua: NEGATIVE
Nitrite: NEGATIVE
Protein, ur: NEGATIVE mg/dL
Specific Gravity, Urine: 1.02 (ref 1.005–1.030)
pH: 5 (ref 5.0–8.0)

## 2022-03-03 LAB — URINALYSIS, ROUTINE W REFLEX MICROSCOPIC
Bilirubin Urine: NEGATIVE
Glucose, UA: NEGATIVE mg/dL
Hgb urine dipstick: NEGATIVE
Ketones, ur: 20 mg/dL — AB
Leukocytes,Ua: NEGATIVE
Nitrite: NEGATIVE
Protein, ur: NEGATIVE mg/dL
Specific Gravity, Urine: 1.006 (ref 1.005–1.030)
pH: 6 (ref 5.0–8.0)

## 2022-03-03 LAB — LACTIC ACID, PLASMA
Lactic Acid, Venous: 1.4 mmol/L (ref 0.5–1.9)
Lactic Acid, Venous: 0.8 mmol/L (ref 0.5–1.9)

## 2022-03-03 LAB — TSH: TSH: 1.752 u[IU]/mL (ref 0.350–4.500)

## 2022-03-03 MED ORDER — VANCOMYCIN HCL 1500 MG/300ML IV SOLN
1500.0000 mg | INTRAVENOUS | Status: DC
  Filled 2022-03-03: qty 300

## 2022-03-03 MED ORDER — VANCOMYCIN HCL 1500 MG/300ML IV SOLN
1500.0000 mg | Freq: Once | INTRAVENOUS | Status: AC
  Administered 2022-03-03: 1500 mg via INTRAVENOUS
  Filled 2022-03-03: qty 300

## 2022-03-03 MED ORDER — SODIUM CHLORIDE 0.9% FLUSH
10.0000 mL | Freq: Once | INTRAVENOUS | Status: AC
  Administered 2022-03-03: 10 mL

## 2022-03-03 MED ORDER — SODIUM CHLORIDE 0.9 % IV SOLN
2.0000 g | Freq: Three times a day (TID) | INTRAVENOUS | Status: DC
  Administered 2022-03-03 – 2022-03-04 (×3): 2 g via INTRAVENOUS
  Filled 2022-03-03 (×3): qty 12.5

## 2022-03-03 MED ORDER — SODIUM CHLORIDE 0.9 % IV SOLN: Freq: Once | INTRAVENOUS | Status: AC

## 2022-03-03 MED ORDER — IOHEXOL 300 MG/ML  SOLN
100.0000 mL | Freq: Once | INTRAMUSCULAR | Status: AC | PRN
  Administered 2022-03-03: 100 mL via INTRAVENOUS

## 2022-03-03 MED ORDER — SODIUM CHLORIDE 0.9 % IV SOLN: INTRAVENOUS | Status: DC

## 2022-03-03 MED ORDER — METOPROLOL SUCCINATE ER 50 MG PO TB24
50.0000 mg | ORAL_TABLET | Freq: Every day | ORAL | Status: DC
  Filled 2022-03-03 (×2): qty 1

## 2022-03-03 MED ORDER — SODIUM CHLORIDE 0.9 % IV SOLN
2.0000 g | Freq: Once | INTRAVENOUS | Status: AC
  Administered 2022-03-03: 2 g via INTRAVENOUS
  Filled 2022-03-03: qty 12.5

## 2022-03-03 MED ORDER — PANTOPRAZOLE SODIUM 40 MG PO TBEC
40.0000 mg | DELAYED_RELEASE_TABLET | Freq: Two times a day (BID) | ORAL | Status: DC
  Administered 2022-03-04: 40 mg via ORAL
  Filled 2022-03-03 (×4): qty 1

## 2022-03-03 MED ORDER — HEPARIN SODIUM (PORCINE) 5000 UNIT/ML IJ SOLN
5000.0000 [IU] | Freq: Three times a day (TID) | INTRAMUSCULAR | Status: DC
  Administered 2022-03-03 – 2022-03-04 (×4): 5000 [IU] via SUBCUTANEOUS
  Filled 2022-03-03 (×5): qty 1

## 2022-03-03 MED ORDER — SODIUM CHLORIDE (PF) 0.9 % IJ SOLN
INTRAMUSCULAR | Status: AC
  Filled 2022-03-03: qty 50

## 2022-03-03 MED ORDER — ASPIRIN EC 81 MG PO TBEC
81.0000 mg | DELAYED_RELEASE_TABLET | Freq: Every morning | ORAL | Status: DC
  Filled 2022-03-03 (×2): qty 1

## 2022-03-03 NOTE — Progress Notes (Signed)
Pharmacy Antibiotic Note ? ?Nathaniel Jennings is a 66 y.o. male admitted on 03/03/2022 with fever. Patient sent from cancer center. He has h/o esophageal cancer s/p esophagectomy. Pharmacy has been consulted for vancomycin and cefepime dosing. ? ?Plan: ?Vancomycin 1500 mg q24h ?Cefepime 2 g IV q8h ? ?Continue to follow renal function, cultures and clinical progress for antibiotic dosage adjustments and de-escalation as indicated. ? ?Height: '5\' 6"'$  (167.6 cm) ?Weight: 66.7 kg (147 lb 0.9 oz) ?IBW/kg (Calculated) : 63.8 ? ?Temp (24hrs), Avg:98.6 ?F (37 ?C), Min:98.5 ?F (36.9 ?C), Max:98.7 ?F (37.1 ?C) ? ?Recent Labs  ?Lab 02/27/22 ?1141 03/03/22 ?1141 03/03/22 ?1446  ?WBC 3.4* 9.5  --   ?CREATININE 0.87 0.97  --   ?LATICACIDVEN  --   --  1.4  ?  ?Estimated Creatinine Clearance: 68.5 mL/min (by C-G formula based on SCr of 0.97 mg/dL).   ? ?Allergies  ?Allergen Reactions  ? Bee Venom Anaphylaxis  ? ? ?Antimicrobials this admission: ?Cefepime 5/12 >> ?Vancomycin 5/12 >> ? ?Dose adjustments this admission: NA ? ?Microbiology results: ?5/12 BCx: pending ?5/12 UCx: ordered  ? ? ?Thank you for allowing pharmacy to be a part of this patient?s care. ? ?Tawnya Crook, PharmD, BCPS ?Clinical Pharmacist ?03/03/2022 5:57 PM ? ? ?

## 2022-03-03 NOTE — ED Triage Notes (Signed)
Pt brought from cancer center. Pt developed fever yesterday and now has elevated bilirubin. Pt has hx of esophageal cancer.  ? ?1050m NS ?Post accessed ?

## 2022-03-03 NOTE — Progress Notes (Signed)
? ? ? ?Symptom Management Consult note ?[place]   ? ?Patient Care Team: ?[name] as PCP - General (Physician Assistant) ?[name], MD as Consulting Physician (Oncology)  ? ? ?Name of the patient: Nathaniel Jennings  [id number]  03/27/1956  ? ?Date of visit: 03/03/2022  ? ? ?Chief complaint/ Reason for visit- fever ? ?Oncology History Overview Note  ? Cancer Staging  ?Malignant neoplasm of overlapping sites of esophagus [facility]) ?Staging form: Esophagus - Adenocarcinoma, AJCC 8th Edition ?- Clinical stage from 05/13/2021: Stage IVA (cT3, cN2, cM0) - Signed by [name], MD on 05/22/2021 ? ?  ?Malignant neoplasm of overlapping sites of esophagus [facility])  ?04/18/2021 Imaging  ? ULTRASOUND OF HEAD/NECK SOFT TISSUES ? ?IMPRESSION: ?No suspicious lymphadenopathy ?  ?04/19/2021 Procedure  ? EGD ? ?Impression: ?- Esophageal polyp(s) were found. Biopsied. ?- Partially obstructing, likely malignant esophageal tumor was found in the distal esophagus. ?- Small hiatal hernia. ?- A single gastric polyp. Biopsied. ?- Normal duodenal bulb, first portion of the duodenum and second portion of the duodenum. ?  ?04/19/2021 Pathology Results  ? FINAL MICROSCOPIC DIAGNOSIS: ?Stomach, Biopsy ?- FUNDIC GLAND POLYP. Negative for dysplasia. NO Helicobacter pylori organisms seen on H and E stain. ?Esophagus- Distal, Biopsy ?- INVASIVE MODERATELY DIFFERENTIATED ADENOCARCINOMA OF THE ESOPHAGUS.  ?- HER2 Study by Immunostain: Negative (score 1+) ?Esophagus- Mid, Biopsy ?- INVASIVE MODERATELY DIFFERENTIATED ADENOCARCINOMA OF THE ESOPHAGUS. ?- HER2 Study by Immunostain: Negative (score 1+) ?  ?05/05/2021 Imaging  ? CT CAP ? ?IMPRESSION: ?1. The patient's known esophageal mass is not well visualized. There is mild irregular wall thickening of the distal esophagus. Correlate with previous clinical evaluation. ?2. Indeterminate mildly enlarged high right paratracheal node near ?the thoracic inlet. No other mediastinal adenopathy. ?3. No  evidence of metastatic disease in the abdomen or pelvis. ?4. Stable small pulmonary nodules bilaterally consistent with benign findings. ?5. Cholelithiasis, small renal cysts and Aortic Atherosclerosis ?(ICD10-I70.0). ?  ?05/09/2021 Initial Diagnosis  ? Malignant neoplasm of overlapping sites of esophagus [facility]) ? ?  ?05/11/2021 Procedure  ? Upper EUS ? ?Impression: ?- Mucosal nodule found in the esophagus. ?- Partially obstructing, malignant esophageal tumor was found at the gastroesophageal junction. ?- At least 3 discrete peritumoral lymph nodes ?- A mass was found in the gastroesophageal junction. A tissue diagnosis was obtained prior to this exam. This is of adenocarcinoma. This was staged T3 N2 Mx by endosonographic criteria. ?- Unable to traverse GE junction with either EGD scope or radial EUS scope. ?  ?05/13/2021 Cancer Staging  ? Staging form: Esophagus - Adenocarcinoma, AJCC 8th Edition ?- Clinical stage from 05/13/2021: Stage IVA (cT3, cN2, cM0) - Signed by [name], MD on 05/22/2021 ? ?  ?05/16/2021 PET scan  ? IMPRESSION: ?1. Distal esophageal mass measuring about 6.2 cm in length, maximum SUV 13.1. ?2. Mildly hypermetabolic and mildly enlarged upper right paratracheal lymph node, 1.3 cm in short axis with maximum SUV 3.6, concerning for malignant involvement. ?3. Hypodense 1.1 cm right thyroid nodule has a maximum SUV ?substantially above that of the background thyroid. A significant ?minority of such nodules can harbor thyroid cancer. Recommend ?thyroid US and biopsy (ref: J Am Coll Radiol. 2015 Feb;12(2): ?143-50). ?4. 3 by 4 mm right lower lobe pulmonary nodule is similar to ?05/05/2021, not hypermetabolic but below sensitive PET-CT size ?thresholds. Surveillance recommended. ?5. Other imaging findings of potential clinical significance: ?Chronic left maxillary sinusitis. Aortic Atherosclerosis ?(ICD10-I70.0). Cholelithiasis. Prostatomegaly. ?  ?05/24/2021 - 06/13/2021 Chemotherapy  ?  ? ?  ? ?  ?  05/26/2021  Pathology Results  ? FINAL MICROSCOPIC DIAGNOSIS:  ? ?A. LYMPH NODE, 2R, FINE NEEDLE ASPIRATION:  ?- Malignant cells consistent with non-small cell carcinoma  ? ?COMMENT:  ?The cells are positive for cytokeratin 20 and CDX-2 (patchy). TTF-1, NapsinA, cytokeratin 5/6, p40, S100, MelanA, and cytokeratin 7 are negative. The patient's history of esophageal adenocarcinoma is noted and CDX-2 positivity is consistent with a gastrointestinal primary. This cytokeratin7/20 profile is usually seen in lower tract tumors, but this pattern can be seen in gastric/esophageal cases. ?  ?05/27/2021 - 07/04/2021 Radiation Therapy  ? Per Dr. Isidore Moos ?  ?06/20/2021 - 08/08/2021 Chemotherapy  ? Patient is on Treatment Plan : GASTROESOPHAGEAL FOLFOX q14d x 6 cycles  ? ?   ?08/04/2021 PET scan  ? IMPRESSION: ?1. Interval development of a new 6 mm short axis right cervical node with hypermetabolism. Finding is somewhat indeterminate given the apparent response of the hypermetabolic right paratracheal lymphadenopathy seen on the previous study. Close follow-up recommended as metastatic disease not excluded. ?2. Diffuse hypermetabolic FDG accumulation noted in the esophagus today with more diffuse circumferential esophageal wall thickening on today's study than on the previous exam. Hypermetabolism in the distal esophagus at the level of the hypermetabolic neoplasm ?previously has decreased from SUV max = 13 to SUV max = 7 today. ?3. Stable tiny 3-4 mm right lower lobe and left upper lobe pulmonary nodules. Continued attention on follow-up recommended. ?4. Cholelithiasis. ?5. Emphysema.  (ICD10-J43.9) ?6.  Aortic Atherosclerois (ICD10-170.0) ?  ?08/17/2021 Pathology Results  ? FINAL MICROSCOPIC DIAGNOSIS:  ?A. LYMPH NODE, RIGHT CERVICAL, FINE NEEDLE ASPIRATION:  ?- No malignant cells identified  ?- See comment  ? ?COMMENT:  ?There is scant cellularity and the specimen may not be representative.  ?  ?09/05/2021 Definitive Surgery  ? FINAL  MICROSCOPIC DIAGNOSIS:  ? ?A. ESOPHAGOGASTRECTOMY:  ?- Invasive poorly differentiated adenocarcinoma.  ?- Metastatic carcinoma involving 5 of 13 lymph nodes (5/13).  ?- See oncology table below.  ? ?B. LYMPH NODE, LEVEL 8, EXCISION:  ?- One lymph node, negative for malignancy (0/1). ?  ?09/05/2021 Cancer Staging  ? Staging form: Esophagus - Adenocarcinoma, AJCC 8th Edition ?- Pathologic stage from 09/05/2021: Stage IIIB (ypT3, pN2, cM0, G3) - Signed by [name], MD on 12/03/2021 ?Stage prefix: Post-therapy ?Response to neoadjuvant therapy: Partial response ?Histologic grading system: 3 grade system ?Residual tumor (R): R0 - None ? ?  ?10/10/2021 -  Chemotherapy  ? Patient is on Treatment Plan : GASTROESOPHAGEAL Nivolumab q14d x 8 cycles / Nivolumab q28d  ? ?   ?12/30/2021 Imaging  ? EXAM: ?CT CHEST, ABDOMEN, AND PELVIS WITH CONTRAST ? ?IMPRESSION: ?Status post esophagectomy with gastric pull-through procedure. No ?evidence of recurrent or metastatic carcinoma within the chest, ?abdomen, or pelvis. ?  ?Cholelithiasis. No radiographic evidence of cholecystitis. ?  ?Stable enlarged prostate. ?  ?Aortic Atherosclerosis (ICD10-I70.0). ?  ? ? ?Current Therapy: Nivolumab day 1 cycle 8 on 02/27/2022 ? ?Interval history- Michio L. Bergum is a 66 year old male with oncologic history as above presenting to [facility] today with chief complaint of fever x1 day.  Tmax of 101.3 last night.  He did take a dose of Tylenol then.  He also was endorsing bilateral back hip and leg joint pain.  He rates the pain 5 out of 10 in severity.  He also mentioned some mild neck stiffness as well as generalized abdominal pain.  Pain did not radiate.  When he woke up this morning symptoms had completely resolved.  He  does note that his urine was dark and cloudy.  He denies any associated dysuria or urinary frequency.  Denies any sick contacts.  Admits to working outside in the yard although has not noted any tick bites.  He had his last treatment on Monday  and had the typical side effects afterward he states.  He denies any headache, visual changes, chest pain, shortness of breath, cough, diarrhea, rash. Care giver present and provides additional information. ?

## 2022-03-03 NOTE — ED Provider Notes (Signed)
?[name] ?Provider Note ? ? ?CSN: [id number] ?Arrival date & time: 03/03/22  1429 ? ?  ? ?History ? ?Chief Complaint  ?Patient presents with  ? Fever  ? ? ?Nathaniel Jennings is a 66 y.o. male. ? ?HPI ?66 yo male ho esophageal cancer received immunotherapy Monday.  Fever loc, and came to oncology clinic today.  Worked up with labs and found to have bili of 3.9, lfts elevated.  Patient has gb with known gallstone. ? ?  ? ?Home Medications ?Prior to Admission medications   ?Medication Sig Start Date End Date Taking? Authorizing Provider  ?aspirin EC 81 MG tablet Take 81 mg by mouth in the morning.    [provider]  ?ketoconazole (NIZORAL) 2 % cream Apply 1 application topically daily. 12/05/21   [name], MD  ?lidocaine-prilocaine (EMLA) cream Apply 1 application topically as needed. 10/10/21   [name], MD  ?metoprolol succinate (TOPROL-XL) 50 MG 24 hr tablet Take 50 mg by mouth daily. Take with or immediately following a meal.    [provider]  ?pantoprazole (PROTONIX) 40 MG tablet Take 1 tablet (40 mg total) by mouth 2 (two) times daily. 05/23/21   [name], MD  ?Skin Protectants, Misc. (EUCERIN) cream Apply topically as needed for dry skin.    [provider]  ?prochlorperazine (COMPAZINE) 10 MG tablet Take 1 tablet (10 mg total) by mouth every 6 (six) hours as needed (Nausea or vomiting). 05/23/21 06/13/21  [name], MD  ?   ? ?Allergies    ?Bee venom   ? ?Review of Systems   ?Review of Systems  ?HENT: Negative.    ?Musculoskeletal:  Positive for myalgias.  ?Skin:  Positive for rash.  ?All other systems reviewed and are negative. ? ?Physical Exam ?Updated Vital Signs ?BP 119/76 (BP Location: Left Arm)   Pulse 77   Temp 98.5 ?F (36.9 ?C) (Oral)   Ht 1.676 m ('5\' 6"'$ )   Wt 66.7 kg   SpO2 100%   BMI 23.74 kg/m?  ?Physical Exam ?Vitals and nursing note reviewed.  ?Constitutional:   ?   Appearance: Normal appearance.  ?HENT:  ?   Head: Normocephalic.  ?    Right Ear: External ear normal.  ?   Left Ear: External ear normal.  ?   Nose: Nose normal.  ?   Mouth/Throat:  ?   Pharynx: Oropharynx is clear.  ?Eyes:  ?   Extraocular Movements: Extraocular movements intact.  ?   Pupils: Pupils are equal, round, and reactive to light.  ?Cardiovascular:  ?   Rate and Rhythm: Normal rate and regular rhythm.  ?   Pulses: Normal pulses.  ?Pulmonary:  ?   Effort: Pulmonary effort is normal.  ?   Comments: Chest wall with port in place ?No surrounding tenderness, redness, or crepitus ?Abdominal:  ?   General: Abdomen is flat. Bowel sounds are normal.  ?   Palpations: Abdomen is soft.  ?Musculoskeletal:     ?   General: Normal range of motion.  ?   Cervical back: Normal range of motion.  ?Skin: ?   General: Skin is warm and dry.  ?   Capillary Refill: Capillary refill takes less than 2 seconds.  ?Neurological:  ?   General: No focal deficit present.  ?   Mental Status: He is alert.  ?Psychiatric:     ?   Mood and Affect: Mood normal.  ? ? ?ED Results / Procedures /  Treatments   ?Labs ?(all labs ordered are listed, but only abnormal results are displayed) ?Labs Reviewed  ?URINALYSIS, ROUTINE W REFLEX MICROSCOPIC - Abnormal; Notable for the following components:  ?    Result Value  ? Ketones, ur 20 (*)   ? All other components within normal limits  ?CULTURE, BLOOD (ROUTINE X 2)  ?CULTURE, BLOOD (ROUTINE X 2)  ?LACTIC ACID, PLASMA  ?LACTIC ACID, PLASMA  ? ? ?EKG ?None ? ?Radiology ?CT ABDOMEN PELVIS W CONTRAST ? ?Result Date: 03/03/2022 ?CLINICAL DATA:  Abdominal pain, fever, elevated bilirubin, history of esophageal cancer EXAM: CT ABDOMEN AND PELVIS WITH CONTRAST TECHNIQUE: Multidetector CT imaging of the abdomen and pelvis was performed using the standard protocol following bolus administration of intravenous contrast. RADIATION DOSE REDUCTION: This exam was performed according to the departmental dose-optimization program which includes automated exposure control, adjustment of the  mA and/or kV according to patient size and/or use of iterative reconstruction technique. CONTRAST:  145m OMNIPAQUE IOHEXOL 300 MG/ML  SOLN COMPARISON:  12/30/2021 FINDINGS: Lower chest: No acute pleural or parenchymal lung disease. Postsurgical changes from esophagectomy and gastric pull-through procedure. Hepatobiliary: Small calcified gallstones are layering dependently within the gallbladder. No gallbladder wall thickening or pericholecystic fluid to suggest cholecystitis. The liver is unremarkable. No biliary duct dilation or evidence of choledocholithiasis. Pancreas: Unremarkable. No pancreatic ductal dilatation or surrounding inflammatory changes. Spleen: Normal in size without focal abnormality. Adrenals/Urinary Tract: Kidneys are stable without urinary tract calculi or obstructive uropathy. The adrenals and bladder are unremarkable. Stomach/Bowel: No bowel obstruction or ileus. Normal appendix right lower quadrant. Scattered sigmoid diverticulosis without diverticulitis. No bowel wall thickening or inflammatory change. Vascular/Lymphatic: Aortic atherosclerosis. No enlarged abdominal or pelvic lymph nodes. Reproductive: Stable marked enlargement of the prostate measuring up to 5.7 x 5.9 cm in transverse dimension. Other: No free fluid or free intraperitoneal gas. No abdominal wall hernia. Musculoskeletal: No acute or destructive bony lesions. Reconstructed images demonstrate no additional findings. IMPRESSION: 1. Cholelithiasis without cholecystitis. 2. Minimal sigmoid diverticulosis without diverticulitis. 3. Stable enlarged prostate. 4. Postsurgical changes from esophagectomy and gastric pull-through procedure. 5.  Aortic Atherosclerosis (ICD10-I70.0). Electronically Signed   By: MRanda NgoM.D.   On: 03/03/2022 16:15  ? ?DG Chest Port 1 View ? ?Result Date: 03/03/2022 ?CLINICAL DATA:  fever oncology immunosuppressed EXAM: PORTABLE CHEST 1 VIEW COMPARISON:  Radiograph 10/28/2021. FINDINGS: Chest port  catheter tip overlies the superior cavoatrial junction. The cardiomediastinal silhouette is within normal limits. There is no focal airspace consolidation. There is no pleural effusion. No pneumothorax. There is no acute osseous abnormality. IMPRESSION: No evidence of acute cardiopulmonary disease. Electronically Signed   By: JMaurine SimmeringM.D.   On: 03/03/2022 16:30   ? ?Procedures ?Procedures  ? ? ?Medications Ordered in ED ?Medications  ?ceFEPIme (MAXIPIME) 2 g in sodium chloride 0.9 % 100 mL IVPB (2 g Intravenous New Bag/Given 03/03/22 1636)  ?0.9 %  sodium chloride infusion (has no administration in time range)  ?iohexol (OMNIPAQUE) 300 MG/ML solution 100 mL (100 mLs Intravenous Contrast Given 03/03/22 1602)  ?sodium chloride (PF) 0.9 % injection (  Given by Other 03/03/22 1639)  ? ? ?ED Course/ Medical Decision Making/ A&P ?Clinical Course as of 03/03/22 1700  ?Fri Mar 03, 2022  ?1627 CT obtained is significant for cholelithiasis without cholecystitis ?Stable sigmoid diverticulosis [DR]  ?1641 Chest x-Satchel Heidinger reviewed interpreted no evidence of acute disease noted ?Reviewed radiologist interpretation [DR]  ?  ?Clinical Course User Index ?[DR] RPattricia Boss MD  ? ?                        ?  Medical Decision Making ?66 year old male with known adenocarcinoma of esophagus and is status post radiation, chemo, surgical repair, known gallstones, recent immunotherapy presents today with fever last night. ?Patient seen and evaluated in oncology clinic.  They obtained labs.  These are significant for elevated bilirubin.  Patient brought to ED for further evaluation of fever and in the ED patient had blood cultures obtained and broad-spectrum antibiotics.  Abdomen is soft and nontender but given elevated bilirubin, patient had CT of abdomen obtained. ?OP labs reviewed from oncology today- elevated bili and transaminases ?CBC with some anemia but otherwise normal ?Given recent immunotherapy (nivolumab), patient will be observed  with IV antibiotics pending cultures. ? ?1-Immunosuppression with fever-patient being treated with broad-spectrum antibiotics, blood cultures obtained ?2 elevated bilirubin and LFTs-no evidence of acute intra-

## 2022-03-03 NOTE — H&P (Signed)
? ? ?HPI ? ?Nathaniel Jennings KZS:[id number] DOB: 22-Mar-1956 DOA: 03/03/2022 ? ?PCP: Cyndi Bender, PA-C  ? ?Chief Complaint: Sent over from physician office-fever and myalgias yesterday ? ?HPI:  ?6 white male stage IIIb esophageal adenocarcinoma status post esophageal gastrectomy 09/05/2021, XRT 8/5 through 07/04/2021-currently on nivolumab q.  4-wk x 8 cycles--restaging CT 12/30/2021 which did not show anything--patient is supposed to have immunotherapy until October 2023 ?Hypermetabolic thyroid nodule 04/24/2201 biopsy 1022 showed scant follicular epithelium ? ?Received immunotherapy last 02/27/2022--- started feeling poorly on 5/11-developed 101.3 fever with myalgias chills ?No chest pain no fever no nausea no vomiting no blurred vision no double vision  ?No unilateral weakness ?He took 2 extra strength Tylenol at 7 PM and then another 2 at 72 PM because he was feeling so poorly did not feel hungry and went to sleep ? ?His partner called Dr. Ernestina Penna office this morning and patient was told to present to the emergency room for work-up ? ?On work-up he was found to be afebrile pressures in the 130s other vital signs stable and satting 100% on room air ?Labs revealed bili 3.9 which is new for the patient--he has had no nausea vomiting whatsoever had breakfast this morning has no abdominal pain ? ?Review of Systems:  ?Patient tells me that typically during his immunotherapy cycles he has myalgias sometimes discomfort and feels "washed out" he has never had fevers though ?This time around he felt completely fatigued had headaches, hip pain, back pain and just felt extremely tired along with a fever ? ? ?ED Course: Given dose of cefepime blood culture asked to to be collected ? ? ?Past Medical History:  ?Diagnosis Date  ? Arthritis   ? BPH (benign prostatic hyperplasia)   ? Dyslipidemia   ? Elevated PSA   ? Esophageal cancer ([place])   ? Full dentures   ? GERD (gastroesophageal reflux disease)   ? History of melanoma excision   ?  07/ 2011  s/p  wide excision left back--- per pathology-- negative maligant --  mild melanocytic atypia  ? History of palpitations   ? cardiologist --  dr Shelva Majestic (last note in epic 2014  ? Hypertension   ? Sleep apnea   ? ?Past Surgical History:  ?Procedure Laterality Date  ? COLONOSCOPY  10/24/2007  ? ESOPHAGOGASTRODUODENOSCOPY N/A 09/05/2021  ? Procedure: ESOPHAGOGASTRODUODENOSCOPY (EGD);  Surgeon: Lajuana Matte, MD;  Location: [facility] OR;  Service: Thoracic;  Laterality: N/A;  ? ESOPHAGOGASTRODUODENOSCOPY (EGD) WITH PROPOFOL N/A 05/11/2021  ? Procedure: ESOPHAGOGASTRODUODENOSCOPY (EGD) WITH PROPOFOL;  Surgeon: Arta Silence, MD;  Location: [facility] ENDOSCOPY;  Service: Endoscopy;  Laterality: N/A;  ? EUS N/A 05/11/2021  ? Procedure: ESOPHAGEAL ENDOSCOPIC ULTRASOUND (EUS) RADIAL;  Surgeon: Arta Silence, MD;  Location: [facility];  Service: Endoscopy;  Laterality: N/A;  ? EYE SURGERY    ? Lasik surgery on both eyes, but still needs glasses  ? FINE NEEDLE ASPIRATION  05/26/2021  ? Procedure: FINE NEEDLE ASPIRATION (FNA) LINEAR;  Surgeon: Garner Nash, DO;  Location: [facility];  Service: Pulmonary;;  ? INTERCOSTAL NERVE BLOCK  09/05/2021  ? Procedure: INTERCOSTAL NERVE BLOCK;  Surgeon: Lajuana Matte, MD;  Location: [facility];  Service: Thoracic;;  ? IR IMAGING GUIDED PORT INSERTION  06/30/2021  ? JEJUNOSTOMY  09/05/2021  ? Procedure: JEJUNOSTOMY;  Surgeon: Lajuana Matte, MD;  Location: [place];  Service: Thoracic;;  ? PROSTATE BIOPSY N/A 07/02/2015  ? Procedure: BIOPSY TRANSRECTAL ULTRASONIC PROSTATE (TUBP);  Surgeon: Lowella Bandy, MD;  Location: [facility];  Service: Urology;  Laterality: N/A;  ? PROSTATE BIOPSY N/A 11/22/2015  ? Procedure: BIOPSY TRANSRECTAL ULTRASONIC PROSTATE (TUBP);  Surgeon: Cleon Gustin, MD;  Location: [facility]  Service: Urology;  Laterality: N/A;  ? TRANSTHORACIC ECHOCARDIOGRAM  11/19/2012  ? normal echo/  trivial TR  ? VIDEO  BRONCHOSCOPY WITH ENDOBRONCHIAL ULTRASOUND N/A 05/26/2021  ? Procedure: VIDEO BRONCHOSCOPY WITH ENDOBRONCHIAL ULTRASOUND;  Surgeon: Garner Nash, DO;  Location: [place];  Service: Pulmonary;  Laterality: N/A;  ? WIDE EXCISION MELANOMA LEFT BACK  05/02/2010  ? ? reports that he quit smoking about 10 years ago. His smoking use included cigarettes. He has a 84.00 pack-year smoking history. He has quit using smokeless tobacco.  His smokeless tobacco use included snuff. He reports that he does not currently use alcohol after a past usage of about 2.0 standard drinks per week. He reports that he does not use drugs. ? 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 ?Smoker from the age of 89 until 2012 and then quit 1 pack/day ?Drinks 3-4 beers every several days no drugs ? ?Allergies  ?Allergen Reactions  ? Bee Venom Anaphylaxis  ? ?Family History  ?Problem Relation Age of Onset  ? Kidney failure Mother   ?     died age 71  ? Pneumonia Father   ?     died age 51  ? ?Prior to Admission medications   ?Medication Sig Start Date End Date Taking? Authorizing Provider  ?aspirin EC 81 MG tablet Take 81 mg by mouth in the morning.    [provider]  ?ketoconazole (NIZORAL) 2 % cream Apply 1 application topically daily. 12/05/21   [name], MD  ?lidocaine-prilocaine (EMLA) cream Apply 1 application topically as needed. 10/10/21   [name], MD  ?metoprolol succinate (TOPROL-XL) 50 MG 24 hr tablet Take 50 mg by mouth daily. Take with or immediately following a meal.    [provider]  ?pantoprazole (PROTONIX) 40 MG tablet Take 1 tablet (40 mg total) by mouth 2 (two) times daily. 05/23/21   [name], MD  ?Skin Protectants, Misc. (EUCERIN) cream Apply topically as needed for dry skin.    [provider]  ?prochlorperazine (COMPAZINE) 10 MG tablet Take 1 tablet (10 mg total) by mouth every 6 (six) hours as needed (Nausea or vomiting). 05/23/21 06/13/21  [name], MD  ? ? ?Physical Exam: ? ?Vitals:  ? 03/03/22 1446  ?BP: 119/76  ?Pulse: 77  ?Temp: 98.5 ?F (36.9 ?C)  ?SpO2: 100%  ? ?Awake coherent pleasant no icterus no pallor  ?CTA B no rales rhonchi wheeze or adventitious sound ?ABD soft no rebound no guarding, Murphy sign is absent-bowel sounds are heard ?Not distended ?ROM intact power 5/5 good strong grip neurologically is intact ?S1-S2 no murmur no rub no gallop-he has a port in the left side of his chest ?He has some mild eczema on lower extremities ? ? ? ?I have personally reviewed following labs and imaging studies ? ?Labs:  ?White count 9.5, hemoglobin 12.7 baseline is around 12-13 ?Platelets 142 baseline in the 150s and 60s ? ? ?Imaging studies:  ?CXR 1 view showed no evidence of any type of obstruction or pneumonia ? ?Medical tests:  ?EKG independently reviewed: PR interval 0.12 QRS axis is 50 degrees ?No ST-T wave changes across precordium ? ?Test discussed with performing physician: ?Yes discussed case with Dr. Jeanell Sparrow ? ?Decision  to obtain old records:  ?Yes ? ?Review and summation of old records:  ?Yes ? ?Active Problems: ?  * No active hospital problems. * ? ? ?Assessment/Plan ? ?Hyperbilirubinemia + fever in the setting of immunotherapy ?DDx possibly cholelithiasis but CT scan only shows nonobstructive cholelithiasis given the fact that the patient has no other symptoms and is pretty stable without any abdominal pain, I think this can be attributable to possible Nivolumab--this apparently has 13 to 15%,incidence of LFT elevation, 20% incidence of arthralgias and about 20% incidence of headaches ?For completion sake follow blood culture obtain urine culture and continue broad-spectrum antibiotics ?We will give saline 125 cc/H-monitor ?If the bilirubin goes up any further would work-up with LDH, peripheral smear haptoglobin and direct as well as indirect fractionation of bilirubin ?Esophageal cancer status post esophagectomy on immunotherapy ?Did message Dr.  Burr Medico and added her to the treatment team ?I have asked her to weigh in ?Mild anemia of malignancy ?Hemoglobin is actually close to a normal range-do not think the patient is hemolyzing ?Elevated PSA has a 6 cm

## 2022-03-04 DIAGNOSIS — E869 Volume depletion, unspecified: Secondary | ICD-10-CM | POA: Diagnosis not present

## 2022-03-04 LAB — COMPREHENSIVE METABOLIC PANEL
ALT: 74 U/L — ABNORMAL HIGH (ref 0–44)
AST: 52 U/L — ABNORMAL HIGH (ref 15–41)
Albumin: 2.9 g/dL — ABNORMAL LOW (ref 3.5–5.0)
Alkaline Phosphatase: 93 U/L (ref 38–126)
Anion gap: 5 (ref 5–15)
BUN: 10 mg/dL (ref 8–23)
CO2: 22 mmol/L (ref 22–32)
Calcium: 7.4 mg/dL — ABNORMAL LOW (ref 8.9–10.3)
Chloride: 111 mmol/L (ref 98–111)
Creatinine, Ser: 0.71 mg/dL (ref 0.61–1.24)
GFR, Estimated: >60 mL/min (ref >60)
Glucose, Bld: 84 mg/dL (ref 70–99)
Potassium: 2.9 mmol/L — ABNORMAL LOW (ref 3.5–5.1)
Sodium: 138 mmol/L (ref 135–145)
Total Bilirubin: 1.9 mg/dL — ABNORMAL HIGH (ref 0.3–1.2)
Total Protein: 5.3 g/dL — ABNORMAL LOW (ref 6.5–8.1)

## 2022-03-04 LAB — MAGNESIUM: Magnesium: 1.5 mg/dL — ABNORMAL LOW (ref 1.7–2.4)

## 2022-03-04 LAB — CBC
HCT: 31.3 % — ABNORMAL LOW (ref 39.0–52.0)
Hemoglobin: 10.6 g/dL — ABNORMAL LOW (ref 13.0–17.0)
MCH: 32.1 pg (ref 26.0–34.0)
MCHC: 33.9 g/dL (ref 30.0–36.0)
MCV: 94.8 fL (ref 80.0–100.0)
Platelets: 103 10*3/uL — ABNORMAL LOW (ref 150–400)
RBC: 3.3 MIL/uL — ABNORMAL LOW (ref 4.22–5.81)
RDW: 14.5 % (ref 11.5–15.5)
WBC: 4.3 10*3/uL (ref 4.0–10.5)
nRBC: 0 % (ref 0.0–0.2)

## 2022-03-04 LAB — PROTIME-INR
INR: 1.3 — ABNORMAL HIGH (ref 0.8–1.2)
Prothrombin Time: 15.9 seconds — ABNORMAL HIGH (ref 11.4–15.2)

## 2022-03-04 LAB — URINE CULTURE: Culture: NO GROWTH

## 2022-03-04 LAB — T4: T4, Total: 7.4 ug/dL (ref 4.5–12.0)

## 2022-03-04 MED ORDER — MAGNESIUM SULFATE 4 GM/100ML IV SOLN
4.0000 g | Freq: Once | INTRAVENOUS | Status: AC
  Administered 2022-03-04: 4 g via INTRAVENOUS
  Filled 2022-03-04: qty 100

## 2022-03-04 MED ORDER — POTASSIUM CHLORIDE CRYS ER 20 MEQ PO TBCR
40.0000 meq | EXTENDED_RELEASE_TABLET | Freq: Two times a day (BID) | ORAL | Status: DC
  Administered 2022-03-04 – 2022-03-05 (×3): 40 meq via ORAL
  Filled 2022-03-04 (×3): qty 2

## 2022-03-04 MED ORDER — POTASSIUM CHLORIDE 2 MEQ/ML IV SOLN
INTRAVENOUS | Status: DC
  Filled 2022-03-04 (×3): qty 1000

## 2022-03-04 NOTE — Progress Notes (Signed)
?PROGRESS NOTE ? ? ?Nathaniel Jennings  ONG:[id number] DOB: June 03, 1956 DOA: 03/03/2022 ?PCP: Cyndi Bender, PA-C  ?Brief Narrative:  ?72 white male stage IIIb esophageal adenocarcinoma status post esophageal gastrectomy 09/05/2021, XRT 8/5 through 07/04/2021-currently on nivolumab q.  4-wk x 8 cycles--restaging CT 12/30/2021 which did not show anything--patient is supposed to have immunotherapy until October 2023 ?Hypermetabolic thyroid nodule 01/24/101 biopsy 1022 showed scant follicular epithelium ?  ?Received immunotherapy last 02/27/2022--- started feeling poorly on 5/11-developed 101.3 fever with myalgias chills ?No chest pain no fever no nausea no vomiting no blurred vision no double vision  ?No unilateral weakness ?He took 2 extra strength Tylenol at 7 PM and then another 2 at 4 PM because he was feeling so poorly did not feel hungry and went to sleep ?  ?His partner called Dr. Ernestina Penna office this morning and patient was told to present to the emergency room for work-up ?  ? ?Hospital-Problem based course ? ?Hyperbilirubinemia, transaminitis probably secondary to immunotherapy ?Blood urine culture pending ?Continue broad-spectrum antibiotics but DC saline ?If no source discontinue antibiotic ?If labs look normal-would consider sending him home tomorrow with close follow-up with oncologist ?Severe hypokalemia ?Initially replaced with saline containing K however changed to K. Dur twice daily ?Give  IV magnesium 4 mg ?Prior prostate cancer apparently biopsy 2016 Dr. Kellie Simmering ?Outpatient follow-up ?HTN controlled ?Continue Toprol-XL 50 ? ?DVT prophylaxis: Heparin ?Code Status: Full ?Family Communication: None ?Disposition:  ?Status is: Inpatient ?Remains inpatient appropriate because:  ? ?Not ready for discharge ?  ?Consultants:  ?Telephone consulted oncologist Dr. Luis Abed ? ?Procedures:  ? ?Antimicrobials:   ? ? ?Subjective: ?Doing great feels normal no fevers no chills no other complaints ? ?Objective: ?Vitals:  ? 03/03/22  2300 03/03/22 2332 03/04/22 0402 03/04/22 1536  ?BP: 113/74 118/78 118/80 135/89  ?Pulse: 89 90 90 78  ?Resp:  _0 ?Temp: 98 ?F (36.7 ?C) 99.4 ?F (37.4 ?C) 99.3 ?F (37.4 ?C) 98.9 ?F (37.2 ?C)  ?TempSrc:  Oral Oral Oral  ?SpO2: 96% 96% 95% 99%  ?Weight:  61.4 kg    ?Height:      ? ? ?Intake/Output Summary (Last 24 hours) at 03/04/2022 1540 ?Last data filed at 03/04/2022 1404 ?Gross per 24 hour  ?Intake 705.28 ml  ?Output --  ?Net 705.28 ml  ? ?Filed Weights  ? 03/03/22 1446 03/03/22 2332  ?Weight: 66.7 kg 61.4 kg  ? ? ?Examination: ? ? ? ?Data Reviewed: personally reviewed  ? ?CBC ?   ?Component Value Date/Time  ? WBC 4.3 03/04/2022 0527  ? RBC 3.30 (L) 03/04/2022 0527  ? HGB 10.6 (L) 03/04/2022 0527  ? HGB 12.7 (L) 03/03/2022 1141  ? HCT 31.3 (L) 03/04/2022 0527  ? PLT 103 (L) 03/04/2022 0527  ? PLT 142 (L) 03/03/2022 1141  ? MCV 94.8 03/04/2022 0527  ? MCH 32.1 03/04/2022 0527  ? MCHC 33.9 03/04/2022 0527  ? RDW 14.5 03/04/2022 0527  ? LYMPHSABS 0.4 (L) 03/03/2022 1141  ? MONOABS 0.8 03/03/2022 1141  ? EOSABS 0.1 03/03/2022 1141  ? BASOSABS 0.0 03/03/2022 1141  ? ? ?  Latest Ref Rng & Units 03/04/2022  ?  5:27 AM 03/03/2022  ? 11:41 AM 02/27/2022  ? 11:41 AM  ?CMP  ?Glucose 70 - 99 mg/dL 84   101   73    ?BUN 8 - 23 mg/dL _1 ?Creatinine 0.61 - 1.24 mg/dL 0.71   0.97  0.87    ?Sodium 135 - 145 mmol/L 138   137   140    ?Potassium 3.5 - 5.1 mmol/L 2.9   3.6   3.6    ?Chloride 98 - 111 mmol/L 111   103   108    ?CO2 22 - 32 mmol/L _0 ?Calcium 8.9 - 10.3 mg/dL 7.4   8.9   9.0    ?Total Protein 6.5 - 8.1 g/dL 5.3   6.7   7.1    ?Total Bilirubin 0.3 - 1.2 mg/dL 1.9   3.9   0.6    ?Alkaline Phos 38 - 126 U/L 93   133   82    ?AST 15 - 41 U/L 52   136   16    ?ALT 0 - 44 U/L 74   135   14    ? ? ? ?Radiology Studies: ?CT ABDOMEN PELVIS W CONTRAST ? ?Result Date: 03/03/2022 ?CLINICAL DATA:  Abdominal pain, fever, elevated bilirubin, history of esophageal cancer EXAM: CT ABDOMEN AND PELVIS WITH  CONTRAST TECHNIQUE: Multidetector CT imaging of the abdomen and pelvis was performed using the standard protocol following bolus administration of intravenous contrast. RADIATION DOSE REDUCTION: This exam was performed according to the departmental dose-optimization program which includes automated exposure control, adjustment of the mA and/or kV according to patient size and/or use of iterative reconstruction technique. CONTRAST:  159m OMNIPAQUE IOHEXOL 300 MG/ML  SOLN COMPARISON:  12/30/2021 FINDINGS: Lower chest: No acute pleural or parenchymal lung disease. Postsurgical changes from esophagectomy and gastric pull-through procedure. Hepatobiliary: Small calcified gallstones are layering dependently within the gallbladder. No gallbladder wall thickening or pericholecystic fluid to suggest cholecystitis. The liver is unremarkable. No biliary duct dilation or evidence of choledocholithiasis. Pancreas: Unremarkable. No pancreatic ductal dilatation or surrounding inflammatory changes. Spleen: Normal in size without focal abnormality. Adrenals/Urinary Tract: Kidneys are stable without urinary tract calculi or obstructive uropathy. The adrenals and bladder are unremarkable. Stomach/Bowel: No bowel obstruction or ileus. Normal appendix right lower quadrant. Scattered sigmoid diverticulosis without diverticulitis. No bowel wall thickening or inflammatory change. Vascular/Lymphatic: Aortic atherosclerosis. No enlarged abdominal or pelvic lymph nodes. Reproductive: Stable marked enlargement of the prostate measuring up to 5.7 x 5.9 cm in transverse dimension. Other: No free fluid or free intraperitoneal gas. No abdominal wall hernia. Musculoskeletal: No acute or destructive bony lesions. Reconstructed images demonstrate no additional findings. IMPRESSION: 1. Cholelithiasis without cholecystitis. 2. Minimal sigmoid diverticulosis without diverticulitis. 3. Stable enlarged prostate. 4. Postsurgical changes from esophagectomy  and gastric pull-through procedure. 5.  Aortic Atherosclerosis (ICD10-I70.0). Electronically Signed   By: MRanda NgoM.D.   On: 03/03/2022 16:15  ? ?DG Chest Port 1 View ? ?Result Date: 03/03/2022 ?CLINICAL DATA:  fever oncology immunosuppressed EXAM: PORTABLE CHEST 1 VIEW COMPARISON:  Radiograph 10/28/2021. FINDINGS: Chest port catheter tip overlies the superior cavoatrial junction. The cardiomediastinal silhouette is within normal limits. There is no focal airspace consolidation. There is no pleural effusion. No pneumothorax. There is no acute osseous abnormality. IMPRESSION: No evidence of acute cardiopulmonary disease. Electronically Signed   By: JMaurine SimmeringM.D.   On: 03/03/2022 16:30   ? ? ?Scheduled Meds: ? aspirin EC  81 mg Oral q morning  ? heparin  5,000 Units Subcutaneous Q8H  ? metoprolol succinate  50 mg Oral Daily  ? pantoprazole  40 mg Oral BID  ? potassium chloride  40 mEq Oral BID  ? ?Continuous Infusions: ? ?  LOS: 1 day  ? ?Time spent: 60 ? ?Nita Sells, MD ?[facility] ?To contact the attending provider between 7A-7P or the covering provider during after hours 7P-7A, please log into the web site [email or web address] and access using universal [place] password for that web site. If you do not have the password, please call the hospital operator. ? ?03/04/2022, 3:40 PM  ? ? ?

## 2022-03-04 NOTE — Progress Notes (Signed)
Nathaniel Jennings   DOB:1956-03-13   UM#:[id number]   VQM#:[id number] ? ?Oncology follow up  ? ?Subjective: pt is well-known to me, he was admitted to hospital from my office due to recent fever and hyperbilirubinemia.  He is feeding well, denies any pain, or other new symptoms.  ? ? ?Objective:  ?Vitals:  ? 03/04/22 0402 03/04/22 1536  ?BP: 118/80 135/89  ?Pulse: 90 78  ?Resp: 16 16  ?Temp: 99.3 ?F (37.4 ?C) 98.9 ?F (37.2 ?C)  ?SpO2: 95% 99%  ?  Body mass index is 21.86 kg/m?. ? ?Intake/Output Summary (Last 24 hours) at 03/04/2022 1644 ?Last data filed at 03/04/2022 1404 ?Gross per 24 hour  ?Intake 705.28 ml  ?Output --  ?Net 705.28 ml  ? ? ? Sclerae unicteric ? Oropharynx clear ? No peripheral adenopathy ? Lungs clear -- no rales or rhonchi ? Heart regular rate and rhythm ? Abdomen benign ? MSK no focal spinal tenderness, no peripheral edema ? Neuro nonfocal ?  ? ?CBG (last 3)  ?No results for input(s): GLUCAP in the last 72 hours. ? ? ?Labs:  ? ?Urine Studies ?No results for input(s): UHGB, CRYS in the last 72 hours. ? ?Invalid input(s): UACOL, UAPR, USPG, UPH, UTP, UGL, UKET, UBIL, UNIT, UROB, ULEU, UEPI, UWBC, URBC, UBAC, CAST, UCOM, BILUA ? ?Basic Metabolic Panel: ?Recent Labs  ?Lab 02/27/22 ?1141 03/03/22 ?1141 03/04/22 ?0527  ?NA 140 137 138  ?K 3.6 3.6 2.9*  ?CL 108 103 111  ?CO2 '28 29 22  '$ ?GLUCOSE 73 101* 84  ?BUN '17 17 10  '$ ?CREATININE 0.87 0.97 0.71  ?CALCIUM 9.0 8.9 7.4*  ?MG  --   --  1.5*  ? ?GFR ?Estimated Creatinine Clearance: 79.9 mL/min (by C-G formula based on SCr of 0.71 mg/dL). ?Liver Function Tests: ?Recent Labs  ?Lab 02/27/22 ?1141 03/03/22 ?1141 03/04/22 ?0527  ?AST 16 136* 52*  ?ALT 14 135* 74*  ?ALKPHOS 82 133* 93  ?BILITOT 0.6 3.9* 1.9*  ?PROT 7.1 6.7 5.3*  ?ALBUMIN 4.1 4.0 2.9*  ? ?No results for input(s): LIPASE, AMYLASE in the last 168 hours. ?No results for input(s): AMMONIA in the last 168 hours. ?Coagulation profile ?Recent Labs  ?Lab 03/04/22 ?0527  ?INR 1.3*  ? ? ?CBC: ?Recent Labs  ?Lab  02/27/22 ?1141 03/03/22 ?1141 03/04/22 ?0527  ?WBC 3.4* 9.5 4.3  ?NEUTROABS 2.1 8.2*  --   ?HGB 13.0 12.7* 10.6*  ?HCT 37.9* 36.3* 31.3*  ?MCV 91.8 91.2 94.8  ?PLT 122* 142* 103*  ? ?Cardiac Enzymes: ?No results for input(s): CKTOTAL, CKMB, CKMBINDEX, TROPONINI in the last 168 hours. ?BNP: ?Invalid input(s): POCBNP ?CBG: ?No results for input(s): GLUCAP in the last 168 hours. ?D-Dimer ?No results for input(s): DDIMER in the last 72 hours. ?Hgb A1c ?No results for input(s): HGBA1C in the last 72 hours. ?Lipid Profile ?No results for input(s): CHOL, HDL, LDLCALC, TRIG, CHOLHDL, LDLDIRECT in the last 72 hours. ?Thyroid function studies ?Recent Labs  ?  03/03/22 ?1141  ?TSH 1.752  ?T4TOTAL 7.4  ? ?Anemia work up ?No results for input(s): VITAMINB12, FOLATE, FERRITIN, TIBC, IRON, RETICCTPCT in the last 72 hours. ?Microbiology ?Recent Results (from the past 240 hour(s))  ?Culture, Blood     Status: None (Preliminary result)  ? Collection Time: 03/03/22 11:37 AM  ? Specimen: BLOOD  ?Result Value Ref Range Status  ? Specimen Description BLOOD RIGHT ANTECUBITAL  Final  ? Special Requests   Final  ?  BOTTLES DRAWN AEROBIC AND ANAEROBIC Blood Culture adequate  volume  ? Culture   Final  ?  NO GROWTH < 24 HOURS ?Performed at [facility], [place] [address], [place], [place] [address] ?  ? Report Status PENDING  Incomplete  ?Urine Culture     Status: None  ? Collection Time: 03/03/22 11:40 AM  ? Specimen: Urine, Clean Catch  ?Result Value Ref Range Status  ? Specimen Description   Final  ?  URINE, CLEAN CATCH ?Performed at [facility], [place] [address], [place], [place] [address] ?  ? Special Requests   Final  ?  NONE ?Performed at [facility], [place] [address], [place], [place] [address] ?  ? Culture   Final  ?  NO GROWTH ?Performed at [facility], [place] [address], [place], [place] [address] ?  ? Report Status 03/04/2022 FINAL  Final  ?Culture, blood (single) w Reflex to  ID Panel     Status: None (Preliminary result)  ? Collection Time: 03/03/22 11:50 AM  ? Specimen: BLOOD  ?Result Value Ref Range Status  ? Specimen Description BLOOD PORTA CATH  Final  ? Special Requests   Final  ?  BOTTLES DRAWN AEROBIC AND ANAEROBIC Blood Culture results may not be optimal due to an excessive volume of blood received in culture bottles  ? Culture   Final  ?  NO GROWTH < 24 HOURS ?Performed at [facility], [place] [address], [place], [place] [address] ?  ? Report Status PENDING  Incomplete  ?Blood culture (routine x 2)     Status: None (Preliminary result)  ? Collection Time: 03/03/22  2:46 PM  ? Specimen: BLOOD  ?Result Value Ref Range Status  ? Specimen Description   Final  ?  BLOOD SITE NOT SPECIFIED ?Performed at [facility], [place] [address], [place], [place] [address] ?  ? Special Requests   Final  ?  BOTTLES DRAWN AEROBIC AND ANAEROBIC Blood Culture adequate volume ?Performed at [facility], [place] [address], [place], [place] [address] ?  ? Culture   Final  ?  NO GROWTH < 24 HOURS ?Performed at [facility], [place] [address] [place], [place] [address] ?  ? Report Status PENDING  Incomplete  ?Blood culture (routine x 2)     Status: None (Preliminary result)  ? Collection Time: 03/03/22  2:51 PM  ? Specimen: BLOOD  ?Result Value Ref Range Status  ? Specimen Description   Final  ?  BLOOD SITE NOT SPECIFIED ?Performed at [facility], [place] [address] [place], [place] [address] ?  ? Special Requests   Final  ?  BOTTLES DRAWN AEROBIC AND ANAEROBIC Blood Culture adequate volume ?Performed at [facility], [place] [address], [place], [place] [address] ?  ? Culture   Final  ?  NO GROWTH < 24 HOURS ?Performed at [facility], [place] [address] [place],  [address] ?  ? Report Status PENDING  Incomplete  ? ? ? ? ?Studies:  ?CT ABDOMEN PELVIS W CONTRAST ? ?Result Date: 03/03/2022 ?CLINICAL DATA:  Abdominal pain,  fever, elevated bilirubin, history of esophageal cancer EXAM: CT ABDOMEN AND PELVIS WITH CONTRAST TECHNIQUE: Multidetector CT imaging of the abdomen and pelvis was performed using the standard protocol following bolus administration of intravenous contrast. RADIATION DOSE REDUCTION: This exam was performed according to the departmental dose-optimization program which includes automated exposure control, adjustment of the mA and/or kV  according to patient size and/or use of iterative reconstruction technique. CONTRAST:  139m OMNIPAQUE IOHEXOL 300 MG/ML  SOLN COMPARISON:  12/30/2021 FINDINGS: Lower chest: No acute pleural or parenchymal lung disease. Postsurgical changes from esophagectomy and gastric pull-through procedure. Hepatobiliary: Small calcified gallstones are layering dependently within the gallbladder. No gallbladder wall thickening or pericholecystic fluid to suggest cholecystitis. The liver is unremarkable. No biliary duct dilation or evidence of choledocholithiasis. Pancreas: Unremarkable. No pancreatic ductal dilatation or surrounding inflammatory changes. Spleen: Normal in size without focal abnormality. Adrenals/Urinary Tract: Kidneys are stable without urinary tract calculi or obstructive uropathy. The adrenals and bladder are unremarkable. Stomach/Bowel: No bowel obstruction or ileus. Normal appendix right lower quadrant. Scattered sigmoid diverticulosis without diverticulitis. No bowel wall thickening or inflammatory change. Vascular/Lymphatic: Aortic atherosclerosis. No enlarged abdominal or pelvic lymph nodes. Reproductive: Stable marked enlargement of the prostate measuring up to 5.7 x 5.9 cm in transverse dimension. Other: No free fluid or free intraperitoneal gas. No abdominal wall hernia. Musculoskeletal: No acute or destructive bony lesions. Reconstructed images demonstrate no additional findings. IMPRESSION: 1. Cholelithiasis without cholecystitis. 2. Minimal sigmoid diverticulosis  without diverticulitis. 3. Stable enlarged prostate. 4. Postsurgical changes from esophagectomy and gastric pull-through procedure. 5.  Aortic Atherosclerosis (ICD10-I70.0). Electronically Signed   By: MLegrand Com[place]

## 2022-03-05 ENCOUNTER — Encounter: Payer: Self-pay | Admitting: Hematology

## 2022-03-05 DIAGNOSIS — E869 Volume depletion, unspecified: Secondary | ICD-10-CM | POA: Diagnosis not present

## 2022-03-05 LAB — CBC
HCT: 35.6 % — ABNORMAL LOW (ref 39.0–52.0)
Hemoglobin: 12.4 g/dL — ABNORMAL LOW (ref 13.0–17.0)
MCH: 32.7 pg (ref 26.0–34.0)
MCHC: 34.8 g/dL (ref 30.0–36.0)
MCV: 93.9 fL (ref 80.0–100.0)
Platelets: 123 10*3/uL — ABNORMAL LOW (ref 150–400)
RBC: 3.79 MIL/uL — ABNORMAL LOW (ref 4.22–5.81)
RDW: 14.5 % (ref 11.5–15.5)
WBC: 3.4 10*3/uL — ABNORMAL LOW (ref 4.0–10.5)
nRBC: 0 % (ref 0.0–0.2)

## 2022-03-05 LAB — URINE CULTURE: Culture: NO GROWTH

## 2022-03-05 LAB — COMPREHENSIVE METABOLIC PANEL
ALT: 68 U/L — ABNORMAL HIGH (ref 0–44)
AST: 33 U/L (ref 15–41)
Albumin: 3.5 g/dL (ref 3.5–5.0)
Alkaline Phosphatase: 140 U/L — ABNORMAL HIGH (ref 38–126)
Anion gap: 6 (ref 5–15)
BUN: 10 mg/dL (ref 8–23)
CO2: 26 mmol/L (ref 22–32)
Calcium: 9 mg/dL (ref 8.9–10.3)
Chloride: 109 mmol/L (ref 98–111)
Creatinine, Ser: 0.72 mg/dL (ref 0.61–1.24)
GFR, Estimated: >60 mL/min (ref >60)
Glucose, Bld: 99 mg/dL (ref 70–99)
Potassium: 4 mmol/L (ref 3.5–5.1)
Sodium: 141 mmol/L (ref 135–145)
Total Bilirubin: 1.4 mg/dL — ABNORMAL HIGH (ref 0.3–1.2)
Total Protein: 7 g/dL (ref 6.5–8.1)

## 2022-03-05 MED ORDER — HEPARIN SOD (PORK) LOCK FLUSH 100 UNIT/ML IV SOLN
500.0000 [IU] | Freq: Once | INTRAVENOUS | Status: AC
  Administered 2022-03-05: 500 [IU] via INTRAVENOUS
  Filled 2022-03-05: qty 5

## 2022-03-05 MED ORDER — POTASSIUM CHLORIDE 20 MEQ PO PACK: 40.0000 meq | PACK | Freq: Every day | ORAL | 0 refills | Status: DC

## 2022-03-05 MED ORDER — AMOXICILLIN-POT CLAVULANATE 875-125 MG PO TABS: 1.0000 | ORAL_TABLET | Freq: Two times a day (BID) | ORAL | 0 refills | Status: AC

## 2022-03-05 NOTE — Discharge Summary (Signed)
Physician Discharge Summary  ?Nathaniel Jennings JYN:[id number] DOB: 12/14/1955 DOA: 03/03/2022 ? ?PCP: Cyndi Bender, PA-C ? ?Admit date: 03/03/2022 ?Discharge date: 03/05/2022 ? ?Time spent: 33 minutes ? ?Recommendations for Outpatient Follow-up:  ?Outpatient follow-up Dr. Burr Medico as per her request--will be CCed ?Complete antibiotics, potassium ?Outpatient follow-up Dr. Simone Curia pace for prostate issues ? ?Discharge Diagnoses:  ?MAIN problem for hospitalization  ? ?SIRS?  Transaminitis?  Cause --- cause undefined at time of discharge--cannot give any further clarification ? ?Please see below for itemized issues addressed in [facility]- ?refer to other progress notes for clarity if needed ? ?Discharge Condition: Improved ? ?Diet recommendation: Regular ? ?Filed Weights  ? 03/03/22 1446 03/03/22 2332 03/05/22 0442  ?Weight: 66.7 kg 61.4 kg 61.4 kg  ? ? ?History of present illness:  ?46 white male stage IIIb esophageal adenocarcinoma status post esophageal gastrectomy 09/05/2021, XRT 8/5 through 07/04/2021-currently on nivolumab q.  4-wk x 8 cycles--restaging CT 12/30/2021 which did not show anything--patient is supposed to have immunotherapy until October 2023 ?Hypermetabolic thyroid nodule 05/29/5783 biopsy 1022 showed scant follicular epithelium ?  ?Received immunotherapy last 02/27/2022--- started feeling poorly on 5/11-developed 101.3 fever with myalgias chills ?No chest pain no fever no nausea no vomiting no blurred vision no double vision  ?No unilateral weakness ?He took 2 extra strength Tylenol at 7 PM and then another 2 at 63 PM because he was feeling so poorly did not feel hungry and went to sleep ?  ?His partner called Dr. Ernestina Penna office this morning and patient was told to present to the emergency room for work-up ?  ?  ? ?Hospital Course:  ?Hyperbilirubinemia, transaminitis probably secondary to immunotherapy ?All cultures negative ?No source therefore discharged home on 2 more days of Augmentin ?Outpatient follow-up for  further discussion of nivolumab in the outpatient with Dr. Burr Medico ?Severe hypokalemia ?Replaced aggressively with IV and p.o. in addition to magnesium replacement ?Going home on K-Lor low-dose with labs in the outpatient setting  ?Prior prostate enlargement with positive PSA r apparently biopsy 2016 Dr. Kellie Simmering ?Outpatient follow-up Dr. Simone Curia pace  ?HTN controlled ?Continue Toprol-XL 50 ? ? ?Discharge Exam: ?Vitals:  ? 03/04/22 2149 03/05/22 0442  ?BP: 130/86 122/84  ?Pulse: 94 78  ?Resp: 16 14  ?Temp: 98.4 ?F (36.9 ?C) 98.4 ?F (36.9 ?C)  ?SpO2: 100% 97%  ? ? ?Subj on day of d/c ?  ?Awake coherent no distress ? ?General Exam on discharge ? ?EOMI NCAT no rales rhonchi no wheeze ?Neck soft supple ?Abdomen soft ?No lower extremity edema ?Neuro intact ? ?Discharge Instructions ? ? ?Discharge Instructions   ? ? Diet - low sodium heart healthy   Complete by: As directed ?  ? Discharge instructions   Complete by: As directed ?  ? Pls follow up with Dr. Corinna Capra get some labs at either ur primary MD or at Oncology office in 1 week ? We will discharge you with probably Augmentin for 2-3 days--it is unknown what caused your fever, however, it probably is prudent to at least complete a standard course of antibioitcs --finish all the tablets please ? ?Take care and have a nice summer  ? Increase activity slowly   Complete by: As directed ?  ? ?  ? ?Allergies as of 03/05/2022   ? ?   Reactions  ? Bee Venom Anaphylaxis  ? ?  ? ?  ?Medication List  ?  ? ?TAKE these medications   ? ?acetaminophen 500 MG tablet ?Commonly known as: TYLENOL ?Take  1,000 mg by mouth every 6 (six) hours as needed for fever. ?  ?amoxicillin-clavulanate 875-125 MG tablet ?Commonly known as: Augmentin ?Take 1 tablet by mouth 2 (two) times daily for 2 days. ?  ?aspirin EC 81 MG tablet ?Take 81 mg by mouth in the morning. ?  ?eucerin cream ?Apply 1 application. topically daily as needed for dry skin. ?  ?ketoconazole 2 % cream ?Commonly known as:  NIZORAL ?Apply 1 application topically daily. ?What changed:  ?when to take this ?reasons to take this ?  ?lidocaine-prilocaine cream ?Commonly known as: EMLA ?Apply 1 application topically as needed. ?  ?metoprolol succinate 50 MG 24 hr tablet ?Commonly known as: TOPROL-XL ?Take 50 mg by mouth daily. Take with or immediately following a meal. ?  ?multivitamin with minerals Tabs tablet ?Take 1 tablet by mouth daily. ?  ?pantoprazole 40 MG tablet ?Commonly known as: PROTONIX ?Take 1 tablet (40 mg total) by mouth 2 (two) times daily. ?  ?potassium chloride 20 MEQ packet ?Commonly known as: Klor-Con ?Take 40 mEq by mouth daily. ?  ? ?  ? ?Allergies  ?Allergen Reactions  ? Bee Venom Anaphylaxis  ? ? ? ? ?The results of significant diagnostics from this hospitalization (including imaging, microbiology, ancillary and laboratory) are listed below for reference.   ? ?Significant Diagnostic Studies: ?CT ABDOMEN PELVIS W CONTRAST ? ?Result Date: 03/03/2022 ?CLINICAL DATA:  Abdominal pain, fever, elevated bilirubin, history of esophageal cancer EXAM: CT ABDOMEN AND PELVIS WITH CONTRAST TECHNIQUE: Multidetector CT imaging of the abdomen and pelvis was performed using the standard protocol following bolus administration of intravenous contrast. RADIATION DOSE REDUCTION: This exam was performed according to the departmental dose-optimization program which includes automated exposure control, adjustment of the mA and/or kV according to patient size and/or use of iterative reconstruction technique. CONTRAST:  142m OMNIPAQUE IOHEXOL 300 MG/ML  SOLN COMPARISON:  12/30/2021 FINDINGS: Lower chest: No acute pleural or parenchymal lung disease. Postsurgical changes from esophagectomy and gastric pull-through procedure. Hepatobiliary: Small calcified gallstones are layering dependently within the gallbladder. No gallbladder wall thickening or pericholecystic fluid to suggest cholecystitis. The liver is unremarkable. No biliary duct  dilation or evidence of choledocholithiasis. Pancreas: Unremarkable. No pancreatic ductal dilatation or surrounding inflammatory changes. Spleen: Normal in size without focal abnormality. Adrenals/Urinary Tract: Kidneys are stable without urinary tract calculi or obstructive uropathy. The adrenals and bladder are unremarkable. Stomach/Bowel: No bowel obstruction or ileus. Normal appendix right lower quadrant. Scattered sigmoid diverticulosis without diverticulitis. No bowel wall thickening or inflammatory change. Vascular/Lymphatic: Aortic atherosclerosis. No enlarged abdominal or pelvic lymph nodes. Reproductive: Stable marked enlargement of the prostate measuring up to 5.7 x 5.9 cm in transverse dimension. Other: No free fluid or free intraperitoneal gas. No abdominal wall hernia. Musculoskeletal: No acute or destructive bony lesions. Reconstructed images demonstrate no additional findings. IMPRESSION: 1. Cholelithiasis without cholecystitis. 2. Minimal sigmoid diverticulosis without diverticulitis. 3. Stable enlarged prostate. 4. Postsurgical changes from esophagectomy and gastric pull-through procedure. 5.  Aortic Atherosclerosis (ICD10-I70.0). Electronically Signed   By: MRanda NgoM.D.   On: 03/03/2022 16:15  ? ?DG Chest Port 1 View ? ?Result Date: 03/03/2022 ?CLINICAL DATA:  fever oncology immunosuppressed EXAM: PORTABLE CHEST 1 VIEW COMPARISON:  Radiograph 10/28/2021. FINDINGS: Chest port catheter tip overlies the superior cavoatrial junction. The cardiomediastinal silhouette is within normal limits. There is no focal airspace consolidation. There is no pleural effusion. No pneumothorax. There is no acute osseous abnormality. IMPRESSION: No evidence of acute cardiopulmonary disease. Electronically Signed   By:  Maurine Simmering M.D.   On: 03/03/2022 16:30   ? ?Microbiology: ?Recent Results (from the past 240 hour(s))  ?Culture, Blood     Status: None (Preliminary result)  ? Collection Time: 03/03/22 11:37 AM  ?  Specimen: BLOOD  ?Result Value Ref Range Status  ? Specimen Description BLOOD RIGHT ANTECUBITAL  Final  ? Special Requests   Final  ?  BOTTLES DRAWN AEROBIC AND ANAEROBIC Blood Culture adequate volume  ? Culture   F

## 2022-03-08 LAB — CULTURE, BLOOD (ROUTINE X 2)
Culture: NO GROWTH
Culture: NO GROWTH
Special Requests: ADEQUATE
Special Requests: ADEQUATE

## 2022-03-08 LAB — CULTURE, BLOOD (SINGLE)
Culture: NO GROWTH
Culture: NO GROWTH
Special Requests: ADEQUATE

## 2022-03-14 ENCOUNTER — Other Ambulatory Visit: Payer: Self-pay

## 2022-03-14 ENCOUNTER — Inpatient Hospital Stay: Payer: BC Managed Care – PPO

## 2022-03-14 DIAGNOSIS — C158 Malignant neoplasm of overlapping sites of esophagus: Secondary | ICD-10-CM

## 2022-03-14 DIAGNOSIS — E860 Dehydration: Secondary | ICD-10-CM

## 2022-03-14 DIAGNOSIS — Z5112 Encounter for antineoplastic immunotherapy: Secondary | ICD-10-CM | POA: Diagnosis not present

## 2022-03-14 DIAGNOSIS — D440 Neoplasm of uncertain behavior of thyroid gland: Secondary | ICD-10-CM | POA: Diagnosis not present

## 2022-03-14 DIAGNOSIS — Z79899 Other long term (current) drug therapy: Secondary | ICD-10-CM | POA: Diagnosis not present

## 2022-03-14 DIAGNOSIS — C779 Secondary and unspecified malignant neoplasm of lymph node, unspecified: Secondary | ICD-10-CM | POA: Diagnosis not present

## 2022-03-14 DIAGNOSIS — Z95828 Presence of other vascular implants and grafts: Secondary | ICD-10-CM

## 2022-03-14 LAB — CMP (CANCER CENTER ONLY)
ALT: 23 U/L (ref 0–44)
AST: 18 U/L (ref 15–41)
Albumin: 4 g/dL (ref 3.5–5.0)
Alkaline Phosphatase: 103 U/L (ref 38–126)
Anion gap: 5 (ref 5–15)
BUN: 12 mg/dL (ref 8–23)
CO2: 27 mmol/L (ref 22–32)
Calcium: 8.8 mg/dL — ABNORMAL LOW (ref 8.9–10.3)
Chloride: 107 mmol/L (ref 98–111)
Creatinine: 0.86 mg/dL (ref 0.61–1.24)
GFR, Estimated: >60 mL/min (ref >60)
Glucose, Bld: 137 mg/dL — ABNORMAL HIGH (ref 70–99)
Potassium: 3.8 mmol/L (ref 3.5–5.1)
Sodium: 139 mmol/L (ref 135–145)
Total Bilirubin: 0.6 mg/dL (ref 0.3–1.2)
Total Protein: 6.9 g/dL (ref 6.5–8.1)

## 2022-03-14 LAB — CBC WITH DIFFERENTIAL (CANCER CENTER ONLY)
Abs Immature Granulocytes: 0.02 10*3/uL (ref 0.00–0.07)
Basophils Absolute: 0.1 10*3/uL (ref 0.0–0.1)
Basophils Relative: 1 %
Eosinophils Absolute: 0.1 10*3/uL (ref 0.0–0.5)
Eosinophils Relative: 3 %
HCT: 36.2 % — ABNORMAL LOW (ref 39.0–52.0)
Hemoglobin: 12.6 g/dL — ABNORMAL LOW (ref 13.0–17.0)
Immature Granulocytes: 0 %
Lymphocytes Relative: 12 %
Lymphs Abs: 0.6 10*3/uL — ABNORMAL LOW (ref 0.7–4.0)
MCH: 31.8 pg (ref 26.0–34.0)
MCHC: 34.8 g/dL (ref 30.0–36.0)
MCV: 91.4 fL (ref 80.0–100.0)
Monocytes Absolute: 0.4 10*3/uL (ref 0.1–1.0)
Monocytes Relative: 7 %
Neutro Abs: 3.7 10*3/uL (ref 1.7–7.7)
Neutrophils Relative %: 77 %
Platelet Count: 207 10*3/uL (ref 150–400)
RBC: 3.96 MIL/uL — ABNORMAL LOW (ref 4.22–5.81)
RDW: 13.3 % (ref 11.5–15.5)
WBC Count: 4.9 10*3/uL (ref 4.0–10.5)
nRBC: 0 % (ref 0.0–0.2)

## 2022-03-14 LAB — TSH: TSH: 1.259 u[IU]/mL (ref 0.350–4.500)

## 2022-03-14 MED ORDER — HEPARIN SOD (PORK) LOCK FLUSH 100 UNIT/ML IV SOLN
500.0000 [IU] | Freq: Once | INTRAVENOUS | Status: AC
  Administered 2022-03-14: 500 [IU]

## 2022-03-14 MED ORDER — SODIUM CHLORIDE 0.9% FLUSH
10.0000 mL | Freq: Once | INTRAVENOUS | Status: AC
  Administered 2022-03-14: 10 mL

## 2022-03-15 LAB — T4: T4, Total: 8.7 ug/dL (ref 4.5–12.0)

## 2022-03-24 ENCOUNTER — Other Ambulatory Visit: Payer: Self-pay | Admitting: Hematology

## 2022-03-26 ENCOUNTER — Encounter: Payer: Self-pay | Admitting: Hematology

## 2022-03-26 NOTE — Progress Notes (Unsigned)
[place]   Telephone:(336) [phone] Fax:(336) [phone]   Clinic Follow up Note   Patient Care Team: [name] as PCP - General (Physician Assistant) [name], MD as Consulting Physician (Oncology) 03/27/2022  CHIEF COMPLAINT: Follow up esophagus cancer, immunotherapy, recent hospitalizaton  SUMMARY OF ONCOLOGIC HISTORY: Oncology History Overview Note   Cancer Staging  Malignant neoplasm of overlapping sites of esophagus [facility]) Staging form: Esophagus - Adenocarcinoma, AJCC 8th Edition - Clinical stage from 05/13/2021: Stage IVA (cT3, cN2, cM0) - Signed by [name], MD on 05/22/2021    Malignant neoplasm of overlapping sites of esophagus ([place])  04/18/2021 Imaging   ULTRASOUND OF HEAD/NECK SOFT TISSUES  IMPRESSION: No suspicious lymphadenopathy   04/19/2021 Procedure   EGD  Impression: - Esophageal polyp(s) were found. Biopsied. - Partially obstructing, likely malignant esophageal tumor was found in the distal esophagus. - Small hiatal hernia. - A single gastric polyp. Biopsied. - Normal duodenal bulb, first portion of the duodenum and second portion of the duodenum.   04/19/2021 Pathology Results   FINAL MICROSCOPIC DIAGNOSIS: Stomach, Biopsy - FUNDIC GLAND POLYP. Negative for dysplasia. NO Helicobacter pylori organisms seen on H and E stain. Esophagus- Distal, Biopsy - INVASIVE MODERATELY DIFFERENTIATED ADENOCARCINOMA OF THE ESOPHAGUS.  - HER2 Study by Immunostain: Negative (score 1+) Esophagus- Mid, Biopsy - INVASIVE MODERATELY DIFFERENTIATED ADENOCARCINOMA OF THE ESOPHAGUS. - HER2 Study by Immunostain: Negative (score 1+)   05/05/2021 Imaging   CT CAP  IMPRESSION: 1. The patient's known esophageal mass is not well visualized. There is mild irregular wall thickening of the distal esophagus. Correlate with previous clinical evaluation. 2. Indeterminate mildly enlarged high right paratracheal node near the thoracic inlet. No other mediastinal  adenopathy. 3. No evidence of metastatic disease in the abdomen or pelvis. 4. Stable small pulmonary nodules bilaterally consistent with benign findings. 5. Cholelithiasis, small renal cysts and Aortic Atherosclerosis (ICD10-I70.0).   05/09/2021 Initial Diagnosis   Malignant neoplasm of overlapping sites of esophagus ([place])    05/11/2021 Procedure   Upper EUS  Impression: - Mucosal nodule found in the esophagus. - Partially obstructing, malignant esophageal tumor was found at the gastroesophageal junction. - At least 3 discrete peritumoral lymph nodes - A mass was found in the gastroesophageal junction. A tissue diagnosis was obtained prior to this exam. This is of adenocarcinoma. This was staged T3 N2 Mx by endosonographic criteria. - Unable to traverse GE junction with either EGD scope or radial EUS scope.   05/13/2021 Cancer Staging   Staging form: Esophagus - Adenocarcinoma, AJCC 8th Edition - Clinical stage from 05/13/2021: Stage IVA (cT3, cN2, cM0) - Signed by [name], MD on 05/22/2021    05/16/2021 PET scan   IMPRESSION: 1. Distal esophageal mass measuring about 6.2 cm in length, maximum SUV 13.1. 2. Mildly hypermetabolic and mildly enlarged upper right paratracheal lymph node, 1.3 cm in short axis with maximum SUV 3.6, concerning for malignant involvement. 3. Hypodense 1.1 cm right thyroid nodule has a maximum SUV substantially above that of the background thyroid. A significant minority of such nodules can harbor thyroid cancer. Recommend thyroid US and biopsy (ref: J Am Coll Radiol. 2015 Feb;12(2): 143-50). 4. 3 by 4 mm right lower lobe pulmonary nodule is similar to 05/05/2021, not hypermetabolic but below sensitive PET-CT size thresholds. Surveillance recommended. 5. Other imaging findings of potential clinical significance: Chronic left maxillary sinusitis. Aortic Atherosclerosis (ICD10-I70.0). Cholelithiasis. Prostatomegaly.   05/24/2021 - 06/13/2021 Chemotherapy  05/26/2021 Pathology Results   FINAL MICROSCOPIC DIAGNOSIS:   A. LYMPH NODE, 2R, FINE NEEDLE ASPIRATION:  - Malignant cells consistent with non-small cell carcinoma   COMMENT:  The cells are positive for cytokeratin 20 and CDX-2 (patchy). TTF-1, NapsinA, cytokeratin 5/6, p40, S100, MelanA, and cytokeratin 7 are negative. The patient's history of esophageal adenocarcinoma is noted and CDX-2 positivity is consistent with a gastrointestinal primary. This cytokeratin7/20 profile is usually seen in lower tract tumors, but this pattern can be seen in gastric/esophageal cases.   05/27/2021 - 07/04/2021 Radiation Therapy   Per Dr. Isidore Moos   06/20/2021 - 08/08/2021 Chemotherapy   Patient is on Treatment Plan : GASTROESOPHAGEAL FOLFOX q14d x 6 cycles      08/04/2021 PET scan   IMPRESSION: 1. Interval development of a new 6 mm short axis right cervical node with hypermetabolism. Finding is somewhat indeterminate given the apparent response of the hypermetabolic right paratracheal lymphadenopathy seen on the previous study. Close follow-up recommended as metastatic disease not excluded. 2. Diffuse hypermetabolic FDG accumulation noted in the esophagus today with more diffuse circumferential esophageal wall thickening on today's study than on the previous exam. Hypermetabolism in the distal esophagus at the level of the hypermetabolic neoplasm previously has decreased from SUV max = 13 to SUV max = 7 today. 3. Stable tiny 3-4 mm right lower lobe and left upper lobe pulmonary nodules. Continued attention on follow-up recommended. 4. Cholelithiasis. 5. Emphysema.  (ICD10-J43.9) 6.  Aortic Atherosclerois (ICD10-170.0)   08/17/2021 Pathology Results   FINAL MICROSCOPIC DIAGNOSIS:  A. LYMPH NODE, RIGHT CERVICAL, FINE NEEDLE ASPIRATION:  - No malignant cells identified  - See comment   COMMENT:  There is scant cellularity and the specimen may not be representative.    09/05/2021 Definitive  Surgery   FINAL MICROSCOPIC DIAGNOSIS:   A. ESOPHAGOGASTRECTOMY:  - Invasive poorly differentiated adenocarcinoma.  - Metastatic carcinoma involving 5 of 13 lymph nodes (5/13).  - See oncology table below.   B. LYMPH NODE, LEVEL 8, EXCISION:  - One lymph node, negative for malignancy (0/1).   09/05/2021 Cancer Staging   Staging form: Esophagus - Adenocarcinoma, AJCC 8th Edition - Pathologic stage from 09/05/2021: Stage IIIB (ypT3, pN2, cM0, G3) - Signed by [name], MD on 12/03/2021 Stage prefix: Post-therapy Response to neoadjuvant therapy: Partial response Histologic grading system: 3 grade system Residual tumor (R): R0 - None    10/10/2021 -  Chemotherapy   Patient is on Treatment Plan : GASTROESOPHAGEAL Nivolumab q14d x 8 cycles / Nivolumab q28d      12/30/2021 Imaging   EXAM: CT CHEST, ABDOMEN, AND PELVIS WITH CONTRAST  IMPRESSION: Status post esophagectomy with gastric pull-through procedure. No evidence of recurrent or metastatic carcinoma within the chest, abdomen, or pelvis.   Cholelithiasis. No radiographic evidence of cholecystitis.   Stable enlarged prostate.   Aortic Atherosclerosis (ICD10-I70.0).     CURRENT THERAPY: Adjuvant Nivolumab starting 10/10/2021, currently q4 weeks  INTERVAL HISTORY: Mr. Wease returns for follow up as scheduled. Last seen by Dr. Burr Medico during hospitalization 03/04/22. He was admitted from our clinic for fever/chills and immunosurpression. Labs showed hyperbilirubinemia. CT was unremarkable. Cultures remained negative. He was discharged home 03/05/22. Repeat labs 03/14/22 showed stable anemia. Transaminitis and hyperbilirubinemia had resolved. He presents today to resume treatment.   He presents with his wife, feeling better since hospital discharge.  He denies recurrent fevers.  Energy fluctuates but remains out of bed and active at home, and continues working.  He gets bloated, gassy,  and nauseous if he overeats.  He has occasional  GERD flares but managed on Protonix 40 mg twice daily.  He questions if this dose is too high.  He got a note from Dr. Alessandra Bevels that he is due for EGD.  Denies pain, fever, chills, cough, chest pain, dyspnea, leg edema, or any other new specific complaints.  All other systems were reviewed with the patient and are negative.  MEDICAL HISTORY:  Past Medical History:  Diagnosis Date   Arthritis    BPH (benign prostatic hyperplasia)    Dyslipidemia    Elevated PSA    Esophageal cancer ([place])    Full dentures    GERD (gastroesophageal reflux disease)    History of melanoma excision    07/ 2011  s/p  wide excision left back--- per pathology-- negative maligant --  mild melanocytic atypia   History of palpitations    cardiologist --  dr Shelva Majestic (last note in epic 2014   Hypertension    Sleep apnea     SURGICAL HISTORY: Past Surgical History:  Procedure Laterality Date   COLONOSCOPY  10/24/2007   ESOPHAGOGASTRODUODENOSCOPY N/A 09/05/2021   Procedure: ESOPHAGOGASTRODUODENOSCOPY (EGD);  Surgeon: Lajuana Matte, MD;  Location: [facility] OR;  Service: Thoracic;  Laterality: N/A;   ESOPHAGOGASTRODUODENOSCOPY (EGD) WITH PROPOFOL N/A 05/11/2021   Procedure: ESOPHAGOGASTRODUODENOSCOPY (EGD) WITH PROPOFOL;  Surgeon: Arta Silence, MD;  Location: [facility];  Service: Endoscopy;  Laterality: N/A;   EUS N/A 05/11/2021   Procedure: ESOPHAGEAL ENDOSCOPIC ULTRASOUND (EUS) RADIAL;  Surgeon: Arta Silence, MD;  Location: [facility];  Service: Endoscopy;  Laterality: N/A;   EYE SURGERY     Lasik surgery on both eyes, but still needs glasses   FINE NEEDLE ASPIRATION  05/26/2021   Procedure: FINE NEEDLE ASPIRATION (FNA) LINEAR;  Surgeon: Garner Nash, DO;  Location: [facility];  Service: Pulmonary;;   INTERCOSTAL NERVE BLOCK  09/05/2021   Procedure: INTERCOSTAL NERVE BLOCK;  Surgeon: Lajuana Matte, MD;  Location: [facility];  Service: Thoracic;;   IR IMAGING GUIDED PORT INSERTION   06/30/2021   JEJUNOSTOMY  09/05/2021   Procedure: Shanon Rosser;  Surgeon: Lajuana Matte, MD;  Location: [facility];  Service: Thoracic;;   PROSTATE BIOPSY N/A 07/02/2015   Procedure: BIOPSY TRANSRECTAL ULTRASONIC PROSTATE (TUBP);  Surgeon: Lowella Bandy, MD;  Location[facility];  Service: Urology;  Laterality: N/A;   PROSTATE BIOPSY N/A 11/22/2015   Procedure: BIOPSY TRANSRECTAL ULTRASONIC PROSTATE (TUBP);  Surgeon: Cleon Gustin, MD;  Location: [facility];  Service: Urology;  Laterality: N/A;   TRANSTHORACIC ECHOCARDIOGRAM  11/19/2012   normal echo/  trivial TR   VIDEO BRONCHOSCOPY WITH ENDOBRONCHIAL ULTRASOUND N/A 05/26/2021   Procedure: VIDEO BRONCHOSCOPY WITH ENDOBRONCHIAL ULTRASOUND;  Surgeon: Garner Nash, DO;  Location: [facility];  Service: Pulmonary;  Laterality: N/A;   WIDE EXCISION MELANOMA LEFT BACK  05/02/2010    I have reviewed the social history and family history with the patient and they are unchanged from previous note.  ALLERGIES:  is allergic to bee venom.  MEDICATIONS:  Current Outpatient Medications  Medication Sig Dispense Refill   acetaminophen (TYLENOL) 500 MG tablet Take 1,000 mg by mouth every 6 (six) hours as needed for fever.     aspirin EC 81 MG tablet Take 81 mg by mouth in the morning.     ketoconazole (NIZORAL) 2 % cream APPLY 1 APPLICATION TOPICALLY DAILY 15 g 0   lidocaine-prilocaine (EMLA) cream Apply 1 application  topically as needed. (Patient not taking: Reported on 03/03/2022) 30 g 0   metoprolol succinate (TOPROL-XL) 50 MG 24 hr tablet Take 50 mg by mouth daily. Take with or immediately following a meal.     Multiple Vitamin (MULTIVITAMIN WITH MINERALS) TABS tablet Take 1 tablet by mouth daily.     pantoprazole (PROTONIX) 40 MG tablet Take 1 tablet (40 mg total) by mouth 2 (two) times daily. 30 tablet 1   potassium chloride (KLOR-CON) 20 MEQ packet Take 40 mEq by mouth daily. 4 packet 0   Skin Protectants, Misc.  (EUCERIN) cream Apply 1 application. topically daily as needed for dry skin.     No current facility-administered medications for this visit.    PHYSICAL EXAMINATION: ECOG PERFORMANCE STATUS: 1 - Symptomatic but completely ambulatory  Vitals:   03/27/22 0932  BP: (!) 152/83  Pulse: 63  Resp: 16  Temp: 97.9 F (36.6 C)  SpO2: 100%   Filed Weights   03/27/22 0932  Weight: 143 lb 14.4 oz (65.3 kg)    GENERAL:alert, no distress and comfortable SKIN: no rash  EYES:  sclera clear LUNGS: clear with normal breathing effort HEART: regular rate & rhythm, no lower extremity edema ABDOMEN: abdomen soft, non-tender and normal bowel sounds NEURO: alert & oriented x 3 with fluent speech, no focal motor/sensory deficits PAC without erythema   LABORATORY DATA:  I have reviewed the data as listed    Latest Ref Rng & Units 03/27/2022    9:16 AM 03/14/2022    2:18 PM 03/05/2022    6:20 AM  CBC  WBC 4.0 - 10.5 K/uL 2.8   4.9   3.4    Hemoglobin 13.0 - 17.0 g/dL 12.5   12.6   12.4    Hematocrit 39.0 - 52.0 % 36.1   36.2   35.6    Platelets 150 - 400 K/uL 126   207   123          Latest Ref Rng & Units 03/27/2022    9:16 AM 03/14/2022    2:18 PM 03/05/2022    6:20 AM  CMP  Glucose 70 - 99 mg/dL 93   137   99    BUN 8 - 23 mg/dL _0 Creatinine 0.61 - 1.24 mg/dL 0.74   0.86   0.72    Sodium 135 - 145 mmol/L 139   139   141    Potassium 3.5 - 5.1 mmol/L 3.8   3.8   4.0    Chloride 98 - 111 mmol/L 105   107   109    CO2 22 - 32 mmol/L _1 Calcium 8.9 - 10.3 mg/dL 9.4   8.8   9.0    Total Protein 6.5 - 8.1 g/dL 6.8   6.9   7.0    Total Bilirubin 0.3 - 1.2 mg/dL 0.7   0.6   1.4    Alkaline Phos 38 - 126 U/L 96   103   140    AST 15 - 41 U/L 18   18   33    ALT 0 - 44 U/L 16   23   68        RADIOGRAPHIC STUDIES: I have personally reviewed the radiological images as listed and agreed with the findings in the report. No results found.   ASSESSMENT & PLAN:  Nathaniel Jennings is a  66 y.o. male with    1. Two esophageal Adenocarcinoma in mid and distal esophagus, distal cT3N2Mx with upper mediastinal adenopathy -He was initially referred to [facility] for dysphasia with solid food and regurgitation. -EGD performed by Dr. Alessandra Bevels on 04/19/21 showed a partially obstructing, likely malignant, esophageal tumor in the distal esophagus and a polyp in the mid esophagus. Biopsy of both confirmed invasive moderately differentiated adenocarcinoma of the esophagus, Her2 negative. -CT CAP on 05/05/21 showing: esophageal mass not well visualized; indeterminate mildly-enlarged high right paratracheal node near thoracic inlet; no other mediastinal adenopathy or evidence of metastatic disease in abdomen or pelvis; stable small pulmonary nodules bilaterally. -EUS 05/11/21 staged this as T3 N2 Mx. -PET scan 05/16/21 showed: 6.2 cm distal esophageal mass; hypermetabolic upper right paratracheal lymph node.  No other distant metastasis.  -EBUS biopsy on 05/26/21, path from 2R lymph node confirmed malignant cells consistent with his primary esophageal cancer. This node is not resectable by surgery  -He began concurrent chemoRT with weekly TC on 05/24/21. He completed radiation therapy on 07/04/21. -We changed his chemo to FOLFOX on 06/20/21, at reduced dose for first cycle due to mild neutropenia and concurrent RT. Tolerated well. He completed 4 doses -restaging PET 08/04/21 showed showed excellent response  -s/p esophagogastrectomy 09/05/21 by Dr. Kipp Brood; path showed invasive poorly differentiated adenocarcinoma, rare single tumor cells presents. Margins negative. Metastatic carcinoma involving 5 out of 14 (5/14) LNs -He began adjuvant nivolumab 10/10/21. Tolerating well except GERD flare. Now q4 weeks -Mr. Fernandez appears stable. I reviewed his hospital course. He was admitted for fever and hyperbilirubinemia. CT was unremarkable and showed no signs of recurrent disease. Infectious  source was never identified. Labs normalized. He has recovered well -labs adequate to resume q4 weeks Nivo today. He tolerates treatment well without significant SE's.  -f/up in 4 weeks with next cycle    2. Severe dysphagia and odynophagia with weight loss -He lost about 15 lbs from April to July 2022. -secondary to #1 and esophagitis from RT -He required Hycet, lidocaine, and Carafate -resolved   3. Hypermetabolic thyroid nodule -seen on PET 05/16/21 (and not on head/neck US on 04/18/21) -repeat head/neck US on 06/03/21 showed a 1.1 cm inferior right nodule, corresponding to hypermetabolic focus on PET.  -Biopsy scheduled 08/16/2021   4. HTN and BPH -continue medication, will monitor his blood pressure closely during treatment.     PLAN: [facility] course and today's labs reviewed -Proceed with Opdivo today as planned, continue every 4 weeks  -message to Dr. Jacinto Reap. Re: EGD timeline -Message to insurance rep/prior Auth regarding 08/08/21 EOB and balance -Follow-up in 4 weeks prior to next cycle   All questions were answered. The patient knows to call the clinic with any problems, questions or concerns. No barriers to learning was detected. I spent 20 minutes counseling the patient face to face. The total time spent in the appointment was 30 minutes and more than 50% was on counseling and review of test results and coordination of care.      Alla Feeling, NP 03/27/22

## 2022-03-27 ENCOUNTER — Encounter: Payer: Self-pay | Admitting: Nurse Practitioner

## 2022-03-27 ENCOUNTER — Inpatient Hospital Stay: Payer: BC Managed Care – PPO

## 2022-03-27 ENCOUNTER — Other Ambulatory Visit: Payer: Self-pay

## 2022-03-27 ENCOUNTER — Inpatient Hospital Stay: Payer: BC Managed Care – PPO | Attending: Hematology | Admitting: Nurse Practitioner

## 2022-03-27 VITALS — BP 152/83 | HR 63 | Temp 97.9°F | Resp 16 | Ht 66.0 in | Wt 143.9 lb

## 2022-03-27 DIAGNOSIS — C158 Malignant neoplasm of overlapping sites of esophagus: Secondary | ICD-10-CM

## 2022-03-27 DIAGNOSIS — Z5112 Encounter for antineoplastic immunotherapy: Secondary | ICD-10-CM | POA: Diagnosis not present

## 2022-03-27 DIAGNOSIS — Z79899 Other long term (current) drug therapy: Secondary | ICD-10-CM | POA: Insufficient documentation

## 2022-03-27 DIAGNOSIS — E860 Dehydration: Secondary | ICD-10-CM

## 2022-03-27 DIAGNOSIS — Z95828 Presence of other vascular implants and grafts: Secondary | ICD-10-CM

## 2022-03-27 LAB — CBC WITH DIFFERENTIAL (CANCER CENTER ONLY)
Abs Immature Granulocytes: 0.01 10*3/uL (ref 0.00–0.07)
Basophils Absolute: 0 10*3/uL (ref 0.0–0.1)
Basophils Relative: 1 %
Eosinophils Absolute: 0.1 10*3/uL (ref 0.0–0.5)
Eosinophils Relative: 2 %
HCT: 36.1 % — ABNORMAL LOW (ref 39.0–52.0)
Hemoglobin: 12.5 g/dL — ABNORMAL LOW (ref 13.0–17.0)
Immature Granulocytes: 0 %
Lymphocytes Relative: 22 %
Lymphs Abs: 0.6 10*3/uL — ABNORMAL LOW (ref 0.7–4.0)
MCH: 31.6 pg (ref 26.0–34.0)
MCHC: 34.6 g/dL (ref 30.0–36.0)
MCV: 91.4 fL (ref 80.0–100.0)
Monocytes Absolute: 0.4 10*3/uL (ref 0.1–1.0)
Monocytes Relative: 14 %
Neutro Abs: 1.7 10*3/uL (ref 1.7–7.7)
Neutrophils Relative %: 61 %
Platelet Count: 126 10*3/uL — ABNORMAL LOW (ref 150–400)
RBC: 3.95 MIL/uL — ABNORMAL LOW (ref 4.22–5.81)
RDW: 13.2 % (ref 11.5–15.5)
WBC Count: 2.8 10*3/uL — ABNORMAL LOW (ref 4.0–10.5)
nRBC: 0 % (ref 0.0–0.2)

## 2022-03-27 LAB — CMP (CANCER CENTER ONLY)
ALT: 16 U/L (ref 0–44)
AST: 18 U/L (ref 15–41)
Albumin: 4.2 g/dL (ref 3.5–5.0)
Alkaline Phosphatase: 96 U/L (ref 38–126)
Anion gap: 6 (ref 5–15)
BUN: 15 mg/dL (ref 8–23)
CO2: 28 mmol/L (ref 22–32)
Calcium: 9.4 mg/dL (ref 8.9–10.3)
Chloride: 105 mmol/L (ref 98–111)
Creatinine: 0.74 mg/dL (ref 0.61–1.24)
GFR, Estimated: >60 mL/min (ref >60)
Glucose, Bld: 93 mg/dL (ref 70–99)
Potassium: 3.8 mmol/L (ref 3.5–5.1)
Sodium: 139 mmol/L (ref 135–145)
Total Bilirubin: 0.7 mg/dL (ref 0.3–1.2)
Total Protein: 6.8 g/dL (ref 6.5–8.1)

## 2022-03-27 LAB — TSH: TSH: 2.378 u[IU]/mL (ref 0.350–4.500)

## 2022-03-27 MED ORDER — SODIUM CHLORIDE 0.9% FLUSH
10.0000 mL | INTRAVENOUS | Status: DC | PRN
  Administered 2022-03-27: 10 mL

## 2022-03-27 MED ORDER — HEPARIN SOD (PORK) LOCK FLUSH 100 UNIT/ML IV SOLN
500.0000 [IU] | Freq: Once | INTRAVENOUS | Status: AC | PRN
  Administered 2022-03-27: 500 [IU]

## 2022-03-27 MED ORDER — SODIUM CHLORIDE 0.9% FLUSH
10.0000 mL | Freq: Once | INTRAVENOUS | Status: AC
  Administered 2022-03-27: 10 mL

## 2022-03-27 MED ORDER — SODIUM CHLORIDE 0.9 % IV SOLN
480.0000 mg | Freq: Once | INTRAVENOUS | Status: AC
  Administered 2022-03-27: 480 mg via INTRAVENOUS
  Filled 2022-03-27: qty 48

## 2022-03-27 MED ORDER — SODIUM CHLORIDE 0.9 % IV SOLN: Freq: Once | INTRAVENOUS | Status: AC

## 2022-03-27 NOTE — Patient Instructions (Signed)
[facility]COL[facility]  Discharge Instructions: ?Thank you for choosing [facility] to provide your oncology and hematology care.  ? ?If you have a lab appointment with the Cancer Center, please go directly to the Cancer Center and check in at the registration area. ?  ?Wear comfortable clothing and clothing appropriate for easy access to any Portacath or PICC line.  ? ?We strive to give you quality time with your provider. You may need to reschedule your appointment if you arrive late (15 or more minutes).  Arriving late affects you and other patients whose appointments are after yours.  Also, if you miss three or more appointments without notifying the office, you may be dismissed from the clinic at the provider?s discretion.    ?  ?For prescription refill requests, have your pharmacy contact our office and allow 72 hours for refills to be completed.   ? ?Today you received the following chemotherapy and/or immunotherapy agents Opdivo    ?  ?To help prevent nausea and vomiting after your treatment, we encourage you to take your nausea medication as directed. ? ?BELOW ARE SYMPTOMS THAT SHOULD BE REPORTED IMMEDIATELY: ?*FEVER GREATER THAN 100.4 F (38 ?C) OR HIGHER ?*CHILLS OR SWEATING ?*NAUSEA AND VOMITING THAT IS NOT CONTROLLED WITH YOUR NAUSEA MEDICATION ?*UNUSUAL SHORTNESS OF BREATH ?*UNUSUAL BRUISING OR BLEEDING ?*URINARY PROBLEMS (pain or burning when urinating, or frequent urination) ?*BOWEL PROBLEMS (unusual diarrhea, constipation, pain near the anus) ?TENDERNESS IN MOUTH AND THROAT WITH OR WITHOUT PRESENCE OF ULCERS (sore throat, sores in mouth, or a toothache) ?UNUSUAL RASH, SWELLING OR PAIN  ?UNUSUAL VAGINAL DISCHARGE OR ITCHING  ? ?Items with * indicate a potential emergency and should be followed up as soon as possible or go to the Emergency Department if any problems should occur. ? ?Please show the CHEMOTHERAPY ALERT CARD or IMMUNOTHERAPY ALERT CARD at check-in to the  Emergency Department and triage nurse. ? ?Should you have questions after your visit or need to cancel or reschedule your appointment, please contact [facility]  Dept: [phone]  and follow the prompts.  Office hours are 8:00 a.m. to 4:30 p.m. Monday - Friday. Please note that voicemails left after 4:00 p.m. may not be returned until the following business day.  We are closed weekends and major holidays. You have access to a nurse at all times for urgent questions. Please call the main number to the clinic Dept: [phone] and follow the prompts. ? ? ?For any non-urgent questions, you may also contact your provider using MyChart. We now offer e-Visits for anyone 18 and older to request care online for non-urgent symptoms. For details visit [email or web address]. ?  ?Also download the MyChart app! Go to the app store, search "MyChart", open the app, select [place], and log in with your MyChart username and password. ? ?Due to Covid, a mask is required upon entering the hospital/clinic. If you do not have a mask, one will be given to you upon arrival. For doctor visits, patients may have 1 support person aged 18 or older with them. For treatment visits, patients cannot have anyone with them due to current Covid guidelines and our immunocompromised population.  ? ?

## 2022-03-28 LAB — T4: T4, Total: 8.4 ug/dL (ref 4.5–12.0)

## 2022-03-29 DIAGNOSIS — R972 Elevated prostate specific antigen [PSA]: Secondary | ICD-10-CM | POA: Diagnosis not present

## 2022-03-30 DIAGNOSIS — G4730 Sleep apnea, unspecified: Secondary | ICD-10-CM | POA: Diagnosis not present

## 2022-04-05 DIAGNOSIS — R972 Elevated prostate specific antigen [PSA]: Secondary | ICD-10-CM | POA: Diagnosis not present

## 2022-04-05 DIAGNOSIS — N401 Enlarged prostate with lower urinary tract symptoms: Secondary | ICD-10-CM | POA: Diagnosis not present

## 2022-04-05 DIAGNOSIS — R351 Nocturia: Secondary | ICD-10-CM | POA: Diagnosis not present

## 2022-04-14 ENCOUNTER — Telehealth: Payer: Self-pay | Admitting: Hematology

## 2022-04-14 NOTE — Telephone Encounter (Signed)
Left message with rescheduled upcoming appointment due to provider on PAL.

## 2022-04-24 ENCOUNTER — Ambulatory Visit: Payer: BC Managed Care – PPO

## 2022-04-24 ENCOUNTER — Inpatient Hospital Stay: Payer: BC Managed Care – PPO | Attending: Hematology

## 2022-04-24 ENCOUNTER — Inpatient Hospital Stay (HOSPITAL_BASED_OUTPATIENT_CLINIC_OR_DEPARTMENT_OTHER): Payer: BC Managed Care – PPO | Admitting: Physician Assistant

## 2022-04-24 ENCOUNTER — Ambulatory Visit: Payer: BC Managed Care – PPO | Admitting: Hematology

## 2022-04-24 ENCOUNTER — Other Ambulatory Visit: Payer: Self-pay

## 2022-04-24 ENCOUNTER — Other Ambulatory Visit: Payer: BC Managed Care – PPO

## 2022-04-24 ENCOUNTER — Inpatient Hospital Stay: Payer: BC Managed Care – PPO

## 2022-04-24 VITALS — BP 148/79 | HR 74 | Temp 99.0°F | Resp 17 | Ht 66.0 in | Wt 146.9 lb

## 2022-04-24 DIAGNOSIS — Z5112 Encounter for antineoplastic immunotherapy: Secondary | ICD-10-CM | POA: Insufficient documentation

## 2022-04-24 DIAGNOSIS — Z79899 Other long term (current) drug therapy: Secondary | ICD-10-CM | POA: Diagnosis not present

## 2022-04-24 DIAGNOSIS — E860 Dehydration: Secondary | ICD-10-CM

## 2022-04-24 DIAGNOSIS — C778 Secondary and unspecified malignant neoplasm of lymph nodes of multiple regions: Secondary | ICD-10-CM | POA: Diagnosis not present

## 2022-04-24 DIAGNOSIS — Z95828 Presence of other vascular implants and grafts: Secondary | ICD-10-CM

## 2022-04-24 DIAGNOSIS — C158 Malignant neoplasm of overlapping sites of esophagus: Secondary | ICD-10-CM

## 2022-04-24 LAB — CBC WITH DIFFERENTIAL (CANCER CENTER ONLY)
Abs Immature Granulocytes: 0.01 10*3/uL (ref 0.00–0.07)
Basophils Absolute: 0 10*3/uL (ref 0.0–0.1)
Basophils Relative: 1 %
Eosinophils Absolute: 0.1 10*3/uL (ref 0.0–0.5)
Eosinophils Relative: 1 %
HCT: 36.4 % — ABNORMAL LOW (ref 39.0–52.0)
Hemoglobin: 12.8 g/dL — ABNORMAL LOW (ref 13.0–17.0)
Immature Granulocytes: 0 %
Lymphocytes Relative: 10 %
Lymphs Abs: 0.7 10*3/uL (ref 0.7–4.0)
MCH: 31.8 pg (ref 26.0–34.0)
MCHC: 35.2 g/dL (ref 30.0–36.0)
MCV: 90.5 fL (ref 80.0–100.0)
Monocytes Absolute: 0.5 10*3/uL (ref 0.1–1.0)
Monocytes Relative: 8 %
Neutro Abs: 5.2 10*3/uL (ref 1.7–7.7)
Neutrophils Relative %: 80 %
Platelet Count: 168 10*3/uL (ref 150–400)
RBC: 4.02 MIL/uL — ABNORMAL LOW (ref 4.22–5.81)
RDW: 13.1 % (ref 11.5–15.5)
WBC Count: 6.5 10*3/uL (ref 4.0–10.5)
nRBC: 0 % (ref 0.0–0.2)

## 2022-04-24 LAB — CMP (CANCER CENTER ONLY)
ALT: 14 U/L (ref 0–44)
AST: 15 U/L (ref 15–41)
Albumin: 4.2 g/dL (ref 3.5–5.0)
Alkaline Phosphatase: 90 U/L (ref 38–126)
Anion gap: 5 (ref 5–15)
BUN: 15 mg/dL (ref 8–23)
CO2: 28 mmol/L (ref 22–32)
Calcium: 9.1 mg/dL (ref 8.9–10.3)
Chloride: 105 mmol/L (ref 98–111)
Creatinine: 0.86 mg/dL (ref 0.61–1.24)
GFR, Estimated: >60 mL/min (ref >60)
Glucose, Bld: 130 mg/dL — ABNORMAL HIGH (ref 70–99)
Potassium: 3.9 mmol/L (ref 3.5–5.1)
Sodium: 138 mmol/L (ref 135–145)
Total Bilirubin: 0.6 mg/dL (ref 0.3–1.2)
Total Protein: 7 g/dL (ref 6.5–8.1)

## 2022-04-24 LAB — TSH: TSH: 1.85 u[IU]/mL (ref 0.350–4.500)

## 2022-04-24 MED ORDER — SODIUM CHLORIDE 0.9 % IV SOLN: Freq: Once | INTRAVENOUS | Status: AC

## 2022-04-24 MED ORDER — SODIUM CHLORIDE 0.9% FLUSH
10.0000 mL | INTRAVENOUS | Status: DC | PRN
  Administered 2022-04-24: 10 mL

## 2022-04-24 MED ORDER — SODIUM CHLORIDE 0.9 % IV SOLN
480.0000 mg | Freq: Once | INTRAVENOUS | Status: AC
  Administered 2022-04-24: 480 mg via INTRAVENOUS
  Filled 2022-04-24: qty 48

## 2022-04-24 MED ORDER — HEPARIN SOD (PORK) LOCK FLUSH 100 UNIT/ML IV SOLN
500.0000 [IU] | Freq: Once | INTRAVENOUS | Status: AC | PRN
  Administered 2022-04-24: 500 [IU]

## 2022-04-24 MED ORDER — SODIUM CHLORIDE 0.9% FLUSH
10.0000 mL | Freq: Once | INTRAVENOUS | Status: AC
  Administered 2022-04-24: 10 mL

## 2022-04-24 NOTE — Patient Instructions (Signed)
[place] [facility]COL[facility]  Discharge Instructions: ?Thank you for choosing [facility] to provide your oncology and hematology care.  ? ?If you have a lab appointment with the Cancer Center, please go directly to the Cancer Center and check in at the registration area. ?  ?Wear comfortable clothing and clothing appropriate for easy access to any Portacath or PICC line.  ? ?We strive to give you quality time with your provider. You may need to reschedule your appointment if you arrive late (15 or more minutes).  Arriving late affects you and other patients whose appointments are after yours.  Also, if you miss three or more appointments without notifying the office, you may be dismissed from the clinic at the provider?s discretion.    ?  ?For prescription refill requests, have your pharmacy contact our office and allow 72 hours for refills to be completed.   ? ?Today you received the following chemotherapy and/or immunotherapy agents Opdivo    ?  ?To help prevent nausea and vomiting after your treatment, we encourage you to take your nausea medication as directed. ? ?BELOW ARE SYMPTOMS THAT SHOULD BE REPORTED IMMEDIATELY: ?*FEVER GREATER THAN 100.4 F (38 ?C) OR HIGHER ?*CHILLS OR SWEATING ?*NAUSEA AND VOMITING THAT IS NOT CONTROLLED WITH YOUR NAUSEA MEDICATION ?*UNUSUAL SHORTNESS OF BREATH ?*UNUSUAL BRUISING OR BLEEDING ?*URINARY PROBLEMS (pain or burning when urinating, or frequent urination) ?*BOWEL PROBLEMS (unusual diarrhea, constipation, pain near the anus) ?TENDERNESS IN MOUTH AND THROAT WITH OR WITHOUT PRESENCE OF ULCERS (sore throat, sores in mouth, or a toothache) ?UNUSUAL RASH, SWELLING OR PAIN  ?UNUSUAL VAGINAL DISCHARGE OR ITCHING  ? ?Items with * indicate a potential emergency and should be followed up as soon as possible or go to the Emergency Department if any problems should occur. ? ?Please show the CHEMOTHERAPY ALERT CARD or IMMUNOTHERAPY ALERT CARD at check-in to the  Emergency Department and triage nurse. ? ?Should you have questions after your visit or need to cancel or reschedule your appointment, please contact [facility]  Dept: [phone]  and follow the prompts.  Office hours are 8:00 a.m. to 4:30 p.m. Monday - Friday. Please note that voicemails left after 4:00 p.m. may not be returned until the following business day.  We are closed weekends and major holidays. You have access to a nurse at all times for urgent questions. Please call the main number to the clinic Dept: [phone] and follow the prompts. ? ? ?For any non-urgent questions, you may also contact your provider using MyChart. We now offer e-Visits for anyone 18 and older to request care online for non-urgent symptoms. For details visit [email or web address]. ?  ?Also download the MyChart app! Go to the app store, search "MyChart", open the app, select [place], and log in with your MyChart username and password. ? ?Due to Covid, a mask is required upon entering the hospital/clinic. If you do not have a mask, one will be given to you upon arrival. For doctor visits, patients may have 1 support Nathaniel Jennings aged 18 or older with them. For treatment visits, patients cannot have anyone with them due to current Covid guidelines and our immunocompromised population.  ? ?

## 2022-04-24 NOTE — Progress Notes (Signed)
[name]   [phone] [phone]   Clinic Follow up Note   Patient Care Team: [name] as PCP - General (Physician Assistant) [name], MD as Consulting Physician (Oncology) 04/24/2022  CHIEF COMPLAINT: Follow up esophagus cancer, immunotherapy, recent hospitalizaton  SUMMARY OF ONCOLOGIC HISTORY: Oncology History Overview Note   Cancer Staging  Malignant neoplasm of overlapping sites of esophagus [facility]) Staging form: Esophagus - Adenocarcinoma, AJCC 8th Edition - Clinical stage from 05/13/2021: Stage IVA (cT3, cN2, cM0) - Signed by [name], MD on 05/22/2021    Malignant neoplasm of overlapping sites of esophagus ([name])  04/18/2021 Imaging   ULTRASOUND OF HEAD/NECK SOFT TISSUES  IMPRESSION: No suspicious lymphadenopathy   04/19/2021 Procedure   EGD  Impression: - Esophageal polyp(s) were found. Biopsied. - Partially obstructing, likely malignant esophageal tumor was found in the distal esophagus. - Small hiatal hernia. - A single gastric polyp. Biopsied. - Normal duodenal bulb, first portion of the duodenum and second portion of the duodenum.   04/19/2021 Pathology Results   FINAL MICROSCOPIC DIAGNOSIS: Stomach, Biopsy - FUNDIC GLAND POLYP. Negative for dysplasia. NO Helicobacter pylori organisms seen on H and E stain. Esophagus- Distal, Biopsy - INVASIVE MODERATELY DIFFERENTIATED ADENOCARCINOMA OF THE ESOPHAGUS.  - HER2 Study by Immunostain: Negative (score 1+) Esophagus- Mid, Biopsy - INVASIVE MODERATELY DIFFERENTIATED ADENOCARCINOMA OF THE ESOPHAGUS. - HER2 Study by Immunostain: Negative (score 1+)   05/05/2021 Imaging   CT CAP  IMPRESSION: 1. The patient's known esophageal mass is not well visualized. There is mild irregular wall thickening of the distal esophagus. Correlate with previous clinical evaluation. 2. Indeterminate mildly enlarged high right paratracheal node near the thoracic inlet. No other mediastinal  adenopathy. 3. No evidence of metastatic disease in the abdomen or pelvis. 4. Stable small pulmonary nodules bilaterally consistent with benign findings. 5. Cholelithiasis, small renal cysts and Aortic Atherosclerosis (ICD10-I70.0).   05/09/2021 Initial Diagnosis   Malignant neoplasm of overlapping sites of esophagus ([place])   05/11/2021 Procedure   Upper EUS  Impression: - Mucosal nodule found in the esophagus. - Partially obstructing, malignant esophageal tumor was found at the gastroesophageal junction. - At least 3 discrete peritumoral lymph nodes - A mass was found in the gastroesophageal junction. A tissue diagnosis was obtained prior to this exam. This is of adenocarcinoma. This was staged T3 N2 Mx by endosonographic criteria. - Unable to traverse GE junction with either EGD scope or radial EUS scope.   05/13/2021 Cancer Staging   Staging form: Esophagus - Adenocarcinoma, AJCC 8th Edition - Clinical stage from 05/13/2021: Stage IVA (cT3, cN2, cM0) - Signed by [name], MD on 05/22/2021   05/16/2021 PET scan   IMPRESSION: 1. Distal esophageal mass measuring about 6.2 cm in length, maximum SUV 13.1. 2. Mildly hypermetabolic and mildly enlarged upper right paratracheal lymph node, 1.3 cm in short axis with maximum SUV 3.6, concerning for malignant involvement. 3. Hypodense 1.1 cm right thyroid nodule has a maximum SUV substantially above that of the background thyroid. A significant minority of such nodules can harbor thyroid cancer. Recommend thyroid US and biopsy (ref: J Am Coll Radiol. 2015 Feb;12(2): 143-50). 4. 3 by 4 mm right lower lobe pulmonary nodule is similar to 05/05/2021, not hypermetabolic but below sensitive PET-CT size thresholds. Surveillance recommended. 5. Other imaging findings of potential clinical significance: Chronic left maxillary sinusitis. Aortic Atherosclerosis (ICD10-I70.0). Cholelithiasis. Prostatomegaly.   05/24/2021 - 06/13/2021 Chemotherapy          05/26/2021 Pathology Results  FINAL MICROSCOPIC DIAGNOSIS:   A. LYMPH NODE, 2R, FINE NEEDLE ASPIRATION:  - Malignant cells consistent with non-small cell carcinoma   COMMENT:  The cells are positive for cytokeratin 20 and CDX-2 (patchy). TTF-1, NapsinA, cytokeratin 5/6, p40, S100, MelanA, and cytokeratin 7 are negative. The patient's history of esophageal adenocarcinoma is noted and CDX-2 positivity is consistent with a gastrointestinal primary. This cytokeratin7/20 profile is usually seen in lower tract tumors, but this pattern can be seen in gastric/esophageal cases.   05/27/2021 - 07/04/2021 Radiation Therapy   Per Dr. Isidore Moos   06/20/2021 - 08/08/2021 Chemotherapy   Patient is on Treatment Plan : GASTROESOPHAGEAL FOLFOX q14d x 6 cycles     08/04/2021 PET scan   IMPRESSION: 1. Interval development of a new 6 mm short axis right cervical node with hypermetabolism. Finding is somewhat indeterminate given the apparent response of the hypermetabolic right paratracheal lymphadenopathy seen on the previous study. Close follow-up recommended as metastatic disease not excluded. 2. Diffuse hypermetabolic FDG accumulation noted in the esophagus today with more diffuse circumferential esophageal wall thickening on today's study than on the previous exam. Hypermetabolism in the distal esophagus at the level of the hypermetabolic neoplasm previously has decreased from SUV max = 13 to SUV max = 7 today. 3. Stable tiny 3-4 mm right lower lobe and left upper lobe pulmonary nodules. Continued attention on follow-up recommended. 4. Cholelithiasis. 5. Emphysema.  (ICD10-J43.9) 6.  Aortic Atherosclerois (ICD10-170.0)   08/17/2021 Pathology Results   FINAL MICROSCOPIC DIAGNOSIS:  A. LYMPH NODE, RIGHT CERVICAL, FINE NEEDLE ASPIRATION:  - No malignant cells identified  - See comment   COMMENT:  There is scant cellularity and the specimen may not be representative.    09/05/2021 Definitive Surgery   FINAL  MICROSCOPIC DIAGNOSIS:   A. ESOPHAGOGASTRECTOMY:  - Invasive poorly differentiated adenocarcinoma.  - Metastatic carcinoma involving 5 of 13 lymph nodes (5/13).  - See oncology table below.   B. LYMPH NODE, LEVEL 8, EXCISION:  - One lymph node, negative for malignancy (0/1).   09/05/2021 Cancer Staging   Staging form: Esophagus - Adenocarcinoma, AJCC 8th Edition - Pathologic stage from 09/05/2021: Stage IIIB (ypT3, pN2, cM0, G3) - Signed by [name], MD on 12/03/2021 Stage prefix: Post-therapy Response to neoadjuvant therapy: Partial response Histologic grading system: 3 grade system Residual tumor (R): R0 - None   10/10/2021 -  Chemotherapy   Patient is on Treatment Plan : GASTROESOPHAGEAL Nivolumab q14d x 8 cycles / Nivolumab q28d     12/30/2021 Imaging   EXAM: CT CHEST, ABDOMEN, AND PELVIS WITH CONTRAST  IMPRESSION: Status post esophagectomy with gastric pull-through procedure. No evidence of recurrent or metastatic carcinoma within the chest, abdomen, or pelvis.   Cholelithiasis. No radiographic evidence of cholecystitis.   Stable enlarged prostate.   Aortic Atherosclerosis (ICD10-I70.0).     CURRENT THERAPY: Adjuvant Nivolumab starting 10/10/2021, currently q4 weeks  INTERVAL HISTORY: Mr. Demartin returns for follow up as scheduled. Last seen by Cira Rue NP on 03/27/2022. He is accompanied by his wife for this visit.   Mr. Dhaliwal reports that he continues to feel better since his hospital discharge. He reports that his energy levels back to baseline. He is able to complete all his daily activities on his own. He denies any appetite changes.  He does become nauseous and have indigestion if he overeats.  He continues to take Protonix 40 mg twice daily that helps manage his acid reflux.  He denies any bowel habit changes including recurrent  episodes of diarrhea or constipation.  Patient denies easy bruising or signs of active bleeding.  He denies fevers, chills, night  sweats, shortness of breath, chest pain or cough.  He has no other complaints.   All other systems were reviewed with the patient and are negative.  MEDICAL HISTORY:  Past Medical History:  Diagnosis Date   Arthritis    BPH (benign prostatic hyperplasia)    Dyslipidemia    Elevated PSA    Esophageal cancer ([place])    Full dentures    GERD (gastroesophageal reflux disease)    History of melanoma excision    07/ 2011  s/p  wide excision left back--- per pathology-- negative maligant --  mild melanocytic atypia   History of palpitations    cardiologist --  dr Shelva Majestic (last note in epic 2014   Hypertension    Sleep apnea     SURGICAL HISTORY: Past Surgical History:  Procedure Laterality Date   COLONOSCOPY  10/24/2007   ESOPHAGOGASTRODUODENOSCOPY N/A 09/05/2021   Procedure: ESOPHAGOGASTRODUODENOSCOPY (EGD);  Surgeon: Lajuana Matte, MD;  Location: [facility] OR;  Service: Thoracic;  Laterality: N/A;   ESOPHAGOGASTRODUODENOSCOPY (EGD) WITH PROPOFOL N/A 05/11/2021   Procedure: ESOPHAGOGASTRODUODENOSCOPY (EGD) WITH PROPOFOL;  Surgeon: Arta Silence, MD;  Location: [facility] ENDOSCOPY;  Service: Endoscopy;  Laterality: N/A;   EUS N/A 05/11/2021   Procedure: ESOPHAGEAL ENDOSCOPIC ULTRASOUND (EUS) RADIAL;  Surgeon: Arta Silence, MD;  Location: [facility] ENDOSCOPY;  Service: Endoscopy;  Laterality: N/A;   EYE SURGERY     Lasik surgery on both eyes, but still needs glasses   FINE NEEDLE ASPIRATION  05/26/2021   Procedure: FINE NEEDLE ASPIRATION (FNA) LINEAR;  Surgeon: Garner Nash, DO;  Location: [place] [facility]COPY;  Service: Pulmonary;;   INTERCOSTAL NERVE BLOCK  09/05/2021   Procedure: INTERCOSTAL NERVE BLOCK;  Surgeon: Lajuana Matte, MD;  Location: [place];  Service: Thoracic;;   IR IMAGING GUIDED PORT INSERTION  06/30/2021   JEJUNOSTOMY  09/05/2021   Procedure: Shanon Rosser;  Surgeon: Lajuana Matte, MD;  Location: [place];  Service: Thoracic;;   PROSTATE BIOPSY N/A 07/02/2015    Procedure: BIOPSY TRANSRECTAL ULTRASONIC PROSTATE (TUBP);  Surgeon: Lowella Bandy, MD;  Location: [facility];  Service: Urology;  Laterality: N/A;   PROSTATE BIOPSY N/A 11/22/2015   Procedure: BIOPSY TRANSRECTAL ULTRASONIC PROSTATE (TUBP);  Surgeon: Cleon Gustin, MD;  Location: [facility];  Service: Urology;  Laterality: N/A;   TRANSTHORACIC ECHOCARDIOGRAM  11/19/2012   normal echo/  trivial TR   VIDEO BRONCHOSCOPY WITH ENDOBRONCHIAL ULTRASOUND N/A 05/26/2021   Procedure: VIDEO BRONCHOSCOPY WITH ENDOBRONCHIAL ULTRASOUND;  Surgeon: Garner Nash, DO;  Location: [place];  Service: Pulmonary;  Laterality: N/A;   WIDE EXCISION MELANOMA LEFT BACK  05/02/2010    I have reviewed the social history and family history with the patient and they are unchanged from previous note.  ALLERGIES:  is allergic to bee venom.  MEDICATIONS:  Current Outpatient Medications  Medication Sig Dispense Refill   aspirin EC 81 MG tablet Take 81 mg by mouth in the morning.     ketoconazole (NIZORAL) 2 % cream APPLY 1 APPLICATION TOPICALLY DAILY 15 g 0   lidocaine-prilocaine (EMLA) cream Apply 1 application topically as needed. (Patient not taking: Reported on 03/03/2022) 30 g 0   metoprolol succinate (TOPROL-XL) 50 MG 24 hr tablet Take 50 mg by mouth daily. Take with or immediately following a meal.     Multiple Vitamin (MULTIVITAMIN WITH MINERALS) TABS  tablet Take 1 tablet by mouth daily.     pantoprazole (PROTONIX) 40 MG tablet Take 1 tablet (40 mg total) by mouth 2 (two) times daily. 30 tablet 1   Skin Protectants, Misc. (EUCERIN) cream Apply 1 application. topically daily as needed for dry skin.     No current facility-administered medications for this visit.   Facility-Administered Medications Ordered in Other Visits  Medication Dose Route Frequency Provider Last Rate Last Admin   sodium chloride flush (NS) 0.9 % injection 10 mL  10 mL Intracatheter PRN [name], MD   10  mL at 04/24/22 1532    PHYSICAL EXAMINATION: ECOG PERFORMANCE STATUS: 1 - Symptomatic but completely ambulatory  Vitals:   04/24/22 1309  BP: (!) 148/79  Pulse: 74  Resp: 17  Temp: 99 F (37.2 C)  SpO2: 99%   Filed Weights   04/24/22 1309  Weight: 146 lb 14.4 oz (66.6 kg)    GENERAL:alert, no distress and comfortable SKIN: no rash  EYES:  sclera clear LUNGS: clear with normal breathing effort HEART: regular rate & rhythm, no lower extremity edema ABDOMEN: abdomen soft, non-tender and normal bowel sounds NEURO: alert & oriented x 3 with fluent speech, no focal motor/sensory deficits PAC without erythema   LABORATORY DATA:  I have reviewed the data as listed    Latest Ref Rng & Units 04/24/2022   12:54 PM 03/27/2022    9:16 AM 03/14/2022    2:18 PM  CBC  WBC 4.0 - 10.5 K/uL 6.5  2.8  4.9   Hemoglobin 13.0 - 17.0 g/dL 12.8  12.5  12.6   Hematocrit 39.0 - 52.0 % 36.4  36.1  36.2   Platelets 150 - 400 K/uL 168  126  207         Latest Ref Rng & Units 04/24/2022   12:54 PM 03/27/2022    9:16 AM 03/14/2022    2:18 PM  CMP  Glucose 70 - 99 mg/dL 130  93  137   BUN 8 - 23 mg/dL _0 Creatinine 0.61 - 1.24 mg/dL 0.86  0.74  0.86   Sodium 135 - 145 mmol/L 138  139  139   Potassium 3.5 - 5.1 mmol/L 3.9  3.8  3.8   Chloride 98 - 111 mmol/L 105  105  107   CO2 22 - 32 mmol/L _1 Calcium 8.9 - 10.3 mg/dL 9.1  9.4  8.8   Total Protein 6.5 - 8.1 g/dL 7.0  6.8  6.9   Total Bilirubin 0.3 - 1.2 mg/dL 0.6  0.7  0.6   Alkaline Phos 38 - 126 U/L 90  96  103   AST 15 - 41 U/L _2 ALT 0 - 44 U/L _3 RADIOGRAPHIC STUDIES: I have personally reviewed the radiological images as listed and agreed with the findings in the report. No results found.   ASSESSMENT & PLAN: Nathaniel Jennings is a 66 y.o. male with    1. Two esophageal Adenocarcinoma in mid and distal esophagus, distal cT3N2Mx with upper mediastinal adenopathy -He was initially referred  to [facility] GI for dysphasia with solid food and regurgitation. -EGD performed by Dr. Alessandra Bevels on 04/19/21 showed a partially obstructing, likely malignant, esophageal tumor in the distal esophagus and a polyp in the mid esophagus. Biopsy of both confirmed invasive moderately differentiated adenocarcinoma of  the esophagus, Her2 negative. -CT CAP on 05/05/21 showing: esophageal mass not well visualized; indeterminate mildly-enlarged high right paratracheal node near thoracic inlet; no other mediastinal adenopathy or evidence of metastatic disease in abdomen or pelvis; stable small pulmonary nodules bilaterally. -EUS 05/11/21 staged this as T3 N2 Mx. -PET scan 05/16/21 showed: 6.2 cm distal esophageal mass; hypermetabolic upper right paratracheal lymph node.  No other distant metastasis.  -EBUS biopsy on 05/26/21, path from 2R lymph node confirmed malignant cells consistent with his primary esophageal cancer. This node is not resectable by surgery  -He began concurrent chemoRT with weekly TC on 05/24/21. He completed radiation therapy on 07/04/21. -We changed his chemo to FOLFOX on 06/20/21, at reduced dose for first cycle due to mild neutropenia and concurrent RT. Tolerated well. He completed 4 doses -restaging PET 08/04/21 showed showed excellent response  -s/p esophagogastrectomy 09/05/21 by Dr. Kipp Brood; path showed invasive poorly differentiated adenocarcinoma, rare single tumor cells presents. Margins negative. Metastatic carcinoma involving 5 out of 14 (5/14) LNs -He began adjuvant nivolumab 10/10/21 q4 weeks    2. Severe dysphagia and odynophagia with weight loss -He lost about 15 lbs from April to July 2022. -secondary to #1 and esophagitis from RT -He required Hycet, lidocaine, and Carafate -resolved   3. Hypermetabolic thyroid nodule -seen on PET 05/16/21 (and not on head/neck US on 04/18/21) -repeat head/neck US on 06/03/21 showed a 1.1 cm inferior right nodule, corresponding to hypermetabolic  focus on PET.  -Biopsy scheduled 08/16/2021   4. HTN and BPH -continue medication, will monitor his blood pressure closely during treatment.     PLAN: -Labs from today were reviewed and adequate for treatment. Wbc 6.5, Hgb 12.8, Plt 168K. Creatinine and LFTs normal.  -Okay to proceed with Nivo today as scheduled.  -RTC on 05/22/2022 for port labs, f/u visit with Dr. Burr Medico prior to next [facility] treatment.    All questions were answered. The patient knows to call the clinic with any problems, questions or concerns. No barriers to learning was detected.   I have spent a total of 30 minutes minutes of face-to-face and non-face-to-face time, preparing to see the patient, performing a medically appropriate examination, counseling and educating the patient, documenting clinical information in the electronic health record, and care coordination.   Dede Query PA-C Dept of Hematology and [place] at [facility] Phone: [phone]

## 2022-04-25 LAB — T4: T4, Total: 7 ug/dL (ref 4.5–12.0)

## 2022-04-29 DIAGNOSIS — G4730 Sleep apnea, unspecified: Secondary | ICD-10-CM | POA: Diagnosis not present

## 2022-05-15 ENCOUNTER — Other Ambulatory Visit: Payer: Self-pay

## 2022-05-18 ENCOUNTER — Other Ambulatory Visit: Payer: Self-pay

## 2022-05-18 DIAGNOSIS — C158 Malignant neoplasm of overlapping sites of esophagus: Secondary | ICD-10-CM

## 2022-05-20 ENCOUNTER — Other Ambulatory Visit: Payer: Self-pay

## 2022-05-22 ENCOUNTER — Inpatient Hospital Stay (HOSPITAL_BASED_OUTPATIENT_CLINIC_OR_DEPARTMENT_OTHER): Payer: BC Managed Care – PPO | Admitting: Hematology

## 2022-05-22 ENCOUNTER — Inpatient Hospital Stay: Payer: BC Managed Care – PPO

## 2022-05-22 ENCOUNTER — Other Ambulatory Visit: Payer: Self-pay

## 2022-05-22 ENCOUNTER — Encounter: Payer: Self-pay | Admitting: Hematology

## 2022-05-22 VITALS — BP 146/86 | HR 61 | Temp 97.8°F | Resp 18 | Ht 66.0 in | Wt 148.4 lb

## 2022-05-22 DIAGNOSIS — C158 Malignant neoplasm of overlapping sites of esophagus: Secondary | ICD-10-CM

## 2022-05-22 DIAGNOSIS — Z79899 Other long term (current) drug therapy: Secondary | ICD-10-CM | POA: Diagnosis not present

## 2022-05-22 DIAGNOSIS — E860 Dehydration: Secondary | ICD-10-CM

## 2022-05-22 DIAGNOSIS — C778 Secondary and unspecified malignant neoplasm of lymph nodes of multiple regions: Secondary | ICD-10-CM | POA: Diagnosis not present

## 2022-05-22 DIAGNOSIS — Z95828 Presence of other vascular implants and grafts: Secondary | ICD-10-CM

## 2022-05-22 DIAGNOSIS — Z5112 Encounter for antineoplastic immunotherapy: Secondary | ICD-10-CM | POA: Diagnosis not present

## 2022-05-22 LAB — CMP (CANCER CENTER ONLY)
ALT: 12 U/L (ref 0–44)
AST: 16 U/L (ref 15–41)
Albumin: 4.2 g/dL (ref 3.5–5.0)
Alkaline Phosphatase: 74 U/L (ref 38–126)
Anion gap: 4 — ABNORMAL LOW (ref 5–15)
BUN: 19 mg/dL (ref 8–23)
CO2: 29 mmol/L (ref 22–32)
Calcium: 8.9 mg/dL (ref 8.9–10.3)
Chloride: 106 mmol/L (ref 98–111)
Creatinine: 0.8 mg/dL (ref 0.61–1.24)
GFR, Estimated: >60 mL/min (ref >60)
Glucose, Bld: 112 mg/dL — ABNORMAL HIGH (ref 70–99)
Potassium: 3.7 mmol/L (ref 3.5–5.1)
Sodium: 139 mmol/L (ref 135–145)
Total Bilirubin: 0.4 mg/dL (ref 0.3–1.2)
Total Protein: 7.1 g/dL (ref 6.5–8.1)

## 2022-05-22 LAB — CBC WITH DIFFERENTIAL (CANCER CENTER ONLY)
Abs Immature Granulocytes: 0.01 10*3/uL (ref 0.00–0.07)
Basophils Absolute: 0 10*3/uL (ref 0.0–0.1)
Basophils Relative: 1 %
Eosinophils Absolute: 0.1 10*3/uL (ref 0.0–0.5)
Eosinophils Relative: 2 %
HCT: 35.9 % — ABNORMAL LOW (ref 39.0–52.0)
Hemoglobin: 12.5 g/dL — ABNORMAL LOW (ref 13.0–17.0)
Immature Granulocytes: 0 %
Lymphocytes Relative: 18 %
Lymphs Abs: 0.6 10*3/uL — ABNORMAL LOW (ref 0.7–4.0)
MCH: 31.5 pg (ref 26.0–34.0)
MCHC: 34.8 g/dL (ref 30.0–36.0)
MCV: 90.4 fL (ref 80.0–100.0)
Monocytes Absolute: 0.4 10*3/uL (ref 0.1–1.0)
Monocytes Relative: 11 %
Neutro Abs: 2.4 10*3/uL (ref 1.7–7.7)
Neutrophils Relative %: 68 %
Platelet Count: 117 10*3/uL — ABNORMAL LOW (ref 150–400)
RBC: 3.97 MIL/uL — ABNORMAL LOW (ref 4.22–5.81)
RDW: 13.4 % (ref 11.5–15.5)
WBC Count: 3.6 10*3/uL — ABNORMAL LOW (ref 4.0–10.5)
nRBC: 0 % (ref 0.0–0.2)

## 2022-05-22 LAB — TSH: TSH: 1.296 u[IU]/mL (ref 0.350–4.500)

## 2022-05-22 MED ORDER — SODIUM CHLORIDE 0.9% FLUSH
10.0000 mL | INTRAVENOUS | Status: DC | PRN
  Administered 2022-05-22: 10 mL

## 2022-05-22 MED ORDER — HEPARIN SOD (PORK) LOCK FLUSH 100 UNIT/ML IV SOLN
500.0000 [IU] | Freq: Once | INTRAVENOUS | Status: AC | PRN
  Administered 2022-05-22: 500 [IU]

## 2022-05-22 MED ORDER — SODIUM CHLORIDE 0.9% FLUSH
10.0000 mL | Freq: Once | INTRAVENOUS | Status: AC
  Administered 2022-05-22: 10 mL

## 2022-05-22 MED ORDER — SODIUM CHLORIDE 0.9 % IV SOLN
480.0000 mg | Freq: Once | INTRAVENOUS | Status: AC
  Administered 2022-05-22: 480 mg via INTRAVENOUS
  Filled 2022-05-22: qty 48

## 2022-05-22 MED ORDER — SODIUM CHLORIDE 0.9 % IV SOLN: Freq: Once | INTRAVENOUS | Status: AC

## 2022-05-22 NOTE — Progress Notes (Signed)
Nathaniel Jennings   [phone] [phone]   Clinic Follow up Note   Patient Care Team: Cyndi Bender, Hershal Coria as PCP - General (Physician Assistant) [name], MD as Consulting Physician (Oncology)  Date of Service:  05/22/2022  CHIEF COMPLAINT: f/u of esophageal cancer  CURRENT THERAPY:  Adjuvant nivolumab, starting 10/10/21, currently q4weeks  ASSESSMENT & PLAN:  Nathaniel Jennings is a 66 y.o. male with   1. 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 EGD performed by Dr. Alessandra Bevels on 04/19/21 showed a partially obstructing tumor in distal esophagus and a polyp in mid esophagus. Biopsy of both confirmed invasive moderately differentiated adenocarcinoma of the esophagus, Her2 negative. -PET scan 05/16/21 showed: 6.2 cm distal esophageal mass; hypermetabolic upper right paratracheal lymph node. No other distant metastasis.  -EBUS biopsy on 05/26/21, path from 2R lymph node confirmed malignant cells consistent with his primary esophageal cancer. This node is not resectable by surgery  -He received concurrent chemoRT 05/24/21 - 07/04/21. He started with chemo TC then switched to FOLFOX on 06/20/21. He completed 4 cycles on 08/08/21. Posttreatment PET scan showed excellent response. -s/p esophagogastrectomy on 09/05/21 under Dr. Kipp Brood. Path showed 3 cm residual adenocarcinoma, margins are negative, 5/14 positive lymph nodes. -he began adjuvant nivolumab on 10/10/21. He is tolerating well overall. -restaging CT CAP on 12/30/21 showed NED.  Plan to repeat surveillance CT scan in Sep  -he is now receiving nivolumab every 4 weeks and tolerating well, except mild acid reflux. Labs reviewed, overall stable. Will continue.   2. HTN, BPH, Eczema  -continue medication, will monitor his blood pressure closely during treatment.     PLAN: -proceed  with nivolumab today -lab, flush, and nivolumab every 4 weeks -f/u in 8 weeks with restaging CT CAP w contrast    No problem-specific Assessment & Plan notes found for this encounter.   SUMMARY OF ONCOLOGIC HISTORY: Oncology History Overview Note   Cancer Staging  Malignant neoplasm of overlapping sites of esophagus [facility]) Staging form: Esophagus - Adenocarcinoma, AJCC 8th Edition - Clinical stage from 05/13/2021: Stage IVA (cT3, cN2, cM0) - Signed by [name], MD on 05/22/2021    Malignant neoplasm of overlapping sites of esophagus ([place])  04/18/2021 Imaging   ULTRASOUND OF HEAD/NECK SOFT TISSUES  IMPRESSION: No suspicious lymphadenopathy   04/19/2021 Procedure   EGD  Impression: - Esophageal polyp(s) were found. Biopsied. - Partially obstructing, likely malignant esophageal tumor was found in the distal esophagus. - Small hiatal hernia. - A single gastric polyp. Biopsied. - Normal duodenal bulb, first portion of the duodenum and second portion of the duodenum.   04/19/2021 Pathology Results   FINAL MICROSCOPIC DIAGNOSIS: Stomach, Biopsy - FUNDIC GLAND POLYP. Negative for dysplasia. NO Helicobacter pylori organisms seen on H and E stain. Esophagus- Distal, Biopsy - INVASIVE MODERATELY DIFFERENTIATED ADENOCARCINOMA OF THE ESOPHAGUS.  - HER2 Study by Immunostain: Negative (score 1+) Esophagus- Mid, Biopsy - INVASIVE MODERATELY DIFFERENTIATED ADENOCARCINOMA OF THE ESOPHAGUS. - HER2 Study by Immunostain: Negative (score 1+)   05/05/2021 Imaging   CT CAP  IMPRESSION: 1. The patient's known esophageal mass is not well visualized. There is mild irregular wall thickening of the distal esophagus. Correlate with previous clinical evaluation. 2. Indeterminate mildly enlarged high right paratracheal node near the thoracic inlet. No other mediastinal adenopathy. 3. No evidence of metastatic disease in the abdomen or pelvis. 4. Stable small pulmonary nodules bilaterally  consistent  with benign findings. 5. Cholelithiasis, small renal cysts and Aortic Atherosclerosis (ICD10-I70.0).   05/09/2021 Initial Diagnosis   Malignant neoplasm of overlapping sites of esophagus ([place])   05/11/2021 Procedure   Upper EUS  Impression: - Mucosal nodule found in the esophagus. - Partially obstructing, malignant esophageal tumor was found at the gastroesophageal junction. - At least 3 discrete peritumoral lymph nodes - A mass was found in the gastroesophageal junction. A tissue diagnosis was obtained prior to this exam. This is of adenocarcinoma. This was staged T3 N2 Mx by endosonographic criteria. - Unable to traverse GE junction with either EGD scope or radial EUS scope.   05/13/2021 Cancer Staging   Staging form: Esophagus - Adenocarcinoma, AJCC 8th Edition - Clinical stage from 05/13/2021: Stage IVA (cT3, cN2, cM0) - Signed by [name], MD on 05/22/2021   05/16/2021 PET scan   IMPRESSION: 1. Distal esophageal mass measuring about 6.2 cm in length, maximum SUV 13.1. 2. Mildly hypermetabolic and mildly enlarged upper right paratracheal lymph node, 1.3 cm in short axis with maximum SUV 3.6, concerning for malignant involvement. 3. Hypodense 1.1 cm right thyroid nodule has a maximum SUV substantially above that of the background thyroid. A significant minority of such nodules can harbor thyroid cancer. Recommend thyroid US and biopsy (ref: J Am Coll Radiol. 2015 Feb;12(2): 143-50). 4. 3 by 4 mm right lower lobe pulmonary nodule is similar to 05/05/2021, not hypermetabolic but below sensitive PET-CT size thresholds. Surveillance recommended. 5. Other imaging findings of potential clinical significance: Chronic left maxillary sinusitis. Aortic Atherosclerosis (ICD10-I70.0). Cholelithiasis. Prostatomegaly.   05/24/2021 - 06/13/2021 Chemotherapy         05/26/2021 Pathology Results   FINAL MICROSCOPIC DIAGNOSIS:   A. LYMPH NODE, 2R, FINE NEEDLE ASPIRATION:  - Malignant cells  consistent with non-small cell carcinoma   COMMENT:  The cells are positive for cytokeratin 20 and CDX-2 (patchy). TTF-1, NapsinA, cytokeratin 5/6, p40, S100, MelanA, and cytokeratin 7 are negative. The patient's history of esophageal adenocarcinoma is noted and CDX-2 positivity is consistent with a gastrointestinal primary. This cytokeratin7/20 profile is usually seen in lower tract tumors, but this pattern can be seen in gastric/esophageal cases.   05/27/2021 - 07/04/2021 Radiation Therapy   Per Dr. Isidore Moos   06/20/2021 - 08/08/2021 Chemotherapy   Patient is on Treatment Plan : GASTROESOPHAGEAL FOLFOX q14d x 6 cycles     08/04/2021 PET scan   IMPRESSION: 1. Interval development of a new 6 mm short axis right cervical node with hypermetabolism. Finding is somewhat indeterminate given the apparent response of the hypermetabolic right paratracheal lymphadenopathy seen on the previous study. Close follow-up recommended as metastatic disease not excluded. 2. Diffuse hypermetabolic FDG accumulation noted in the esophagus today with more diffuse circumferential esophageal wall thickening on today's study than on the previous exam. Hypermetabolism in the distal esophagus at the level of the hypermetabolic neoplasm previously has decreased from SUV max = 13 to SUV max = 7 today. 3. Stable tiny 3-4 mm right lower lobe and left upper lobe pulmonary nodules. Continued attention on follow-up recommended. 4. Cholelithiasis. 5. Emphysema.  (ICD10-J43.9) 6.  Aortic Atherosclerois (ICD10-170.0)   08/17/2021 Pathology Results   FINAL MICROSCOPIC DIAGNOSIS:  A. LYMPH NODE, RIGHT CERVICAL, FINE NEEDLE ASPIRATION:  - No malignant cells identified  - See comment   COMMENT:  There is scant cellularity and the specimen may not be representative.    09/05/2021 Definitive Surgery   FINAL MICROSCOPIC DIAGNOSIS:   A. ESOPHAGOGASTRECTOMY:  -  Invasive poorly differentiated adenocarcinoma.  - Metastatic carcinoma  involving 5 of 13 lymph nodes (5/13).  - See oncology table below.   B. LYMPH NODE, LEVEL 8, EXCISION:  - One lymph node, negative for malignancy (0/1).   09/05/2021 Cancer Staging   Staging form: Esophagus - Adenocarcinoma, AJCC 8th Edition - Pathologic stage from 09/05/2021: Stage IIIB (ypT3, pN2, cM0, G3) - Signed by [name], MD on 12/03/2021 Stage prefix: Post-therapy Response to neoadjuvant therapy: Partial response Histologic grading system: 3 grade system Residual tumor (R): R0 - None   10/10/2021 -  Chemotherapy   Patient is on Treatment Plan : GASTROESOPHAGEAL Nivolumab q14d x 8 cycles / Nivolumab q28d     12/30/2021 Imaging   EXAM: CT CHEST, ABDOMEN, AND PELVIS WITH CONTRAST  IMPRESSION: Status post esophagectomy with gastric pull-through procedure. No evidence of recurrent or metastatic carcinoma within the chest, abdomen, or pelvis.   Cholelithiasis. No radiographic evidence of cholecystitis.   Stable enlarged prostate.   Aortic Atherosclerosis (ICD10-I70.0).      INTERVAL HISTORY:  ADYEN BIFULCO is here for a follow up of esophageal cancer. He was last seen by PA Murray Hodgkins on 04/24/22. He presents to the clinic accompanied by his wife. He reports he is doing well, denies any new complaints.   All other systems were reviewed with the patient and are negative.  MEDICAL HISTORY:  Past Medical History:  Diagnosis Date   Arthritis    BPH (benign prostatic hyperplasia)    Dyslipidemia    Elevated PSA    Esophageal cancer ([place])    Full dentures    GERD (gastroesophageal reflux disease)    History of melanoma excision    07/ 2011  s/p  wide excision left back--- per pathology-- negative maligant --  mild melanocytic atypia   History of palpitations    cardiologist --  dr Shelva Majestic (last note in epic 2014   Hypertension    Sleep apnea     SURGICAL HISTORY: Past Surgical History:  Procedure Laterality Date   COLONOSCOPY  10/24/2007    ESOPHAGOGASTRODUODENOSCOPY N/A 09/05/2021   Procedure: ESOPHAGOGASTRODUODENOSCOPY (EGD);  Surgeon: Lajuana Matte, MD;  Location: [facility] OR;  Service: Thoracic;  Laterality: N/A;   ESOPHAGOGASTRODUODENOSCOPY (EGD) WITH PROPOFOL N/A 05/11/2021   Procedure: ESOPHAGOGASTRODUODENOSCOPY (EGD) WITH PROPOFOL;  Surgeon: Arta Silence, MD;  Location: [facility] ENDOSCOPY;  Service: Endoscopy;  Laterality: N/A;   EUS N/A 05/11/2021   Procedure: ESOPHAGEAL ENDOSCOPIC ULTRASOUND (EUS) RADIAL;  Surgeon: Arta Silence, MD;  Location: [facility] ENDOSCOPY;  Service: Endoscopy;  Laterality: N/A;   EYE SURGERY     Lasik surgery on both eyes, but still needs glasses   FINE NEEDLE ASPIRATION  05/26/2021   Procedure: FINE NEEDLE ASPIRATION (FNA) LINEAR;  Surgeon: Garner Nash, DO;  Location: [facility];  Service: Pulmonary;;   INTERCOSTAL NERVE BLOCK  09/05/2021   Procedure: INTERCOSTAL NERVE BLOCK;  Surgeon: Lajuana Matte, MD;  Location: [facility];  Service: Thoracic;;   IR IMAGING GUIDED PORT INSERTION  06/30/2021   JEJUNOSTOMY  09/05/2021   Procedure: Shanon Rosser;  Surgeon: Lajuana Matte, MD;  Location: [facility];  Service: Thoracic;;   PROSTATE BIOPSY N/A 07/02/2015   Procedure: BIOPSY TRANSRECTAL ULTRASONIC PROSTATE (TUBP);  Surgeon: Lowella Bandy, MD;  Location: [facility];  Service: Urology;  Laterality: N/A;   PROSTATE BIOPSY N/A 11/22/2015   Procedure: BIOPSY TRANSRECTAL ULTRASONIC PROSTATE (TUBP);  Surgeon: Cleon Gustin, MD;  Location: [facility];  Service:  Urology;  Laterality: N/A;   TRANSTHORACIC ECHOCARDIOGRAM  11/19/2012   normal echo/  trivial TR   VIDEO BRONCHOSCOPY WITH ENDOBRONCHIAL ULTRASOUND N/A 05/26/2021   Procedure: VIDEO BRONCHOSCOPY WITH ENDOBRONCHIAL ULTRASOUND;  Surgeon: Garner Nash, DO;  Location: [place];  Service: Pulmonary;  Laterality: N/A;   WIDE EXCISION MELANOMA LEFT BACK  05/02/2010    I have reviewed the social history and family  history with the patient and they are unchanged from previous note.  ALLERGIES:  is allergic to bee venom.  MEDICATIONS:  Current Outpatient Medications  Medication Sig Dispense Refill   aspirin EC 81 MG tablet Take 81 mg by mouth in the morning.     ketoconazole (NIZORAL) 2 % cream APPLY 1 APPLICATION TOPICALLY DAILY 15 g 0   metoprolol succinate (TOPROL-XL) 50 MG 24 hr tablet Take 50 mg by mouth daily. Take with or immediately following a meal.     Multiple Vitamin (MULTIVITAMIN WITH MINERALS) TABS tablet Take 1 tablet by mouth daily.     pantoprazole (PROTONIX) 40 MG tablet Take 1 tablet (40 mg total) by mouth 2 (two) times daily. 30 tablet 1   Skin Protectants, Misc. (EUCERIN) cream Apply 1 application. topically daily as needed for dry skin.     No current facility-administered medications for this visit.    PHYSICAL EXAMINATION: ECOG PERFORMANCE STATUS: 0 - Asymptomatic  Vitals:   05/22/22 1021  BP: (!) 146/86  Pulse: 61  Resp: 18  Temp: 97.8 F (36.6 C)  SpO2: 100%   Wt Readings from Last 3 Encounters:  05/22/22 148 lb 6.4 oz (67.3 kg)  04/24/22 146 lb 14.4 oz (66.6 kg)  03/27/22 143 lb 14.4 oz (65.3 kg)     GENERAL:alert, no distress and comfortable SKIN: skin color normal, no rashes or significant lesions EYES: normal, Conjunctiva are pink and non-injected, sclera clear  NEURO: alert & oriented x 3 with fluent speech  LABORATORY DATA:  I have reviewed the data as listed    Latest Ref Rng & Units 05/22/2022    9:50 AM 04/24/2022   12:54 PM 03/27/2022    9:16 AM  CBC  WBC 4.0 - 10.5 K/uL 3.6  6.5  2.8   Hemoglobin 13.0 - 17.0 g/dL 12.5  12.8  12.5   Hematocrit 39.0 - 52.0 % 35.9  36.4  36.1   Platelets 150 - 400 K/uL 117  168  126         Latest Ref Rng & Units 05/22/2022    9:50 AM 04/24/2022   12:54 PM 03/27/2022    9:16 AM  CMP  Glucose 70 - 99 mg/dL 112  130  93   BUN 8 - 23 mg/dL _0 Creatinine 0.61 - 1.24 mg/dL 0.80  0.86  0.74   Sodium 135  - 145 mmol/L 139  138  139   Potassium 3.5 - 5.1 mmol/L 3.7  3.9  3.8   Chloride 98 - 111 mmol/L 106  105  105   CO2 22 - 32 mmol/L _1 Calcium 8.9 - 10.3 mg/dL 8.9  9.1  9.4   Total Protein 6.5 - 8.1 g/dL 7.1  7.0  6.8   Total Bilirubin 0.3 - 1.2 mg/dL 0.4  0.6  0.7   Alkaline Phos 38 - 126 U/L 74  90  96   AST 15 - 41 U/L _2 ALT 0 - 44  U/L _0 RADIOGRAPHIC STUDIES: I have personally reviewed the radiological images as listed and agreed with the findings in the report. No results found.    Orders Placed This Encounter  Procedures   CT CHEST ABDOMEN PELVIS W CONTRAST    Standing Status:   Future    Standing Expiration Date:   05/23/2023    Order Specific Question:   Preferred imaging location?    Answer:   [facility]    Order Specific Question:   Is Oral Contrast requested for this exam?    Answer:   Yes, Per Radiology protocol   All questions were answered. The patient knows to call the clinic with any problems, questions or concerns. No barriers to learning was detected. The total time spent in the appointment was 30 minutes.     [name], MD 05/22/2022   I, Wilburn Mylar, am acting as scribe for [name], MD.   I have reviewed the above documentation for accuracy and completeness, and I agree with the above.

## 2022-05-22 NOTE — Patient Instructions (Signed)
[facility] MEDICAL ONCOLOGY   Discharge Instructions: Thank you for choosing [facility] to provide your oncology and hematology care.   If you have a lab appointment with the Cancer Center, please go directly to the Cancer Center and check in at the registration area.   Wear comfortable clothing and clothing appropriate for easy access to any Portacath or PICC line.   We strive to give you quality time with your provider. You may need to reschedule your appointment if you arrive late (15 or more minutes).  Arriving late affects you and other patients whose appointments are after yours.  Also, if you miss three or more appointments without notifying the office, you may be dismissed from the clinic at the provider's discretion.      For prescription refill requests, have your pharmacy contact our office and allow 72 hours for refills to be completed.    Today you received the following chemotherapy and/or immunotherapy agents: nivolumab      To help prevent nausea and vomiting after your treatment, we encourage you to take your nausea medication as directed.  BELOW ARE SYMPTOMS THAT SHOULD BE REPORTED IMMEDIATELY: *FEVER GREATER THAN 100.4 F (38 C) OR HIGHER *CHILLS OR SWEATING *NAUSEA AND VOMITING THAT IS NOT CONTROLLED WITH YOUR NAUSEA MEDICATION *UNUSUAL SHORTNESS OF BREATH *UNUSUAL BRUISING OR BLEEDING *URINARY PROBLEMS (pain or burning when urinating, or frequent urination) *BOWEL PROBLEMS (unusual diarrhea, constipation, pain near the anus) TENDERNESS IN MOUTH AND THROAT WITH OR WITHOUT PRESENCE OF ULCERS (sore throat, sores in mouth, or a toothache) UNUSUAL RASH, SWELLING OR PAIN  UNUSUAL VAGINAL DISCHARGE OR ITCHING   Items with * indicate a potential emergency and should be followed up as soon as possible or go to the Emergency Department if any problems should occur.  Please show the CHEMOTHERAPY ALERT CARD or IMMUNOTHERAPY ALERT CARD at check-in  to the Emergency Department and triage nurse.  Should you have questions after your visit or need to cancel or reschedule your appointment, please contact [facility]ICAL ONCOLOG[facility]  Dept: [phone]  and follow the prompts.  Office hours are 8:00 a.m. to 4:30 p.m. Monday - Friday. Please note that voicemails left after 4:00 p.m. may not be returned until the following business day.  We are closed weekends and major holidays. You have access to a nurse at all times for urgent questions. Please call the main number to the clinic Dept: [phone] and follow the prompts.   For any non-urgent questions, you may also contact your provider using MyChart. We now offer e-Visits for anyone 18 and older to request care online for non-urgent symptoms. For details visit [email or web address].   Also download the MyChart app! Go to the app store, search "MyChart", open the app, select , and log in with your MyChart username and password.  Masks are optional in the cancer centers. If you would like for your care team to wear a mask while they are taking care of you, please let them know. For doctor visits, patients may have with them one support person who is at least 66 years old. At this time, visitors are not allowed in the infusion area. 

## 2022-05-23 LAB — T4: T4, Total: 8.1 ug/dL (ref 4.5–12.0)

## 2022-06-14 DIAGNOSIS — C159 Malignant neoplasm of esophagus, unspecified: Secondary | ICD-10-CM | POA: Diagnosis not present

## 2022-06-14 DIAGNOSIS — I10 Essential (primary) hypertension: Secondary | ICD-10-CM | POA: Diagnosis not present

## 2022-06-14 DIAGNOSIS — G4733 Obstructive sleep apnea (adult) (pediatric): Secondary | ICD-10-CM | POA: Diagnosis not present

## 2022-06-14 DIAGNOSIS — K219 Gastro-esophageal reflux disease without esophagitis: Secondary | ICD-10-CM | POA: Diagnosis not present

## 2022-06-14 DIAGNOSIS — Z1331 Encounter for screening for depression: Secondary | ICD-10-CM | POA: Diagnosis not present

## 2022-06-16 ENCOUNTER — Other Ambulatory Visit: Payer: Self-pay

## 2022-06-19 ENCOUNTER — Inpatient Hospital Stay: Payer: BC Managed Care – PPO

## 2022-06-19 ENCOUNTER — Inpatient Hospital Stay: Payer: BC Managed Care – PPO | Attending: Hematology

## 2022-06-19 ENCOUNTER — Ambulatory Visit: Payer: BC Managed Care – PPO | Admitting: Hematology

## 2022-06-19 ENCOUNTER — Other Ambulatory Visit: Payer: Self-pay

## 2022-06-19 VITALS — Wt 151.2 lb

## 2022-06-19 VITALS — BP 149/98 | HR 64 | Temp 98.2°F | Resp 18

## 2022-06-19 DIAGNOSIS — Z95828 Presence of other vascular implants and grafts: Secondary | ICD-10-CM

## 2022-06-19 DIAGNOSIS — Z5112 Encounter for antineoplastic immunotherapy: Secondary | ICD-10-CM | POA: Insufficient documentation

## 2022-06-19 DIAGNOSIS — C158 Malignant neoplasm of overlapping sites of esophagus: Secondary | ICD-10-CM

## 2022-06-19 DIAGNOSIS — E860 Dehydration: Secondary | ICD-10-CM

## 2022-06-19 DIAGNOSIS — Z79899 Other long term (current) drug therapy: Secondary | ICD-10-CM | POA: Diagnosis not present

## 2022-06-19 LAB — CMP (CANCER CENTER ONLY)
ALT: 13 U/L (ref 0–44)
AST: 13 U/L — ABNORMAL LOW (ref 15–41)
Albumin: 4 g/dL (ref 3.5–5.0)
Alkaline Phosphatase: 85 U/L (ref 38–126)
Anion gap: 6 (ref 5–15)
BUN: 16 mg/dL (ref 8–23)
CO2: 28 mmol/L (ref 22–32)
Calcium: 9.3 mg/dL (ref 8.9–10.3)
Chloride: 107 mmol/L (ref 98–111)
Creatinine: 0.6 mg/dL — ABNORMAL LOW (ref 0.61–1.24)
GFR, Estimated: >60 mL/min (ref >60)
Glucose, Bld: 98 mg/dL (ref 70–99)
Potassium: 3.8 mmol/L (ref 3.5–5.1)
Sodium: 141 mmol/L (ref 135–145)
Total Bilirubin: 0.3 mg/dL (ref 0.3–1.2)
Total Protein: 7 g/dL (ref 6.5–8.1)

## 2022-06-19 LAB — TSH: TSH: 1.994 u[IU]/mL (ref 0.350–4.500)

## 2022-06-19 LAB — CBC WITH DIFFERENTIAL (CANCER CENTER ONLY)
Abs Immature Granulocytes: 0.04 10*3/uL (ref 0.00–0.07)
Basophils Absolute: 0.1 10*3/uL (ref 0.0–0.1)
Basophils Relative: 1 %
Eosinophils Absolute: 0.1 10*3/uL (ref 0.0–0.5)
Eosinophils Relative: 3 %
HCT: 33.6 % — ABNORMAL LOW (ref 39.0–52.0)
Hemoglobin: 11.8 g/dL — ABNORMAL LOW (ref 13.0–17.0)
Immature Granulocytes: 1 %
Lymphocytes Relative: 15 %
Lymphs Abs: 0.7 10*3/uL (ref 0.7–4.0)
MCH: 31.2 pg (ref 26.0–34.0)
MCHC: 35.1 g/dL (ref 30.0–36.0)
MCV: 88.9 fL (ref 80.0–100.0)
Monocytes Absolute: 0.4 10*3/uL (ref 0.1–1.0)
Monocytes Relative: 9 %
Neutro Abs: 3.5 10*3/uL (ref 1.7–7.7)
Neutrophils Relative %: 71 %
Platelet Count: 164 10*3/uL (ref 150–400)
RBC: 3.78 MIL/uL — ABNORMAL LOW (ref 4.22–5.81)
RDW: 13.1 % (ref 11.5–15.5)
WBC Count: 4.8 10*3/uL (ref 4.0–10.5)
nRBC: 0 % (ref 0.0–0.2)

## 2022-06-19 MED ORDER — SODIUM CHLORIDE 0.9% FLUSH
10.0000 mL | INTRAVENOUS | Status: DC | PRN
  Administered 2022-06-19: 10 mL

## 2022-06-19 MED ORDER — SODIUM CHLORIDE 0.9 % IV SOLN: Freq: Once | INTRAVENOUS | Status: AC

## 2022-06-19 MED ORDER — SODIUM CHLORIDE 0.9% FLUSH: 10.0000 mL | Freq: Once | INTRAVENOUS | Status: DC

## 2022-06-19 MED ORDER — HEPARIN SOD (PORK) LOCK FLUSH 100 UNIT/ML IV SOLN
500.0000 [IU] | Freq: Once | INTRAVENOUS | Status: AC | PRN
  Administered 2022-06-19: 500 [IU]

## 2022-06-19 MED ORDER — SODIUM CHLORIDE 0.9 % IV SOLN
480.0000 mg | Freq: Once | INTRAVENOUS | Status: AC
  Administered 2022-06-19: 480 mg via INTRAVENOUS
  Filled 2022-06-19: qty 48

## 2022-06-19 NOTE — Patient Instructions (Signed)
[facility]ICAL [facility]COLOGY   Discharge Instructions: Thank you for choosing [facility] to provide your oncology and hematology care.   If you have a lab appointment with the Cancer Center, please go directly to the Cancer Center and check in at the registration area.   Wear comfortable clothing and clothing appropriate for easy access to any Portacath or PICC line.   We strive to give you quality time with your provider. You may need to reschedule your appointment if you arrive late (15 or more minutes).  Arriving late affects you and other patients whose appointments are after yours.  Also, if you miss three or more appointments without notifying the office, you may be dismissed from the clinic at the provider's discretion.      For prescription refill requests, have your pharmacy contact our office and allow 72 hours for refills to be completed.    Today you received the following chemotherapy and/or immunotherapy agents: nivolumab      To help prevent nausea and vomiting after your treatment, we encourage you to take your nausea medication as directed.  BELOW ARE SYMPTOMS THAT SHOULD BE REPORTED IMMEDIATELY: *FEVER GREATER THAN 100.4 F (38 C) OR HIGHER *CHILLS OR SWEATING *NAUSEA AND VOMITING THAT IS NOT CONTROLLED WITH YOUR NAUSEA MEDICATION *UNUSUAL SHORTNESS OF BREATH *UNUSUAL BRUISING OR BLEEDING *URINARY PROBLEMS (pain or burning when urinating, or frequent urination) *BOWEL PROBLEMS (unusual diarrhea, constipation, pain near the anus) TENDERNESS IN MOUTH AND THROAT WITH OR WITHOUT PRESENCE OF ULCERS (sore throat, sores in mouth, or a toothache) UNUSUAL RASH, SWELLING OR PAIN  UNUSUAL VAGINAL DISCHARGE OR ITCHING   Items with * indicate a potential emergency and should be followed up as soon as possible or go to the Emergency Department if any problems should occur.  Please show the CHEMOTHERAPY ALERT CARD or IMMUNOTHERAPY ALERT CARD at check-in  to the Emergency Department and triage nurse.  Should you have questions after your visit or need to cancel or reschedule your appointment, please contact [facility] ONCOL[facility]  Dept: [phone]  and follow the prompts.  Office hours are 8:00 a.m. to 4:30 p.m. Monday - Friday. Please note that voicemails left after 4:00 p.m. may not be returned until the following business day.  We are closed weekends and major holidays. You have access to a nurse at all times for urgent questions. Please call the main number to the clinic Dept: [phone] and follow the prompts.   For any non-urgent questions, you may also contact your provider using MyChart. We now offer e-Visits for anyone 18 and older to request care online for non-urgent symptoms. For details visit [email or web address].   Also download the MyChart app! Go to the app store, search "MyChart", open the app, select [facility], and log in with your MyChart username and password.  Masks are optional in the cancer centers. If you would like for your care team to wear a mask while they are taking care of you, please let them know. For doctor visits, patients may have with them one support Itzayanna Kaster who is at least 66 years old. At this time, visitors are not allowed in the infusion area. 

## 2022-06-20 LAB — T4: T4, Total: 7 ug/dL (ref 4.5–12.0)

## 2022-07-02 ENCOUNTER — Other Ambulatory Visit: Payer: Self-pay | Admitting: Hematology

## 2022-07-02 DIAGNOSIS — C158 Malignant neoplasm of overlapping sites of esophagus: Secondary | ICD-10-CM

## 2022-07-13 ENCOUNTER — Ambulatory Visit (HOSPITAL_COMMUNITY)
Admission: RE | Admit: 2022-07-13 | Discharge: 2022-07-13 | Disposition: A | Discharge status: Home / Self Care | Payer: BC Managed Care – PPO | Source: Ambulatory Visit | Attending: Hematology | Admitting: Hematology

## 2022-07-13 DIAGNOSIS — I700 Atherosclerosis of aorta: Secondary | ICD-10-CM | POA: Diagnosis not present

## 2022-07-13 DIAGNOSIS — C158 Malignant neoplasm of overlapping sites of esophagus: Secondary | ICD-10-CM | POA: Diagnosis not present

## 2022-07-13 DIAGNOSIS — K8689 Other specified diseases of pancreas: Secondary | ICD-10-CM | POA: Diagnosis not present

## 2022-07-13 DIAGNOSIS — K8020 Calculus of gallbladder without cholecystitis without obstruction: Secondary | ICD-10-CM | POA: Diagnosis not present

## 2022-07-13 DIAGNOSIS — R599 Enlarged lymph nodes, unspecified: Secondary | ICD-10-CM | POA: Diagnosis not present

## 2022-07-13 DIAGNOSIS — J984 Other disorders of lung: Secondary | ICD-10-CM | POA: Diagnosis not present

## 2022-07-13 DIAGNOSIS — N281 Cyst of kidney, acquired: Secondary | ICD-10-CM | POA: Diagnosis not present

## 2022-07-13 DIAGNOSIS — C159 Malignant neoplasm of esophagus, unspecified: Secondary | ICD-10-CM | POA: Diagnosis not present

## 2022-07-13 MED ORDER — SODIUM CHLORIDE (PF) 0.9 % IJ SOLN
INTRAMUSCULAR | Status: AC
  Filled 2022-07-13: qty 50

## 2022-07-13 MED ORDER — HEPARIN SOD (PORK) LOCK FLUSH 100 UNIT/ML IV SOLN
INTRAVENOUS | Status: AC
  Filled 2022-07-13: qty 5

## 2022-07-13 MED ORDER — IOHEXOL 300 MG/ML  SOLN
100.0000 mL | Freq: Once | INTRAMUSCULAR | Status: AC | PRN
  Administered 2022-07-13: 100 mL via INTRAVENOUS

## 2022-07-14 ENCOUNTER — Other Ambulatory Visit: Payer: Self-pay

## 2022-07-17 ENCOUNTER — Inpatient Hospital Stay: Payer: BC Managed Care – PPO

## 2022-07-17 ENCOUNTER — Other Ambulatory Visit: Payer: Self-pay

## 2022-07-17 ENCOUNTER — Encounter: Payer: Self-pay | Admitting: Hematology

## 2022-07-17 ENCOUNTER — Inpatient Hospital Stay: Payer: BC Managed Care – PPO | Admitting: Hematology

## 2022-07-17 ENCOUNTER — Inpatient Hospital Stay: Payer: BC Managed Care – PPO | Attending: Hematology

## 2022-07-17 VITALS — BP 140/91 | HR 59 | Resp 18

## 2022-07-17 VITALS — BP 163/91 | HR 66 | Temp 97.8°F | Resp 18 | Ht 66.0 in | Wt 152.5 lb

## 2022-07-17 DIAGNOSIS — Z5112 Encounter for antineoplastic immunotherapy: Secondary | ICD-10-CM | POA: Insufficient documentation

## 2022-07-17 DIAGNOSIS — E860 Dehydration: Secondary | ICD-10-CM

## 2022-07-17 DIAGNOSIS — C158 Malignant neoplasm of overlapping sites of esophagus: Secondary | ICD-10-CM | POA: Diagnosis not present

## 2022-07-17 DIAGNOSIS — I10 Essential (primary) hypertension: Secondary | ICD-10-CM | POA: Insufficient documentation

## 2022-07-17 DIAGNOSIS — C779 Secondary and unspecified malignant neoplasm of lymph node, unspecified: Secondary | ICD-10-CM | POA: Insufficient documentation

## 2022-07-17 DIAGNOSIS — Z79899 Other long term (current) drug therapy: Secondary | ICD-10-CM | POA: Insufficient documentation

## 2022-07-17 DIAGNOSIS — Z95828 Presence of other vascular implants and grafts: Secondary | ICD-10-CM

## 2022-07-17 LAB — CBC WITH DIFFERENTIAL (CANCER CENTER ONLY)
Abs Immature Granulocytes: 0.02 10*3/uL (ref 0.00–0.07)
Basophils Absolute: 0.1 10*3/uL (ref 0.0–0.1)
Basophils Relative: 1 %
Eosinophils Absolute: 0.2 10*3/uL (ref 0.0–0.5)
Eosinophils Relative: 4 %
HCT: 37.2 % — ABNORMAL LOW (ref 39.0–52.0)
Hemoglobin: 13 g/dL (ref 13.0–17.0)
Immature Granulocytes: 0 %
Lymphocytes Relative: 15 %
Lymphs Abs: 0.8 10*3/uL (ref 0.7–4.0)
MCH: 31.3 pg (ref 26.0–34.0)
MCHC: 34.9 g/dL (ref 30.0–36.0)
MCV: 89.4 fL (ref 80.0–100.0)
Monocytes Absolute: 0.6 10*3/uL (ref 0.1–1.0)
Monocytes Relative: 11 %
Neutro Abs: 3.5 10*3/uL (ref 1.7–7.7)
Neutrophils Relative %: 69 %
Platelet Count: 143 10*3/uL — ABNORMAL LOW (ref 150–400)
RBC: 4.16 MIL/uL — ABNORMAL LOW (ref 4.22–5.81)
RDW: 13.7 % (ref 11.5–15.5)
WBC Count: 5.2 10*3/uL (ref 4.0–10.5)
nRBC: 0 % (ref 0.0–0.2)

## 2022-07-17 LAB — CMP (CANCER CENTER ONLY)
ALT: 14 U/L (ref 0–44)
AST: 16 U/L (ref 15–41)
Albumin: 4.1 g/dL (ref 3.5–5.0)
Alkaline Phosphatase: 79 U/L (ref 38–126)
Anion gap: 4 — ABNORMAL LOW (ref 5–15)
BUN: 13 mg/dL (ref 8–23)
CO2: 28 mmol/L (ref 22–32)
Calcium: 9 mg/dL (ref 8.9–10.3)
Chloride: 106 mmol/L (ref 98–111)
Creatinine: 0.84 mg/dL (ref 0.61–1.24)
GFR, Estimated: >60 mL/min (ref >60)
Glucose, Bld: 84 mg/dL (ref 70–99)
Potassium: 3.8 mmol/L (ref 3.5–5.1)
Sodium: 138 mmol/L (ref 135–145)
Total Bilirubin: 0.4 mg/dL (ref 0.3–1.2)
Total Protein: 7 g/dL (ref 6.5–8.1)

## 2022-07-17 LAB — TSH: TSH: 2.502 u[IU]/mL (ref 0.350–4.500)

## 2022-07-17 MED ORDER — SODIUM CHLORIDE 0.9 % IV SOLN
480.0000 mg | Freq: Once | INTRAVENOUS | Status: AC
  Administered 2022-07-17: 480 mg via INTRAVENOUS
  Filled 2022-07-17: qty 48

## 2022-07-17 MED ORDER — SODIUM CHLORIDE 0.9 % IV SOLN: Freq: Once | INTRAVENOUS | Status: AC

## 2022-07-17 MED ORDER — SODIUM CHLORIDE 0.9% FLUSH
10.0000 mL | INTRAVENOUS | Status: DC | PRN
  Administered 2022-07-17: 10 mL

## 2022-07-17 MED ORDER — HEPARIN SOD (PORK) LOCK FLUSH 100 UNIT/ML IV SOLN
500.0000 [IU] | Freq: Once | INTRAVENOUS | Status: AC | PRN
  Administered 2022-07-17: 500 [IU]

## 2022-07-17 MED ORDER — SODIUM CHLORIDE 0.9% FLUSH
10.0000 mL | Freq: Once | INTRAVENOUS | Status: AC
  Administered 2022-07-17: 10 mL

## 2022-07-17 NOTE — Patient Instructions (Signed)
[facility] MEDICAL ONCOLOGY  Discharge Instructions: Thank you for choosing [facility] to provide your oncology and hematology care.   If you have a lab appointment with the Cancer Center, please go directly to the Cancer Center and check in at the registration area.   Wear comfortable clothing and clothing appropriate for easy access to any Portacath or PICC line.   We strive to give you quality time with your provider. You may need to reschedule your appointment if you arrive late (15 or more minutes).  Arriving late affects you and other patients whose appointments are after yours.  Also, if you miss three or more appointments without notifying the office, you may be dismissed from the clinic at the provider's discretion.      For prescription refill requests, have your pharmacy contact our office and allow 72 hours for refills to be completed.    Today you received the following chemotherapy and/or immunotherapy agents: Opdivo      To help prevent nausea and vomiting after your treatment, we encourage you to take your nausea medication as directed.  BELOW ARE SYMPTOMS THAT SHOULD BE REPORTED IMMEDIATELY: *FEVER GREATER THAN 100.4 F (38 C) OR HIGHER *CHILLS OR SWEATING *NAUSEA AND VOMITING THAT IS NOT CONTROLLED WITH YOUR NAUSEA MEDICATION *UNUSUAL SHORTNESS OF BREATH *UNUSUAL BRUISING OR BLEEDING *URINARY PROBLEMS (pain or burning when urinating, or frequent urination) *BOWEL PROBLEMS (unusual diarrhea, constipation, pain near the anus) TENDERNESS IN MOUTH AND THROAT WITH OR WITHOUT PRESENCE OF ULCERS (sore throat, sores in mouth, or a toothache) UNUSUAL RASH, SWELLING OR PAIN  UNUSUAL VAGINAL DISCHARGE OR ITCHING   Items with * indicate a potential emergency and should be followed up as soon as possible or go to the Emergency Department if any problems should occur.  Please show the CHEMOTHERAPY ALERT CARD or IMMUNOTHERAPY ALERT CARD at check-in to the  Emergency Department and triage nurse.  Should you have questions after your visit or need to cancel or reschedule your appointment, please contact [facility]  Dept: [phone]  and follow the prompts.  Office hours are 8:00 a.m. to 4:30 p.m. Monday - Friday. Please note that voicemails left after 4:00 p.m. may not be returned until the following business day.  We are closed weekends and major holidays. You have access to a nurse at all times for urgent questions. Please call the main number to the clinic Dept: [phone] and follow the prompts.   For any non-urgent questions, you may also contact your provider using MyChart. We now offer e-Visits for anyone 18 and older to request care online for non-urgent symptoms. For details visit [email or web address].   Also download the MyChart app! Go to the app store, search "MyChart", open the app, select [facility], and log in with your MyChart username and password.  Masks are optional in the cancer centers. If you would like for your care team to wear a mask while they are taking care of you, please let them know. You may have one support person who is at least 66 years old accompany you for your appointments. 

## 2022-07-17 NOTE — Progress Notes (Signed)
[name]   [phone] Fax:([phone]) [phone]   Clinic Follow up Note   Patient Care Team: Cyndi Bender, Hershal Coria as PCP - General (Physician Assistant) [name], MD as Consulting Physician (Oncology)  Date of Service:  07/17/2022  CHIEF COMPLAINT: f/u of esophageal cancer  CURRENT THERAPY:  Adjuvant nivolumab, starting 10/10/21, currently q4weeks  ASSESSMENT & PLAN:  Nathaniel Jennings is a 66 y.o. male with   1. 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 EGD performed by Dr. Alessandra Bevels on 04/19/21 showed a partially obstructing tumor in distal esophagus and a polyp in mid esophagus. Biopsy of both confirmed invasive moderately differentiated adenocarcinoma of the esophagus, Her2 negative. -PET scan 05/16/21 showed: 6.2 cm distal esophageal mass; hypermetabolic upper right paratracheal lymph node. No other distant metastasis.  -EBUS biopsy on 05/26/21, path from 2R lymph node confirmed malignant cells consistent with his primary esophageal cancer. This node is not resectable by surgery  -He received concurrent chemoRT 05/24/21 - 07/04/21. He started with chemo TC then switched to FOLFOX on 06/20/21. He completed 4 cycles on 08/08/21. Posttreatment PET scan showed excellent response. -s/p esophagogastrectomy on 09/05/21 under Dr. Kipp Brood. Path showed 3 cm residual adenocarcinoma, margins are negative, 5/14 positive lymph nodes. -he began adjuvant nivolumab on 10/10/21. He is tolerating well overall. -restaging CT CAP on 07/13/22 showed: new 1.4 cm multinodular ground-glass and spiculation at left lung base, suggestive of infectious or inflammatory process; slight increase in size of right paratracheal node, now 9 mm.  -I reviewed the CT results with them today.  He does have reflux and possible aspiration, which can explain his CT scan findings,  cancer recurrence cannot be ruled out to be ruled out completely, I recommend obtaining PET scan to rule out cancer recurrence. I discussed possibly obtaining a bronchoscopy with biopsy if PET scan is positive. He agrees.  -will continue Nivolumab for now   2. Acid Reflux and possible aspiration  -he reports worsening reflux, despite sleeping in a recliner. -I previously prescribed protonix. I encouraged him to also try pepcid. -I also encouraged him to reach out to his GI doctor, Dr. Alessandra Bevels.  3. Comorbidities: HTN, BPH, Eczema  -continue medication, will monitor his blood pressure closely during treatment. -BP is up to 163/91 today (07/17/22). I encouraged him to get a cuff and monitor at home.     PLAN: -proceed with nivolumab today -PET scan to be done soon, with MyChart visit several days later to review results -lab, flush, and nivolumab every 4 weeks    No problem-specific Assessment & Plan notes found for this encounter.   SUMMARY OF ONCOLOGIC HISTORY: Oncology History Overview Note   Cancer Staging  Malignant neoplasm of overlapping sites of esophagus [facility]) Staging form: Esophagus - Adenocarcinoma, AJCC 8th Edition - Clinical stage from 05/13/2021: Stage IVA (cT3, cN2, cM0) - Signed by [name], MD on 05/22/2021    Malignant neoplasm of overlapping sites of esophagus ([place])  04/18/2021 Imaging   ULTRASOUND OF HEAD/NECK SOFT TISSUES  IMPRESSION: No suspicious lymphadenopathy   04/19/2021 Procedure   EGD  Impression: - Esophageal polyp(s) were found. Biopsied. - Partially obstructing, likely malignant esophageal tumor was found in the distal esophagus. - Small hiatal hernia. - A single gastric polyp. Biopsied. - Normal duodenal bulb, first portion of the duodenum and second portion of the duodenum.   04/19/2021 Pathology Results   FINAL MICROSCOPIC  DIAGNOSIS: Stomach, Biopsy - FUNDIC GLAND POLYP. Negative for dysplasia. NO Helicobacter pylori organisms seen on H  and E stain. Esophagus- Distal, Biopsy - INVASIVE MODERATELY DIFFERENTIATED ADENOCARCINOMA OF THE ESOPHAGUS.  - HER2 Study by Immunostain: Negative (score 1+) Esophagus- Mid, Biopsy - INVASIVE MODERATELY DIFFERENTIATED ADENOCARCINOMA OF THE ESOPHAGUS. - HER2 Study by Immunostain: Negative (score 1+)   05/05/2021 Imaging   CT CAP  IMPRESSION: 1. The patient's known esophageal mass is not well visualized. There is mild irregular wall thickening of the distal esophagus. Correlate with previous clinical evaluation. 2. Indeterminate mildly enlarged high right paratracheal node near the thoracic inlet. No other mediastinal adenopathy. 3. No evidence of metastatic disease in the abdomen or pelvis. 4. Stable small pulmonary nodules bilaterally consistent with benign findings. 5. Cholelithiasis, small renal cysts and Aortic Atherosclerosis (ICD10-I70.0).   05/09/2021 Initial Diagnosis   Malignant neoplasm of overlapping sites of esophagus ([place])   05/11/2021 Procedure   Upper EUS  Impression: - Mucosal nodule found in the esophagus. - Partially obstructing, malignant esophageal tumor was found at the gastroesophageal junction. - At least 3 discrete peritumoral lymph nodes - A mass was found in the gastroesophageal junction. A tissue diagnosis was obtained prior to this exam. This is of adenocarcinoma. This was staged T3 N2 Mx by endosonographic criteria. - Unable to traverse GE junction with either EGD scope or radial EUS scope.   05/13/2021 Cancer Staging   Staging form: Esophagus - Adenocarcinoma, AJCC 8th Edition - Clinical stage from 05/13/2021: Stage IVA (cT3, cN2, cM0) - Signed by [name], MD on 05/22/2021   05/16/2021 PET scan   IMPRESSION: 1. Distal esophageal mass measuring about 6.2 cm in length, maximum SUV 13.1. 2. Mildly hypermetabolic and mildly enlarged upper right paratracheal lymph node, 1.3 cm in short axis with maximum SUV 3.6, concerning for malignant involvement. 3.  Hypodense 1.1 cm right thyroid nodule has a maximum SUV substantially above that of the background thyroid. A significant minority of such nodules can harbor thyroid cancer. Recommend thyroid US and biopsy (ref: J Am Coll Radiol. 2015 Feb;12(2): 143-50). 4. 3 by 4 mm right lower lobe pulmonary nodule is similar to 05/05/2021, not hypermetabolic but below sensitive PET-CT size thresholds. Surveillance recommended. 5. Other imaging findings of potential clinical significance: Chronic left maxillary sinusitis. Aortic Atherosclerosis (ICD10-I70.0). Cholelithiasis. Prostatomegaly.   05/24/2021 - 06/13/2021 Chemotherapy         05/26/2021 Pathology Results   FINAL MICROSCOPIC DIAGNOSIS:   A. LYMPH NODE, 2R, FINE NEEDLE ASPIRATION:  - Malignant cells consistent with non-small cell carcinoma   COMMENT:  The cells are positive for cytokeratin 20 and CDX-2 (patchy). TTF-1, NapsinA, cytokeratin 5/6, p40, S100, MelanA, and cytokeratin 7 are negative. The patient's history of esophageal adenocarcinoma is noted and CDX-2 positivity is consistent with a gastrointestinal primary. This cytokeratin7/20 profile is usually seen in lower tract tumors, but this pattern can be seen in gastric/esophageal cases.   05/27/2021 - 07/04/2021 Radiation Therapy   Per Dr. Isidore Moos   06/20/2021 - 08/08/2021 Chemotherapy   Patient is on Treatment Plan : GASTROESOPHAGEAL FOLFOX q14d x 6 cycles     08/04/2021 PET scan   IMPRESSION: 1. Interval development of a new 6 mm short axis right cervical node with hypermetabolism. Finding is somewhat indeterminate given the apparent response of the hypermetabolic right paratracheal lymphadenopathy seen on the previous study. Close follow-up recommended as metastatic disease not excluded. 2. Diffuse hypermetabolic FDG accumulation noted in the esophagus today with more diffuse circumferential esophageal  wall thickening on today's study than on the previous exam. Hypermetabolism in the  distal esophagus at the level of the hypermetabolic neoplasm previously has decreased from SUV max = 13 to SUV max = 7 today. 3. Stable tiny 3-4 mm right lower lobe and left upper lobe pulmonary nodules. Continued attention on follow-up recommended. 4. Cholelithiasis. 5. Emphysema.  (ICD10-J43.9) 6.  Aortic Atherosclerois (ICD10-170.0)   08/17/2021 Pathology Results   FINAL MICROSCOPIC DIAGNOSIS:  A. LYMPH NODE, RIGHT CERVICAL, FINE NEEDLE ASPIRATION:  - No malignant cells identified  - See comment   COMMENT:  There is scant cellularity and the specimen may not be representative.    09/05/2021 Definitive Surgery   FINAL MICROSCOPIC DIAGNOSIS:   A. ESOPHAGOGASTRECTOMY:  - Invasive poorly differentiated adenocarcinoma.  - Metastatic carcinoma involving 5 of 13 lymph nodes (5/13).  - See oncology table below.   B. LYMPH NODE, LEVEL 8, EXCISION:  - One lymph node, negative for malignancy (0/1).   09/05/2021 Cancer Staging   Staging form: Esophagus - Adenocarcinoma, AJCC 8th Edition - Pathologic stage from 09/05/2021: Stage IIIB (ypT3, pN2, cM0, G3) - Signed by [name], MD on 12/03/2021 Stage prefix: Post-therapy Response to neoadjuvant therapy: Partial response Histologic grading system: 3 grade system Residual tumor (R): R0 - None   10/10/2021 - 06/19/2022 Chemotherapy   Patient is on Treatment Plan : GASTROESOPHAGEAL Nivolumab q14d x 8 cycles / Nivolumab q28d     10/10/2021 -  Chemotherapy   Patient is on Treatment Plan : GASTROESOPHAGEAL Nivolumab (240) q14d x 8 cycles / Nivolumab (480) q28d     12/30/2021 Imaging   EXAM: CT CHEST, ABDOMEN, AND PELVIS WITH CONTRAST  IMPRESSION: Status post esophagectomy with gastric pull-through procedure. No evidence of recurrent or metastatic carcinoma within the chest, abdomen, or pelvis.   Cholelithiasis. No radiographic evidence of cholecystitis.   Stable enlarged prostate.   Aortic Atherosclerosis (ICD10-I70.0).       INTERVAL HISTORY:  JESSIE SCHRIEBER is here for a follow up of esophageal cancer. He was last seen by me on 05/22/22. He presents to the clinic accompanied by his wife. He reports he is having worsening reflux, despite still sleeping in a recliner.   All other systems were reviewed with the patient and are negative.  MEDICAL HISTORY:  Past Medical History:  Diagnosis Date   Arthritis    BPH (benign prostatic hyperplasia)    Dyslipidemia    Elevated PSA    Esophageal cancer ([place])    Full dentures    GERD (gastroesophageal reflux disease)    History of melanoma excision    07/ 2011  s/p  wide excision left back--- per pathology-- negative maligant --  mild melanocytic atypia   History of palpitations    cardiologist --  dr Shelva Majestic (last note in epic 2014   Hypertension    Sleep apnea     SURGICAL HISTORY: Past Surgical History:  Procedure Laterality Date   COLONOSCOPY  10/24/2007   ESOPHAGOGASTRODUODENOSCOPY N/A 09/05/2021   Procedure: ESOPHAGOGASTRODUODENOSCOPY (EGD);  Surgeon: Lajuana Matte, MD;  [facility];  Service: Thoracic;  Laterality: N/A;   ESOPHAGOGASTRODUODENOSCOPY (EGD) WITH PROPOFOL N/A 05/11/2021   Procedure: ESOPHAGOGASTRODUODENOSCOPY (EGD) WITH PROPOFOL;  Surgeon: Arta Silence, MD;  Location: [facility] ENDOSCOPY;  Service: Endoscopy;  Laterality: N/A;   EUS N/A 05/11/2021   Procedure: ESOPHAGEAL ENDOSCOPIC ULTRASOUND (EUS) RADIAL;  Surgeon: Arta Silence, MD;  Location: [facility] ENDOSCOPY;  Service: Endoscopy;  Laterality: N/A;   EYE SURGERY  Lasik surgery on both eyes, but still needs glasses   FINE NEEDLE ASPIRATION  05/26/2021   Procedure: FINE NEEDLE ASPIRATION (FNA) LINEAR;  Surgeon: Garner Nash, DO;  Location: [facility];  Service: Pulmonary;;   INTERCOSTAL NERVE BLOCK  09/05/2021   Procedure: INTERCOSTAL NERVE BLOCK;  Surgeon: Lajuana Matte, MD;  Location: [place];  Service: Thoracic;;   IR IMAGING GUIDED PORT INSERTION  06/30/2021    JEJUNOSTOMY  09/05/2021   Procedure: Shanon Rosser;  Surgeon: Lajuana Matte, MD;  Location: [place];  Service: Thoracic;;   PROSTATE BIOPSY N/A 07/02/2015   Procedure: BIOPSY TRANSRECTAL ULTRASONIC PROSTATE (TUBP);  Surgeon: Lowella Bandy, MD;  Location: [facility];  Service: Urology;  Laterality: N/A;   PROSTATE BIOPSY N/A 11/22/2015   Procedure: BIOPSY TRANSRECTAL ULTRASONIC PROSTATE (TUBP);  Surgeon: Cleon Gustin, MD;  Location: [facility];  Service: Urology;  Laterality: N/A;   TRANSTHORACIC ECHOCARDIOGRAM  11/19/2012   normal echo/  trivial TR   VIDEO BRONCHOSCOPY WITH ENDOBRONCHIAL ULTRASOUND N/A 05/26/2021   Procedure: VIDEO BRONCHOSCOPY WITH ENDOBRONCHIAL ULTRASOUND;  Surgeon: Garner Nash, DO;  Location: [place];  Service: Pulmonary;  Laterality: N/A;   WIDE EXCISION MELANOMA LEFT BACK  05/02/2010    I have reviewed the social history and family history with the patient and they are unchanged from previous note.  ALLERGIES:  is allergic to bee venom.  MEDICATIONS:  Current Outpatient Medications  Medication Sig Dispense Refill   aspirin EC 81 MG tablet Take 81 mg by mouth in the morning.     ketoconazole (NIZORAL) 2 % cream APPLY 1 APPLICATION TOPICALLY DAILY 15 g 0   metoprolol succinate (TOPROL-XL) 50 MG 24 hr tablet Take 50 mg by mouth daily. Take with or immediately following a meal.     Multiple Vitamin (MULTIVITAMIN WITH MINERALS) TABS tablet Take 1 tablet by mouth daily.     pantoprazole (PROTONIX) 40 MG tablet Take 1 tablet (40 mg total) by mouth 2 (two) times daily. 30 tablet 1   Skin Protectants, Misc. (EUCERIN) cream Apply 1 application. topically daily as needed for dry skin.     No current facility-administered medications for this visit.    PHYSICAL EXAMINATION: ECOG PERFORMANCE STATUS: 1 - Symptomatic but completely ambulatory  Vitals:   07/17/22 0909  BP: (!) 163/91  Pulse: 66  Resp: 18  Temp: 97.8 F (36.6  C)  SpO2: 100%   Wt Readings from Last 3 Encounters:  07/17/22 152 lb 8 oz (69.2 kg)  06/19/22 151 lb 4 oz (68.6 kg)  05/22/22 148 lb 6.4 oz (67.3 kg)     GENERAL:alert, no distress and comfortable SKIN: skin color normal, no rashes or significant lesions EYES: normal, Conjunctiva are pink and non-injected, sclera clear  NEURO: alert & oriented x 3 with fluent speech  LABORATORY DATA:  I have reviewed the data as listed    Latest Ref Rng & Units 07/17/2022    8:51 AM 06/19/2022   11:12 AM 05/22/2022    9:50 AM  CBC  WBC 4.0 - 10.5 K/uL 5.2  4.8  3.6   Hemoglobin 13.0 - 17.0 g/dL 13.0  11.8  12.5   Hematocrit 39.0 - 52.0 % 37.2  33.6  35.9   Platelets 150 - 400 K/uL 143  164  117         Latest Ref Rng & Units 06/19/2022   11:12 AM 05/22/2022    9:50 AM 04/24/2022   12:54  PM  CMP  Glucose 70 - 99 mg/dL 98  112  130   BUN 8 - 23 mg/dL _0 Creatinine 0.61 - 1.24 mg/dL 0.60  0.80  0.86   Sodium 135 - 145 mmol/L 141  139  138   Potassium 3.5 - 5.1 mmol/L 3.8  3.7  3.9   Chloride 98 - 111 mmol/L 107  106  105   CO2 22 - 32 mmol/L _1 Calcium 8.9 - 10.3 mg/dL 9.3  8.9  9.1   Total Protein 6.5 - 8.1 g/dL 7.0  7.1  7.0   Total Bilirubin 0.3 - 1.2 mg/dL 0.3  0.4  0.6   Alkaline Phos 38 - 126 U/L 85  74  90   AST 15 - 41 U/L _2 ALT 0 - 44 U/L _3 RADIOGRAPHIC STUDIES: I have personally reviewed the radiological images as listed and agreed with the findings in the report. No results found.    Orders Placed This Encounter  Procedures   NM PET Image Restag (PS) Skull Base To Thigh    Abnormal CT chest findings, rule out cancer recurrence    Standing Status:   Future    Standing Expiration Date:   07/17/2023    Order Specific Question:   If indicated for the ordered procedure, I authorize the administration of a radiopharmaceutical per Radiology protocol    Answer:   Yes    Order Specific Question:   Preferred imaging location?     Answer:   [facility]   All questions were answered. The patient knows to call the clinic with any problems, questions or concerns. No barriers to learning was detected. The total time spent in the appointment was 30 minutes.     [name], MD 07/17/2022   I, Wilburn Mylar, am acting as scribe for [name], MD.   I have reviewed the above documentation for accuracy and completeness, and I agree with the above.

## 2022-07-18 ENCOUNTER — Telehealth: Payer: Self-pay | Admitting: Hematology

## 2022-07-18 ENCOUNTER — Encounter: Payer: Self-pay | Admitting: Hematology

## 2022-07-18 NOTE — Telephone Encounter (Signed)
Scheduled follow-up appointment per 9/25 secure chat. Patient is aware.

## 2022-07-19 LAB — T4: T4, Total: 7.3 ug/dL (ref 4.5–12.0)

## 2022-07-20 DIAGNOSIS — K2100 Gastro-esophageal reflux disease with esophagitis, without bleeding: Secondary | ICD-10-CM | POA: Diagnosis not present

## 2022-07-20 DIAGNOSIS — Z8501 Personal history of malignant neoplasm of esophagus: Secondary | ICD-10-CM | POA: Diagnosis not present

## 2022-07-24 ENCOUNTER — Other Ambulatory Visit: Payer: Self-pay

## 2022-07-24 DIAGNOSIS — Z9889 Other specified postprocedural states: Secondary | ICD-10-CM | POA: Diagnosis not present

## 2022-07-24 DIAGNOSIS — K2930 Chronic superficial gastritis without bleeding: Secondary | ICD-10-CM | POA: Diagnosis not present

## 2022-07-24 DIAGNOSIS — J309 Allergic rhinitis, unspecified: Secondary | ICD-10-CM | POA: Diagnosis not present

## 2022-07-24 DIAGNOSIS — Z9181 History of falling: Secondary | ICD-10-CM | POA: Diagnosis not present

## 2022-07-24 DIAGNOSIS — Z1331 Encounter for screening for depression: Secondary | ICD-10-CM | POA: Diagnosis not present

## 2022-07-24 DIAGNOSIS — K2970 Gastritis, unspecified, without bleeding: Secondary | ICD-10-CM | POA: Diagnosis not present

## 2022-07-24 DIAGNOSIS — H65111 Acute and subacute allergic otitis media (mucoid) (sanguinous) (serous), right ear: Secondary | ICD-10-CM | POA: Diagnosis not present

## 2022-07-24 DIAGNOSIS — R12 Heartburn: Secondary | ICD-10-CM | POA: Diagnosis not present

## 2022-07-24 DIAGNOSIS — K3189 Other diseases of stomach and duodenum: Secondary | ICD-10-CM | POA: Diagnosis not present

## 2022-07-31 ENCOUNTER — Ambulatory Visit (HOSPITAL_COMMUNITY)
Admission: RE | Admit: 2022-07-31 | Discharge: 2022-07-31 | Disposition: A | Discharge status: Home / Self Care | Payer: BC Managed Care – PPO | Source: Ambulatory Visit | Attending: Hematology | Admitting: Hematology

## 2022-07-31 DIAGNOSIS — C158 Malignant neoplasm of overlapping sites of esophagus: Secondary | ICD-10-CM | POA: Diagnosis not present

## 2022-07-31 DIAGNOSIS — C159 Malignant neoplasm of esophagus, unspecified: Secondary | ICD-10-CM | POA: Diagnosis not present

## 2022-07-31 LAB — GLUCOSE, CAPILLARY: Glucose-Capillary: 111 mg/dL — ABNORMAL HIGH (ref 70–99)

## 2022-07-31 MED ORDER — FLUDEOXYGLUCOSE F - 18 (FDG) INJECTION
7.5000 | Freq: Once | INTRAVENOUS | Status: AC | PRN
  Administered 2022-07-31: 7.56 via INTRAVENOUS

## 2022-08-02 ENCOUNTER — Encounter: Payer: Self-pay | Admitting: Hematology

## 2022-08-02 ENCOUNTER — Inpatient Hospital Stay: Payer: BC Managed Care – PPO | Attending: Hematology | Admitting: Hematology

## 2022-08-02 DIAGNOSIS — I10 Essential (primary) hypertension: Secondary | ICD-10-CM | POA: Insufficient documentation

## 2022-08-02 DIAGNOSIS — K219 Gastro-esophageal reflux disease without esophagitis: Secondary | ICD-10-CM | POA: Diagnosis not present

## 2022-08-02 DIAGNOSIS — C158 Malignant neoplasm of overlapping sites of esophagus: Secondary | ICD-10-CM | POA: Insufficient documentation

## 2022-08-02 DIAGNOSIS — Z85820 Personal history of malignant melanoma of skin: Secondary | ICD-10-CM | POA: Insufficient documentation

## 2022-08-02 DIAGNOSIS — E041 Nontoxic single thyroid nodule: Secondary | ICD-10-CM | POA: Insufficient documentation

## 2022-08-02 DIAGNOSIS — Z5112 Encounter for antineoplastic immunotherapy: Secondary | ICD-10-CM | POA: Insufficient documentation

## 2022-08-02 NOTE — Progress Notes (Signed)
[place]   Telephone:([phone]) [phone] [phone]) [phone]   Clinic Follow up Note   Patient Care Team: Cyndi Bender, Hershal Coria as PCP - General (Physician Assistant) [name], MD as Consulting Physician (Oncology)  Date of Service:  08/02/2022  I connected with Nathaniel Jennings on 08/02/2022 at 10:00 AM EDT by video enabled telemedicine visit and verified that I am speaking with the correct person using two identifiers.  I discussed the limitations, risks, security and privacy concerns of performing an evaluation and management service by telephone and the availability of in person appointments. I also discussed with the patient that there may be a patient responsible charge related to this service. The patient expressed understanding and agreed to proceed.   Other persons participating in the visit and their role in the encounter:  pt's wife  Patient's location:  home Provider's location:  my office  CHIEF COMPLAINT: f/u of esophageal cancer  CURRENT THERAPY:  Adjuvant nivolumab, starting 10/10/21, currently q4weeks  ASSESSMENT & PLAN:  Nathaniel Jennings is a 66 y.o. male with   1. 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 EGD performed by Dr. Alessandra Bevels on 04/19/21 showed a partially obstructing tumor in distal esophagus and a polyp in mid esophagus. Biopsy of both confirmed invasive moderately differentiated adenocarcinoma of the esophagus, Her2 negative. -PET scan 05/16/21 showed: 6.2 cm distal esophageal mass; hypermetabolic upper right paratracheal lymph node. No other distant metastasis.  -EBUS biopsy on 05/26/21, path from 2R lymph node confirmed malignant cells consistent with his primary esophageal cancer. This node is not resectable by surgery  -He received concurrent chemoRT 05/24/21 - 07/04/21. He started with chemo TC then switched to  FOLFOX on 06/20/21. He completed 4 cycles on 08/08/21. Posttreatment PET scan showed excellent response. -s/p esophagogastrectomy on 09/05/21 under Dr. Kipp Brood. Path showed 3 cm residual adenocarcinoma, margins are negative, 5/14 positive lymph nodes. -he began adjuvant nivolumab on 10/10/21. He is tolerating well overall. -restaging CT CAP on 07/13/22 showed: new 1.4 cm multinodular ground-glass and spiculation at left lung base, suggestive of infectious or inflammatory process; slight increase in size of right paratracheal node, now 9 mm.  -further evaluation with PET scan 07/31/22 showed: hypermetabolism to LLL nodularity, favoring chronic aspiration or less likely atypical infection; otherwise, no evidence of tracer-avid metastasis; hypermetabolic 50m right-sided thyroid nodule. -I reviewed the PET images and discussed the results with them today. I explained the left lung change could be related to aspiration but metastatic cancer is also posible. He denies cough but has had issues with reflux. We discussed the option of bronchoscopy to rule out metastatic disease in the lung. I will refer him to Dr. IValeta Harmsfor further discussion and recommendations. -will continue Nivolumab for now    2. Acid Reflux and possible aspiration  -he reports worsening reflux, despite sleeping in a recliner. -I previously prescribed protonix. I encouraged him to also try pepcid. -they report he recently underwent EGD with Dr. BAlessandra Bevels  3. Hypermetabolic thyroid nodule -seen on PET 05/16/21 (and not on head/neck [place] 04/18/21) -repeat head/neck [place] 06/03/21 showed a 1.1 cm inferior right nodule, corresponding to hypermetabolic focus on PET.  -Biopsy on 08/16/21 showed scant follicular epithelium. -seen again on PET 07/31/22, slightly smaller in size, consistent with benign etiology.     PLAN: -referral to Dr. IValeta Harmsto discuss possible bronchoscopy due to the abnormal PET scan findings  in left lung  -lab, flush,  and nivolumab 10/23 and every 4 weeks   No problem-specific Assessment & Plan notes found for this encounter.   SUMMARY OF ONCOLOGIC HISTORY: Oncology History Overview Note   Cancer Staging  Malignant neoplasm of overlapping sites of esophagus [facility]) Staging form: Esophagus - Adenocarcinoma, AJCC 8th Edition - Clinical stage from 05/13/2021: Stage IVA (cT3, cN2, cM0) - Signed by [name], MD on 05/22/2021    Malignant neoplasm of overlapping sites of esophagus ([place])  04/18/2021 Imaging   ULTRASOUND OF HEAD/NECK SOFT TISSUES  IMPRESSION: No suspicious lymphadenopathy   04/19/2021 Procedure   EGD  Impression: - Esophageal polyp(s) were found. Biopsied. - Partially obstructing, likely malignant esophageal tumor was found in the distal esophagus. - Small hiatal hernia. - A single gastric polyp. Biopsied. - Normal duodenal bulb, first portion of the duodenum and second portion of the duodenum.   04/19/2021 Pathology Results   FINAL MICROSCOPIC DIAGNOSIS: Stomach, Biopsy - FUNDIC GLAND POLYP. Negative for dysplasia. NO Helicobacter pylori organisms seen on H and E stain. Esophagus- Distal, Biopsy - INVASIVE MODERATELY DIFFERENTIATED ADENOCARCINOMA OF THE ESOPHAGUS.  - HER2 Study by Immunostain: Negative (score 1+) Esophagus- Mid, Biopsy - INVASIVE MODERATELY DIFFERENTIATED ADENOCARCINOMA OF THE ESOPHAGUS. - HER2 Study by Immunostain: Negative (score 1+)   05/05/2021 Imaging   CT CAP  IMPRESSION: 1. The patient's known esophageal mass is not well visualized. There is mild irregular wall thickening of the distal esophagus. Correlate with previous clinical evaluation. 2. Indeterminate mildly enlarged high right paratracheal node near the thoracic inlet. No other mediastinal adenopathy. 3. No evidence of metastatic disease in the abdomen or pelvis. 4. Stable small pulmonary nodules bilaterally consistent with benign findings. 5. Cholelithiasis, small renal cysts and Aortic  Atherosclerosis (ICD10-I70.0).   05/09/2021 Initial Diagnosis   Malignant neoplasm of overlapping sites of esophagus ([place])   05/11/2021 Procedure   Upper EUS  Impression: - Mucosal nodule found in the esophagus. - Partially obstructing, malignant esophageal tumor was found at the gastroesophageal junction. - At least 3 discrete peritumoral lymph nodes - A mass was found in the gastroesophageal junction. A tissue diagnosis was obtained prior to this exam. This is of adenocarcinoma. This was staged T3 N2 Mx by endosonographic criteria. - Unable to traverse GE junction with either EGD scope or radial EUS scope.   05/13/2021 Cancer Staging   Staging form: Esophagus - Adenocarcinoma, AJCC 8th Edition - Clinical stage from 05/13/2021: Stage IVA (cT3, cN2, cM0) - Signed by [name], MD on 05/22/2021   05/16/2021 PET scan   IMPRESSION: 1. Distal esophageal mass measuring about 6.2 cm in length, maximum SUV 13.1. 2. Mildly hypermetabolic and mildly enlarged upper right paratracheal lymph node, 1.3 cm in short axis with maximum SUV 3.6, concerning for malignant involvement. 3. Hypodense 1.1 cm right thyroid nodule has a maximum SUV substantially above that of the background thyroid. A significant minority of such nodules can harbor thyroid cancer. Recommend thyroid US and biopsy (ref: J Am Coll Radiol. 2015 Feb;12(2): 143-50). 4. 3 by 4 mm right lower lobe pulmonary nodule is similar to 05/05/2021, not hypermetabolic but below sensitive PET-CT size thresholds. Surveillance recommended. 5. Other imaging findings of potential clinical significance: Chronic left maxillary sinusitis. Aortic Atherosclerosis (ICD10-I70.0). Cholelithiasis. Prostatomegaly.   05/24/2021 - 06/13/2021 Chemotherapy         05/26/2021 Pathology Results   FINAL MICROSCOPIC DIAGNOSIS:   A. LYMPH NODE, 2R, FINE NEEDLE ASPIRATION:  - Malignant cells consistent with non-small cell  carcinoma   COMMENT:  The cells are  positive for cytokeratin 20 and CDX-2 (patchy). TTF-1, NapsinA, cytokeratin 5/6, p40, S100, MelanA, and cytokeratin 7 are negative. The patient's history of esophageal adenocarcinoma is noted and CDX-2 positivity is consistent with a gastrointestinal primary. This cytokeratin7/20 profile is usually seen in lower tract tumors, but this pattern can be seen in gastric/esophageal cases.   05/27/2021 - 07/04/2021 Radiation Therapy   Per Dr. Isidore Moos   06/20/2021 - 08/08/2021 Chemotherapy   Patient is on Treatment Plan : GASTROESOPHAGEAL FOLFOX q14d x 6 cycles     08/04/2021 PET scan   IMPRESSION: 1. Interval development of a new 6 mm short axis right cervical node with hypermetabolism. Finding is somewhat indeterminate given the apparent response of the hypermetabolic right paratracheal lymphadenopathy seen on the previous study. Close follow-up recommended as metastatic disease not excluded. 2. Diffuse hypermetabolic FDG accumulation noted in the esophagus today with more diffuse circumferential esophageal wall thickening on today's study than on the previous exam. Hypermetabolism in the distal esophagus at the level of the hypermetabolic neoplasm previously has decreased from SUV max = 13 to SUV max = 7 today. 3. Stable tiny 3-4 mm right lower lobe and left upper lobe pulmonary nodules. Continued attention on follow-up recommended. 4. Cholelithiasis. 5. Emphysema.  (ICD10-J43.9) 6.  Aortic Atherosclerois (ICD10-170.0)   08/17/2021 Pathology Results   FINAL MICROSCOPIC DIAGNOSIS:  A. LYMPH NODE, RIGHT CERVICAL, FINE NEEDLE ASPIRATION:  - No malignant cells identified  - See comment   COMMENT:  There is scant cellularity and the specimen may not be representative.    09/05/2021 Definitive Surgery   FINAL MICROSCOPIC DIAGNOSIS:   A. ESOPHAGOGASTRECTOMY:  - Invasive poorly differentiated adenocarcinoma.  - Metastatic carcinoma involving 5 of 13 lymph nodes (5/13).  - See oncology table below.    B. LYMPH NODE, LEVEL 8, EXCISION:  - One lymph node, negative for malignancy (0/1).   09/05/2021 Cancer Staging   Staging form: Esophagus - Adenocarcinoma, AJCC 8th Edition - Pathologic stage from 09/05/2021: Stage IIIB (ypT3, pN2, cM0, G3) - Signed by [name], MD on 12/03/2021 Stage prefix: Post-therapy Response to neoadjuvant therapy: Partial response Histologic grading system: 3 grade system Residual tumor (R): R0 - None   10/10/2021 - 06/19/2022 Chemotherapy   Patient is on Treatment Plan : GASTROESOPHAGEAL Nivolumab q14d x 8 cycles / Nivolumab q28d     10/10/2021 -  Chemotherapy   Patient is on Treatment Plan : GASTROESOPHAGEAL Nivolumab (240) q14d x 8 cycles / Nivolumab (480) q28d     12/30/2021 Imaging   EXAM: CT CHEST, ABDOMEN, AND PELVIS WITH CONTRAST  IMPRESSION: Status post esophagectomy with gastric pull-through procedure. No evidence of recurrent or metastatic carcinoma within the chest, abdomen, or pelvis.   Cholelithiasis. No radiographic evidence of cholecystitis.   Stable enlarged prostate.   Aortic Atherosclerosis (ICD10-I70.0).      INTERVAL HISTORY:  Nathaniel Jennings was contacted for a follow up of esophageal cancer. He was last seen by me on 07/17/22. They report he is doing well overall. He denies cough but reports continued reflux issues.    All other systems were reviewed with the patient and are negative.  MEDICAL HISTORY:  Past Medical History:  Diagnosis Date   Arthritis    BPH (benign prostatic hyperplasia)    Dyslipidemia    Elevated PSA    Esophageal cancer (HCC)    Full dentures    GERD (gastroesophageal reflux disease)    History of melanoma  excision    07/ 2011  s/p  wide excision left back--- per pathology-- negative maligant --  mild melanocytic atypia   History of palpitations    cardiologist --  dr Shelva Majestic (last note in epic 2014   Hypertension    Sleep apnea     SURGICAL HISTORY: Past Surgical History:  Procedure  Laterality Date   COLONOSCOPY  10/24/2007   ESOPHAGOGASTRODUODENOSCOPY N/A 09/05/2021   Procedure: ESOPHAGOGASTRODUODENOSCOPY (EGD);  Surgeon: Lajuana Matte, MD;  Location[facility];  Service: Thoracic;  Laterality: N/A;   ESOPHAGOGASTRODUODENOSCOPY (EGD) WITH PROPOFOL N/A 05/11/2021   Procedure: ESOPHAGOGASTRODUODENOSCOPY (EGD) WITH PROPOFOL;  Surgeon: Arta Silence, MD;  Location: [facility] END[facility]COPY;  Service: Endoscopy;  Laterality: N/A;   EUS N/A 05/11/2021   Procedure: ESOPHAGEAL ENDOSCOPIC ULTRASOUND (EUS) RADIAL;  Surgeon: Arta Silence, MD;  Location: [facility];  Service: Endoscopy;  Laterality: N/A;   EYE SURGERY     Lasik surgery on both eyes, but still needs glasses   FINE NEEDLE ASPIRATION  05/26/2021   Procedure: FINE NEEDLE ASPIRATION (FNA) LINEAR;  Surgeon: Garner Nash, DO;  Location: [facility];  Service: Pulmonary;;   INTERCOSTAL NERVE BLOCK  09/05/2021   Procedure: INTERCOSTAL NERVE BLOCK;  Surgeon: Lajuana Matte, MD;  Location: [place];  Service: Thoracic;;   IR IMAGING GUIDED PORT INSERTION  06/30/2021   JEJUNOSTOMY  09/05/2021   Procedure: Shanon Rosser;  Surgeon: Lajuana Matte, MD;  Location: [place];  Service: Thoracic;;   PROSTATE BIOPSY N/A 07/02/2015   Procedure: BIOPSY TRANSRECTAL ULTRASONIC PROSTATE (TUBP);  Surgeon: Lowella Bandy, MD;  [facility];  Service: Urology;  Laterality: N/A;   PROSTATE BIOPSY N/A 11/22/2015   Procedure: BIOPSY TRANSRECTAL ULTRASONIC PROSTATE (TUBP);  Surgeon: Cleon Gustin, MD;  Location[facility];  Service: Urology;  Laterality: N/A;   TRANSTHORACIC ECHOCARDIOGRAM  11/19/2012   normal echo/  trivial TR   VIDEO BRONCHOSCOPY WITH ENDOBRONCHIAL ULTRASOUND N/A 05/26/2021   Procedure: VIDEO BRONCHOSCOPY WITH ENDOBRONCHIAL ULTRASOUND;  Surgeon: Garner Nash, DO;  Location: [facility];  Service: Pulmonary;  Laterality: N/A;   WIDE EXCISION MELANOMA LEFT BACK  05/02/2010    I  have reviewed the social history and family history with the patient and they are unchanged from previous note.  ALLERGIES:  is allergic to bee venom.  MEDICATIONS:  Current Outpatient Medications  Medication Sig Dispense Refill   aspirin EC 81 MG tablet Take 81 mg by mouth in the morning.     ketoconazole (NIZORAL) 2 % cream APPLY 1 APPLICATION TOPICALLY DAILY 15 g 0   metoprolol succinate (TOPROL-XL) 50 MG 24 hr tablet Take 50 mg by mouth daily. Take with or immediately following a meal.     Multiple Vitamin (MULTIVITAMIN WITH MINERALS) TABS tablet Take 1 tablet by mouth daily.     pantoprazole (PROTONIX) 40 MG tablet Take 1 tablet (40 mg total) by mouth 2 (two) times daily. 30 tablet 1   Skin Protectants, Misc. (EUCERIN) cream Apply 1 application. topically daily as needed for dry skin.     No current facility-administered medications for this visit.    PHYSICAL EXAMINATION: ECOG PERFORMANCE STATUS: 1 - Symptomatic but completely ambulatory  There were no vitals filed for this visit. Wt Readings from Last 3 Encounters:  07/17/22 152 lb 8 oz (69.2 kg)  06/19/22 151 lb 4 oz (68.6 kg)  05/22/22 148 lb 6.4 oz (67.3 kg)     No vitals taken today, Exam  not performed today  LABORATORY DATA:  I have reviewed the data as listed    Latest Ref Rng & Units 07/17/2022    8:51 AM 06/19/2022   11:12 AM 05/22/2022    9:50 AM  CBC  WBC 4.0 - 10.5 K/uL 5.2  4.8  3.6   Hemoglobin 13.0 - 17.0 g/dL 13.0  11.8  12.5   Hematocrit 39.0 - 52.0 % 37.2  33.6  35.9   Platelets 150 - 400 K/uL 143  164  117         Latest Ref Rng & Units 07/17/2022    8:51 AM 06/19/2022   11:12 AM 05/22/2022    9:50 AM  CMP  Glucose 70 - 99 mg/dL 84  98  112   BUN 8 - 23 mg/dL _0 Creatinine 0.61 - 1.24 mg/dL 0.84  0.60  0.80   Sodium 135 - 145 mmol/L 138  141  139   Potassium 3.5 - 5.1 mmol/L 3.8  3.8  3.7   Chloride 98 - 111 mmol/L 106  107  106   CO2 22 - 32 mmol/L _1 Calcium 8.9 - 10.3  mg/dL 9.0  9.3  8.9   Total Protein 6.5 - 8.1 g/dL 7.0  7.0  7.1   Total Bilirubin 0.3 - 1.2 mg/dL 0.4  0.3  0.4   Alkaline Phos 38 - 126 U/L 79  85  74   AST 15 - 41 U/L _2 ALT 0 - 44 U/L _3 RADIOGRAPHIC STUDIES: I have personally reviewed the radiological images as listed and agreed with the findings in the report. No results found.    No orders of the defined types were placed in this encounter.  All questions were answered. The patient knows to call the clinic with any problems, questions or concerns. No barriers to learning was detected. The total time spent in the appointment was 30 minutes.     [name], MD 08/02/2022   I, Wilburn Mylar, am acting as scribe for [name], MD.   I have reviewed the above documentation for accuracy and completeness, and I agree with the above.

## 2022-08-05 ENCOUNTER — Other Ambulatory Visit: Payer: Self-pay

## 2022-08-06 ENCOUNTER — Other Ambulatory Visit: Payer: Self-pay

## 2022-08-12 NOTE — Progress Notes (Signed)
[place]   [phone] [phone]   Clinic Follow up Note   Patient Care Team: Cyndi Bender, Hershal Coria as PCP - General (Physician Assistant) [name], MD as Consulting Physician (Oncology) 08/14/2022  CHIEF COMPLAINT: Follow up esophagus cancer   SUMMARY OF ONCOLOGIC HISTORY: Oncology History Overview Note   Cancer Staging  Malignant neoplasm of overlapping sites of esophagus [facility]) Staging form: Esophagus - Adenocarcinoma, AJCC 8th Edition - Clinical stage from 05/13/2021: Stage IVA (cT3, cN2, cM0) - Signed by [name], MD on 05/22/2021    Malignant neoplasm of overlapping sites of esophagus ([place])  04/18/2021 Imaging   ULTRASOUND OF HEAD/NECK SOFT TISSUES  IMPRESSION: No suspicious lymphadenopathy   04/19/2021 Procedure   EGD  Impression: - Esophageal polyp(s) were found. Biopsied. - Partially obstructing, likely malignant esophageal tumor was found in the distal esophagus. - Small hiatal hernia. - A single gastric polyp. Biopsied. - Normal duodenal bulb, first portion of the duodenum and second portion of the duodenum.   04/19/2021 Pathology Results   FINAL MICROSCOPIC DIAGNOSIS: Stomach, Biopsy - FUNDIC GLAND POLYP. Negative for dysplasia. NO Helicobacter pylori organisms seen on H and E stain. Esophagus- Distal, Biopsy - INVASIVE MODERATELY DIFFERENTIATED ADENOCARCINOMA OF THE ESOPHAGUS.  - HER2 Study by Immunostain: Negative (score 1+) Esophagus- Mid, Biopsy - INVASIVE MODERATELY DIFFERENTIATED ADENOCARCINOMA OF THE ESOPHAGUS. - HER2 Study by Immunostain: Negative (score 1+)   05/05/2021 Imaging   CT CAP  IMPRESSION: 1. The patient's known esophageal mass is not well visualized. There is mild irregular wall thickening of the distal esophagus. Correlate with previous clinical evaluation. 2. Indeterminate mildly enlarged high right paratracheal node near the thoracic inlet. No other mediastinal adenopathy. 3. No evidence of  metastatic disease in the abdomen or pelvis. 4. Stable small pulmonary nodules bilaterally consistent with benign findings. 5. Cholelithiasis, small renal cysts and Aortic Atherosclerosis (ICD10-I70.0).   05/09/2021 Initial Diagnosis   Malignant neoplasm of overlapping sites of esophagus ([place])   05/11/2021 Procedure   Upper EUS  Impression: - Mucosal nodule found in the esophagus. - Partially obstructing, malignant esophageal tumor was found at the gastroesophageal junction. - At least 3 discrete peritumoral lymph nodes - A mass was found in the gastroesophageal junction. A tissue diagnosis was obtained prior to this exam. This is of adenocarcinoma. This was staged T3 N2 Mx by endosonographic criteria. - Unable to traverse GE junction with either EGD scope or radial EUS scope.   05/13/2021 Cancer Staging   Staging form: Esophagus - Adenocarcinoma, AJCC 8th Edition - Clinical stage from 05/13/2021: Stage IVA (cT3, cN2, cM0) - Signed by [name], MD on 05/22/2021   05/16/2021 PET scan   IMPRESSION: 1. Distal esophageal mass measuring about 6.2 cm in length, maximum SUV 13.1. 2. Mildly hypermetabolic and mildly enlarged upper right paratracheal lymph node, 1.3 cm in short axis with maximum SUV 3.6, concerning for malignant involvement. 3. Hypodense 1.1 cm right thyroid nodule has a maximum SUV substantially above that of the background thyroid. A significant minority of such nodules can harbor thyroid cancer. Recommend thyroid US and biopsy (ref: J Am Coll Radiol. 2015 Feb;12(2): 143-50). 4. 3 by 4 mm right lower lobe pulmonary nodule is similar to 05/05/2021, not hypermetabolic but below sensitive PET-CT size thresholds. Surveillance recommended. 5. Other imaging findings of potential clinical significance: Chronic left maxillary sinusitis. Aortic Atherosclerosis (ICD10-I70.0). Cholelithiasis. Prostatomegaly.   05/24/2021 - 06/13/2021 Chemotherapy         05/26/2021 Pathology Results  FINAL MICROSCOPIC DIAGNOSIS:   A. LYMPH NODE, 2R, FINE NEEDLE ASPIRATION:  - Malignant cells consistent with non-small cell carcinoma   COMMENT:  The cells are positive for cytokeratin 20 and CDX-2 (patchy). TTF-1, NapsinA, cytokeratin 5/6, p40, S100, MelanA, and cytokeratin 7 are negative. The patient's history of esophageal adenocarcinoma is noted and CDX-2 positivity is consistent with a gastrointestinal primary. This cytokeratin7/20 profile is usually seen in lower tract tumors, but this pattern can be seen in gastric/esophageal cases.   05/27/2021 - 07/04/2021 Radiation Therapy   Per Dr. Isidore Moos   06/20/2021 - 08/08/2021 Chemotherapy   Patient is on Treatment Plan : GASTROESOPHAGEAL FOLFOX q14d x 6 cycles     08/04/2021 PET scan   IMPRESSION: 1. Interval development of a new 6 mm short axis right cervical node with hypermetabolism. Finding is somewhat indeterminate given the apparent response of the hypermetabolic right paratracheal lymphadenopathy seen on the previous study. Close follow-up recommended as metastatic disease not excluded. 2. Diffuse hypermetabolic FDG accumulation noted in the esophagus today with more diffuse circumferential esophageal wall thickening on today's study than on the previous exam. Hypermetabolism in the distal esophagus at the level of the hypermetabolic neoplasm previously has decreased from SUV max = 13 to SUV max = 7 today. 3. Stable tiny 3-4 mm right lower lobe and left upper lobe pulmonary nodules. Continued attention on follow-up recommended. 4. Cholelithiasis. 5. Emphysema.  (ICD10-J43.9) 6.  Aortic Atherosclerois (ICD10-170.0)   08/17/2021 Pathology Results   FINAL MICROSCOPIC DIAGNOSIS:  A. LYMPH NODE, RIGHT CERVICAL, FINE NEEDLE ASPIRATION:  - No malignant cells identified  - See comment   COMMENT:  There is scant cellularity and the specimen may not be representative.    09/05/2021 Definitive Surgery   FINAL MICROSCOPIC DIAGNOSIS:   A.  ESOPHAGOGASTRECTOMY:  - Invasive poorly differentiated adenocarcinoma.  - Metastatic carcinoma involving 5 of 13 lymph nodes (5/13).  - See oncology table below.   B. LYMPH NODE, LEVEL 8, EXCISION:  - One lymph node, negative for malignancy (0/1).   09/05/2021 Cancer Staging   Staging form: Esophagus - Adenocarcinoma, AJCC 8th Edition - Pathologic stage from 09/05/2021: Stage IIIB (ypT3, pN2, cM0, G3) - Signed by [name], MD on 12/03/2021 Stage prefix: Post-therapy Response to neoadjuvant therapy: Partial response Histologic grading system: 3 grade system Residual tumor (R): R0 - None   10/10/2021 - 06/19/2022 Chemotherapy   Patient is on Treatment Plan : GASTROESOPHAGEAL Nivolumab q14d x 8 cycles / Nivolumab q28d     10/10/2021 -  Chemotherapy   Patient is on Treatment Plan : GASTROESOPHAGEAL Nivolumab (240) q14d x 8 cycles / Nivolumab (480) q28d     12/30/2021 Imaging   EXAM: CT CHEST, ABDOMEN, AND PELVIS WITH CONTRAST  IMPRESSION: Status post esophagectomy with gastric pull-through procedure. No evidence of recurrent or metastatic carcinoma within the chest, abdomen, or pelvis.   Cholelithiasis. No radiographic evidence of cholecystitis.   Stable enlarged prostate.   Aortic Atherosclerosis (ICD10-I70.0).     CURRENT THERAPY: Nivolumab, starting 10/10/21; currently q4 weeks  INTERVAL HISTORY:  Nathaniel Jennings returns for follow up as scheduled. Last seen by Dr. Burr Medico 07/17/22. PET scan was discussed among the team and Dr. Valeta Harms feels the lung changes to be infectious/inflammatory rather than melignant. Dr. Valeta Harms recommends treating with antibiotics and repeating CT chest in 4-6 weeks.   Nathaniel Jennings is doing well in general. Around the time of his PET scan he tells me he had a severe GERD flare where he threw  up in his mouth, coughed, then swallowed it back down. After that he had 2 episodes of subjective fever, chills, and felt tired had to nap. Never checked temp. GI changed his  GERD meds to sucralfate and omeprazole BID for 1 month then daily; he has not had an attack since then, and no recurrent fever.  All other systems were reviewed with the patient and are negative.  MEDICAL HISTORY:  Past Medical History:  Diagnosis Date   Arthritis    BPH (benign prostatic hyperplasia)    Dyslipidemia    Elevated PSA    Esophageal cancer ([place])    Full dentures    GERD (gastroesophageal reflux disease)    History of melanoma excision    07/ 2011  s/p  wide excision left back--- per pathology-- negative maligant --  mild melanocytic atypia   History of palpitations    cardiologist --  dr Shelva Majestic (last note in epic 2014   Hypertension    Sleep apnea     SURGICAL HISTORY: Past Surgical History:  Procedure Laterality Date   COLONOSCOPY  10/24/2007   ESOPHAGOGASTRODUODENOSCOPY N/A 09/05/2021   Procedure: ESOPHAGOGASTRODUODENOSCOPY (EGD);  Surgeon: Lajuana Matte, MD;  Location: [facility] OR;  Service: Thoracic;  Laterality: N/A;   ESOPHAGOGASTRODUODENOSCOPY (EGD) WITH PROPOFOL N/A 05/11/2021   Procedure: ESOPHAGOGASTRODUODENOSCOPY (EGD) WITH PROPOFOL;  Surgeon: Arta Silence, MD;  Location: [facility] ENDOSCOPY;  Service: Endoscopy;  Laterality: N/A;   EUS N/A 05/11/2021   Procedure: ESOPHAGEAL ENDOSCOPIC ULTRASOUND (EUS) RADIAL;  Surgeon: Arta Silence, MD;  Location: [facility] ENDOSCOPY;  Service: Endoscopy;  Laterality: N/A;   EYE SURGERY     Lasik surgery on both eyes, but still needs glasses   FINE NEEDLE ASPIRATION  05/26/2021   Procedure: FINE NEEDLE ASPIRATION (FNA) LINEAR;  Surgeon: Garner Nash, DO;  Location: [place] [facility];  Service: Pulmonary;;   INTERCOSTAL NERVE BLOCK  09/05/2021   Procedure: INTERCOSTAL NERVE BLOCK;  Surgeon: Lajuana Matte, MD;  Location: [place];  Service: Thoracic;;   IR IMAGING GUIDED PORT INSERTION  06/30/2021   JEJUNOSTOMY  09/05/2021   Procedure: Shanon Rosser;  Surgeon: Lajuana Matte, MD;  Location: [place];  Service: Thoracic;;    PROSTATE BIOPSY N/A 07/02/2015   Procedure: BIOPSY TRANSRECTAL ULTRASONIC PROSTATE (TUBP);  Surgeon: Lowella Bandy, MD;  Location: [facility];  Service: Urology;  Laterality: N/A;   PROSTATE BIOPSY N/A 11/22/2015   Procedure: BIOPSY TRANSRECTAL ULTRASONIC PROSTATE (TUBP);  Surgeon: Cleon Gustin, MD;  Location[facility];  Service: Urology;  Laterality: N/A;   TRANSTHORACIC ECHOCARDIOGRAM  11/19/2012   normal echo/  trivial TR   VIDEO BRONCHOSCOPY WITH ENDOBRONCHIAL ULTRASOUND N/A 05/26/2021   Procedure: VIDEO BRONCHOSCOPY WITH ENDOBRONCHIAL ULTRASOUND;  Surgeon: Garner Nash, DO;  Location: [place];  Service: Pulmonary;  Laterality: N/A;   WIDE EXCISION MELANOMA LEFT BACK  05/02/2010    I have reviewed the social history and family history with the patient and they are unchanged from previous note.  ALLERGIES:  is allergic to bee venom.  MEDICATIONS:  Current Outpatient Medications  Medication Sig Dispense Refill   amoxicillin-clavulanate (AUGMENTIN) 875-125 MG tablet Take 1 tablet by mouth 2 (two) times daily. 14 tablet 0   aspirin EC 81 MG tablet Take 81 mg by mouth in the morning.     ketoconazole (NIZORAL) 2 % cream APPLY 1 APPLICATION TOPICALLY DAILY 15 g 0   metoprolol succinate (TOPROL-XL) 50 MG 24 hr tablet Take 50 mg by  mouth daily. Take with or immediately following a meal.     Multiple Vitamin (MULTIVITAMIN WITH MINERALS) TABS tablet Take 1 tablet by mouth daily.     omeprazole (PRILOSEC) 40 MG capsule Take 40 mg by mouth 2 (two) times daily.     Skin Protectants, Misc. (EUCERIN) cream Apply 1 application. topically daily as needed for dry skin.     sucralfate (CARAFATE) 1 g tablet Take 1 g by mouth 2 (two) times daily.     No current facility-administered medications for this visit.   Facility-Administered Medications Ordered in Other Visits  Medication Dose Route Frequency Provider Last Rate Last Admin   sodium chloride flush  (NS) 0.9 % injection 10 mL  10 mL Intracatheter PRN [name], MD   10 mL at 08/14/22 1106    PHYSICAL EXAMINATION: ECOG PERFORMANCE STATUS: 1 - Symptomatic but completely ambulatory  Vitals:   08/14/22 0907  BP: (!) 168/88  Pulse: 66  Temp: 97.9 F (36.6 C)  SpO2: 100%   Filed Weights   08/14/22 0907  Weight: 152 lb 3.2 oz (69 kg)    GENERAL:alert, no distress and comfortable SKIN: No rash EYES:  sclera clear LUNGS: clear with normal breathing effort HEART: regular rate & rhythm, no lower extremity edema NEURO: alert & oriented x 3 with fluent speech, no focal motor/sensory deficits PAC without erythema  LABORATORY DATA:  I have reviewed the data as listed    Latest Ref Rng & Units 08/14/2022    8:50 AM 07/17/2022    8:51 AM 06/19/2022   11:12 AM  CBC  WBC 4.0 - 10.5 K/uL 4.2  5.2  4.8   Hemoglobin 13.0 - 17.0 g/dL 12.9  13.0  11.8   Hematocrit 39.0 - 52.0 % 37.2  37.2  33.6   Platelets 150 - 400 K/uL 148  143  164         Latest Ref Rng & Units 08/14/2022    8:50 AM 07/17/2022    8:51 AM 06/19/2022   11:12 AM  CMP  Glucose 70 - 99 mg/dL 96  84  98   BUN 8 - 23 mg/dL _0 Creatinine 0.61 - 1.24 mg/dL 0.83  0.84  0.60   Sodium 135 - 145 mmol/L 139  138  141   Potassium 3.5 - 5.1 mmol/L 3.9  3.8  3.8   Chloride 98 - 111 mmol/L 106  106  107   CO2 22 - 32 mmol/L _1 Calcium 8.9 - 10.3 mg/dL 9.2  9.0  9.3   Total Protein 6.5 - 8.1 g/dL 6.9  7.0  7.0   Total Bilirubin 0.3 - 1.2 mg/dL 0.6  0.4  0.3   Alkaline Phos 38 - 126 U/L 70  79  85   AST 15 - 41 U/L _2 ALT 0 - 44 U/L _3 RADIOGRAPHIC STUDIES: I have personally reviewed the radiological images as listed and agreed with the findings in the report. No results found.   ASSESSMENT & PLAN: Nathaniel Jennings is a 66 y.o. male with    1. Two esophageal Adenocarcinoma in mid and distal esophagus, distal cT3N2Mx with upper mediastinal adenopathy -He was initially referred  to [facility] for dysphasia with solid food and regurgitation. -EGD performed by Dr. Alessandra Bevels on 04/19/21 showed a partially obstructing, likely malignant, esophageal tumor  in the distal esophagus and a polyp in the mid esophagus. Biopsy of both confirmed invasive moderately differentiated adenocarcinoma of the esophagus, Her2 negative. -CT CAP on 05/05/21 showing: esophageal mass not well visualized; indeterminate mildly-enlarged high right paratracheal node near thoracic inlet; no other mediastinal adenopathy or evidence of metastatic disease in abdomen or pelvis; stable small pulmonary nodules bilaterally. -EUS 05/11/21 staged this as T3 N2 Mx. -PET scan 05/16/21 showed: 6.2 cm distal esophageal mass; hypermetabolic upper right paratracheal lymph node.  No other distant metastasis.  -EBUS biopsy on 05/26/21, path from 2R lymph node confirmed malignant cells consistent with his primary esophageal cancer. This node is not resectable by surgery  -He began concurrent chemoRT with weekly TC on 05/24/21. He completed radiation therapy on 07/04/21. -We changed his chemo to FOLFOX on 06/20/21, at reduced dose for first cycle due to mild neutropenia and concurrent RT. Tolerated well. He completed 4 doses -restaging PET 08/04/21 showed showed excellent response  -s/p esophagogastrectomy 09/05/21 by Dr. Kipp Brood; path showed invasive poorly differentiated adenocarcinoma, rare single tumor cells presents. Margins negative. Metastatic carcinoma involving 5 out of 14 (5/14) LNs -He began adjuvant nivolumab 10/10/21. Tolerating well except GERD flares. Now q4 weeks -restaging CT CAP on 07/13/22 showed: new 1.4 cm multinodular ground-glass and spiculation at left lung base, suggestive of infectious or inflammatory process; slight increase in size of right paratracheal node, now 9 mm.  -further evaluation with PET scan 07/31/22 showed: hypermetabolism to LLL nodularity, favoring chronic aspiration or less likely atypical  infection; otherwise, no evidence of tracer-avid metastasis; hypermetabolic 45m right-sided thyroid nodule. -The case was reviewed with Dr. IValeta Harmswith pulmonology who favors the hypermetabolism to reflect aspiration-related inflammation/infection. We do not feel this is immune-related pneumonitis changes from Nivo. -Pulm recommends to hold bronchoscopy for now and treat with Augmentin x1 week and repeat CT chest in 4-6 weeks.  Patient agrees, Rx sent he will start tonight or tomorrow -Nathaniel Jennings otherwise doing well, GERD better managed with Sucralfate and omeprazole 40 mg twice daily for 30 days then will change to once daily per GI; no other significant side effects from treatment. -Labs reviewed, adequate to proceed with nivolumab today as planned -Follow-up and next cycle in 4 weeks, then CT chest in 5-6 weeks and phone visit after  2.  GERD and possible aspiration -Worsening GERD on nivolumab, he tried Protonix and Pepcid -Patient endorses a GERD attack around the time of the PET scan where he feels he did aspirate -GI Dr. BAlessandra Bevelsrecently prescribed sucralfate and omeprazole 40 mg twice daily for 1 month, then 40 mg daily -Improved on recent medication change   3. Hypermetabolic thyroid nodule -seen on PET 05/16/21 (and not on head/neck [place] 04/18/[place]) -repeat head/neck [place] 06/03/21 showed a 1.1 cm inferior right nodule, corresponding to hypermetabolic focus on PET.  -Biopsy 08/16/2021 showed scant follicular epithelium -Seen again on PET 07/31/2022, slightly smaller in size consistent with benign etiology   4. HTN and BPH -continue medication, will monitor his blood pressure closely during treatment.   Plan: -Labs reviewed -Case discussed with pulmonology and Dr. FBurr Medico will hold bronch/biopsy for now -Plan to treat lung changes/possible aspiration with Augmentin x7 days then repeat CT chest in 4-6 weeks, patient agrees -GERD med adjustments per GI -Proceed with nivolumab today  as planned -Follow-up and next cycle in 4 weeks as scheduled    Orders Placed This Encounter  Procedures   CT Chest Wo Contrast    Standing Status:  Future    Standing Expiration Date:   08/14/2023    Order Specific Question:   Preferred imaging location?    Answer:   [facility]   All questions were answered. The patient knows to call the clinic with any problems, questions or concerns. No barriers to learning was detected. I spent 20 minutes counseling the patient face to face. The total time spent in the appointment was 30 minutes and more than 50% was on counseling and review of test results and coordination of care.      Alla Feeling, NP 08/14/22

## 2022-08-14 ENCOUNTER — Inpatient Hospital Stay: Payer: BC Managed Care – PPO

## 2022-08-14 ENCOUNTER — Encounter: Payer: Self-pay | Admitting: Hematology

## 2022-08-14 ENCOUNTER — Inpatient Hospital Stay (HOSPITAL_BASED_OUTPATIENT_CLINIC_OR_DEPARTMENT_OTHER): Payer: BC Managed Care – PPO | Admitting: Nurse Practitioner

## 2022-08-14 ENCOUNTER — Encounter: Payer: Self-pay | Admitting: Nurse Practitioner

## 2022-08-14 DIAGNOSIS — C158 Malignant neoplasm of overlapping sites of esophagus: Secondary | ICD-10-CM | POA: Diagnosis not present

## 2022-08-14 DIAGNOSIS — Z85820 Personal history of malignant melanoma of skin: Secondary | ICD-10-CM | POA: Diagnosis not present

## 2022-08-14 DIAGNOSIS — E041 Nontoxic single thyroid nodule: Secondary | ICD-10-CM | POA: Diagnosis not present

## 2022-08-14 DIAGNOSIS — Z5112 Encounter for antineoplastic immunotherapy: Secondary | ICD-10-CM | POA: Diagnosis not present

## 2022-08-14 DIAGNOSIS — I10 Essential (primary) hypertension: Secondary | ICD-10-CM | POA: Diagnosis not present

## 2022-08-14 LAB — CBC WITH DIFFERENTIAL (CANCER CENTER ONLY)
Abs Immature Granulocytes: 0.02 10*3/uL (ref 0.00–0.07)
Basophils Absolute: 0.1 10*3/uL (ref 0.0–0.1)
Basophils Relative: 1 %
Eosinophils Absolute: 0.2 10*3/uL (ref 0.0–0.5)
Eosinophils Relative: 4 %
HCT: 37.2 % — ABNORMAL LOW (ref 39.0–52.0)
Hemoglobin: 12.9 g/dL — ABNORMAL LOW (ref 13.0–17.0)
Immature Granulocytes: 1 %
Lymphocytes Relative: 20 %
Lymphs Abs: 0.9 10*3/uL (ref 0.7–4.0)
MCH: 31.1 pg (ref 26.0–34.0)
MCHC: 34.7 g/dL (ref 30.0–36.0)
MCV: 89.6 fL (ref 80.0–100.0)
Monocytes Absolute: 0.4 10*3/uL (ref 0.1–1.0)
Monocytes Relative: 10 %
Neutro Abs: 2.7 10*3/uL (ref 1.7–7.7)
Neutrophils Relative %: 64 %
Platelet Count: 148 10*3/uL — ABNORMAL LOW (ref 150–400)
RBC: 4.15 MIL/uL — ABNORMAL LOW (ref 4.22–5.81)
RDW: 13.4 % (ref 11.5–15.5)
WBC Count: 4.2 10*3/uL (ref 4.0–10.5)
nRBC: 0 % (ref 0.0–0.2)

## 2022-08-14 LAB — CMP (CANCER CENTER ONLY)
ALT: 13 U/L (ref 0–44)
AST: 22 U/L (ref 15–41)
Albumin: 4 g/dL (ref 3.5–5.0)
Alkaline Phosphatase: 70 U/L (ref 38–126)
Anion gap: 6 (ref 5–15)
BUN: 13 mg/dL (ref 8–23)
CO2: 27 mmol/L (ref 22–32)
Calcium: 9.2 mg/dL (ref 8.9–10.3)
Chloride: 106 mmol/L (ref 98–111)
Creatinine: 0.83 mg/dL (ref 0.61–1.24)
GFR, Estimated: >60 mL/min (ref >60)
Glucose, Bld: 96 mg/dL (ref 70–99)
Potassium: 3.9 mmol/L (ref 3.5–5.1)
Sodium: 139 mmol/L (ref 135–145)
Total Bilirubin: 0.6 mg/dL (ref 0.3–1.2)
Total Protein: 6.9 g/dL (ref 6.5–8.1)

## 2022-08-14 MED ORDER — AMOXICILLIN-POT CLAVULANATE 875-125 MG PO TABS: 1.0000 | ORAL_TABLET | Freq: Two times a day (BID) | ORAL | 0 refills | Status: DC

## 2022-08-14 MED ORDER — HEPARIN SOD (PORK) LOCK FLUSH 100 UNIT/ML IV SOLN
500.0000 [IU] | Freq: Once | INTRAVENOUS | Status: AC | PRN
  Administered 2022-08-14: 500 [IU]

## 2022-08-14 MED ORDER — SODIUM CHLORIDE 0.9 % IV SOLN
480.0000 mg | Freq: Once | INTRAVENOUS | Status: AC
  Administered 2022-08-14: 480 mg via INTRAVENOUS
  Filled 2022-08-14: qty 48

## 2022-08-14 MED ORDER — SODIUM CHLORIDE 0.9% FLUSH
10.0000 mL | INTRAVENOUS | Status: DC | PRN
  Administered 2022-08-14: 10 mL

## 2022-08-14 MED ORDER — SODIUM CHLORIDE 0.9 % IV SOLN: Freq: Once | INTRAVENOUS | Status: AC

## 2022-08-14 NOTE — Patient Instructions (Signed)
[facility] MEDICAL ONCOLOGY   Discharge Instructions: Thank you for choosing [facility] to provide your oncology and hematology care.   If you have a lab appointment with the Cancer Center, please go directly to the Cancer Center and check in at the registration area.   Wear comfortable clothing and clothing appropriate for easy access to any Portacath or PICC line.   We strive to give you quality time with your provider. You may need to reschedule your appointment if you arrive late (15 or more minutes).  Arriving late affects you and other patients whose appointments are after yours.  Also, if you miss three or more appointments without notifying the office, you may be dismissed from the clinic at the provider's discretion.      For prescription refill requests, have your pharmacy contact our office and allow 72 hours for refills to be completed.    Today you received the following chemotherapy and/or immunotherapy agents: nivolumab      To help prevent nausea and vomiting after your treatment, we encourage you to take your nausea medication as directed.  BELOW ARE SYMPTOMS THAT SHOULD BE REPORTED IMMEDIATELY: *FEVER GREATER THAN 100.4 F (38 C) OR HIGHER *CHILLS OR SWEATING *NAUSEA AND VOMITING THAT IS NOT CONTROLLED WITH YOUR NAUSEA MEDICATION *UNUSUAL SHORTNESS OF BREATH *UNUSUAL BRUISING OR BLEEDING *URINARY PROBLEMS (pain or burning when urinating, or frequent urination) *BOWEL PROBLEMS (unusual diarrhea, constipation, pain near the anus) TENDERNESS IN MOUTH AND THROAT WITH OR WITHOUT PRESENCE OF ULCERS (sore throat, sores in mouth, or a toothache) UNUSUAL RASH, SWELLING OR PAIN  UNUSUAL VAGINAL DISCHARGE OR ITCHING   Items with * indicate a potential emergency and should be followed up as soon as possible or go to the Emergency Department if any problems should occur.  Please show the CHEMOTHERAPY ALERT CARD or IMMUNOTHERAPY ALERT CARD at check-in  to the Emergency Department and triage nurse.  Should you have questions after your visit or need to cancel or reschedule your appointment, please contact [facility] MEDICAL ONCOLOGY  Dept: [phone]  and follow the prompts.  Office hours are 8:00 a.m. to 4:30 p.m. Monday - Friday. Please note that voicemails left after 4:00 p.m. may not be returned until the following business day.  We are closed weekends and major holidays. You have access to a nurse at all times for urgent questions. Please call the main number to the clinic Dept: [phone] and follow the prompts.   For any non-urgent questions, you may also contact your provider using MyChart. We now offer e-Visits for anyone 18 and older to request care online for non-urgent symptoms. For details visit [email or web address].   Also download the MyChart app! Go to the app store, search "MyChart", open the app, select [facility], and log in with your MyChart username and password.  Masks are optional in the cancer centers. If you would like for your care team to wear a mask while they are taking care of you, please let them know. You may have one support person who is at least 66 years old accompany you for your appointments. 

## 2022-08-23 DIAGNOSIS — L814 Other melanin hyperpigmentation: Secondary | ICD-10-CM | POA: Diagnosis not present

## 2022-08-23 DIAGNOSIS — D225 Melanocytic nevi of trunk: Secondary | ICD-10-CM | POA: Diagnosis not present

## 2022-08-23 DIAGNOSIS — Z08 Encounter for follow-up examination after completed treatment for malignant neoplasm: Secondary | ICD-10-CM | POA: Diagnosis not present

## 2022-08-23 DIAGNOSIS — L821 Other seborrheic keratosis: Secondary | ICD-10-CM | POA: Diagnosis not present

## 2022-08-29 ENCOUNTER — Other Ambulatory Visit: Payer: Self-pay

## 2022-09-07 ENCOUNTER — Other Ambulatory Visit: Payer: Self-pay

## 2022-09-10 DIAGNOSIS — E041 Nontoxic single thyroid nodule: Secondary | ICD-10-CM | POA: Insufficient documentation

## 2022-09-10 NOTE — Assessment & Plan Note (Signed)
-  initially presented with dysphasia with solid food and regurgitation. EGD performed by Dr. Alessandra Bevels on 04/19/21 showed a partially obstructing tumor in distal esophagus and a polyp in mid esophagus. Biopsy of both confirmed invasive moderately differentiated adenocarcinoma of the esophagus, Her2 negative. -PET scan 05/16/21 showed: 6.2 cm distal esophageal mass; hypermetabolic upper right paratracheal lymph node. No other distant metastasis.  -He received concurrent chemoRT 05/24/21 - 07/04/21. He started with chemo TC then switched to FOLFOX on 06/20/21. He completed 4 cycles on 08/08/21. Posttreatment PET scan showed excellent response. -s/p esophagogastrectomy on 09/05/21 under Dr. Kipp Brood. -on adjuvant Nivo now

## 2022-09-11 ENCOUNTER — Inpatient Hospital Stay: Payer: BC Managed Care – PPO

## 2022-09-11 ENCOUNTER — Other Ambulatory Visit: Payer: Self-pay

## 2022-09-11 ENCOUNTER — Inpatient Hospital Stay (HOSPITAL_BASED_OUTPATIENT_CLINIC_OR_DEPARTMENT_OTHER): Payer: BC Managed Care – PPO | Admitting: Hematology

## 2022-09-11 ENCOUNTER — Encounter: Payer: Self-pay | Admitting: Hematology

## 2022-09-11 ENCOUNTER — Inpatient Hospital Stay: Payer: BC Managed Care – PPO | Attending: Hematology

## 2022-09-11 VITALS — BP 132/78

## 2022-09-11 VITALS — BP 173/92 | HR 65 | Temp 97.8°F | Resp 18 | Ht 66.0 in | Wt 156.3 lb

## 2022-09-11 DIAGNOSIS — Z5112 Encounter for antineoplastic immunotherapy: Secondary | ICD-10-CM | POA: Diagnosis not present

## 2022-09-11 DIAGNOSIS — Z79899 Other long term (current) drug therapy: Secondary | ICD-10-CM | POA: Diagnosis not present

## 2022-09-11 DIAGNOSIS — C770 Secondary and unspecified malignant neoplasm of lymph nodes of head, face and neck: Secondary | ICD-10-CM | POA: Diagnosis not present

## 2022-09-11 DIAGNOSIS — I10 Essential (primary) hypertension: Secondary | ICD-10-CM

## 2022-09-11 DIAGNOSIS — E041 Nontoxic single thyroid nodule: Secondary | ICD-10-CM

## 2022-09-11 DIAGNOSIS — C158 Malignant neoplasm of overlapping sites of esophagus: Secondary | ICD-10-CM

## 2022-09-11 DIAGNOSIS — E860 Dehydration: Secondary | ICD-10-CM

## 2022-09-11 DIAGNOSIS — Z95828 Presence of other vascular implants and grafts: Secondary | ICD-10-CM

## 2022-09-11 LAB — CBC WITH DIFFERENTIAL (CANCER CENTER ONLY)
Abs Immature Granulocytes: 0.01 10*3/uL (ref 0.00–0.07)
Basophils Absolute: 0 10*3/uL (ref 0.0–0.1)
Basophils Relative: 1 %
Eosinophils Absolute: 0.2 10*3/uL (ref 0.0–0.5)
Eosinophils Relative: 3 %
HCT: 36.8 % — ABNORMAL LOW (ref 39.0–52.0)
Hemoglobin: 12.8 g/dL — ABNORMAL LOW (ref 13.0–17.0)
Immature Granulocytes: 0 %
Lymphocytes Relative: 19 %
Lymphs Abs: 0.9 10*3/uL (ref 0.7–4.0)
MCH: 30.8 pg (ref 26.0–34.0)
MCHC: 34.8 g/dL (ref 30.0–36.0)
MCV: 88.7 fL (ref 80.0–100.0)
Monocytes Absolute: 0.5 10*3/uL (ref 0.1–1.0)
Monocytes Relative: 11 %
Neutro Abs: 3 10*3/uL (ref 1.7–7.7)
Neutrophils Relative %: 66 %
Platelet Count: 147 10*3/uL — ABNORMAL LOW (ref 150–400)
RBC: 4.15 MIL/uL — ABNORMAL LOW (ref 4.22–5.81)
RDW: 13.7 % (ref 11.5–15.5)
WBC Count: 4.5 10*3/uL (ref 4.0–10.5)
nRBC: 0 % (ref 0.0–0.2)

## 2022-09-11 LAB — CMP (CANCER CENTER ONLY)
ALT: 13 U/L (ref 0–44)
AST: 15 U/L (ref 15–41)
Albumin: 4.4 g/dL (ref 3.5–5.0)
Alkaline Phosphatase: 69 U/L (ref 38–126)
Anion gap: 5 (ref 5–15)
BUN: 15 mg/dL (ref 8–23)
CO2: 28 mmol/L (ref 22–32)
Calcium: 9.5 mg/dL (ref 8.9–10.3)
Chloride: 105 mmol/L (ref 98–111)
Creatinine: 0.83 mg/dL (ref 0.61–1.24)
GFR, Estimated: >60 mL/min (ref >60)
Glucose, Bld: 107 mg/dL — ABNORMAL HIGH (ref 70–99)
Potassium: 3.8 mmol/L (ref 3.5–5.1)
Sodium: 138 mmol/L (ref 135–145)
Total Bilirubin: 0.7 mg/dL (ref 0.3–1.2)
Total Protein: 7.2 g/dL (ref 6.5–8.1)

## 2022-09-11 LAB — TSH: TSH: 2.412 u[IU]/mL (ref 0.350–4.500)

## 2022-09-11 MED ORDER — SODIUM CHLORIDE 0.9% FLUSH
10.0000 mL | INTRAVENOUS | Status: DC | PRN
  Administered 2022-09-11: 10 mL

## 2022-09-11 MED ORDER — SODIUM CHLORIDE 0.9 % IV SOLN
480.0000 mg | Freq: Once | INTRAVENOUS | Status: AC
  Administered 2022-09-11: 480 mg via INTRAVENOUS
  Filled 2022-09-11: qty 48

## 2022-09-11 MED ORDER — HEPARIN SOD (PORK) LOCK FLUSH 100 UNIT/ML IV SOLN
500.0000 [IU] | Freq: Once | INTRAVENOUS | Status: AC | PRN
  Administered 2022-09-11: 500 [IU]

## 2022-09-11 MED ORDER — SODIUM CHLORIDE 0.9 % IV SOLN: Freq: Once | INTRAVENOUS | Status: AC

## 2022-09-11 MED ORDER — SODIUM CHLORIDE 0.9% FLUSH: 10.0000 mL | Freq: Once | INTRAVENOUS | Status: DC

## 2022-09-11 NOTE — Patient Instructions (Signed)
[facility] MEDICAL ONCOLOGY   Discharge Instructions: Thank you for choosing [facility] to provide your oncology and hematology care.   If you have a lab appointment with the Cancer Center, please go directly to the Cancer Center and check in at the registration area.   Wear comfortable clothing and clothing appropriate for easy access to any Portacath or PICC line.   We strive to give you quality time with your provider. You may need to reschedule your appointment if you arrive late (15 or more minutes).  Arriving late affects you and other patients whose appointments are after yours.  Also, if you miss three or more appointments without notifying the office, you may be dismissed from the clinic at the provider's discretion.      For prescription refill requests, have your pharmacy contact our office and allow 72 hours for refills to be completed.    Today you received the following chemotherapy and/or immunotherapy agents: nivolumab      To help prevent nausea and vomiting after your treatment, we encourage you to take your nausea medication as directed.  BELOW ARE SYMPTOMS THAT SHOULD BE REPORTED IMMEDIATELY: *FEVER GREATER THAN 100.4 F (38 C) OR HIGHER *CHILLS OR SWEATING *NAUSEA AND VOMITING THAT IS NOT CONTROLLED WITH YOUR NAUSEA MEDICATION *UNUSUAL SHORTNESS OF BREATH *UNUSUAL BRUISING OR BLEEDING *URINARY PROBLEMS (pain or burning when urinating, or frequent urination) *BOWEL PROBLEMS (unusual diarrhea, constipation, pain near the anus) TENDERNESS IN MOUTH AND THROAT WITH OR WITHOUT PRESENCE OF ULCERS (sore throat, sores in mouth, or a toothache) UNUSUAL RASH, SWELLING OR PAIN  UNUSUAL VAGINAL DISCHARGE OR ITCHING   Items with * indicate a potential emergency and should be followed up as soon as possible or go to the Emergency Department if any problems should occur.  Please show the CHEMOTHERAPY ALERT CARD or IMMUNOTHERAPY ALERT CARD at check-in  to the Emergency Department and triage nurse.  Should you have questions after your visit or need to cancel or reschedule your appointment, please contact [facility]  Dept: [phone]  and follow the prompts.  Office hours are 8:00 a.m. to 4:30 p.m. Monday - Friday. Please note that voicemails left after 4:00 p.m. may not be returned until the following business day.  We are closed weekends and major holidays. You have access to a nurse at all times for urgent questions. Please call the main number to the clinic Dept: [phone] and follow the prompts.   For any non-urgent questions, you may also contact your provider using MyChart. We now offer e-Visits for anyone 18 and older to request care online for non-urgent symptoms. For details visit [email or web address].   Also download the MyChart app! Go to the app store, search "MyChart", open the app, select [facility], and log in with your MyChart username and password.  Masks are optional in the cancer centers. If you would like for your care team to wear a mask while they are taking care of you, please let them know. You may have one support person who is at least 66 years old accompany you for your appointments. 

## 2022-09-11 NOTE — Progress Notes (Addendum)
Nathaniel Jennings   [phone] [phone]   Clinic Follow up Note   Patient Care Team: Cyndi Bender, Hershal Coria as PCP - General (Physician Assistant) [name], MD as Consulting Physician (Oncology)  Date of Service:  09/11/2022  CHIEF COMPLAINT: f/u of  esophagus cancer     CURRENT THERAPY:  Lab is fine. Blood counts were good Will proceed with treatment  ASSESSMENT:  Nathaniel Jennings is a 66 y.o. male with   Malignant neoplasm of overlapping sites of esophagus ([place]) -initially presented with dysphasia with solid food and regurgitation. EGD performed by Dr. Alessandra Bevels on 04/19/21 showed a partially obstructing tumor in distal esophagus and a polyp in mid esophagus. Biopsy of both confirmed invasive moderately differentiated adenocarcinoma of the esophagus, Her2 negative. -PET scan 05/16/21 showed: 6.2 cm distal esophageal mass; hypermetabolic upper right paratracheal lymph node. No other distant metastasis.  -He received concurrent chemoRT 05/24/21 - 07/04/21. He started with chemo TC then switched to FOLFOX on 06/20/21. He completed 4 cycles on 08/08/21. Posttreatment PET scan showed excellent response. -s/p esophagogastrectomy on 09/05/21 under Dr. Kipp Brood. -on adjuvant Nivo now , tolerating well, no significant AES. plan to finish after today's treatment if scan shows NED -phone visit next week after CT     PLAN: -Labs reviewed overall stable. -Proceed with nivolumab today as planned, this is his last cycle treatment  -repeat CT chest in 09/15/2022 -Phone visit to discuss    SUMMARY OF ONCOLOGIC HISTORY: Oncology History Overview Note   Cancer Staging  Malignant neoplasm of overlapping sites of esophagus [facility]) Staging form: Esophagus - Adenocarcinoma, AJCC 8th Edition - Clinical stage from 05/13/2021: Stage IVA (cT3, cN2, cM0) - Signed by [name], MD on 05/22/2021    Malignant neoplasm of overlapping sites of esophagus ([place])  04/18/2021 Imaging    ULTRASOUND OF HEAD/NECK SOFT TISSUES  IMPRESSION: No suspicious lymphadenopathy   04/19/2021 Procedure   EGD  Impression: - Esophageal polyp(s) were found. Biopsied. - Partially obstructing, likely malignant esophageal tumor was found in the distal esophagus. - Small hiatal hernia. - A single gastric polyp. Biopsied. - Normal duodenal bulb, first portion of the duodenum and second portion of the duodenum.   04/19/2021 Pathology Results   FINAL MICROSCOPIC DIAGNOSIS: Stomach, Biopsy - FUNDIC GLAND POLYP. Negative for dysplasia. NO Helicobacter pylori organisms seen on H and E stain. Esophagus- Distal, Biopsy - INVASIVE MODERATELY DIFFERENTIATED ADENOCARCINOMA OF THE ESOPHAGUS.  - HER2 Study by Immunostain: Negative (score 1+) Esophagus- Mid, Biopsy - INVASIVE MODERATELY DIFFERENTIATED ADENOCARCINOMA OF THE ESOPHAGUS. - HER2 Study by Immunostain: Negative (score 1+)   05/05/2021 Imaging   CT CAP  IMPRESSION: 1. The patient's known esophageal mass is not well visualized. There is mild irregular wall thickening of the distal esophagus. Correlate with previous clinical evaluation. 2. Indeterminate mildly enlarged high right paratracheal node near the thoracic inlet. No other mediastinal adenopathy. 3. No evidence of metastatic disease in the abdomen or pelvis. 4. Stable small pulmonary nodules bilaterally consistent with benign findings. 5. Cholelithiasis, small renal cysts and Aortic Atherosclerosis (ICD10-I70.0).   05/09/2021 Initial Diagnosis   Malignant neoplasm of overlapping sites of esophagus ([place])   05/11/2021 Procedure   Upper EUS  Impression: - Mucosal nodule found in the esophagus. - Partially obstructing, malignant esophageal tumor was found at the gastroesophageal junction. - At least 3 discrete peritumoral lymph nodes - A mass was found in the gastroesophageal junction. A tissue diagnosis was obtained prior to this exam. This is of  adenocarcinoma. This was  staged T3 N2 Mx by endosonographic criteria. - Unable to traverse GE junction with either EGD scope or radial EUS scope.   05/13/2021 Cancer Staging   Staging form: Esophagus - Adenocarcinoma, AJCC 8th Edition - Clinical stage from 05/13/2021: Stage IVA (cT3, cN2, cM0) - Signed by [name], MD on 05/22/2021   05/16/2021 PET scan   IMPRESSION: 1. Distal esophageal mass measuring about 6.2 cm in length, maximum SUV 13.1. 2. Mildly hypermetabolic and mildly enlarged upper right paratracheal lymph node, 1.3 cm in short axis with maximum SUV 3.6, concerning for malignant involvement. 3. Hypodense 1.1 cm right thyroid nodule has a maximum SUV substantially above that of the background thyroid. A significant minority of such nodules can harbor thyroid cancer. Recommend thyroid US and biopsy (ref: J Am Coll Radiol. 2015 Feb;12(2): 143-50). 4. 3 by 4 mm right lower lobe pulmonary nodule is similar to 05/05/2021, not hypermetabolic but below sensitive PET-CT size thresholds. Surveillance recommended. 5. Other imaging findings of potential clinical significance: Chronic left maxillary sinusitis. Aortic Atherosclerosis (ICD10-I70.0). Cholelithiasis. Prostatomegaly.   05/24/2021 - 06/13/2021 Chemotherapy         05/26/2021 Pathology Results   FINAL MICROSCOPIC DIAGNOSIS:   A. LYMPH NODE, 2R, FINE NEEDLE ASPIRATION:  - Malignant cells consistent with non-small cell carcinoma   COMMENT:  The cells are positive for cytokeratin 20 and CDX-2 (patchy). TTF-1, NapsinA, cytokeratin 5/6, p40, S100, MelanA, and cytokeratin 7 are negative. The patient's history of esophageal adenocarcinoma is noted and CDX-2 positivity is consistent with a gastrointestinal primary. This cytokeratin7/20 profile is usually seen in lower tract tumors, but this pattern can be seen in gastric/esophageal cases.   05/27/2021 - 07/04/2021 Radiation Therapy   Per Dr. Isidore Moos   06/20/2021 - 08/08/2021 Chemotherapy   Patient is on  Treatment Plan : GASTROESOPHAGEAL FOLFOX q14d x 6 cycles     08/04/2021 PET scan   IMPRESSION: 1. Interval development of a new 6 mm short axis right cervical node with hypermetabolism. Finding is somewhat indeterminate given the apparent response of the hypermetabolic right paratracheal lymphadenopathy seen on the previous study. Close follow-up recommended as metastatic disease not excluded. 2. Diffuse hypermetabolic FDG accumulation noted in the esophagus today with more diffuse circumferential esophageal wall thickening on today's study than on the previous exam. Hypermetabolism in the distal esophagus at the level of the hypermetabolic neoplasm previously has decreased from SUV max = 13 to SUV max = 7 today. 3. Stable tiny 3-4 mm right lower lobe and left upper lobe pulmonary nodules. Continued attention on follow-up recommended. 4. Cholelithiasis. 5. Emphysema.  (ICD10-J43.9) 6.  Aortic Atherosclerois (ICD10-170.0)   08/17/2021 Pathology Results   FINAL MICROSCOPIC DIAGNOSIS:  A. LYMPH NODE, RIGHT CERVICAL, FINE NEEDLE ASPIRATION:  - No malignant cells identified  - See comment   COMMENT:  There is scant cellularity and the specimen may not be representative.    09/05/2021 Definitive Surgery   FINAL MICROSCOPIC DIAGNOSIS:   A. ESOPHAGOGASTRECTOMY:  - Invasive poorly differentiated adenocarcinoma.  - Metastatic carcinoma involving 5 of 13 lymph nodes (5/13).  - See oncology table below.   B. LYMPH NODE, LEVEL 8, EXCISION:  - One lymph node, negative for malignancy (0/1).   09/05/2021 Cancer Staging   Staging form: Esophagus - Adenocarcinoma, AJCC 8th Edition - Pathologic stage from 09/05/2021: Stage IIIB (ypT3, pN2, cM0, G3) - Signed by [name], MD on 12/03/2021 Stage prefix: Post-therapy Response to neoadjuvant therapy: Partial response Histologic grading system: 3 grade  system Residual tumor (R): R0 - None   10/10/2021 - 06/19/2022 Chemotherapy   Patient is on  Treatment Plan : GASTROESOPHAGEAL Nivolumab q14d x 8 cycles / Nivolumab q28d     10/10/2021 -  Chemotherapy   Patient is on Treatment Plan : GASTROESOPHAGEAL Nivolumab (240) q14d x 8 cycles / Nivolumab (480) q28d     12/30/2021 Imaging   EXAM: CT CHEST, ABDOMEN, AND PELVIS WITH CONTRAST  IMPRESSION: Status post esophagectomy with gastric pull-through procedure. No evidence of recurrent or metastatic carcinoma within the chest, abdomen, or pelvis.   Cholelithiasis. No radiographic evidence of cholecystitis.   Stable enlarged prostate.   Aortic Atherosclerosis (ICD10-I70.0).      INTERVAL HISTORY:  ABHIRAM CRIADO is here for a follow up of esophageal cancer. He was last seen by NP Lacie on 08/14/2022. He presents to the clinic accompanied by spouse.   All other systems were reviewed with the patient and are negative.  MEDICAL HISTORY:  Past Medical History:  Diagnosis Date   Arthritis    BPH (benign prostatic hyperplasia)    Dyslipidemia    Elevated PSA    Esophageal cancer ([place])    Full dentures    GERD (gastroesophageal reflux disease)    History of melanoma excision    07/ 2011  s/p  wide excision left back--- per pathology-- negative maligant --  mild melanocytic atypia   History of palpitations    cardiologist --  dr Shelva Majestic (last note in epic 2014   Hypertension    Sleep apnea     SURGICAL HISTORY: Past Surgical History:  Procedure Laterality Date   COLONOSCOPY  10/24/2007   ESOPHAGOGASTRODUODENOSCOPY N/A 09/05/2021   Procedure: ESOPHAGOGASTRODUODENOSCOPY (EGD);  Surgeon: Lajuana Matte, MD;  Location[facility] OR;  Service: Thoracic;  Laterality: N/A;   ESOPHAGOGASTRODUODENOSCOPY (EGD) WITH PROPOFOL N/A 05/11/2021   Procedure: ESOPHAGOGASTRODUODENOSCOPY (EGD) WITH PROPOFOL;  Surgeon: Arta Silence, MD;  Location: [facility] ENDOSCOPY;  Service: Endoscopy;  Laterality: N/A;   EUS N/A 05/11/2021   Procedure: ESOPHAGEAL ENDOSCOPIC ULTRASOUND (EUS) RADIAL;   Surgeon: Arta Silence, MD;  Location: [facility]COPY;  Service: Endoscopy;  Laterality: N/A;   EYE SURGERY     Lasik surgery on both eyes, but still needs glasses   FINE NEEDLE ASPIRATION  05/26/2021   Procedure: FINE NEEDLE ASPIRATION (FNA) LINEAR;  Surgeon: Garner Nash, DO;  Location: [facility];  Service: Pulmonary;;   INTERCOSTAL NERVE BLOCK  09/05/2021   Procedure: INTERCOSTAL NERVE BLOCK;  Surgeon: Lajuana Matte, MD;  Location: [facility];  Service: Thoracic;;   IR IMAGING GUIDED PORT INSERTION  06/30/2021   JEJUNOSTOMY  09/05/2021   Procedure: Shanon Rosser;  Surgeon: Lajuana Matte, MD;  Location: [facility];  Service: Thoracic;;   PROSTATE BIOPSY N/A 07/02/2015   Procedure: BIOPSY TRANSRECTAL ULTRASONIC PROSTATE (TUBP);  Surgeon: Lowella Bandy, MD;  Location[facility];  Service: Urology;  Laterality: N/A;   PROSTATE BIOPSY N/A 11/22/2015   Procedure: BIOPSY TRANSRECTAL ULTRASONIC PROSTATE (TUBP);  Surgeon: Cleon Gustin, MD;  Location: [facility];  Service: Urology;  Laterality: N/A;   TRANSTHORACIC ECHOCARDIOGRAM  11/19/2012   normal echo/  trivial TR   VIDEO BRONCHOSCOPY WITH ENDOBRONCHIAL ULTRASOUND N/A 05/26/2021   Procedure: VIDEO BRONCHOSCOPY WITH ENDOBRONCHIAL ULTRASOUND;  Surgeon: Garner Nash, DO;  Location: [facility];  Service: Pulmonary;  Laterality: N/A;   WIDE EXCISION MELANOMA LEFT BACK  05/02/2010    I have reviewed the social history and family  history with the patient and they are unchanged from previous note.  ALLERGIES:  is allergic to bee venom.  MEDICATIONS:  Current Outpatient Medications  Medication Sig Dispense Refill   amoxicillin-clavulanate (AUGMENTIN) 875-125 MG tablet Take 1 tablet by mouth 2 (two) times daily. 14 tablet 0   aspirin EC 81 MG tablet Take 81 mg by mouth in the morning.     ketoconazole (NIZORAL) 2 % cream APPLY 1 APPLICATION TOPICALLY DAILY 15 g 0   metoprolol succinate (TOPROL-XL) 50 MG  24 hr tablet Take 50 mg by mouth daily. Take with or immediately following a meal.     Multiple Vitamin (MULTIVITAMIN WITH MINERALS) TABS tablet Take 1 tablet by mouth daily.     omeprazole (PRILOSEC) 40 MG capsule Take 40 mg by mouth 2 (two) times daily.     Skin Protectants, Misc. (EUCERIN) cream Apply 1 application. topically daily as needed for dry skin.     sucralfate (CARAFATE) 1 g tablet Take 1 g by mouth 2 (two) times daily.     No current facility-administered medications for this visit.   Facility-Administered Medications Ordered in Other Visits  Medication Dose Route Frequency Provider Last Rate Last Admin   sodium chloride flush (NS) 0.9 % injection 10 mL  10 mL Intracatheter PRN [name], MD   10 mL at 09/11/22 1241    PHYSICAL EXAMINATION: ECOG PERFORMANCE STATUS: 0 - Asymptomatic  Vitals:   09/11/22 1018  BP: (!) 173/92  Pulse: 65  Resp: 18  Temp: 97.8 F (36.6 C)  SpO2: 100%   Wt Readings from Last 3 Encounters:  09/11/22 156 lb 4.8 oz (70.9 kg)  08/14/22 152 lb 3.2 oz (69 kg)  07/17/22 152 lb 8 oz (69.2 kg)     GENERAL:alert, no distress and comfortable SKIN: skin color normal, no rashes or significant lesions EYES: normal, Conjunctiva are pink and non-injected, sclera clear  NEURO: alert & oriented x 3 with fluent speech  LABORATORY DATA:  I have reviewed the data as listed    Latest Ref Rng & Units 09/11/2022    9:39 AM 08/14/2022    8:50 AM 07/17/2022    8:51 AM  CBC  WBC 4.0 - 10.5 K/uL 4.5  4.2  5.2   Hemoglobin 13.0 - 17.0 g/dL 12.8  12.9  13.0   Hematocrit 39.0 - 52.0 % 36.8  37.2  37.2   Platelets 150 - 400 K/uL 147  148  143         Latest Ref Rng & Units 09/11/2022    9:39 AM 08/14/2022    8:50 AM 07/17/2022    8:51 AM  CMP  Glucose 70 - 99 mg/dL 107  96  84   BUN 8 - 23 mg/dL _0 Creatinine 0.61 - 1.24 mg/dL 0.83  0.83  0.84   Sodium 135 - 145 mmol/L 138  139  138   Potassium 3.5 - 5.1 mmol/L 3.8  3.9  3.8   Chloride 98  - 111 mmol/L 105  106  106   CO2 22 - 32 mmol/L _1 Calcium 8.9 - 10.3 mg/dL 9.5  9.2  9.0   Total Protein 6.5 - 8.1 g/dL 7.2  6.9  7.0   Total Bilirubin 0.3 - 1.2 mg/dL 0.7  0.6  0.4   Alkaline Phos 38 - 126 U/L 69  70  79   AST 15 - 41 U/L 15  22  16   ALT 0 - 44 U/L _0 RADIOGRAPHIC STUDIES: I have personally reviewed the radiological images as listed and agreed with the findings in the report. No results found.    No orders of the defined types were placed in this encounter.  All questions were answered. The patient knows to call the clinic with any problems, questions or concerns. No barriers to learning was detected. The total time spent in the appointment was 30 minutes.     [name], MD 09/11/2022   I, Chell.[email or web address], am acting as scribe for [name], MD.   I have reviewed the above documentation for accuracy and completeness, and I agree with the above.

## 2022-09-12 LAB — T4: T4, Total: 7.5 ug/dL (ref 4.5–12.0)

## 2022-09-15 ENCOUNTER — Ambulatory Visit (HOSPITAL_COMMUNITY)
Admission: RE | Admit: 2022-09-15 | Discharge: 2022-09-15 | Disposition: A | Discharge status: Home / Self Care | Payer: BC Managed Care – PPO | Source: Ambulatory Visit | Attending: Nurse Practitioner | Admitting: Nurse Practitioner

## 2022-09-15 DIAGNOSIS — R918 Other nonspecific abnormal finding of lung field: Secondary | ICD-10-CM | POA: Diagnosis not present

## 2022-09-15 DIAGNOSIS — C158 Malignant neoplasm of overlapping sites of esophagus: Secondary | ICD-10-CM | POA: Insufficient documentation

## 2022-09-15 DIAGNOSIS — C159 Malignant neoplasm of esophagus, unspecified: Secondary | ICD-10-CM | POA: Diagnosis not present

## 2022-09-18 ENCOUNTER — Other Ambulatory Visit (HOSPITAL_COMMUNITY): Payer: BC Managed Care – PPO

## 2022-09-19 ENCOUNTER — Inpatient Hospital Stay (HOSPITAL_BASED_OUTPATIENT_CLINIC_OR_DEPARTMENT_OTHER): Payer: BC Managed Care – PPO | Admitting: Hematology

## 2022-09-19 DIAGNOSIS — I10 Essential (primary) hypertension: Secondary | ICD-10-CM

## 2022-09-19 DIAGNOSIS — C158 Malignant neoplasm of overlapping sites of esophagus: Secondary | ICD-10-CM

## 2022-09-19 NOTE — Progress Notes (Unsigned)
[place]   [phone] [phone]   Clinic Follow up Note   Patient Care Team: [name] as PCP - General (Physician Assistant) [name], MD as Consulting Physician (Oncology)  Date of Service:  09/19/2022  I connected with Louanne Skye on 09/19/2022 at 11:20 AM EST by telephone visit and verified that I am speaking with the correct person using two identifiers.  I discussed the limitations, risks, security and privacy concerns of performing an evaluation and management service by telephone and the availability of in person appointments. I also discussed with the patient that there may be a patient responsible charge related to this service. The patient expressed understanding and agreed to proceed.   Other persons participating in the visit and their role in the encounter:  No other person participating in visit.  Patient's location:  Home Provider's location:  my office  CHIEF COMPLAINT: f/u of esophagus cancer    CURRENT THERAPY:  Adjuvant nivolumab, starting 10/10/21, currently q4weeks    ASSESSMENT & PLAN:  TRACER GUTRIDGE is a 66 y.o. male with   Malignant neoplasm of overlapping sites of esophagus ([place]) -initially presented with dysphasia with solid food and regurgitation. EGD performed by Dr. Alessandra Bevels on 04/19/21 showed a partially obstructing tumor in distal esophagus and a polyp in mid esophagus. Biopsy of both confirmed invasive moderately differentiated adenocarcinoma of the esophagus, Her2 negative. -PET scan 05/16/21 showed: 6.2 cm distal esophageal mass; hypermetabolic upper right paratracheal lymph node. No other distant metastasis.  -He received concurrent chemoRT 05/24/21 - 07/04/21. He started with chemo TC then switched to FOLFOX on 06/20/21. He completed 4 cycles on 08/08/21. Posttreatment PET scan showed excellent response. -s/p esophagogastrectomy on 09/05/21 under Dr. Kipp Brood. -He has completed one year  adjuvant Nivo, tolerated very well  -I personally reviewed his recent restaging CT chest, which showed resolved infiltrates in the left lower lobe of lung, no evidence of cancer recurrence.  Patient is quite pleased with the results. -We discussed cancer surveillance, he is still at high risk for recurrence, I recommend continue lab and follow-up every 3 months, repeat CT scan every 6 months in the next 2 years then yearly for additional 1-2 years .         PLAN: -discuss Ct Scan findings which showed NED  -Port flush every 6 weeks -Lab and follow-up in 3 months, plan to repeat CT scan in 6 months   No problem-specific Assessment & Plan notes found for this encounter.    SUMMARY OF ONCOLOGIC HISTORY: Oncology History Overview Note   Cancer Staging  Malignant neoplasm of overlapping sites of esophagus [facility]) Staging form: Esophagus - Adenocarcinoma, AJCC 8th Edition - Clinical stage from 05/13/2021: Stage IVA (cT3, cN2, cM0) - Signed by [name], MD on 05/22/2021    Malignant neoplasm of overlapping sites of esophagus ([place])  04/18/2021 Imaging   ULTRASOUND OF HEAD/NECK SOFT TISSUES  IMPRESSION: No suspicious lymphadenopathy   04/19/2021 Procedure   EGD  Impression: - Esophageal polyp(s) were found. Biopsied. - Partially obstructing, likely malignant esophageal tumor was found in the distal esophagus. - Small hiatal hernia. - A single gastric polyp. Biopsied. - Normal duodenal bulb, first portion of the duodenum and second portion of the duodenum.   04/19/2021 Pathology Results   FINAL MICROSCOPIC DIAGNOSIS: Stomach, Biopsy - FUNDIC GLAND POLYP. Negative for dysplasia. NO Helicobacter pylori organisms seen on H and E stain. Esophagus- Distal, Biopsy - INVASIVE MODERATELY DIFFERENTIATED ADENOCARCINOMA OF THE ESOPHAGUS.  -  HER2 Study by Immunostain: Negative (score 1+) Esophagus- Mid, Biopsy - INVASIVE MODERATELY DIFFERENTIATED ADENOCARCINOMA OF THE ESOPHAGUS. - HER2 Study  by Immunostain: Negative (score 1+)   05/05/2021 Imaging   CT CAP  IMPRESSION: 1. The patient's known esophageal mass is not well visualized. There is mild irregular wall thickening of the distal esophagus. Correlate with previous clinical evaluation. 2. Indeterminate mildly enlarged high right paratracheal node near the thoracic inlet. No other mediastinal adenopathy. 3. No evidence of metastatic disease in the abdomen or pelvis. 4. Stable small pulmonary nodules bilaterally consistent with benign findings. 5. Cholelithiasis, small renal cysts and Aortic Atherosclerosis (ICD10-I70.0).   05/09/2021 Initial Diagnosis   Malignant neoplasm of overlapping sites of esophagus ([place])   05/11/2021 Procedure   Upper EUS  Impression: - Mucosal nodule found in the esophagus. - Partially obstructing, malignant esophageal tumor was found at the gastroesophageal junction. - At least 3 discrete peritumoral lymph nodes - A mass was found in the gastroesophageal junction. A tissue diagnosis was obtained prior to this exam. This is of adenocarcinoma. This was staged T3 N2 Mx by endosonographic criteria. - Unable to traverse GE junction with either EGD scope or radial EUS scope.   05/13/2021 Cancer Staging   Staging form: Esophagus - Adenocarcinoma, AJCC 8th Edition - Clinical stage from 05/13/2021: Stage IVA (cT3, cN2, cM0) - Signed by [name], MD on 05/22/2021   05/16/2021 PET scan   IMPRESSION: 1. Distal esophageal mass measuring about 6.2 cm in length, maximum SUV 13.1. 2. Mildly hypermetabolic and mildly enlarged upper right paratracheal lymph node, 1.3 cm in short axis with maximum SUV 3.6, concerning for malignant involvement. 3. Hypodense 1.1 cm right thyroid nodule has a maximum SUV substantially above that of the background thyroid. A significant minority of such nodules can harbor thyroid cancer. Recommend thyroid US and biopsy (ref: J Am Coll Radiol. 2015 Feb;12(2): 143-50). 4. 3 by 4 mm  right lower lobe pulmonary nodule is similar to 05/05/2021, not hypermetabolic but below sensitive PET-CT size thresholds. Surveillance recommended. 5. Other imaging findings of potential clinical significance: Chronic left maxillary sinusitis. Aortic Atherosclerosis (ICD10-I70.0). Cholelithiasis. Prostatomegaly.   05/24/2021 - 06/13/2021 Chemotherapy         05/26/2021 Pathology Results   FINAL MICROSCOPIC DIAGNOSIS:   A. LYMPH NODE, 2R, FINE NEEDLE ASPIRATION:  - Malignant cells consistent with non-small cell carcinoma   COMMENT:  The cells are positive for cytokeratin 20 and CDX-2 (patchy). TTF-1, NapsinA, cytokeratin 5/6, p40, S100, MelanA, and cytokeratin 7 are negative. The patient's history of esophageal adenocarcinoma is noted and CDX-2 positivity is consistent with a gastrointestinal primary. This cytokeratin7/20 profile is usually seen in lower tract tumors, but this pattern can be seen in gastric/esophageal cases.   05/27/2021 - 07/04/2021 Radiation Therapy   Per Dr. Isidore Moos   06/20/2021 - 08/08/2021 Chemotherapy   Patient is on Treatment Plan : GASTROESOPHAGEAL FOLFOX q14d x 6 cycles     08/04/2021 PET scan   IMPRESSION: 1. Interval development of a new 6 mm short axis right cervical node with hypermetabolism. Finding is somewhat indeterminate given the apparent response of the hypermetabolic right paratracheal lymphadenopathy seen on the previous study. Close follow-up recommended as metastatic disease not excluded. 2. Diffuse hypermetabolic FDG accumulation noted in the esophagus today with more diffuse circumferential esophageal wall thickening on today's study than on the previous exam. Hypermetabolism in the distal esophagus at the level of the hypermetabolic neoplasm previously has decreased from SUV max = 13 to SUV max =  7 today. 3. Stable tiny 3-4 mm right lower lobe and left upper lobe pulmonary nodules. Continued attention on follow-up recommended. 4. Cholelithiasis. 5.  Emphysema.  (ICD10-J43.9) 6.  Aortic Atherosclerois (ICD10-170.0)   08/17/2021 Pathology Results   FINAL MICROSCOPIC DIAGNOSIS:  A. LYMPH NODE, RIGHT CERVICAL, FINE NEEDLE ASPIRATION:  - No malignant cells identified  - See comment   COMMENT:  There is scant cellularity and the specimen may not be representative.    09/05/2021 Definitive Surgery   FINAL MICROSCOPIC DIAGNOSIS:   A. ESOPHAGOGASTRECTOMY:  - Invasive poorly differentiated adenocarcinoma.  - Metastatic carcinoma involving 5 of 13 lymph nodes (5/13).  - See oncology table below.   B. LYMPH NODE, LEVEL 8, EXCISION:  - One lymph node, negative for malignancy (0/1).   09/05/2021 Cancer Staging   Staging form: Esophagus - Adenocarcinoma, AJCC 8th Edition - Pathologic stage from 09/05/2021: Stage IIIB (ypT3, pN2, cM0, G3) - Signed by [name], MD on 12/03/2021 Stage prefix: Post-therapy Response to neoadjuvant therapy: Partial response Histologic grading system: 3 grade system Residual tumor (R): R0 - None   10/10/2021 - 06/19/2022 Chemotherapy   Patient is on Treatment Plan : GASTROESOPHAGEAL Nivolumab q14d x 8 cycles / Nivolumab q28d     10/10/2021 -  Chemotherapy   Patient is on Treatment Plan : GASTROESOPHAGEAL Nivolumab (240) q14d x 8 cycles / Nivolumab (480) q28d     12/30/2021 Imaging   EXAM: CT CHEST, ABDOMEN, AND PELVIS WITH CONTRAST  IMPRESSION: Status post esophagectomy with gastric pull-through procedure. No evidence of recurrent or metastatic carcinoma within the chest, abdomen, or pelvis.   Cholelithiasis. No radiographic evidence of cholecystitis.   Stable enlarged prostate.   Aortic Atherosclerosis (ICD10-I70.0).      INTERVAL HISTORY:  Nathaniel Jennings was contacted for a follow up of esophagus cancer . He was last seen by me on 09/11/2022 . Pt state he had some cough no phlegm. Pt asked about when is his next fusion.Pt states experience fatigue last tx.   All other systems were reviewed  with the patient and are negative.  MEDICAL HISTORY:  Past Medical History:  Diagnosis Date   Arthritis    BPH (benign prostatic hyperplasia)    Dyslipidemia    Elevated PSA    Esophageal cancer ([place])    Full dentures    GERD (gastroesophageal reflux disease)    History of melanoma excision    07/ 2011  s/p  wide excision left back--- per pathology-- negative maligant --  mild melanocytic atypia   History of palpitations    cardiologist --  dr Shelva Majestic (last note in epic 2014   Hypertension    Sleep apnea     SURGICAL HISTORY: Past Surgical History:  Procedure Laterality Date   COLONOSCOPY  10/24/2007   ESOPHAGOGASTRODUODENOSCOPY N/A 09/05/2021   Procedure: ESOPHAGOGASTRODUODENOSCOPY (EGD);  Surgeon: Lajuana Matte, MD;  Location: [facility];  Service: Thoracic;  Laterality: N/A;   ESOPHAGOGASTRODUODENOSCOPY (EGD) WITH PROPOFOL N/A 05/11/2021   Procedure: ESOPHAGOGASTRODUODENOSCOPY (EGD) WITH PROPOFOL;  Surgeon: Arta Silence, MD;  Location: [facility] ENDOSCOPY;  Service: Endoscopy;  Laterality: N/A;   EUS N/A 05/11/2021   Procedure: ESOPHAGEAL ENDOSCOPIC ULTRASOUND (EUS) RADIAL;  Surgeon: Arta Silence, MD;  Location: [facility] ENDOSCOPY;  Service: Endoscopy;  Laterality: N/A;   EYE SURGERY     Lasik surgery on both eyes, but still needs glasses   FINE NEEDLE ASPIRATION  05/26/2021   Procedure: FINE NEEDLE ASPIRATION (FNA) LINEAR;  Surgeon: Garner Nash, DO;  Location: [place] ENDOSCOPY;  Service: Pulmonary;;   INTERCOSTAL NERVE BLOCK  09/05/2021   Procedure: INTERCOSTAL NERVE BLOCK;  Surgeon: Lajuana Matte, MD;  Location: [place];  Service: Thoracic;;   IR IMAGING GUIDED PORT INSERTION  06/30/2021   JEJUNOSTOMY  09/05/2021   Procedure: Shanon Rosser;  Surgeon: Lajuana Matte, MD;  Location: [place];  Service: Thoracic;;   PROSTATE BIOPSY N/A 07/02/2015   Procedure: BIOPSY TRANSRECTAL ULTRASONIC PROSTATE (TUBP);  Surgeon: Lowella Bandy, MD;  Location: [facility];   Service: Urology;  Laterality: N/A;   PROSTATE BIOPSY N/A 11/22/2015   Procedure: BIOPSY TRANSRECTAL ULTRASONIC PROSTATE (TUBP);  Surgeon: Cleon Gustin, MD;  Location: [facility];  Service: Urology;  Laterality: N/A;   TRANSTHORACIC ECHOCARDIOGRAM  11/19/2012   normal echo/  trivial TR   VIDEO BRONCHOSCOPY WITH ENDOBRONCHIAL ULTRASOUND N/A 05/26/2021   Procedure: VIDEO BRONCHOSCOPY WITH ENDOBRONCHIAL ULTRASOUND;  Surgeon: Garner Nash, DO;  Location: [place];  Service: Pulmonary;  Laterality: N/A;   WIDE EXCISION MELANOMA LEFT BACK  05/02/2010    I have reviewed the social history and family history with the patient and they are unchanged from previous note.  ALLERGIES:  is allergic to bee venom.  MEDICATIONS:  Current Outpatient Medications  Medication Sig Dispense Refill   amoxicillin-clavulanate (AUGMENTIN) 875-125 MG tablet Take 1 tablet by mouth 2 (two) times daily. 14 tablet 0   aspirin EC 81 MG tablet Take 81 mg by mouth in the morning.     ketoconazole (NIZORAL) 2 % cream APPLY 1 APPLICATION TOPICALLY DAILY 15 g 0   metoprolol succinate (TOPROL-XL) 50 MG 24 hr tablet Take 50 mg by mouth daily. Take with or immediately following a meal.     Multiple Vitamin (MULTIVITAMIN WITH MINERALS) TABS tablet Take 1 tablet by mouth daily.     omeprazole (PRILOSEC) 40 MG capsule Take 40 mg by mouth 2 (two) times daily.     Skin Protectants, Misc. (EUCERIN) cream Apply 1 application. topically daily as needed for dry skin.     sucralfate (CARAFATE) 1 g tablet Take 1 g by mouth 2 (two) times daily.     No current facility-administered medications for this visit.    PHYSICAL EXAMINATION: ECOG PERFORMANCE STATUS: 0 - Asymptomatic  There were no vitals filed for this visit. Wt Readings from Last 3 Encounters:  09/11/22 156 lb 4.8 oz (70.9 kg)  08/14/22 152 lb 3.2 oz (69 kg)  07/17/22 152 lb 8 oz (69.2 kg)     No vitals taken today, Exam not performed  today  LABORATORY DATA:  I have reviewed the data as listed    Latest Ref Rng & Units 09/11/2022    9:39 AM 08/14/2022    8:50 AM 07/17/2022    8:51 AM  CBC  WBC 4.0 - 10.5 K/uL 4.5  4.2  5.2   Hemoglobin 13.0 - 17.0 g/dL 12.8  12.9  13.0   Hematocrit 39.0 - 52.0 % 36.8  37.2  37.2   Platelets 150 - 400 K/uL 147  148  143         Latest Ref Rng & Units 09/11/2022    9:39 AM 08/14/2022    8:50 AM 07/17/2022    8:51 AM  CMP  Glucose 70 - 99 mg/dL 107  96  84   BUN 8 - 23 mg/dL _0 Creatinine 0.61 - 1.24 mg/dL 0.83  0.83  0.84   Sodium  135 - 145 mmol/L 138  139  138   Potassium 3.5 - 5.1 mmol/L 3.8  3.9  3.8   Chloride 98 - 111 mmol/L 105  106  106   CO2 22 - 32 mmol/L _0 Calcium 8.9 - 10.3 mg/dL 9.5  9.2  9.0   Total Protein 6.5 - 8.1 g/dL 7.2  6.9  7.0   Total Bilirubin 0.3 - 1.2 mg/dL 0.7  0.6  0.4   Alkaline Phos 38 - 126 U/L 69  70  79   AST 15 - 41 U/L _1 ALT 0 - 44 U/L _2 RADIOGRAPHIC STUDIES: I have personally reviewed the radiological images as listed and agreed with the findings in the report. No results found.    No orders of the defined types were placed in this encounter.  All questions were answered. The patient knows to call the clinic with any problems, questions or concerns. No barriers to learning was detected. The total time spent in the appointment was 22 minutes.     [name], MD 09/19/2022   I, Wilburn Mylar, am acting as scribe for [name], MD.   I have reviewed the above documentation for accuracy and completeness, and I agree with the above.

## 2022-09-20 ENCOUNTER — Encounter: Payer: Self-pay | Admitting: Hematology

## 2022-09-20 ENCOUNTER — Telehealth: Payer: Self-pay | Admitting: Hematology

## 2022-09-20 NOTE — Telephone Encounter (Signed)
Called patient and notified of upcoming appointments.

## 2022-09-21 ENCOUNTER — Other Ambulatory Visit: Payer: Self-pay

## 2022-09-25 ENCOUNTER — Other Ambulatory Visit: Payer: Self-pay

## 2022-09-29 DIAGNOSIS — N401 Enlarged prostate with lower urinary tract symptoms: Secondary | ICD-10-CM | POA: Diagnosis not present

## 2022-09-29 DIAGNOSIS — R350 Frequency of micturition: Secondary | ICD-10-CM | POA: Diagnosis not present

## 2022-10-06 DIAGNOSIS — R972 Elevated prostate specific antigen [PSA]: Secondary | ICD-10-CM | POA: Diagnosis not present

## 2022-10-06 DIAGNOSIS — N401 Enlarged prostate with lower urinary tract symptoms: Secondary | ICD-10-CM | POA: Diagnosis not present

## 2022-10-06 DIAGNOSIS — R350 Frequency of micturition: Secondary | ICD-10-CM | POA: Diagnosis not present

## 2022-10-25 ENCOUNTER — Encounter: Payer: Self-pay | Admitting: Hematology

## 2022-11-01 ENCOUNTER — Encounter: Payer: Self-pay | Admitting: Hematology

## 2022-11-01 ENCOUNTER — Inpatient Hospital Stay: Payer: Commercial Managed Care - PPO | Attending: Hematology

## 2022-11-01 ENCOUNTER — Other Ambulatory Visit: Payer: Self-pay

## 2022-11-01 DIAGNOSIS — E860 Dehydration: Secondary | ICD-10-CM

## 2022-11-01 DIAGNOSIS — C158 Malignant neoplasm of overlapping sites of esophagus: Secondary | ICD-10-CM | POA: Insufficient documentation

## 2022-11-01 DIAGNOSIS — Z95828 Presence of other vascular implants and grafts: Secondary | ICD-10-CM

## 2022-11-01 MED ORDER — HEPARIN SOD (PORK) LOCK FLUSH 100 UNIT/ML IV SOLN
500.0000 [IU] | Freq: Once | INTRAVENOUS | Status: AC
  Administered 2022-11-01: 500 [IU]

## 2022-11-01 MED ORDER — SODIUM CHLORIDE 0.9% FLUSH
10.0000 mL | Freq: Once | INTRAVENOUS | Status: AC
  Administered 2022-11-01: 10 mL

## 2022-11-29 ENCOUNTER — Other Ambulatory Visit: Payer: Self-pay | Admitting: Urology

## 2022-11-29 DIAGNOSIS — R972 Elevated prostate specific antigen [PSA]: Secondary | ICD-10-CM

## 2022-12-05 ENCOUNTER — Other Ambulatory Visit: Payer: Self-pay

## 2022-12-21 ENCOUNTER — Inpatient Hospital Stay: Payer: Commercial Managed Care - PPO | Attending: Hematology

## 2022-12-21 ENCOUNTER — Inpatient Hospital Stay (HOSPITAL_BASED_OUTPATIENT_CLINIC_OR_DEPARTMENT_OTHER): Payer: Commercial Managed Care - PPO | Admitting: Hematology

## 2022-12-21 ENCOUNTER — Encounter: Payer: Self-pay | Admitting: Hematology

## 2022-12-21 VITALS — BP 163/94 | HR 64 | Temp 98.1°F | Resp 18 | Ht 66.0 in | Wt 164.3 lb

## 2022-12-21 DIAGNOSIS — I10 Essential (primary) hypertension: Secondary | ICD-10-CM | POA: Insufficient documentation

## 2022-12-21 DIAGNOSIS — N400 Enlarged prostate without lower urinary tract symptoms: Secondary | ICD-10-CM | POA: Insufficient documentation

## 2022-12-21 DIAGNOSIS — Z95828 Presence of other vascular implants and grafts: Secondary | ICD-10-CM

## 2022-12-21 DIAGNOSIS — C158 Malignant neoplasm of overlapping sites of esophagus: Secondary | ICD-10-CM | POA: Insufficient documentation

## 2022-12-21 DIAGNOSIS — I700 Atherosclerosis of aorta: Secondary | ICD-10-CM | POA: Insufficient documentation

## 2022-12-21 DIAGNOSIS — E860 Dehydration: Secondary | ICD-10-CM

## 2022-12-21 LAB — CBC WITH DIFFERENTIAL (CANCER CENTER ONLY)
Abs Immature Granulocytes: 0.02 10*3/uL (ref 0.00–0.07)
Basophils Absolute: 0.1 10*3/uL (ref 0.0–0.1)
Basophils Relative: 2 %
Eosinophils Absolute: 0.2 10*3/uL (ref 0.0–0.5)
Eosinophils Relative: 4 %
HCT: 35.9 % — ABNORMAL LOW (ref 39.0–52.0)
Hemoglobin: 12.6 g/dL — ABNORMAL LOW (ref 13.0–17.0)
Immature Granulocytes: 1 %
Lymphocytes Relative: 23 %
Lymphs Abs: 1 10*3/uL (ref 0.7–4.0)
MCH: 31 pg (ref 26.0–34.0)
MCHC: 35.1 g/dL (ref 30.0–36.0)
MCV: 88.2 fL (ref 80.0–100.0)
Monocytes Absolute: 0.5 10*3/uL (ref 0.1–1.0)
Monocytes Relative: 10 %
Neutro Abs: 2.6 10*3/uL (ref 1.7–7.7)
Neutrophils Relative %: 60 %
Platelet Count: 156 10*3/uL (ref 150–400)
RBC: 4.07 MIL/uL — ABNORMAL LOW (ref 4.22–5.81)
RDW: 14 % (ref 11.5–15.5)
WBC Count: 4.3 10*3/uL (ref 4.0–10.5)
nRBC: 0 % (ref 0.0–0.2)

## 2022-12-21 LAB — CMP (CANCER CENTER ONLY)
ALT: 16 U/L (ref 0–44)
AST: 16 U/L (ref 15–41)
Albumin: 4.3 g/dL (ref 3.5–5.0)
Alkaline Phosphatase: 68 U/L (ref 38–126)
Anion gap: 6 (ref 5–15)
BUN: 12 mg/dL (ref 8–23)
CO2: 28 mmol/L (ref 22–32)
Calcium: 8.5 mg/dL — ABNORMAL LOW (ref 8.9–10.3)
Chloride: 105 mmol/L (ref 98–111)
Creatinine: 0.81 mg/dL (ref 0.61–1.24)
GFR, Estimated: >60 mL/min (ref >60)
Glucose, Bld: 88 mg/dL (ref 70–99)
Potassium: 3.6 mmol/L (ref 3.5–5.1)
Sodium: 139 mmol/L (ref 135–145)
Total Bilirubin: 0.5 mg/dL (ref 0.3–1.2)
Total Protein: 6.5 g/dL (ref 6.5–8.1)

## 2022-12-21 MED ORDER — HEPARIN SOD (PORK) LOCK FLUSH 100 UNIT/ML IV SOLN
500.0000 [IU] | Freq: Once | INTRAVENOUS | Status: AC
  Administered 2022-12-21: 500 [IU]

## 2022-12-21 MED ORDER — SODIUM CHLORIDE 0.9% FLUSH
10.0000 mL | Freq: Once | INTRAVENOUS | Status: AC
  Administered 2022-12-21: 10 mL

## 2022-12-21 NOTE — Assessment & Plan Note (Signed)
yp(T3, N2) with upper mediastinal adenopathy  -diagnosed in 03/2021 -s/p concurrent chemoRT 05/24/21 - 07/04/21. He started with chemo TC then switched to FOLFOX on 06/20/21. He completed 4 cycles on 08/08/21. Posttreatment PET scan showed excellent response. -s/p esophagogastrectomy on 09/05/21 under Dr. Kipp Brood. -He has completed one year adjuvant Nivo, tolerated very well

## 2022-12-21 NOTE — Progress Notes (Signed)
Nathaniel Jennings   [phone] [phone]   Clinic Follow up Note   Patient Care Team: [name] as PCP - General (Physician Assistant) [name], MD as Consulting Physician (Oncology)  Date of Service:  12/21/2022  CHIEF COMPLAINT: f/u of esophagus cancer     CURRENT THERAPY:   Adjuvant nivolumab, starting 10/10/21, currently q4weeks    ASSESSMENT:  Nathaniel Jennings is a 67 y.o. male with   Malignant neoplasm of overlapping sites of esophagus ([place]) yp(T3, N2) with upper mediastinal adenopathy  -diagnosed in 03/2021 -s/p concurrent chemoRT 05/24/21 - 07/04/21. He started with chemo TC then switched to FOLFOX on 06/20/21. He completed 4 cycles on 08/08/21. Posttreatment PET scan showed excellent response. -s/p esophagogastrectomy on 09/05/21 under Dr. Kipp Brood. -He has completed one year adjuvant Nivo, tolerated very well  -He is clinically doing very well, asymptomatic, lab reviewed, exam was unremarkable, no clinical concern for recurrence. -Plan to repeat surveillance CT scan in 3 months.   PLAN: -lab reviewed -lab/flush and f/u in 3 months -order Ct scan to be done before next visit    SUMMARY OF ONCOLOGIC HISTORY: Oncology History Overview Note   Cancer Staging  Malignant neoplasm of overlapping sites of esophagus [facility]) Staging form: Esophagus - Adenocarcinoma, AJCC 8th Edition - Clinical stage from 05/13/2021: Stage IVA (cT3, cN2, cM0) - Signed by [name], MD on 05/22/2021    Malignant neoplasm of overlapping sites of esophagus ([place])  04/18/2021 Imaging   ULTRASOUND OF HEAD/NECK SOFT TISSUES  IMPRESSION: No suspicious lymphadenopathy   04/19/2021 Procedure   EGD  Impression: - Esophageal polyp(s) were found. Biopsied. - Partially obstructing, likely malignant esophageal tumor was found in the distal esophagus. - Small hiatal hernia. - A single gastric polyp. Biopsied. - Normal duodenal bulb, first portion of the  duodenum and second portion of the duodenum.   04/19/2021 Pathology Results   FINAL MICROSCOPIC DIAGNOSIS: Stomach, Biopsy - FUNDIC GLAND POLYP. Negative for dysplasia. NO Helicobacter pylori organisms seen on H and E stain. Esophagus- Distal, Biopsy - INVASIVE MODERATELY DIFFERENTIATED ADENOCARCINOMA OF THE ESOPHAGUS.  - HER2 Study by Immunostain: Negative (score 1+) Esophagus- Mid, Biopsy - INVASIVE MODERATELY DIFFERENTIATED ADENOCARCINOMA OF THE ESOPHAGUS. - HER2 Study by Immunostain: Negative (score 1+)   05/05/2021 Imaging   CT CAP  IMPRESSION: 1. The patient's known esophageal mass is not well visualized. There is mild irregular wall thickening of the distal esophagus. Correlate with previous clinical evaluation. 2. Indeterminate mildly enlarged high right paratracheal node near the thoracic inlet. No other mediastinal adenopathy. 3. No evidence of metastatic disease in the abdomen or pelvis. 4. Stable small pulmonary nodules bilaterally consistent with benign findings. 5. Cholelithiasis, small renal cysts and Aortic Atherosclerosis (ICD10-I70.0).   05/09/2021 Initial Diagnosis   Malignant neoplasm of overlapping sites of esophagus ([place])   05/11/2021 Procedure   Upper EUS  Impression: - Mucosal nodule found in the esophagus. - Partially obstructing, malignant esophageal tumor was found at the gastroesophageal junction. - At least 3 discrete peritumoral lymph nodes - A mass was found in the gastroesophageal junction. A tissue diagnosis was obtained prior to this exam. This is of adenocarcinoma. This was staged T3 N2 Mx by endosonographic criteria. - Unable to traverse GE junction with either EGD scope or radial EUS scope.   05/13/2021 Cancer Staging   Staging form: Esophagus - Adenocarcinoma, AJCC 8th Edition - Clinical stage from 05/13/2021: Stage IVA (cT3, cN2, cM0) - Signed by [name], MD on 05/22/2021  05/16/2021 PET scan   IMPRESSION: 1. Distal esophageal mass  measuring about 6.2 cm in length, maximum SUV 13.1. 2. Mildly hypermetabolic and mildly enlarged upper right paratracheal lymph node, 1.3 cm in short axis with maximum SUV 3.6, concerning for malignant involvement. 3. Hypodense 1.1 cm right thyroid nodule has a maximum SUV substantially above that of the background thyroid. A significant minority of such nodules can harbor thyroid cancer. Recommend thyroid US and biopsy (ref: J Am Coll Radiol. 2015 Feb;12(2): 143-50). 4. 3 by 4 mm right lower lobe pulmonary nodule is similar to 05/05/2021, not hypermetabolic but below sensitive PET-CT size thresholds. Surveillance recommended. 5. Other imaging findings of potential clinical significance: Chronic left maxillary sinusitis. Aortic Atherosclerosis (ICD10-I70.0). Cholelithiasis. Prostatomegaly.   05/24/2021 - 06/13/2021 Chemotherapy         05/26/2021 Pathology Results   FINAL MICROSCOPIC DIAGNOSIS:   A. LYMPH NODE, 2R, FINE NEEDLE ASPIRATION:  - Malignant cells consistent with non-small cell carcinoma   COMMENT:  The cells are positive for cytokeratin 20 and CDX-2 (patchy). TTF-1, NapsinA, cytokeratin 5/6, p40, S100, MelanA, and cytokeratin 7 are negative. The patient's history of esophageal adenocarcinoma is noted and CDX-2 positivity is consistent with a gastrointestinal primary. This cytokeratin7/20 profile is usually seen in lower tract tumors, but this pattern can be seen in gastric/esophageal cases.   05/27/2021 - 07/04/2021 Radiation Therapy   Per Dr. Isidore Moos   06/20/2021 - 08/08/2021 Chemotherapy   Patient is on Treatment Plan : GASTROESOPHAGEAL FOLFOX q14d x 6 cycles     08/04/2021 PET scan   IMPRESSION: 1. Interval development of a new 6 mm short axis right cervical node with hypermetabolism. Finding is somewhat indeterminate given the apparent response of the hypermetabolic right paratracheal lymphadenopathy seen on the previous study. Close follow-up recommended as metastatic  disease not excluded. 2. Diffuse hypermetabolic FDG accumulation noted in the esophagus today with more diffuse circumferential esophageal wall thickening on today's study than on the previous exam. Hypermetabolism in the distal esophagus at the level of the hypermetabolic neoplasm previously has decreased from SUV max = 13 to SUV max = 7 today. 3. Stable tiny 3-4 mm right lower lobe and left upper lobe pulmonary nodules. Continued attention on follow-up recommended. 4. Cholelithiasis. 5. Emphysema.  (ICD10-J43.9) 6.  Aortic Atherosclerois (ICD10-170.0)   08/17/2021 Pathology Results   FINAL MICROSCOPIC DIAGNOSIS:  A. LYMPH NODE, RIGHT CERVICAL, FINE NEEDLE ASPIRATION:  - No malignant cells identified  - See comment   COMMENT:  There is scant cellularity and the specimen may not be representative.    09/05/2021 Definitive Surgery   FINAL MICROSCOPIC DIAGNOSIS:   A. ESOPHAGOGASTRECTOMY:  - Invasive poorly differentiated adenocarcinoma.  - Metastatic carcinoma involving 5 of 13 lymph nodes (5/13).  - See oncology table below.   B. LYMPH NODE, LEVEL 8, EXCISION:  - One lymph node, negative for malignancy (0/1).   09/05/2021 Cancer Staging   Staging form: Esophagus - Adenocarcinoma, AJCC 8th Edition - Pathologic stage from 09/05/2021: Stage IIIB (ypT3, pN2, cM0, G3) - Signed by [name], MD on 12/03/2021 Stage prefix: Post-therapy Response to neoadjuvant therapy: Partial response Histologic grading system: 3 grade system Residual tumor (R): R0 - None   10/10/2021 - 06/19/2022 Chemotherapy   Patient is on Treatment Plan : GASTROESOPHAGEAL Nivolumab q14d x 8 cycles / Nivolumab q28d     10/10/2021 -  Chemotherapy   Patient is on Treatment Plan : GASTROESOPHAGEAL Nivolumab (240) q14d x 8 cycles / Nivolumab (480) q28d  12/30/2021 Imaging   EXAM: CT CHEST, ABDOMEN, AND PELVIS WITH CONTRAST  IMPRESSION: Status post esophagectomy with gastric pull-through procedure. No evidence  of recurrent or metastatic carcinoma within the chest, abdomen, or pelvis.   Cholelithiasis. No radiographic evidence of cholecystitis.   Stable enlarged prostate.   Aortic Atherosclerosis (ICD10-I70.0).      INTERVAL HISTORY:  DANIK LANYON is here for a follow up of esophagus cancer    He was last seen by me on 09/19/2022 He presents to the clinic accompanied by wife. Pt stated she has been eating good. Pt stated he had bad acid reflux 3 weeks ago, but nothing after that. Pt still taking Omeprazole    All other systems were reviewed with the patient and are negative.  MEDICAL HISTORY:  Past Medical History:  Diagnosis Date   Arthritis    BPH (benign prostatic hyperplasia)    Dyslipidemia    Elevated PSA    Esophageal cancer ([place])    Full dentures    GERD (gastroesophageal reflux disease)    History of melanoma excision    07/ 2011  s/p  wide excision left back--- per pathology-- negative maligant --  mild melanocytic atypia   History of palpitations    cardiologist --  dr Shelva Majestic (last note in epic 2014   Hypertension    Sleep apnea     SURGICAL HISTORY: Past Surgical History:  Procedure Laterality Date   COLONOSCOPY  10/24/2007   ESOPHAGOGASTRODUODENOSCOPY N/A 09/05/2021   Procedure: ESOPHAGOGASTRODUODENOSCOPY (EGD);  Surgeon: Lajuana Matte, MD;  Location: [facility] OR;  Service: Thoracic;  Laterality: N/A;   ESOPHAGOGASTRODUODENOSCOPY (EGD) WITH PROPOFOL N/A 05/11/2021   Procedure: ESOPHAGOGASTRODUODENOSCOPY (EGD) WITH PROPOFOL;  Surgeon: Arta Silence, MD;  Location: [facility] ENDOSCOPY;  Service: Endoscopy;  Laterality: N/A;   EUS N/A 05/11/2021   Procedure: ESOPHAGEAL ENDOSCOPIC ULTRASOUND (EUS) RADIAL;  Surgeon: Arta Silence, MD;  Location: [facility] END[facility]COPY;  Service: Endoscopy;  Laterality: N/A;   EYE SURGERY     Lasik surgery on both eyes, but still needs glasses   FINE NEEDLE ASPIRATION  05/26/2021   Procedure: FINE NEEDLE ASPIRATION (FNA) LINEAR;  Surgeon:  Garner Nash, DO;  Location: [facility]COPY;  Service: Pulmonary;;   INTERCOSTAL NERVE BLOCK  09/05/2021   Procedure: INTERCOSTAL NERVE BLOCK;  Surgeon: Lajuana Matte, MD;  Location: [place];  Service: Thoracic;;   IR IMAGING GUIDED PORT INSERTION  06/30/2021   JEJUNOSTOMY  09/05/2021   Procedure: Shanon Rosser;  Surgeon: Lajuana Matte, MD;  Location: [place];  Service: Thoracic;;   PROSTATE BIOPSY N/A 07/02/2015   Procedure: BIOPSY TRANSRECTAL ULTRASONIC PROSTATE (TUBP);  Surgeon: Lowella Bandy, MD;  Location: [facility];  Service: Urology;  Laterality: N/A;   PROSTATE BIOPSY N/A 11/22/2015   Procedure: BIOPSY TRANSRECTAL ULTRASONIC PROSTATE (TUBP);  Surgeon: Cleon Gustin, MD;  Location: [facility];  Service: Urology;  Laterality: N/A;   TRANSTHORACIC ECHOCARDIOGRAM  11/19/2012   normal echo/  trivial TR   VIDEO BRONCHOSCOPY WITH ENDOBRONCHIAL ULTRASOUND N/A 05/26/2021   Procedure: VIDEO BRONCHOSCOPY WITH ENDOBRONCHIAL ULTRASOUND;  Surgeon: Garner Nash, DO;  Location: [place];  Service: Pulmonary;  Laterality: N/A;   WIDE EXCISION MELANOMA LEFT BACK  05/02/2010    I have reviewed the social history and family history with the patient and they are unchanged from previous note.  ALLERGIES:  is allergic to bee venom.  MEDICATIONS:  Current Outpatient Medications  Medication Sig Dispense Refill   amoxicillin-clavulanate (  AUGMENTIN) 875-125 MG tablet Take 1 tablet by mouth 2 (two) times daily. 14 tablet 0   aspirin EC 81 MG tablet Take 81 mg by mouth in the morning.     ketoconazole (NIZORAL) 2 % cream APPLY 1 APPLICATION TOPICALLY DAILY 15 g 0   metoprolol succinate (TOPROL-XL) 50 MG 24 hr tablet Take 50 mg by mouth daily. Take with or immediately following a meal.     Multiple Vitamin (MULTIVITAMIN WITH MINERALS) TABS tablet Take 1 tablet by mouth daily.     omeprazole (PRILOSEC) 40 MG capsule Take 40 mg by mouth 2 (two) times daily.      Skin Protectants, Misc. (EUCERIN) cream Apply 1 application. topically daily as needed for dry skin.     sucralfate (CARAFATE) 1 g tablet Take 1 g by mouth 2 (two) times daily.     No current facility-administered medications for this visit.    PHYSICAL EXAMINATION: ECOG PERFORMANCE STATUS: 0 - Asymptomatic  Vitals:   12/21/22 1300  BP: (!) 163/94  Pulse: 64  Resp: 18  Temp: 98.1 F (36.7 C)  SpO2: 100%   Wt Readings from Last 3 Encounters:  12/21/22 164 lb 4.8 oz (74.5 kg)  09/11/22 156 lb 4.8 oz (70.9 kg)  08/14/22 152 lb 3.2 oz (69 kg)     NECK: (-) supple, thyroid normal size, non-tender, without nodularity LYMPH:  (-)no palpable lymphadenopathy in the cervical, axillary  LUNGS: (-)clear to auscultation and percussion with normal breathing effort HEART: (-) regular rate & rhythm and no murmurs and no lower extremity edema ABDOMEN:(-) abdomen soft, non-tender and normal bowel sounds   LABORATORY DATA:  I have reviewed the data as listed    Latest Ref Rng & Units 12/21/2022   12:11 PM 09/11/2022    9:39 AM 08/14/2022    8:50 AM  CBC  WBC 4.0 - 10.5 K/uL 4.3  4.5  4.2   Hemoglobin 13.0 - 17.0 g/dL 12.6  12.8  12.9   Hematocrit 39.0 - 52.0 % 35.9  36.8  37.2   Platelets 150 - 400 K/uL 156  147  148         Latest Ref Rng & Units 12/21/2022   12:11 PM 09/11/2022    9:39 AM 08/14/2022    8:50 AM  CMP  Glucose 70 - 99 mg/dL 88  107  96   BUN 8 - 23 mg/dL '12  15  13   '$ Creatinine 0.61 - 1.24 mg/dL 0.81  0.83  0.83   Sodium 135 - 145 mmol/L 139  138  139   Potassium 3.5 - 5.1 mmol/L 3.6  3.8  3.9   Chloride 98 - 111 mmol/L 105  105  106   CO2 22 - 32 mmol/L '28  28  27   '$ Calcium 8.9 - 10.3 mg/dL 8.5  9.5  9.2   Total Protein 6.5 - 8.1 g/dL 6.5  7.2  6.9   Total Bilirubin 0.3 - 1.2 mg/dL 0.5  0.7  0.6   Alkaline Phos 38 - 126 U/L 68  69  70   AST 15 - 41 U/L '16  15  22   '$ ALT 0 - 44 U/L '16  13  13       '$ RADIOGRAPHIC STUDIES: I have personally reviewed the  radiological images as listed and agreed with the findings in the report. No results found.    Orders Placed This Encounter  Procedures   CT CHEST ABDOMEN PELVIS W CONTRAST  Standing Status:   Future    Standing Expiration Date:   12/21/2023    Order Specific Question:   Preferred imaging location?    Answer:   [facility]    Order Specific Question:   Is Oral Contrast requested for this exam?    Answer:   Yes, Per Radiology protocol   All questions were answered. The patient knows to call the clinic with any problems, questions or concerns. No barriers to learning was detected. The total time spent in the appointment was 25 minutes.     [name], MD 12/21/2022   Felicity Coyer, CMA, am acting as scribe for [name], MD.   I have reviewed the above documentation for accuracy and completeness, and I agree with the above.

## 2022-12-23 ENCOUNTER — Other Ambulatory Visit: Payer: Self-pay

## 2022-12-26 ENCOUNTER — Other Ambulatory Visit: Payer: Self-pay

## 2022-12-26 ENCOUNTER — Ambulatory Visit
Admission: RE | Admit: 2022-12-26 | Discharge: 2022-12-26 | Disposition: A | Discharge status: Home / Self Care | Payer: Commercial Managed Care - PPO | Source: Ambulatory Visit | Attending: Urology | Admitting: Urology

## 2022-12-26 DIAGNOSIS — R972 Elevated prostate specific antigen [PSA]: Secondary | ICD-10-CM

## 2022-12-26 DIAGNOSIS — C158 Malignant neoplasm of overlapping sites of esophagus: Secondary | ICD-10-CM

## 2022-12-26 MED ORDER — GADOPICLENOL 0.5 MMOL/ML IV SOLN
8.0000 mL | Freq: Once | INTRAVENOUS | Status: AC | PRN
  Administered 2022-12-26: 8 mL via INTRAVENOUS

## 2023-01-24 ENCOUNTER — Encounter: Payer: Self-pay | Admitting: Hematology

## 2023-02-02 ENCOUNTER — Inpatient Hospital Stay: Payer: Commercial Managed Care - PPO | Attending: Hematology

## 2023-02-02 ENCOUNTER — Other Ambulatory Visit: Payer: Self-pay

## 2023-02-02 DIAGNOSIS — C158 Malignant neoplasm of overlapping sites of esophagus: Secondary | ICD-10-CM | POA: Diagnosis present

## 2023-02-02 DIAGNOSIS — Z95828 Presence of other vascular implants and grafts: Secondary | ICD-10-CM

## 2023-02-02 DIAGNOSIS — E860 Dehydration: Secondary | ICD-10-CM

## 2023-02-02 MED ORDER — SODIUM CHLORIDE 0.9% FLUSH
10.0000 mL | Freq: Once | INTRAVENOUS | Status: AC
  Administered 2023-02-02: 10 mL

## 2023-02-02 MED ORDER — HEPARIN SOD (PORK) LOCK FLUSH 100 UNIT/ML IV SOLN
500.0000 [IU] | Freq: Once | INTRAVENOUS | Status: AC
  Administered 2023-02-02: 500 [IU]

## 2023-03-20 ENCOUNTER — Ambulatory Visit (HOSPITAL_COMMUNITY)
Admission: RE | Admit: 2023-03-20 | Discharge: 2023-03-20 | Disposition: A | Discharge status: Home / Self Care | Payer: Commercial Managed Care - PPO | Source: Ambulatory Visit | Attending: Hematology | Admitting: Hematology

## 2023-03-20 ENCOUNTER — Inpatient Hospital Stay: Payer: Commercial Managed Care - PPO | Attending: Hematology

## 2023-03-20 DIAGNOSIS — Z95828 Presence of other vascular implants and grafts: Secondary | ICD-10-CM

## 2023-03-20 DIAGNOSIS — R918 Other nonspecific abnormal finding of lung field: Secondary | ICD-10-CM | POA: Diagnosis not present

## 2023-03-20 DIAGNOSIS — C158 Malignant neoplasm of overlapping sites of esophagus: Secondary | ICD-10-CM | POA: Insufficient documentation

## 2023-03-20 DIAGNOSIS — K8020 Calculus of gallbladder without cholecystitis without obstruction: Secondary | ICD-10-CM | POA: Diagnosis not present

## 2023-03-20 DIAGNOSIS — E860 Dehydration: Secondary | ICD-10-CM

## 2023-03-20 DIAGNOSIS — K5730 Diverticulosis of large intestine without perforation or abscess without bleeding: Secondary | ICD-10-CM | POA: Diagnosis not present

## 2023-03-20 DIAGNOSIS — I700 Atherosclerosis of aorta: Secondary | ICD-10-CM | POA: Diagnosis not present

## 2023-03-20 LAB — CMP (CANCER CENTER ONLY)
ALT: 14 U/L (ref 0–44)
AST: 16 U/L (ref 15–41)
Albumin: 4.2 g/dL (ref 3.5–5.0)
Alkaline Phosphatase: 65 U/L (ref 38–126)
Anion gap: 7 (ref 5–15)
BUN: 15 mg/dL (ref 8–23)
CO2: 28 mmol/L (ref 22–32)
Calcium: 9.3 mg/dL (ref 8.9–10.3)
Chloride: 103 mmol/L (ref 98–111)
Creatinine: 1.31 mg/dL — ABNORMAL HIGH (ref 0.61–1.24)
GFR, Estimated: >60 mL/min (ref >60)
Glucose, Bld: 98 mg/dL (ref 70–99)
Potassium: 4.1 mmol/L (ref 3.5–5.1)
Sodium: 138 mmol/L (ref 135–145)
Total Bilirubin: 0.5 mg/dL (ref 0.3–1.2)
Total Protein: 6.9 g/dL (ref 6.5–8.1)

## 2023-03-20 LAB — CBC WITH DIFFERENTIAL (CANCER CENTER ONLY)
Abs Immature Granulocytes: 0.03 10*3/uL (ref 0.00–0.07)
Basophils Absolute: 0.1 10*3/uL (ref 0.0–0.1)
Basophils Relative: 1 %
Eosinophils Absolute: 0.1 10*3/uL (ref 0.0–0.5)
Eosinophils Relative: 2 %
HCT: 36.7 % — ABNORMAL LOW (ref 39.0–52.0)
Hemoglobin: 12.9 g/dL — ABNORMAL LOW (ref 13.0–17.0)
Immature Granulocytes: 1 %
Lymphocytes Relative: 19 %
Lymphs Abs: 0.9 10*3/uL (ref 0.7–4.0)
MCH: 30.9 pg (ref 26.0–34.0)
MCHC: 35.1 g/dL (ref 30.0–36.0)
MCV: 87.8 fL (ref 80.0–100.0)
Monocytes Absolute: 0.5 10*3/uL (ref 0.1–1.0)
Monocytes Relative: 11 %
Neutro Abs: 3.2 10*3/uL (ref 1.7–7.7)
Neutrophils Relative %: 66 %
Platelet Count: 171 10*3/uL (ref 150–400)
RBC: 4.18 MIL/uL — ABNORMAL LOW (ref 4.22–5.81)
RDW: 14.1 % (ref 11.5–15.5)
WBC Count: 4.8 10*3/uL (ref 4.0–10.5)
nRBC: 0 % (ref 0.0–0.2)

## 2023-03-20 MED ORDER — IOHEXOL 300 MG/ML  SOLN
100.0000 mL | Freq: Once | INTRAMUSCULAR | Status: AC | PRN
  Administered 2023-03-20: 100 mL via INTRAVENOUS

## 2023-03-20 MED ORDER — HEPARIN SOD (PORK) LOCK FLUSH 100 UNIT/ML IV SOLN
INTRAVENOUS | Status: AC
  Filled 2023-03-20: qty 5

## 2023-03-20 MED ORDER — HEPARIN SOD (PORK) LOCK FLUSH 100 UNIT/ML IV SOLN
500.0000 [IU] | Freq: Once | INTRAVENOUS | Status: AC
  Administered 2023-03-20: 500 [IU]

## 2023-03-20 MED ORDER — SODIUM CHLORIDE (PF) 0.9 % IJ SOLN
INTRAMUSCULAR | Status: AC
  Filled 2023-03-20: qty 50

## 2023-03-20 MED ORDER — SODIUM CHLORIDE 0.9% FLUSH
10.0000 mL | Freq: Once | INTRAVENOUS | Status: AC
  Administered 2023-03-20: 10 mL

## 2023-03-22 ENCOUNTER — Inpatient Hospital Stay: Payer: Commercial Managed Care - PPO | Admitting: Hematology

## 2023-03-22 ENCOUNTER — Encounter: Payer: Self-pay | Admitting: Hematology

## 2023-03-22 VITALS — BP 141/82 | HR 66 | Temp 98.2°F | Resp 15 | Ht 66.0 in | Wt 163.0 lb

## 2023-03-22 DIAGNOSIS — C158 Malignant neoplasm of overlapping sites of esophagus: Secondary | ICD-10-CM | POA: Diagnosis not present

## 2023-03-22 NOTE — Progress Notes (Signed)
[facility]   [phone] [phone]   Clinic Follow up Note   Patient Care Team: Lonie Peak, Cordelia Poche as PCP - General (Physician Assistant) Malachy Mood, MD as Consulting Physician (Oncology)  Date of Service:  03/22/2023  CHIEF COMPLAINT: f/u of esophagus cancer     CURRENT THERAPY:  Cancer surveillance  ASSESSMENT:  Nathaniel Jennings is a 67 y.o. male with    Malignant neoplasm of overlapping sites of esophagus (HCC) yp(T3, N2) with upper mediastinal adenopathy  -diagnosed in 03/2021 -s/p concurrent chemoRT 05/24/21 - 07/04/21. He started with chemo TC then switched to FOLFOX on 06/20/21. He completed 4 cycles on 08/08/21. Posttreatment PET scan showed excellent response. -s/p esophagogastrectomy on 09/05/21 under Dr. Cliffton Asters. -He has completed one year adjuvant Nivo, tolerated very well  -He is clinically doing very well, asymptomatic, lab reviewed, exam was unremarkable, no clinical concern for recurrence. -Surveillance CT scan from Mar 20, 2023 showed no evidence of recurrence, the irregular nodular consolidation in the anterior right lower lobe is likely infectious, probable from aspiration pneumonia, he is asymptomatic.  Will continue follow-up -He is clinically doing well overall, asymptomatic, weight is stable, no clinical concern for recurrence. -Continue cancer surveillance       PLAN: -lab reviewed - continue surveillance -repeat Ct scan in 6 months -lflush in 6 weeks -lab/flush and f/u in 3 months   SUMMARY OF ONCOLOGIC HISTORY: Oncology History Overview Note   Cancer Staging  Malignant neoplasm of overlapping sites of esophagus [facility]) Staging form: Esophagus - Adenocarcinoma, AJCC 8th Edition - Clinical stage from 05/13/2021: Stage IVA (cT3, cN2, cM0) - Signed by Malachy Mood, MD on 05/22/2021    Malignant neoplasm of overlapping sites of esophagus (HCC)  04/18/2021 Imaging   ULTRASOUND OF HEAD/NECK SOFT TISSUES  IMPRESSION: No  suspicious lymphadenopathy   04/19/2021 Procedure   EGD  Impression: - Esophageal polyp(s) were found. Biopsied. - Partially obstructing, likely malignant esophageal tumor was found in the distal esophagus. - Small hiatal hernia. - A single gastric polyp. Biopsied. - Normal duodenal bulb, first portion of the duodenum and second portion of the duodenum.   04/19/2021 Pathology Results   FINAL MICROSCOPIC DIAGNOSIS: Stomach, Biopsy - FUNDIC GLAND POLYP. Negative for dysplasia. NO Helicobacter pylori organisms seen on H and E stain. Esophagus- Distal, Biopsy - INVASIVE MODERATELY DIFFERENTIATED ADENOCARCINOMA OF THE ESOPHAGUS.  - HER2 Study by Immunostain: Negative (score 1+) Esophagus- Mid, Biopsy - INVASIVE MODERATELY DIFFERENTIATED ADENOCARCINOMA OF THE ESOPHAGUS. - HER2 Study by Immunostain: Negative (score 1+)   05/05/2021 Imaging   CT CAP  IMPRESSION: 1. The patient's known esophageal mass is not well visualized. There is mild irregular wall thickening of the distal esophagus. Correlate with previous clinical evaluation. 2. Indeterminate mildly enlarged high right paratracheal node near the thoracic inlet. No other mediastinal adenopathy. 3. No evidence of metastatic disease in the abdomen or pelvis. 4. Stable small pulmonary nodules bilaterally consistent with benign findings. 5. Cholelithiasis, small renal cysts and Aortic Atherosclerosis (ICD10-I70.0).   05/09/2021 Initial Diagnosis   Malignant neoplasm of overlapping sites of esophagus (HCC)   05/11/2021 Procedure   Upper EUS  Impression: - Mucosal nodule found in the esophagus. - Partially obstructing, malignant esophageal tumor was found at the gastroesophageal junction. - At least 3 discrete peritumoral lymph nodes - A mass was found in the gastroesophageal junction. A tissue diagnosis was obtained prior to this exam. This is of adenocarcinoma. This was staged T3 N2 Mx by endosonographic criteria. -  Unable to  traverse GE junction with either EGD scope or radial EUS scope.   05/13/2021 Cancer Staging   Staging form: Esophagus - Adenocarcinoma, AJCC 8th Edition - Clinical stage from 05/13/2021: Stage IVA (cT3, cN2, cM0) - Signed by Malachy Mood, MD on 05/22/2021   05/16/2021 PET scan   IMPRESSION: 1. Distal esophageal mass measuring about 6.2 cm in length, maximum SUV 13.1. 2. Mildly hypermetabolic and mildly enlarged upper right paratracheal lymph node, 1.3 cm in short axis with maximum SUV 3.6, concerning for malignant involvement. 3. Hypodense 1.1 cm right thyroid nodule has a maximum SUV substantially above that of the background thyroid. A significant minority of such nodules can harbor thyroid cancer. Recommend thyroid US and biopsy (ref: J Am Coll Radiol. 2015 Feb;12(2): 143-50). 4. 3 by 4 mm right lower lobe pulmonary nodule is similar to 05/05/2021, not hypermetabolic but below sensitive PET-CT size thresholds. Surveillance recommended. 5. Other imaging findings of potential clinical significance: Chronic left maxillary sinusitis. Aortic Atherosclerosis (ICD10-I70.0). Cholelithiasis. Prostatomegaly.   05/24/2021 - 06/13/2021 Chemotherapy         05/26/2021 Pathology Results   FINAL MICROSCOPIC DIAGNOSIS:   A. LYMPH NODE, 2R, FINE NEEDLE ASPIRATION:  - Malignant cells consistent with non-small cell carcinoma   COMMENT:  The cells are positive for cytokeratin 20 and CDX-2 (patchy). TTF-1, NapsinA, cytokeratin 5/6, p40, S100, MelanA, and cytokeratin 7 are negative. The patient's history of esophageal adenocarcinoma is noted and CDX-2 positivity is consistent with a gastrointestinal primary. This cytokeratin7/20 profile is usually seen in lower tract tumors, but this pattern can be seen in gastric/esophageal cases.   05/27/2021 - 07/04/2021 Radiation Therapy   Per Dr. Basilio Cairo   06/20/2021 - 08/08/2021 Chemotherapy   Patient is on Treatment Plan : GASTROESOPHAGEAL FOLFOX q14d x 6 cycles      08/04/2021 PET scan   IMPRESSION: 1. Interval development of a new 6 mm short axis right cervical node with hypermetabolism. Finding is somewhat indeterminate given the apparent response of the hypermetabolic right paratracheal lymphadenopathy seen on the previous study. Close follow-up recommended as metastatic disease not excluded. 2. Diffuse hypermetabolic FDG accumulation noted in the esophagus today with more diffuse circumferential esophageal wall thickening on today's study than on the previous exam. Hypermetabolism in the distal esophagus at the level of the hypermetabolic neoplasm previously has decreased from SUV max = 13 to SUV max = 7 today. 3. Stable tiny 3-4 mm right lower lobe and left upper lobe pulmonary nodules. Continued attention on follow-up recommended. 4. Cholelithiasis. 5. Emphysema.  (ICD10-J43.9) 6.  Aortic Atherosclerois (ICD10-170.0)   08/17/2021 Pathology Results   FINAL MICROSCOPIC DIAGNOSIS:  A. LYMPH NODE, RIGHT CERVICAL, FINE NEEDLE ASPIRATION:  - No malignant cells identified  - See comment   COMMENT:  There is scant cellularity and the specimen may not be representative.    09/05/2021 Definitive Surgery   FINAL MICROSCOPIC DIAGNOSIS:   A. ESOPHAGOGASTRECTOMY:  - Invasive poorly differentiated adenocarcinoma.  - Metastatic carcinoma involving 5 of 13 lymph nodes (5/13).  - See oncology table below.   B. LYMPH NODE, LEVEL 8, EXCISION:  - One lymph node, negative for malignancy (0/1).   09/05/2021 Cancer Staging   Staging form: Esophagus - Adenocarcinoma, AJCC 8th Edition - Pathologic stage from 09/05/2021: Stage IIIB (ypT3, pN2, cM0, G3) - Signed by Malachy Mood, MD on 12/03/2021 Stage prefix: Post-therapy Response to neoadjuvant therapy: Partial response Histologic grading system: 3 grade system Residual tumor (R): R0 - None   10/10/2021 -  06/19/2022 Chemotherapy   Patient is on Treatment Plan : GASTROESOPHAGEAL Nivolumab q14d x 8 cycles /  Nivolumab q28d     10/10/2021 -  Chemotherapy   Patient is on Treatment Plan : GASTROESOPHAGEAL Nivolumab (240) q14d x 8 cycles / Nivolumab (480) q28d     12/30/2021 Imaging   EXAM: CT CHEST, ABDOMEN, AND PELVIS WITH CONTRAST  IMPRESSION: Status post esophagectomy with gastric pull-through procedure. No evidence of recurrent or metastatic carcinoma within the chest, abdomen, or pelvis.   Cholelithiasis. No radiographic evidence of cholecystitis.   Stable enlarged prostate.   Aortic Atherosclerosis (ICD10-I70.0).      INTERVAL HISTORY:  Nathaniel Jennings is here for a follow up of esophagus cancer . He was last seen by me on 12/21/2022, He presents to the clinic accompanied by wife. Pt state that he has no issue. He is clinically doing well.      All other systems were reviewed with the patient and are negative.  MEDICAL HISTORY:  Past Medical History:  Diagnosis Date   Arthritis    BPH (benign prostatic hyperplasia)    Dyslipidemia    Elevated PSA    Esophageal cancer (HCC)    Full dentures    GERD (gastroesophageal reflux disease)    History of melanoma excision    07/ 2011  s/p  wide excision left back--- per pathology-- negative maligant --  mild melanocytic atypia   History of palpitations    cardiologist --  dr Nicki Guadalaj[place] (last note in epic 2014   Hypertension    Sleep apnea     SURGICAL HISTORY: Past Surgical History:  Procedure Laterality Date   COLONOSCOPY  10/24/2007   ESOPHAGOGASTRODUODENOSCOPY N/A 09/05/2021   Procedure: ESOPHAGOGASTRODUODENOSCOPY (EGD);  Surgeon: Corliss Skains, MD;  Location: [facility] OR;  Service: Thoracic;  Laterality: N/A;   ESOPHAGOGASTRODUODENOSCOPY (EGD) WITH PROPOFOL N/A 05/11/2021   Procedure: ESOPHAGOGASTRODUODENOSCOPY (EGD) WITH PROPOFOL;  Surgeon: Willis Modena, MD;  Location: [facility] ENDOSCOPY;  Service: Endoscopy;  Laterality: N/A;   EUS N/A 05/11/2021   Procedure: ESOPHAGEAL ENDOSCOPIC ULTRASOUND (EUS) RADIAL;  Surgeon:  Willis Modena, MD;  Location: [facility] ENDOSCOPY;  Service: Endoscopy;  Laterality: N/A;   EYE SURGERY     Lasik surgery on both eyes, but still needs glasses   FINE NEEDLE ASPIRATION  05/26/2021   Procedure: FINE NEEDLE ASPIRATION (FNA) LINEAR;  Surgeon: Josephine Igo, DO;  Location: [facility] ENDOSCOPY;  Service: Pulmonary;;   INTERCOSTAL NERVE BLOCK  09/05/2021   Procedure: INTERCOSTAL NERVE BLOCK;  Surgeon: Corliss Skains, MD;  Location: [facility] OR;  Service: Thoracic;;   IR IMAGING GUIDED PORT INSERTION  06/30/2021   JEJUNOSTOMY  09/05/2021   Procedure: Rance Muir;  Surgeon: Corliss Skains, MD;  Location: [facility] OR;  Service: Thoracic;;   PROSTATE BIOPSY N/A 07/02/2015   Procedure: BIOPSY TRANSRECTAL ULTRASONIC PROSTATE (TUBP);  Surgeon: Su Grand, MD;  Location: [facility];  Service: Urology;  Laterality: N/A;   PROSTATE BIOPSY N/A 11/22/2015   Procedure: BIOPSY TRANSRECTAL ULTRASONIC PROSTATE (TUBP);  Surgeon: Malen Gauze, MD;  Location: [facility];  Service: Urology;  Laterality: N/A;   TRANSTHORACIC ECHOCARDIOGRAM  11/19/2012   normal echo/  trivial TR   VIDEO BRONCHOSCOPY WITH ENDOBRONCHIAL ULTRASOUND N/A 05/26/2021   Procedure: VIDEO BRONCHOSCOPY WITH ENDOBRONCHIAL ULTRASOUND;  Surgeon: Josephine Igo, DO;  Location: [facility] ENDOSCOPY;  Service: Pulmonary;  Laterality: N/A;   WIDE EXCISION MELANOMA LEFT BACK  05/02/2010    I have reviewed the  social history and family history with the patient and they are unchanged from previous note.  ALLERGIES:  is allergic to bee venom.  MEDICATIONS:  Current Outpatient Medications  Medication Sig Dispense Refill   amoxicillin-clavulanate (AUGMENTIN) 875-125 MG tablet Take 1 tablet by mouth 2 (two) times daily. 14 tablet 0   aspirin EC 81 MG tablet Take 81 mg by mouth in the morning.     ketoconazole (NIZORAL) 2 % cream APPLY 1 APPLICATION TOPICALLY DAILY 15 g 0   metoprolol succinate (TOPROL-XL) 50 MG 24 hr  tablet Take 50 mg by mouth daily. Take with or immediately following a meal.     Multiple Vitamin (MULTIVITAMIN WITH MINERALS) TABS tablet Take 1 tablet by mouth daily.     omeprazole (PRILOSEC) 40 MG capsule Take 40 mg by mouth 2 (two) times daily.     Skin Protectants, Misc. (EUCERIN) cream Apply 1 application. topically daily as needed for dry skin.     sucralfate (CARAFATE) 1 g tablet Take 1 g by mouth 2 (two) times daily.     No current facility-administered medications for this visit.    PHYSICAL EXAMINATION: ECOG PERFORMANCE STATUS: 0 - Asymptomatic  Vitals:   03/22/23 1434  BP: (!) 141/82  Pulse: 66  Resp: 15  Temp: 98.2 F (36.8 C)  SpO2: 99%   Wt Readings from Last 3 Encounters:  03/22/23 163 lb (73.9 kg)  12/21/22 164 lb 4.8 oz (74.5 kg)  09/11/22 156 lb 4.8 oz (70.9 kg)     GENERAL:alert, no distress and comfortable SKIN: skin color normal, no rashes or significant lesions EYES: normal, Conjunctiva are pink and non-injected, sclera clear  NEURO: alert & oriented x 3 with fluent speech   LABORATORY DATA:  I have reviewed the data as listed    Latest Ref Rng & Units 03/20/2023   11:30 AM 12/21/2022   12:11 PM 09/11/2022    9:39 AM  CBC  WBC 4.0 - 10.5 K/uL 4.8  4.3  4.5   Hemoglobin 13.0 - 17.0 g/dL 78.2  95.6  21.3   Hematocrit 39.0 - 52.0 % 36.7  35.9  36.8   Platelets 150 - 400 K/uL 171  156  147         Latest Ref Rng & Units 03/20/2023   11:30 AM 12/21/2022   12:11 PM 09/11/2022    9:39 AM  CMP  Glucose 70 - 99 mg/dL 98  88  086   BUN 8 - 23 mg/dL 15  12  15    Creatinine 0.61 - 1.24 mg/dL 5.78  4.69  6.29   Sodium 135 - 145 mmol/L 138  139  138   Potassium 3.5 - 5.1 mmol/L 4.1  3.6  3.8   Chloride 98 - 111 mmol/L 103  105  105   CO2 22 - 32 mmol/L 28  28  28    Calcium 8.9 - 10.3 mg/dL 9.3  8.5  9.5   Total Protein 6.5 - 8.1 g/dL 6.9  6.5  7.2   Total Bilirubin 0.3 - 1.2 mg/dL 0.5  0.5  0.7   Alkaline Phos 38 - 126 U/L 65  68  69   AST 15 -  41 U/L 16  16  15    ALT 0 - 44 U/L 14  16  13        RADIOGRAPHIC STUDIES: I have personally reviewed the radiological images as listed and agreed with the findings in the report. No results found.  No orders of the defined types were placed in this encounter.  All questions were answered. The patient knows to call the clinic with any problems, questions or concerns. No barriers to learning was detected. The total time spent in the appointment was 25 minutes.     Malachy Mood, MD 03/22/2023   Carolin Coy, CMA, am acting as scribe for Malachy Mood, MD.   I have reviewed the above documentation for accuracy and completeness, and I agree with the above.

## 2023-03-23 ENCOUNTER — Other Ambulatory Visit: Payer: Self-pay

## 2023-04-05 ENCOUNTER — Other Ambulatory Visit: Payer: Self-pay

## 2023-05-03 ENCOUNTER — Inpatient Hospital Stay: Payer: Commercial Managed Care - PPO | Attending: Hematology

## 2023-05-03 DIAGNOSIS — C158 Malignant neoplasm of overlapping sites of esophagus: Secondary | ICD-10-CM | POA: Insufficient documentation

## 2023-05-03 DIAGNOSIS — E860 Dehydration: Secondary | ICD-10-CM

## 2023-05-03 DIAGNOSIS — Z95828 Presence of other vascular implants and grafts: Secondary | ICD-10-CM

## 2023-05-03 MED ORDER — HEPARIN SOD (PORK) LOCK FLUSH 100 UNIT/ML IV SOLN
500.0000 [IU] | Freq: Once | INTRAVENOUS | Status: AC
  Administered 2023-05-03: 500 [IU]

## 2023-05-03 MED ORDER — SODIUM CHLORIDE 0.9% FLUSH
10.0000 mL | Freq: Once | INTRAVENOUS | Status: AC
  Administered 2023-05-03: 10 mL

## 2023-06-13 ENCOUNTER — Other Ambulatory Visit: Payer: Self-pay

## 2023-06-13 DIAGNOSIS — C158 Malignant neoplasm of overlapping sites of esophagus: Secondary | ICD-10-CM

## 2023-06-14 ENCOUNTER — Inpatient Hospital Stay: Payer: Commercial Managed Care - PPO | Attending: Hematology

## 2023-06-14 ENCOUNTER — Encounter: Payer: Self-pay | Admitting: Hematology

## 2023-06-14 ENCOUNTER — Inpatient Hospital Stay: Payer: Commercial Managed Care - PPO | Admitting: Hematology

## 2023-06-14 VITALS — BP 153/91 | HR 69 | Temp 97.7°F | Resp 18 | Ht 66.0 in | Wt 165.4 lb

## 2023-06-14 DIAGNOSIS — Z923 Personal history of irradiation: Secondary | ICD-10-CM | POA: Insufficient documentation

## 2023-06-14 DIAGNOSIS — Z8501 Personal history of malignant neoplasm of esophagus: Secondary | ICD-10-CM | POA: Diagnosis present

## 2023-06-14 DIAGNOSIS — Z95828 Presence of other vascular implants and grafts: Secondary | ICD-10-CM

## 2023-06-14 DIAGNOSIS — Z85820 Personal history of malignant melanoma of skin: Secondary | ICD-10-CM | POA: Insufficient documentation

## 2023-06-14 DIAGNOSIS — C158 Malignant neoplasm of overlapping sites of esophagus: Secondary | ICD-10-CM

## 2023-06-14 DIAGNOSIS — E860 Dehydration: Secondary | ICD-10-CM

## 2023-06-14 DIAGNOSIS — Z9221 Personal history of antineoplastic chemotherapy: Secondary | ICD-10-CM | POA: Diagnosis not present

## 2023-06-14 LAB — CMP (CANCER CENTER ONLY)
ALT: 15 U/L (ref 0–44)
AST: 17 U/L (ref 15–41)
Albumin: 4.1 g/dL (ref 3.5–5.0)
Alkaline Phosphatase: 62 U/L (ref 38–126)
Anion gap: 6 (ref 5–15)
BUN: 14 mg/dL (ref 8–23)
CO2: 28 mmol/L (ref 22–32)
Calcium: 9 mg/dL (ref 8.9–10.3)
Chloride: 107 mmol/L (ref 98–111)
Creatinine: 0.95 mg/dL (ref 0.61–1.24)
GFR, Estimated: >60 mL/min (ref >60)
Glucose, Bld: 75 mg/dL (ref 70–99)
Potassium: 3.9 mmol/L (ref 3.5–5.1)
Sodium: 141 mmol/L (ref 135–145)
Total Bilirubin: 0.4 mg/dL (ref 0.3–1.2)
Total Protein: 7 g/dL (ref 6.5–8.1)

## 2023-06-14 LAB — CBC WITH DIFFERENTIAL (CANCER CENTER ONLY)
Abs Immature Granulocytes: 0.05 10*3/uL (ref 0.00–0.07)
Basophils Absolute: 0.1 10*3/uL (ref 0.0–0.1)
Basophils Relative: 1 %
Eosinophils Absolute: 0.1 10*3/uL (ref 0.0–0.5)
Eosinophils Relative: 3 %
HCT: 36.4 % — ABNORMAL LOW (ref 39.0–52.0)
Hemoglobin: 12.4 g/dL — ABNORMAL LOW (ref 13.0–17.0)
Immature Granulocytes: 1 %
Lymphocytes Relative: 22 %
Lymphs Abs: 1.1 10*3/uL (ref 0.7–4.0)
MCH: 30.8 pg (ref 26.0–34.0)
MCHC: 34.1 g/dL (ref 30.0–36.0)
MCV: 90.5 fL (ref 80.0–100.0)
Monocytes Absolute: 0.6 10*3/uL (ref 0.1–1.0)
Monocytes Relative: 12 %
Neutro Abs: 3 10*3/uL (ref 1.7–7.7)
Neutrophils Relative %: 61 %
Platelet Count: 195 10*3/uL (ref 150–400)
RBC: 4.02 MIL/uL — ABNORMAL LOW (ref 4.22–5.81)
RDW: 14.6 % (ref 11.5–15.5)
WBC Count: 4.9 10*3/uL (ref 4.0–10.5)
nRBC: 0 % (ref 0.0–0.2)

## 2023-06-14 MED ORDER — SODIUM CHLORIDE 0.9% FLUSH
10.0000 mL | Freq: Once | INTRAVENOUS | Status: AC
  Administered 2023-06-14: 10 mL

## 2023-06-14 MED ORDER — HEPARIN SOD (PORK) LOCK FLUSH 100 UNIT/ML IV SOLN
500.0000 [IU] | Freq: Once | INTRAVENOUS | Status: AC
  Administered 2023-06-14: 500 [IU]

## 2023-06-14 NOTE — Progress Notes (Signed)
[facility]   Telephone:(336) [phone] Fax:(336) [phone]   Clinic Follow up Note   Patient Care Team: Arlyss Queen as PCP - General (Physician Assistant) Malachy Mood, MD as Consulting Physician (Oncology)  Date of Service:  06/14/2023  CHIEF COMPLAINT: f/u of  esophagus cancer   CURRENT THERAPY:  Cancer surveillance   ASSESSMENT:  Nathaniel Jennings is a 67 y.o. male with   Malignant neoplasm of overlapping sites of esophagus (HCC) yp(T3, N2) with upper mediastinal adenopathy  -diagnosed in 03/2021 -s/p concurrent chemoRT 05/24/21 - 07/04/21. He started with chemo TC then switched to FOLFOX on 06/20/21. He completed 4 cycles on 08/08/21. Posttreatment PET scan showed excellent response. -s/p esophagogastrectomy on 09/05/21 under Dr. Cliffton Asters. -He has completed one year adjuvant Nivo in Nov 2023, tolerated very well  -Surveillance CT scan from Mar 20, 2023 showed no evidence of recurrence, the irregular nodular consolidation in the anterior right lower lobe is likely infectious, probable from aspiration pneumonia, he is asymptomatic.  Will continue follow-up -He is clinically doing well overall, asymptomatic, weight is stable, no clinical concern for recurrence. -Continue cancer surveillance, will repeat CT scan in 3 months     PLAN: -lab reviewed -CMP-pending - CT Scan q6 months -Repeat CT scan in 3 months -lab/flush and CT scan f/u in 3 months  SUMMARY OF ONCOLOGIC HISTORY: Oncology History Overview Note   Cancer Staging  Malignant neoplasm of overlapping sites of esophagus [facility]) Staging form: Esophagus - Adenocarcinoma, AJCC 8th Edition - Clinical stage from 05/13/2021: Stage IVA (cT3, cN2, cM0) - Signed by Malachy Mood, MD on 05/22/2021    Malignant neoplasm of overlapping sites of esophagus (HCC)  04/18/2021 Imaging   ULTRASOUND OF HEAD/NECK SOFT TISSUES  IMPRESSION: No suspicious lymphadenopathy   04/19/2021 Procedure   EGD  Impression: - Esophageal  polyp(s) were found. Biopsied. - Partially obstructing, likely malignant esophageal tumor was found in the distal esophagus. - Small hiatal hernia. - A single gastric polyp. Biopsied. - Normal duodenal bulb, first portion of the duodenum and second portion of the duodenum.   04/19/2021 Pathology Results   FINAL MICROSCOPIC DIAGNOSIS: Stomach, Biopsy - FUNDIC GLAND POLYP. Negative for dysplasia. NO Helicobacter pylori organisms seen on H and E stain. Esophagus- Distal, Biopsy - INVASIVE MODERATELY DIFFERENTIATED ADENOCARCINOMA OF THE ESOPHAGUS.  - HER2 Study by Immunostain: Negative (score 1+) Esophagus- Mid, Biopsy - INVASIVE MODERATELY DIFFERENTIATED ADENOCARCINOMA OF THE ESOPHAGUS. - HER2 Study by Immunostain: Negative (score 1+)   05/05/2021 Imaging   CT CAP  IMPRESSION: 1. The patient's known esophageal mass is not well visualized. There is mild irregular wall thickening of the distal esophagus. Correlate with previous clinical evaluation. 2. Indeterminate mildly enlarged high right paratracheal node near the thoracic inlet. No other mediastinal adenopathy. 3. No evidence of metastatic disease in the abdomen or pelvis. 4. Stable small pulmonary nodules bilaterally consistent with benign findings. 5. Cholelithiasis, small renal cysts and Aortic Atherosclerosis (ICD10-I70.0).   05/09/2021 Initial Diagnosis   Malignant neoplasm of overlapping sites of esophagus (HCC)   05/11/2021 Procedure   Upper EUS  Impression: - Mucosal nodule found in the esophagus. - Partially obstructing, malignant esophageal tumor was found at the gastroesophageal junction. - At least 3 discrete peritumoral lymph nodes - A mass was found in the gastroesophageal junction. A tissue diagnosis was obtained prior to this exam. This is of adenocarcinoma. This was staged T3 N2 Mx by endosonographic criteria. - Unable to traverse GE junction with either EGD scope or  radial EUS scope.   05/13/2021 Cancer Staging    Staging form: Esophagus - Adenocarcinoma, AJCC 8th Edition - Clinical stage from 05/13/2021: Stage IVA (cT3, cN2, cM0) - Signed by Malachy Mood, MD on 05/22/2021   05/16/2021 PET scan   IMPRESSION: 1. Distal esophageal mass measuring about 6.2 cm in length, maximum SUV 13.1. 2. Mildly hypermetabolic and mildly enlarged upper right paratracheal lymph node, 1.3 cm in short axis with maximum SUV 3.6, concerning for malignant involvement. 3. Hypodense 1.1 cm right thyroid nodule has a maximum SUV substantially above that of the background thyroid. A significant minority of such nodules can harbor thyroid cancer. Recommend thyroid US and biopsy (ref: J Am Coll Radiol. 2015 Feb;12(2): 143-50). 4. 3 by 4 mm right lower lobe pulmonary nodule is similar to 05/05/2021, not hypermetabolic but below sensitive PET-CT size thresholds. Surveillance recommended. 5. Other imaging findings of potential clinical significance: Chronic left maxillary sinusitis. Aortic Atherosclerosis (ICD10-I70.0). Cholelithiasis. Prostatomegaly.   05/24/2021 - 06/13/2021 Chemotherapy         05/26/2021 Pathology Results   FINAL MICROSCOPIC DIAGNOSIS:   A. LYMPH NODE, 2R, FINE NEEDLE ASPIRATION:  - Malignant cells consistent with non-small cell carcinoma   COMMENT:  The cells are positive for cytokeratin 20 and CDX-2 (patchy). TTF-1, NapsinA, cytokeratin 5/6, p40, S100, MelanA, and cytokeratin 7 are negative. The patient's history of esophageal adenocarcinoma is noted and CDX-2 positivity is consistent with a gastrointestinal primary. This cytokeratin7/20 profile is usually seen in lower tract tumors, but this pattern can be seen in gastric/esophageal cases.   05/27/2021 - 07/04/2021 Radiation Therapy   Per Dr. Basilio Cairo   06/20/2021 - 08/08/2021 Chemotherapy   Patient is on Treatment Plan : GASTROESOPHAGEAL FOLFOX q14d x 6 cycles     08/04/2021 PET scan   IMPRESSION: 1. Interval development of a new 6 mm short axis right  cervical node with hypermetabolism. Finding is somewhat indeterminate given the apparent response of the hypermetabolic right paratracheal lymphadenopathy seen on the previous study. Close follow-up recommended as metastatic disease not excluded. 2. Diffuse hypermetabolic FDG accumulation noted in the esophagus today with more diffuse circumferential esophageal wall thickening on today's study than on the previous exam. Hypermetabolism in the distal esophagus at the level of the hypermetabolic neoplasm previously has decreased from SUV max = 13 to SUV max = 7 today. 3. Stable tiny 3-4 mm right lower lobe and left upper lobe pulmonary nodules. Continued attention on follow-up recommended. 4. Cholelithiasis. 5. Emphysema.  (ICD10-J43.9) 6.  Aortic Atherosclerois (ICD10-170.0)   08/17/2021 Pathology Results   FINAL MICROSCOPIC DIAGNOSIS:  A. LYMPH NODE, RIGHT CERVICAL, FINE NEEDLE ASPIRATION:  - No malignant cells identified  - See comment   COMMENT:  There is scant cellularity and the specimen may not be representative.    09/05/2021 Definitive Surgery   FINAL MICROSCOPIC DIAGNOSIS:   A. ESOPHAGOGASTRECTOMY:  - Invasive poorly differentiated adenocarcinoma.  - Metastatic carcinoma involving 5 of 13 lymph nodes (5/13).  - See oncology table below.   B. LYMPH NODE, LEVEL 8, EXCISION:  - One lymph node, negative for malignancy (0/1).   09/05/2021 Cancer Staging   Staging form: Esophagus - Adenocarcinoma, AJCC 8th Edition - Pathologic stage from 09/05/2021: Stage IIIB (ypT3, pN2, cM0, G3) - Signed by Malachy Mood, MD on 12/03/2021 Stage prefix: Post-therapy Response to neoadjuvant therapy: Partial response Histologic grading system: 3 grade system Residual tumor (R): R0 - None   10/10/2021 - 06/19/2022 Chemotherapy   Patient is on Treatment Plan :  GASTROESOPHAGEAL Nivolumab q14d x 8 cycles / Nivolumab q28d     10/10/2021 -  Chemotherapy   Patient is on Treatment Plan : GASTROESOPHAGEAL  Nivolumab (240) q14d x 8 cycles / Nivolumab (480) q28d     12/30/2021 Imaging   EXAM: CT CHEST, ABDOMEN, AND PELVIS WITH CONTRAST  IMPRESSION: Status post esophagectomy with gastric pull-through procedure. No evidence of recurrent or metastatic carcinoma within the chest, abdomen, or pelvis.   Cholelithiasis. No radiographic evidence of cholecystitis.   Stable enlarged prostate.   Aortic Atherosclerosis (ICD10-I70.0).      INTERVAL HISTORY:  Nathaniel Jennings is here for a follow up of  esophagus cancer. He was last seen by me on 03/22/19. He presents to the clinic accompanied by wife. Pt state that he has been doing well. Pt also state that he has some acid reflux and he take Omeprazole.     All other systems were reviewed with the patient and are negative.  MEDICAL HISTORY:  Past Medical History:  Diagnosis Date   Arthritis    BPH (benign prostatic hyperplasia)    Dyslipidemia    Elevated PSA    Esophageal cancer (HCC)    Full dentures    GERD (gastroesophageal reflux disease)    History of melanoma excision    07/ 2011  s/p  wide excision left back--- per pathology-- negative maligant --  mild melanocytic atypia   History of palpitations    cardiologist --  dr Nicki Guadalajara (last note in epic 2014   Hypertension    Sleep apnea     SURGICAL HISTORY: Past Surgical History:  Procedure Laterality Date   COLONOSCOPY  10/24/2007   ESOPHAGOGASTRODUODENOSCOPY N/A 09/05/2021   Procedure: ESOPHAGOGASTRODUODENOSCOPY (EGD);  Surgeon: Corliss Skains, MD;  Location: [facility] OR;  Service: Thoracic;  Laterality: N/A;   ESOPHAGOGASTRODUODENOSCOPY (EGD) WITH PROPOFOL N/A 05/11/2021   Procedure: ESOPHAGOGASTRODUODENOSCOPY (EGD) WITH PROPOFOL;  Surgeon: Willis Modena, MD;  Location: [facility] ENDOSCOPY;  Service: Endoscopy;  Laterality: N/A;   EUS N/A 05/11/2021   Procedure: ESOPHAGEAL ENDOSCOPIC ULTRASOUND (EUS) RADIAL;  Surgeon: Willis Modena, MD;  Location: [facility]COPY;  Service:  Endoscopy;  Laterality: N/A;   EYE SURGERY     Lasik surgery on both eyes, but still needs glasses   FINE NEEDLE ASPIRATION  05/26/2021   Procedure: FINE NEEDLE ASPIRATION (FNA) LINEAR;  Surgeon: Josephine Igo, DO;  Location: [facility] ENDOSCOPY;  Service: Pulmonary;;   INTERCOSTAL NERVE BLOCK  09/05/2021   Procedure: INTERCOSTAL NERVE BLOCK;  Surgeon: Corliss Skains, MD;  Location: [facility] OR;  Service: Thoracic;;   IR IMAGING GUIDED PORT INSERTION  06/30/2021   JEJUNOSTOMY  09/05/2021   Procedure: Rance Muir;  Surgeon: Corliss Skains, MD;  Location: [facility] OR;  Service: Thoracic;;   PROSTATE BIOPSY N/A 07/02/2015   Procedure: BIOPSY TRANSRECTAL ULTRASONIC PROSTATE (TUBP);  Surgeon: Su Grand, MD;  Location: [facility];  Service: Urology;  Laterality: N/A;   PROSTATE BIOPSY N/A 11/22/2015   Procedure: BIOPSY TRANSRECTAL ULTRASONIC PROSTATE (TUBP);  Surgeon: Malen Gauze, MD;  Location: [facility];  Service: Urology;  Laterality: N/A;   TRANSTHORACIC ECHOCARDIOGRAM  11/19/2012   normal echo/  trivial TR   VIDEO BRONCHOSCOPY WITH ENDOBRONCHIAL ULTRASOUND N/A 05/26/2021   Procedure: VIDEO BRONCHOSCOPY WITH ENDOBRONCHIAL ULTRASOUND;  Surgeon: Josephine Igo, DO;  Location: [facility];  Service: Pulmonary;  Laterality: N/A;   WIDE EXCISION MELANOMA LEFT BACK  05/02/2010    I have reviewed the social history  and family history with the patient and they are unchanged from previous note.  ALLERGIES:  is allergic to bee venom.  MEDICATIONS:  Current Outpatient Medications  Medication Sig Dispense Refill   aspirin EC 81 MG tablet Take 81 mg by mouth in the morning.     ketoconazole (NIZORAL) 2 % cream APPLY 1 APPLICATION TOPICALLY DAILY 15 g 0   metoprolol succinate (TOPROL-XL) 50 MG 24 hr tablet Take 50 mg by mouth daily. Take with or immediately following a meal.     Multiple Vitamin (MULTIVITAMIN WITH MINERALS) TABS tablet Take 1 tablet by mouth daily.      omeprazole (PRILOSEC) 40 MG capsule Take 40 mg by mouth 2 (two) times daily.     Skin Protectants, Misc. (EUCERIN) cream Apply 1 application. topically daily as needed for dry skin.     No current facility-administered medications for this visit.    PHYSICAL EXAMINATION: ECOG PERFORMANCE STATUS: 0 - Asymptomatic  Vitals:   06/14/23 1354  BP: (!) 153/91  Pulse: 69  Resp: 18  Temp: 97.7 F (36.5 C)  SpO2: 98%   Wt Readings from Last 3 Encounters:  06/14/23 165 lb 6.4 oz (75 kg)  03/22/23 163 lb (73.9 kg)  12/21/22 164 lb 4.8 oz (74.5 kg)     GENERAL:alert, no distress and comfortable SKIN: skin color normal, no rashes or significant lesions EYES: normal, Conjunctiva are pink and non-injected, sclera clear  NEURO: alert & oriented x 3 with fluent speech LUNGS: (-)clear to auscultation and percussion with (-) normal breathing effort HEART: (-)regular rate & rhythm and no murmurs and(-) no lower extremity edema ABDOMEN:(-)abdomen soft, (+)tender  where the incision from surgery and normal bowel sounds LABORATORY DATA:  I have reviewed the data as listed    Latest Ref Rng & Units 06/14/2023    1:10 PM 03/20/2023   11:30 AM 12/21/2022   12:11 PM  CBC  WBC 4.0 - 10.5 K/uL 4.9  4.8  4.3   Hemoglobin 13.0 - 17.0 g/dL 16.1  09.6  04.5   Hematocrit 39.0 - 52.0 % 36.4  36.7  35.9   Platelets 150 - 400 K/uL 195  171  156         Latest Ref Rng & Units 06/14/2023    1:10 PM 03/20/2023   11:30 AM 12/21/2022   12:11 PM  CMP  Glucose 70 - 99 mg/dL 75  98  88   BUN 8 - 23 mg/dL 14  15  12    Creatinine 0.61 - 1.24 mg/dL 4.09  8.11  9.14   Sodium 135 - 145 mmol/L 141  138  139   Potassium 3.5 - 5.1 mmol/L 3.9  4.1  3.6   Chloride 98 - 111 mmol/L 107  103  105   CO2 22 - 32 mmol/L 28  28  28    Calcium 8.9 - 10.3 mg/dL 9.0  9.3  8.5   Total Protein 6.5 - 8.1 g/dL 7.0  6.9  6.5   Total Bilirubin 0.3 - 1.2 mg/dL 0.4  0.5  0.5   Alkaline Phos 38 - 126 U/L 62  65  68   AST 15 - 41  U/L 17  16  16    ALT 0 - 44 U/L 15  14  16        RADIOGRAPHIC STUDIES: I have personally reviewed the radiological images as listed and agreed with the findings in the report. No results found.    Orders Placed This  Encounter  Procedures   CT CHEST ABDOMEN PELVIS W CONTRAST    Standing Status:   Future    Standing Expiration Date:   06/13/2024    Order Specific Question:   If indicated for the ordered procedure, I authorize the administration of contrast media per Radiology protocol    Answer:   Yes    Order Specific Question:   Does the patient have a contrast media/X-ray dye allergy?    Answer:   No    Order Specific Question:   Preferred imaging location?    Answer:   [facility]    Order Specific Question:   If indicated for the ordered procedure, I authorize the administration of oral contrast media per Radiology protocol    Answer:   Yes   All questions were answered. The patient knows to call the clinic with any problems, questions or concerns. No barriers to learning was detected. The total time spent in the appointment was 25 minutes.     Malachy Mood, MD 06/14/2023   Carolin Coy, CMA, am acting as scribe for Malachy Mood, MD.   I have reviewed the above documentation for accuracy and completeness, and I agree with the above.

## 2023-06-14 NOTE — Assessment & Plan Note (Signed)
yp(T3, N2) with upper mediastinal adenopathy  -diagnosed in 03/2021 -s/p concurrent chemoRT 05/24/21 - 07/04/21. He started with chemo TC then switched to FOLFOX on 06/20/21. He completed 4 cycles on 08/08/21. Posttreatment PET scan showed excellent response. -s/p esophagogastrectomy on 09/05/21 under Dr. Cliffton Asters. -He has completed one year adjuvant Nivo in Nov 2023, tolerated very well  -Surveillance CT scan from Mar 20, 2023 showed no evidence of recurrence, the irregular nodular consolidation in the anterior right lower lobe is likely infectious, probable from aspiration pneumonia, he is asymptomatic.  Will continue follow-up -He is clinically doing well overall, asymptomatic, weight is stable, no clinical concern for recurrence. -Continue cancer surveillance, will repeat CT scan in 3 months

## 2023-06-15 ENCOUNTER — Other Ambulatory Visit: Payer: Self-pay

## 2023-07-26 ENCOUNTER — Inpatient Hospital Stay: Payer: Commercial Managed Care - PPO | Attending: Hematology

## 2023-07-26 DIAGNOSIS — E860 Dehydration: Secondary | ICD-10-CM

## 2023-07-26 DIAGNOSIS — Z8501 Personal history of malignant neoplasm of esophagus: Secondary | ICD-10-CM | POA: Diagnosis present

## 2023-07-26 DIAGNOSIS — Z95828 Presence of other vascular implants and grafts: Secondary | ICD-10-CM

## 2023-07-26 MED ORDER — SODIUM CHLORIDE 0.9% FLUSH
10.0000 mL | Freq: Once | INTRAVENOUS | Status: AC
  Administered 2023-07-26: 10 mL

## 2023-07-26 MED ORDER — HEPARIN SOD (PORK) LOCK FLUSH 100 UNIT/ML IV SOLN
500.0000 [IU] | Freq: Once | INTRAVENOUS | Status: AC
  Administered 2023-07-26: 500 [IU]

## 2023-07-29 IMAGING — XA IR PICC >5YO
1 series · 1 of 1 positions shown · IV contrast (agent unspecified)
Comparison: none

INDICATION: History of esophageal cancer.  Request for PICC line placement.

EXAM:
ULTRASOUND AND FLUOROSCOPIC GUIDED PICC LINE INSERTION
MEDICATIONS:
1% lidocaine
CONTRAST:  None
FLUOROSCOPY TIME:  30 seconds (2 mGy)
COMPLICATIONS:
None immediate.
TECHNIQUE: The procedure, risks, benefits, and alternatives were explained to
the patient and informed written consent was obtained.

[Series 300: line placements · 1 of 1 slices shown]
[im 1/1]
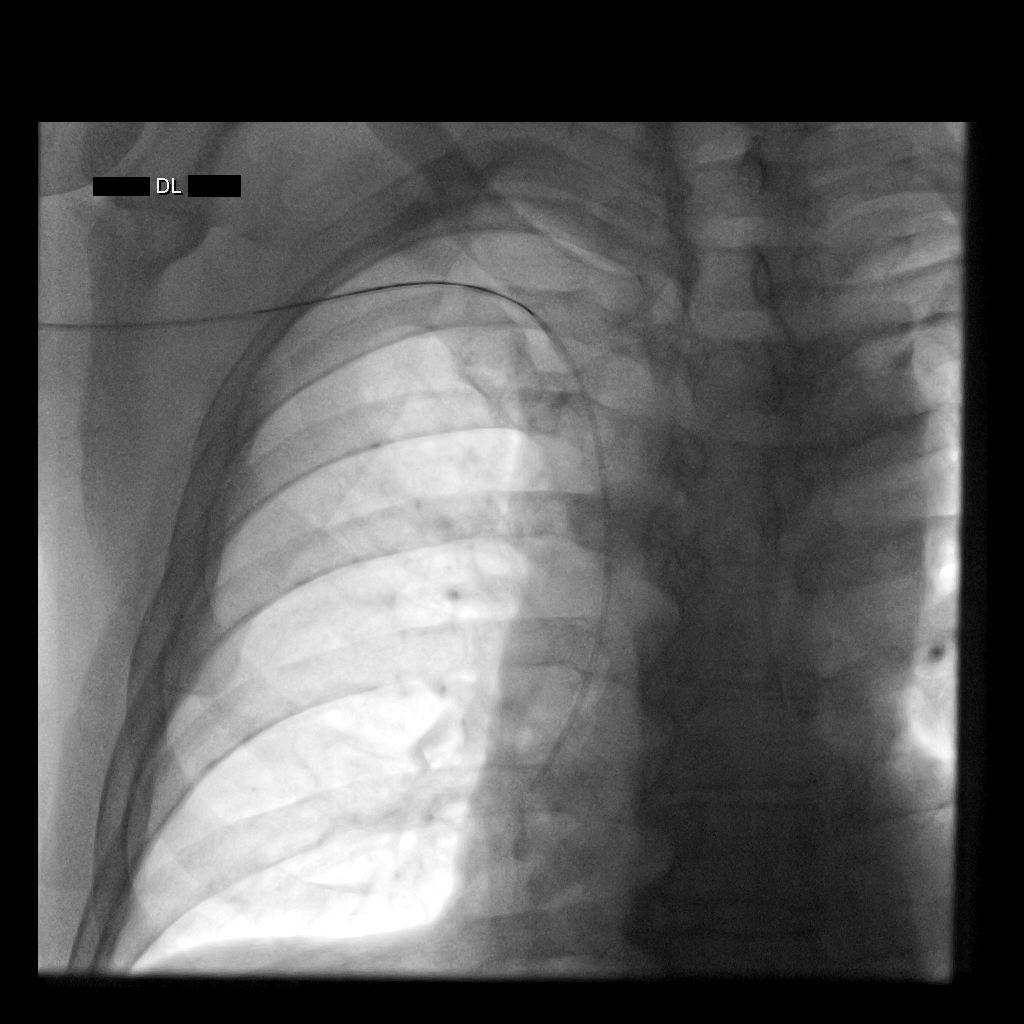

[1 of 1 positions shown; findings below may reference images not displayed]

The right upper extremity was prepped with chlorhexidine in a
sterile fashion, and a sterile drape was applied covering the
operative field. Maximum barrier sterile technique with sterile
gowns and gloves were used for the procedure. A timeout was
performed prior to the initiation of the procedure. Local anesthesia
was provided with 1% lidocaine.

After the overlying soft tissues were anesthetized with 1%
lidocaine, a micropuncture kit was utilized to access the right
brachial vein. Real-time ultrasound guidance was utilized for
vascular access including the acquisition of a permanent ultrasound
image documenting patency of the accessed vessel.

A guidewire was advanced to the level of the superior caval-atrial
junction for measurement purposes and the PICC line was cut to
length. A peel-away sheath was placed and a 34 cm, 5 French, dual
lumen was inserted to level of the superior caval-atrial junction. A
post procedure spot fluoroscopic was obtained. The catheter easily
aspirated and flushed and was secured in place. A dressing was
placed. The patient tolerated the procedure well without immediate
post procedural complication.
FINDINGS: After catheter placement, the tip lies within the superior
cavoatrial junction. The catheter aspirates and flushes normally and
is ready for immediate use.
IMPRESSION: Successful ultrasound and fluoroscopic guided placement of a right
brachial vein approach, 34 cm, 5 French, dual lumen PICC with tip at
the superior caval-atrial junction. The PICC line is ready for
immediate use.

## 2023-08-27 ENCOUNTER — Encounter: Payer: Self-pay | Admitting: Hematology

## 2023-08-28 DIAGNOSIS — Z08 Encounter for follow-up examination after completed treatment for malignant neoplasm: Secondary | ICD-10-CM | POA: Diagnosis not present

## 2023-08-28 DIAGNOSIS — L814 Other melanin hyperpigmentation: Secondary | ICD-10-CM | POA: Diagnosis not present

## 2023-08-28 DIAGNOSIS — L821 Other seborrheic keratosis: Secondary | ICD-10-CM | POA: Diagnosis not present

## 2023-08-28 DIAGNOSIS — Z85820 Personal history of malignant melanoma of skin: Secondary | ICD-10-CM | POA: Diagnosis not present

## 2023-08-28 DIAGNOSIS — D225 Melanocytic nevi of trunk: Secondary | ICD-10-CM | POA: Diagnosis not present

## 2023-09-03 ENCOUNTER — Encounter: Payer: Self-pay | Admitting: Hematology

## 2023-09-10 ENCOUNTER — Inpatient Hospital Stay: Payer: Medicare HMO | Attending: Hematology

## 2023-09-10 ENCOUNTER — Ambulatory Visit (HOSPITAL_COMMUNITY)
Admission: RE | Admit: 2023-09-10 | Discharge: 2023-09-10 | Disposition: A | Discharge status: Home / Self Care | Payer: Medicare HMO | Source: Ambulatory Visit | Attending: Hematology | Admitting: Hematology

## 2023-09-10 ENCOUNTER — Inpatient Hospital Stay: Payer: Medicare HMO

## 2023-09-10 ENCOUNTER — Encounter: Payer: Self-pay | Admitting: Hematology

## 2023-09-10 DIAGNOSIS — Z79899 Other long term (current) drug therapy: Secondary | ICD-10-CM | POA: Insufficient documentation

## 2023-09-10 DIAGNOSIS — K5730 Diverticulosis of large intestine without perforation or abscess without bleeding: Secondary | ICD-10-CM | POA: Diagnosis not present

## 2023-09-10 DIAGNOSIS — C158 Malignant neoplasm of overlapping sites of esophagus: Secondary | ICD-10-CM

## 2023-09-10 DIAGNOSIS — K8020 Calculus of gallbladder without cholecystitis without obstruction: Secondary | ICD-10-CM | POA: Diagnosis not present

## 2023-09-10 DIAGNOSIS — I700 Atherosclerosis of aorta: Secondary | ICD-10-CM | POA: Diagnosis not present

## 2023-09-10 DIAGNOSIS — K219 Gastro-esophageal reflux disease without esophagitis: Secondary | ICD-10-CM | POA: Insufficient documentation

## 2023-09-10 DIAGNOSIS — Z9221 Personal history of antineoplastic chemotherapy: Secondary | ICD-10-CM | POA: Insufficient documentation

## 2023-09-10 DIAGNOSIS — Z8501 Personal history of malignant neoplasm of esophagus: Secondary | ICD-10-CM | POA: Insufficient documentation

## 2023-09-10 LAB — CBC WITH DIFFERENTIAL (CANCER CENTER ONLY)
Abs Immature Granulocytes: 0.03 10*3/uL (ref 0.00–0.07)
Basophils Absolute: 0.1 10*3/uL (ref 0.0–0.1)
Basophils Relative: 2 %
Eosinophils Absolute: 0.2 10*3/uL (ref 0.0–0.5)
Eosinophils Relative: 3 %
HCT: 37.6 % — ABNORMAL LOW (ref 39.0–52.0)
Hemoglobin: 12.6 g/dL — ABNORMAL LOW (ref 13.0–17.0)
Immature Granulocytes: 1 %
Lymphocytes Relative: 23 %
Lymphs Abs: 1.1 10*3/uL (ref 0.7–4.0)
MCH: 30.4 pg (ref 26.0–34.0)
MCHC: 33.5 g/dL (ref 30.0–36.0)
MCV: 90.6 fL (ref 80.0–100.0)
Monocytes Absolute: 0.5 10*3/uL (ref 0.1–1.0)
Monocytes Relative: 12 %
Neutro Abs: 2.8 10*3/uL (ref 1.7–7.7)
Neutrophils Relative %: 59 %
Platelet Count: 187 10*3/uL (ref 150–400)
RBC: 4.15 MIL/uL — ABNORMAL LOW (ref 4.22–5.81)
RDW: 13.9 % (ref 11.5–15.5)
WBC Count: 4.6 10*3/uL (ref 4.0–10.5)
nRBC: 0 % (ref 0.0–0.2)

## 2023-09-10 LAB — CMP (CANCER CENTER ONLY)
ALT: 20 U/L (ref 0–44)
AST: 18 U/L (ref 15–41)
Albumin: 4.2 g/dL (ref 3.5–5.0)
Alkaline Phosphatase: 54 U/L (ref 38–126)
Anion gap: 6 (ref 5–15)
BUN: 15 mg/dL (ref 8–23)
CO2: 28 mmol/L (ref 22–32)
Calcium: 10.1 mg/dL (ref 8.9–10.3)
Chloride: 104 mmol/L (ref 98–111)
Creatinine: 1 mg/dL (ref 0.61–1.24)
GFR, Estimated: >60 mL/min (ref >60)
Glucose, Bld: 100 mg/dL — ABNORMAL HIGH (ref 70–99)
Potassium: 4.1 mmol/L (ref 3.5–5.1)
Sodium: 138 mmol/L (ref 135–145)
Total Bilirubin: 0.5 mg/dL (ref <1.2)
Total Protein: 6.9 g/dL (ref 6.5–8.1)

## 2023-09-10 MED ORDER — HEPARIN SOD (PORK) LOCK FLUSH 100 UNIT/ML IV SOLN
500.0000 [IU] | Freq: Once | INTRAVENOUS | Status: AC
  Administered 2023-09-10: 500 [IU] via INTRAVENOUS

## 2023-09-10 MED ORDER — HEPARIN SOD (PORK) LOCK FLUSH 100 UNIT/ML IV SOLN
INTRAVENOUS | Status: AC
  Filled 2023-09-10: qty 5

## 2023-09-10 MED ORDER — IOHEXOL 300 MG/ML  SOLN
75.0000 mL | Freq: Once | INTRAMUSCULAR | Status: AC | PRN
  Administered 2023-09-10: 100 mL via INTRAVENOUS

## 2023-09-13 ENCOUNTER — Encounter: Payer: Self-pay | Admitting: Hematology

## 2023-09-13 NOTE — Telephone Encounter (Signed)
Telephone call  

## 2023-09-16 NOTE — Assessment & Plan Note (Signed)
yp(T3, N2) with upper mediastinal adenopathy  -diagnosed in 03/2021 -s/p concurrent chemoRT 05/24/21 - 07/04/21. He started with chemo TC then switched to FOLFOX on 06/20/21. He completed 4 cycles on 08/08/21. Posttreatment PET scan showed excellent response. -s/p esophagogastrectomy on 09/05/21 under Dr. Cliffton Asters. -He has completed one year adjuvant Nivo in Nov 2023, tolerated very well  -Surveillance CT scan from Mar 20, 2023 showed no evidence of recurrence, the irregular nodular consolidation in the anterior right lower lobe is likely infectious, probable from aspiration pneumonia, he is asymptomatic.  Will continue follow-up --surveillance CT scan on September 10, 2023 showed no evidence of recurrence -Continue cancer surveillance

## 2023-09-17 ENCOUNTER — Inpatient Hospital Stay: Payer: Medicare HMO | Admitting: Hematology

## 2023-09-17 VITALS — BP 142/81 | HR 71 | Temp 98.3°F | Resp 18 | Ht 66.0 in | Wt 169.1 lb

## 2023-09-17 DIAGNOSIS — Z79899 Other long term (current) drug therapy: Secondary | ICD-10-CM | POA: Diagnosis not present

## 2023-09-17 DIAGNOSIS — C158 Malignant neoplasm of overlapping sites of esophagus: Secondary | ICD-10-CM | POA: Diagnosis not present

## 2023-09-17 DIAGNOSIS — Z9221 Personal history of antineoplastic chemotherapy: Secondary | ICD-10-CM | POA: Diagnosis not present

## 2023-09-17 DIAGNOSIS — Z8501 Personal history of malignant neoplasm of esophagus: Secondary | ICD-10-CM | POA: Diagnosis not present

## 2023-09-17 DIAGNOSIS — K219 Gastro-esophageal reflux disease without esophagitis: Secondary | ICD-10-CM | POA: Diagnosis not present

## 2023-09-17 NOTE — Progress Notes (Signed)
[facility]   [phone] [phone]   Clinic Follow up Note   Patient Care Team: Lonie Peak, Cordelia Poche as PCP - General (Physician Assistant) Malachy Mood, MD as Consulting Physician (Oncology)  Date of Service:  09/17/2023  CHIEF COMPLAINT: f/u of esophageal cancer  CURRENT THERAPY:  Cancer surveillance  Oncology History   Malignant neoplasm of overlapping sites of esophagus (HCC) yp(T3, N2) with upper mediastinal adenopathy  -diagnosed in 03/2021 -s/p concurrent chemoRT 05/24/21 - 07/04/21. He started with chemo TC then switched to FOLFOX on 06/20/21. He completed 4 cycles on 08/08/21. Posttreatment PET scan showed excellent response. -s/p esophagogastrectomy on 09/05/21 under Dr. Cliffton Asters. -He has completed one year adjuvant Nivo in Nov 2023, tolerated very well  -Surveillance CT scan from Mar 20, 2023 showed no evidence of recurrence, the irregular nodular consolidation in the anterior right lower lobe is likely infectious, probable from aspiration pneumonia, he is asymptomatic.  Will continue follow-up --surveillance CT scan on September 10, 2023 showed no evidence of recurrence -Continue cancer surveillance    Assessment and Plan    Esophageal Cancer Follow-up for esophageal cancer diagnosed in June 2022. Currently asymptomatic with no signs of recurrence. Labs from last week show normal blood counts, kidney, and liver function. Recent imaging shows only surgical changes. Highest risk of recurrence is within the first three years post-diagnosis, currently 2.5 years post-diagnosis. Discussed that after three years, the risk of recurrence significantly decreases. - Order scan in six months (summer 2025) - Schedule follow-up visit in three months - Order labs at next visit - After next scan, follow-up every 4-6 months  Gastroesophageal Reflux Disease (GERD) Experiencing acid reflux, managed with pantoprazole 40 mg. Severe attacks approximately  once every 2-3 months. Sleeps in a recliner to mitigate symptoms. Advised to avoid lying down after meals and to eat smaller, more frequent meals to reduce symptoms. Discussed risk of aspiration due to part of the stomach being in the chest. - Continue pantoprazole 40 mg - Advise to avoid lying down after meals - Recommend smaller, more frequent meals  Port Management Port in place from previous chemotherapy. It has been 2.5 years since placement, and considering removal. Recently changed insurance to Medicare Gold Plus. - Request port removal by interventional radiology - Provide contact number for scheduling the procedure - Advise to follow up if not contacted for scheduling  PLAN -Lab and CT scan reviewed, ED -Port removal by IR in the next months -Lab and follow-up in 3 months, will order restaging CT scan at next visit     SUMMARY OF ONCOLOGIC HISTORY: Oncology History Overview Note   Cancer Staging  Malignant neoplasm of overlapping sites of esophagus [facility]) Staging form: Esophagus - Adenocarcinoma, AJCC 8th Edition - Clinical stage from 05/13/2021: Stage IVA (cT3, cN2, cM0) - Signed by Malachy Mood, MD on 05/22/2021    Malignant neoplasm of overlapping sites of esophagus (HCC)  04/18/2021 Imaging   ULTRASOUND OF HEAD/NECK SOFT TISSUES  IMPRESSION: No suspicious lymphadenopathy   04/19/2021 Procedure   EGD  Impression: - Esophageal polyp(s) were found. Biopsied. - Partially obstructing, likely malignant esophageal tumor was found in the distal esophagus. - Small hiatal hernia. - A single gastric polyp. Biopsied. - Normal duodenal bulb, first portion of the duodenum and second portion of the duodenum.   04/19/2021 Pathology Results   FINAL MICROSCOPIC DIAGNOSIS: Stomach, Biopsy - FUNDIC GLAND POLYP. Negative for dysplasia. NO Helicobacter pylori organisms seen on H and E stain. Esophagus- Distal, Biopsy -  INVASIVE MODERATELY DIFFERENTIATED ADENOCARCINOMA OF THE ESOPHAGUS.   - HER2 Study by Immunostain: Negative (score 1+) Esophagus- Mid, Biopsy - INVASIVE MODERATELY DIFFERENTIATED ADENOCARCINOMA OF THE ESOPHAGUS. - HER2 Study by Immunostain: Negative (score 1+)   05/05/2021 Imaging   CT CAP  IMPRESSION: 1. The patient's known esophageal mass is not well visualized. There is mild irregular wall thickening of the distal esophagus. Correlate with previous clinical evaluation. 2. Indeterminate mildly enlarged high right paratracheal node near the thoracic inlet. No other mediastinal adenopathy. 3. No evidence of metastatic disease in the abdomen or pelvis. 4. Stable small pulmonary nodules bilaterally consistent with benign findings. 5. Cholelithiasis, small renal cysts and Aortic Atherosclerosis (ICD10-I70.0).   05/09/2021 Initial Diagnosis   Malignant neoplasm of overlapping sites of esophagus (HCC)   05/11/2021 Procedure   Upper EUS  Impression: - Mucosal nodule found in the esophagus. - Partially obstructing, malignant esophageal tumor was found at the gastroesophageal junction. - At least 3 discrete peritumoral lymph nodes - A mass was found in the gastroesophageal junction. A tissue diagnosis was obtained prior to this exam. This is of adenocarcinoma. This was staged T3 N2 Mx by endosonographic criteria. - Unable to traverse GE junction with either EGD scope or radial EUS scope.   05/13/2021 Cancer Staging   Staging form: Esophagus - Adenocarcinoma, AJCC 8th Edition - Clinical stage from 05/13/2021: Stage IVA (cT3, cN2, cM0) - Signed by Malachy Mood, MD on 05/22/2021   05/16/2021 PET scan   IMPRESSION: 1. Distal esophageal mass measuring about 6.2 cm in length, maximum SUV 13.1. 2. Mildly hypermetabolic and mildly enlarged upper right paratracheal lymph node, 1.3 cm in short axis with maximum SUV 3.6, concerning for malignant involvement. 3. Hypodense 1.1 cm right thyroid nodule has a maximum SUV substantially above that of the background thyroid. A  significant minority of such nodules can harbor thyroid cancer. Recommend thyroid US and biopsy (ref: J Am Coll Radiol. 2015 Feb;12(2): 143-50). 4. 3 by 4 mm right lower lobe pulmonary nodule is similar to 05/05/2021, not hypermetabolic but below sensitive PET-CT size thresholds. Surveillance recommended. 5. Other imaging findings of potential clinical significance: Chronic left maxillary sinusitis. Aortic Atherosclerosis (ICD10-I70.0). Cholelithiasis. Prostatomegaly.   05/24/2021 - 06/13/2021 Chemotherapy         05/26/2021 Pathology Results   FINAL MICROSCOPIC DIAGNOSIS:   A. LYMPH NODE, 2R, FINE NEEDLE ASPIRATION:  - Malignant cells consistent with non-small cell carcinoma   COMMENT:  The cells are positive for cytokeratin 20 and CDX-2 (patchy). TTF-1, NapsinA, cytokeratin 5/6, p40, S100, MelanA, and cytokeratin 7 are negative. The patient's history of esophageal adenocarcinoma is noted and CDX-2 positivity is consistent with a gastrointestinal primary. This cytokeratin7/20 profile is usually seen in lower tract tumors, but this pattern can be seen in gastric/esophageal cases.   05/27/2021 - 07/04/2021 Radiation Therapy   Per Dr. Basilio Cairo   06/20/2021 - 08/08/2021 Chemotherapy   Patient is on Treatment Plan : GASTROESOPHAGEAL FOLFOX q14d x 6 cycles     08/04/2021 PET scan   IMPRESSION: 1. Interval development of a new 6 mm short axis right cervical node with hypermetabolism. Finding is somewhat indeterminate given the apparent response of the hypermetabolic right paratracheal lymphadenopathy seen on the previous study. Close follow-up recommended as metastatic disease not excluded. 2. Diffuse hypermetabolic FDG accumulation noted in the esophagus today with more diffuse circumferential esophageal wall thickening on today's study than on the previous exam. Hypermetabolism in the distal esophagus at the level of the hypermetabolic neoplasm previously has  decreased from SUV max = 13 to SUV  max = 7 today. 3. Stable tiny 3-4 mm right lower lobe and left upper lobe pulmonary nodules. Continued attention on follow-up recommended. 4. Cholelithiasis. 5. Emphysema.  (ICD10-J43.9) 6.  Aortic Atherosclerois (ICD10-170.0)   08/17/2021 Pathology Results   FINAL MICROSCOPIC DIAGNOSIS:  A. LYMPH NODE, RIGHT CERVICAL, FINE NEEDLE ASPIRATION:  - No malignant cells identified  - See comment   COMMENT:  There is scant cellularity and the specimen may not be representative.    09/05/2021 Definitive Surgery   FINAL MICROSCOPIC DIAGNOSIS:   A. ESOPHAGOGASTRECTOMY:  - Invasive poorly differentiated adenocarcinoma.  - Metastatic carcinoma involving 5 of 13 lymph nodes (5/13).  - See oncology table below.   B. LYMPH NODE, LEVEL 8, EXCISION:  - One lymph node, negative for malignancy (0/1).   09/05/2021 Cancer Staging   Staging form: Esophagus - Adenocarcinoma, AJCC 8th Edition - Pathologic stage from 09/05/2021: Stage IIIB (ypT3, pN2, cM0, G3) - Signed by Malachy Mood, MD on 12/03/2021 Stage prefix: Post-therapy Response to neoadjuvant therapy: Partial response Histologic grading system: 3 grade system Residual tumor (R): R0 - None   10/10/2021 - 06/19/2022 Chemotherapy   Patient is on Treatment Plan : GASTROESOPHAGEAL Nivolumab q14d x 8 cycles / Nivolumab q28d     10/10/2021 -  Chemotherapy   Patient is on Treatment Plan : GASTROESOPHAGEAL Nivolumab (240) q14d x 8 cycles / Nivolumab (480) q28d     12/30/2021 Imaging   EXAM: CT CHEST, ABDOMEN, AND PELVIS WITH CONTRAST  IMPRESSION: Status post esophagectomy with gastric pull-through procedure. No evidence of recurrent or metastatic carcinoma within the chest, abdomen, or pelvis.   Cholelithiasis. No radiographic evidence of cholecystitis.   Stable enlarged prostate.   Aortic Atherosclerosis (ICD10-I70.0).      Discussed the use of AI scribe software for clinical note transcription with the patient, who gave verbal consent  to proceed.  History of Present Illness   A 67 year old patient with a history of esophageal cancer presents for a routine follow-up. The patient reports feeling well overall, with no residual problems from previous chemotherapy. He has been maintaining a good appetite and has gained some weight. The patient's energy levels fluctuate, but he remains active, engaging in walking for exercise. He denies any issues with swelling. The patient has been managing acid reflux with pantoprazole, which he reports is effective most of the time. However, he experiences severe episodes of reflux every two to three months. To manage this, the patient sleeps in a recliner, essentially in an upright position. He has noticed that drinking a lot of water seems to trigger these episodes. The patient retired recently and is currently looking for activities to keep him occupied.         All other systems were reviewed with the patient and are negative.  MEDICAL HISTORY:  Past Medical History:  Diagnosis Date   Arthritis    BPH (benign prostatic hyperplasia)    Dyslipidemia    Elevated PSA    Esophageal cancer (HCC)    Full dentures    GERD (gastroesophageal reflux disease)    History of melanoma excision    07/ 2011  s/p  wide excision left back--- per pathology-- negative maligant --  mild melanocytic atypia   History of palpitations    cardiologist --  dr Nicki Guadalajara (last note in epic 2014   Hypertension    Sleep apnea     SURGICAL HISTORY: Past Surgical History:  Procedure Laterality  Date   COLONOSCOPY  10/24/2007   ESOPHAGOGASTRODUODENOSCOPY N/A 09/05/2021   Procedure: ESOPHAGOGASTRODUODENOSCOPY (EGD);  Surgeon: Corliss Skains, MD;  Location: [facility] OR;  Service: Thoracic;  Laterality: N/A;   ESOPHAGOGASTRODUODENOSCOPY (EGD) WITH PROPOFOL N/A 05/11/2021   Procedure: ESOPHAGOGASTRODUODENOSCOPY (EGD) WITH PROPOFOL;  Surgeon: Willis Modena, MD;  Location: [facility] ENDOSCOPY;  Service: Endoscopy;   Laterality: N/A;   EUS N/A 05/11/2021   Procedure: ESOPHAGEAL ENDOSCOPIC ULTRASOUND (EUS) RADIAL;  Surgeon: Willis Modena, MD;  Location: [facility] ENDOSCOPY;  Service: Endoscopy;  Laterality: N/A;   EYE SURGERY     Lasik surgery on both eyes, but still needs glasses   FINE NEEDLE ASPIRATION  05/26/2021   Procedure: FINE NEEDLE ASPIRATION (FNA) LINEAR;  Surgeon: Josephine Igo, DO;  Location: [facility] ENDOSCOPY;  Service: Pulmonary;;   INTERCOSTAL NERVE BLOCK  09/05/2021   Procedure: INTERCOSTAL NERVE BLOCK;  Surgeon: Corliss Skains, MD;  Location: [facility] OR;  Service: Thoracic;;   IR IMAGING GUIDED PORT INSERTION  06/30/2021   JEJUNOSTOMY  09/05/2021   Procedure: Rance Muir;  Surgeon: Corliss Skains, MD;  Location: [facility] OR;  Service: Thoracic;;   PROSTATE BIOPSY N/A 07/02/2015   Procedure: BIOPSY TRANSRECTAL ULTRASONIC PROSTATE (TUBP);  Surgeon: Su Grand, MD;  Location: [facility];  Service: Urology;  Laterality: N/A;   PROSTATE BIOPSY N/A 11/22/2015   Procedure: BIOPSY TRANSRECTAL ULTRASONIC PROSTATE (TUBP);  Surgeon: Malen Gauze, MD;  Location: [facility];  Service: Urology;  Laterality: N/A;   TRANSTHORACIC ECHOCARDIOGRAM  11/19/2012   normal echo/  trivial TR   VIDEO BRONCHOSCOPY WITH ENDOBRONCHIAL ULTRASOUND N/A 05/26/2021   Procedure: VIDEO BRONCHOSCOPY WITH ENDOBRONCHIAL ULTRASOUND;  Surgeon: Josephine Igo, DO;  Location: [facility];  Service: Pulmonary;  Laterality: N/A;   WIDE EXCISION MELANOMA LEFT BACK  05/02/2010    I have reviewed the social history and family history with the patient and they are unchanged from previous note.  ALLERGIES:  is allergic to bee venom.  MEDICATIONS:  Current Outpatient Medications  Medication Sig Dispense Refill   aspirin EC 81 MG tablet Take 81 mg by mouth in the morning.     ketoconazole (NIZORAL) 2 % cream APPLY 1 APPLICATION TOPICALLY DAILY 15 g 0   metoprolol succinate (TOPROL-XL) 50 MG 24 hr tablet  Take 50 mg by mouth daily. Take with or immediately following a meal.     Multiple Vitamin (MULTIVITAMIN WITH MINERALS) TABS tablet Take 1 tablet by mouth daily.     omeprazole (PRILOSEC) 40 MG capsule Take 40 mg by mouth 2 (two) times daily.     Skin Protectants, Misc. (EUCERIN) cream Apply 1 application. topically daily as needed for dry skin.     No current facility-administered medications for this visit.    PHYSICAL EXAMINATION: ECOG PERFORMANCE STATUS: 0 - Asymptomatic  Vitals:   09/17/23 1415 09/17/23 1416  BP: (!) 163/82 (!) 142/81  Pulse: 71   Resp: 18   Temp: 98.3 F (36.8 C)   SpO2: 100%    Wt Readings from Last 3 Encounters:  09/17/23 169 lb 2 oz (76.7 kg)  06/14/23 165 lb 6.4 oz (75 kg)  03/22/23 163 lb (73.9 kg)     GENERAL:alert, no distress and comfortable SKIN: skin color, texture, turgor are normal, no rashes or significant lesions EYES: normal, Conjunctiva are pink and non-injected, sclera clear NECK: supple, thyroid normal size, non-tender, without nodularity LYMPH:  no palpable lymphadenopathy in the cervical, axillary  LUNGS: clear to auscultation and  percussion with normal breathing effort HEART: regular rate & rhythm and no murmurs and no lower extremity edema ABDOMEN:abdomen soft, non-tender and normal bowel sounds Musculoskeletal:no cyanosis of digits and no clubbing  NEURO: alert & oriented x 3 with fluent speech, no focal motor/sensory deficits   LABORATORY DATA:  I have reviewed the data as listed    Latest Ref Rng & Units 09/10/2023   12:28 PM 06/14/2023    1:10 PM 03/20/2023   11:30 AM  CBC  WBC 4.0 - 10.5 K/uL 4.6  4.9  4.8   Hemoglobin 13.0 - 17.0 g/dL 16.1  09.6  04.5   Hematocrit 39.0 - 52.0 % 37.6  36.4  36.7   Platelets 150 - 400 K/uL 187  195  171         Latest Ref Rng & Units 09/10/2023   12:28 PM 06/14/2023    1:10 PM 03/20/2023   11:30 AM  CMP  Glucose 70 - 99 mg/dL 409  75  98   BUN 8 - 23 mg/dL 15  14  15     Creatinine 0.61 - 1.24 mg/dL 8.11  9.14  7.82   Sodium 135 - 145 mmol/L 138  141  138   Potassium 3.5 - 5.1 mmol/L 4.1  3.9  4.1   Chloride 98 - 111 mmol/L 104  107  103   CO2 22 - 32 mmol/L 28  28  28    Calcium 8.9 - 10.3 mg/dL 95.6  9.0  9.3   Total Protein 6.5 - 8.1 g/dL 6.9  7.0  6.9   Total Bilirubin <1.2 mg/dL 0.5  0.4  0.5   Alkaline Phos 38 - 126 U/L 54  62  65   AST 15 - 41 U/L 18  17  16    ALT 0 - 44 U/L 20  15  14        RADIOGRAPHIC STUDIES: I have personally reviewed the radiological images as listed and agreed with the findings in the report. No results found.    Orders Placed This Encounter  Procedures   IR Removal Tun Access W/ Port W/O FL    Standing Status:   Future    Standing Expiration Date:   09/16/2024    Order Specific Question:   Reason for exam:    Answer:   NO NEED the port anymore    Order Specific Question:   Preferred Imaging Location?    Answer:   [facility]   All questions were answered. The patient knows to call the clinic with any problems, questions or concerns. No barriers to learning was detected. The total time spent in the appointment was 25 minutes.     Malachy Mood, MD 09/17/2023

## 2023-09-18 ENCOUNTER — Other Ambulatory Visit: Payer: Self-pay

## 2023-09-23 IMAGING — US US FNA BIOPSY THYROID 1ST LESION
1 series · 13 of 13 positions shown · non-contrast
Comparison: PET-CT 06/03/2021

MEDICATIONS:
Lidocaine 1%

COMPLICATIONS:
And

INDICATION: Indeterminate thyroid nodule

EXAM:
ULTRASOUND GUIDED FINE NEEDLE ASPIRATION OF INDETERMINATE THYROID
NODULE
TECHNIQUE: Informed written consent was obtained from the patient after a
discussion of the risks, benefits and alternatives to treatment.
Questions regarding the procedure were encouraged and answered. A
timeout was performed prior to the initiation of the procedure.

[Series 1: us fna biopsy thyroid 1st lesion · 0.06mm/px · 13 acquisitions, 13 frames shown]
[im 1/13]
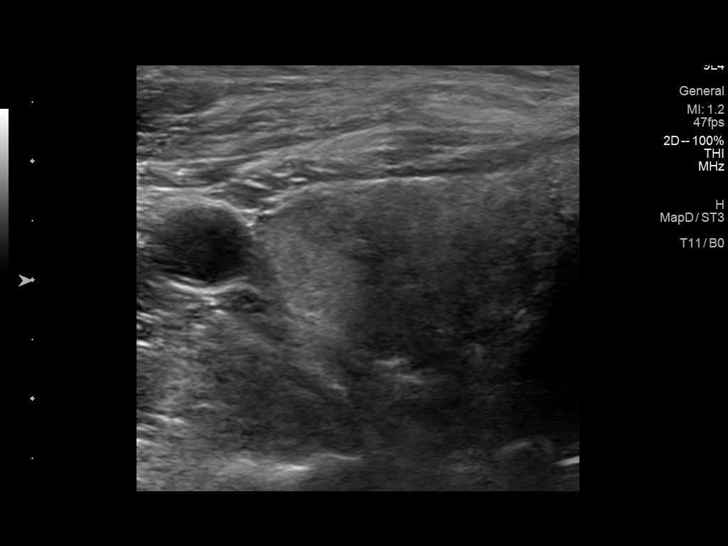
[im 2/13]
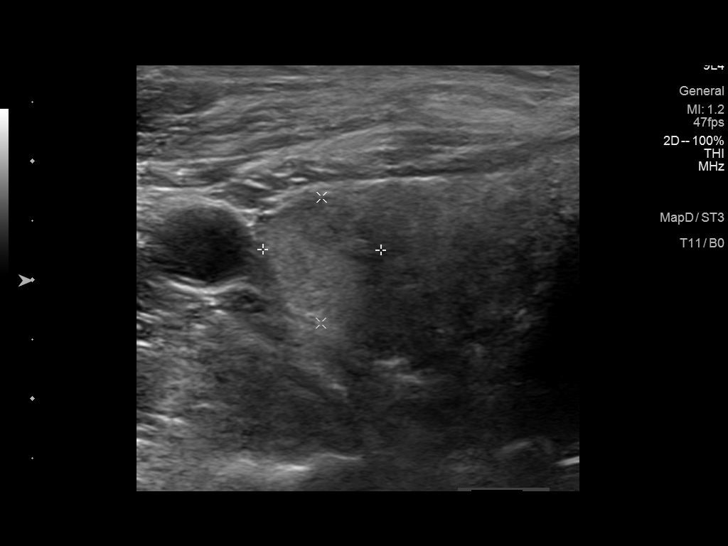
[im 3/13]
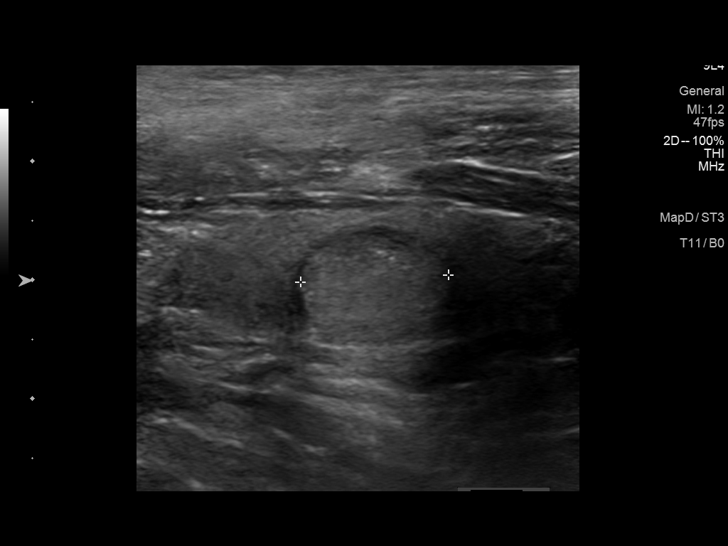
[im 4/13]
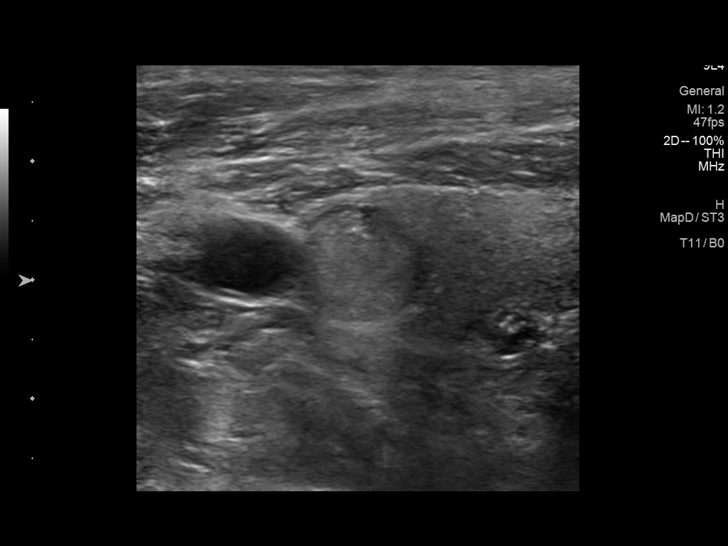
[im 5/13]
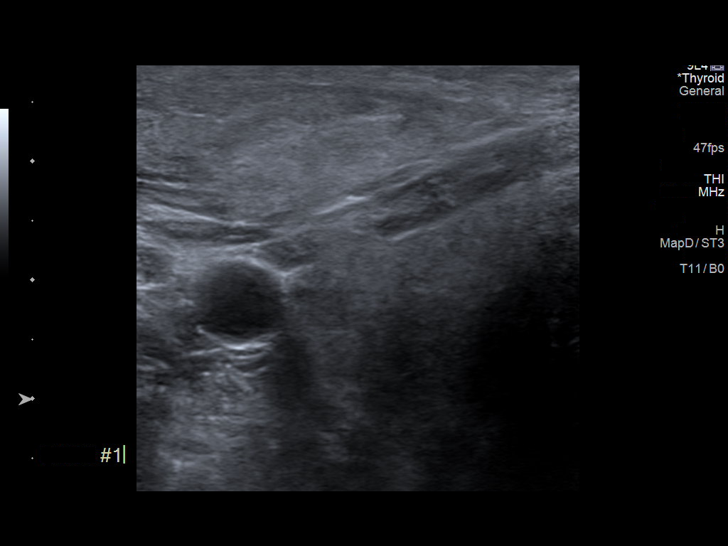
[im 6/13]
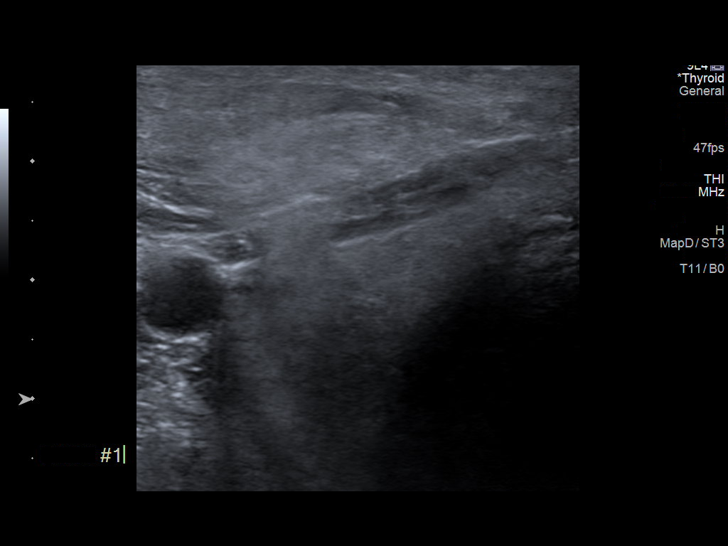
[im 7/13]
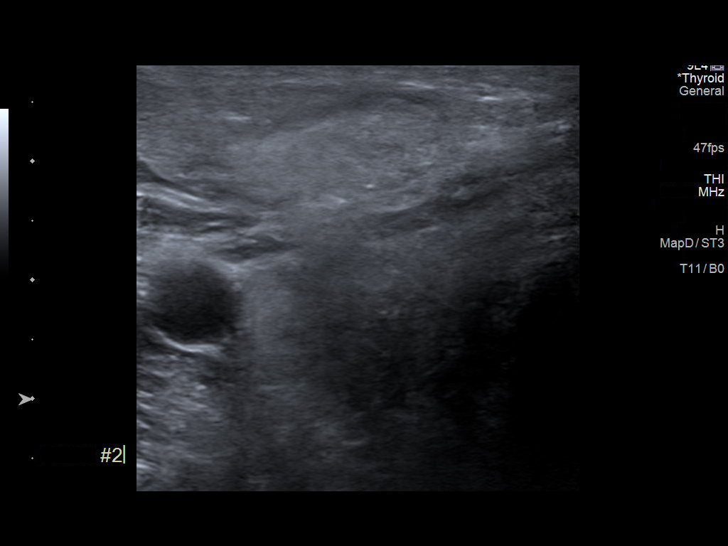
[im 8/13]
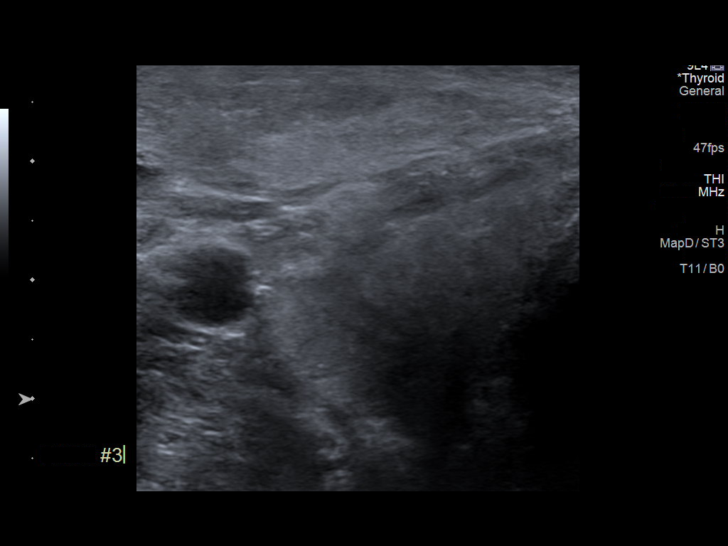
[im 9/13]
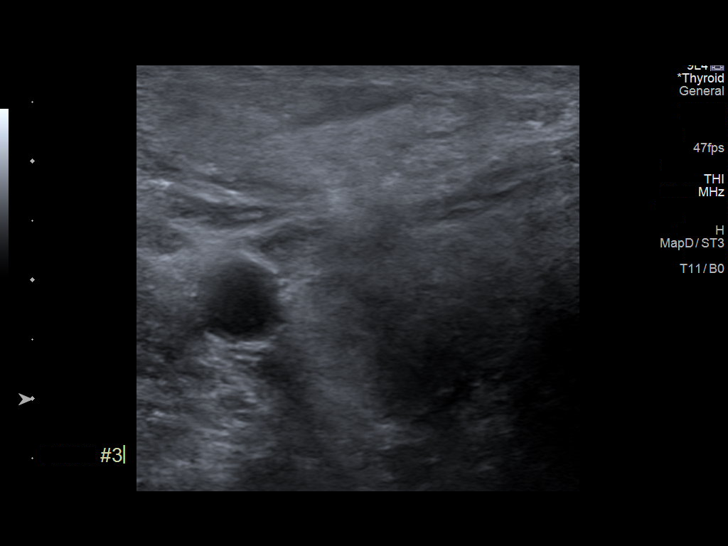
[im 10/13]
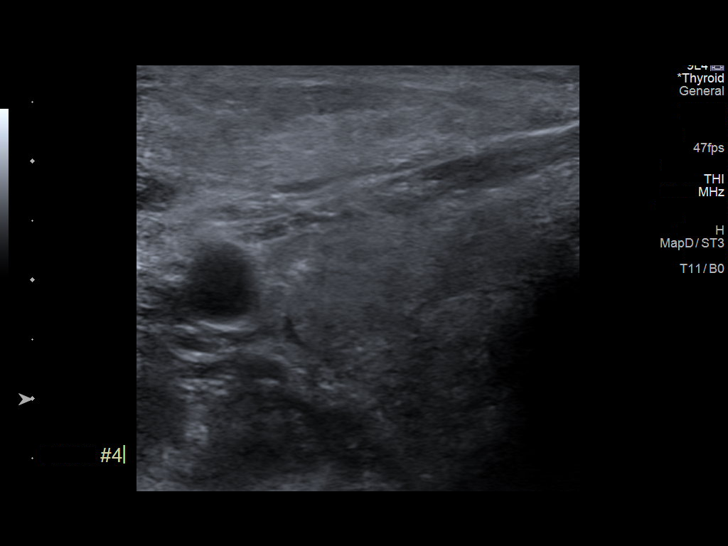
[im 11/13]
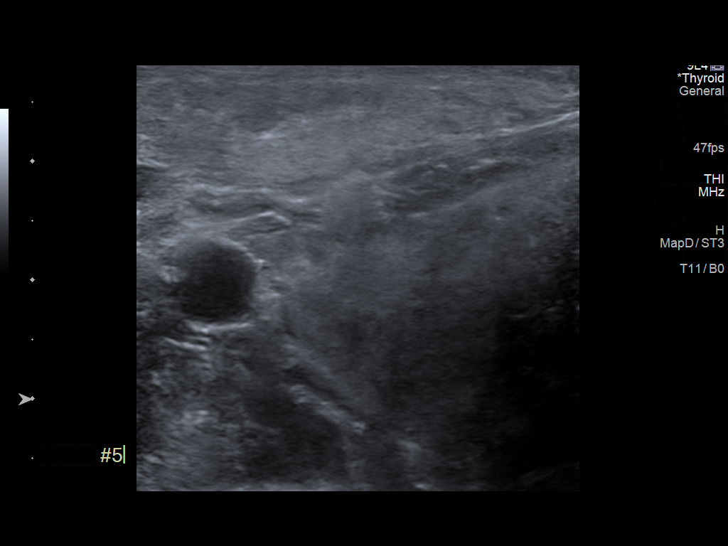
[im 12/13]
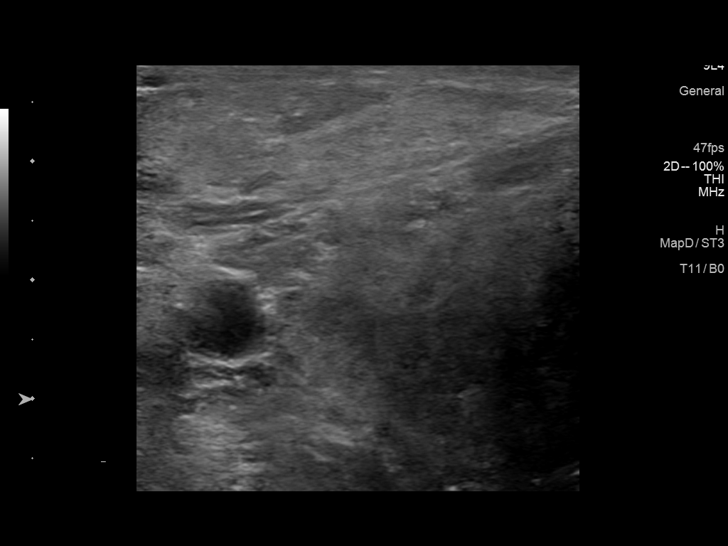
[im 13/13]
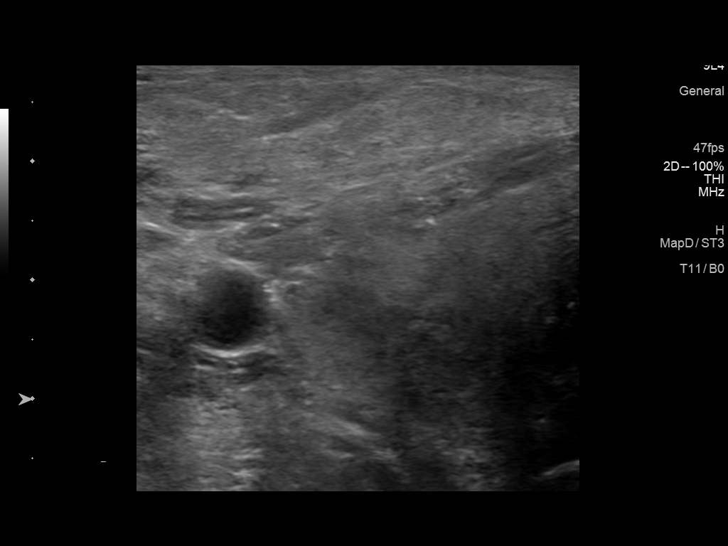

[13 of 13 positions shown; findings below may reference images not displayed]

Pre-procedural ultrasound scanning demonstrated unchanged size and
appearance of the indeterminate nodule within the right
inferolateral

The procedure was planned. The neck was prepped in the usual sterile
fashion, and a sterile drape was applied covering the operative
field. A timeout was performed prior to the initiation of the
procedure. Local anesthesia was provided with 1% lidocaine.

Under direct ultrasound guidance, 5 FNA biopsies were performed of
the right inferolateral nodule with a 25 gauge needle. Multiple
ultrasound images were saved for procedural documentation purposes.
The samples were prepared and submitted to pathology.

Limited post procedural scanning was negative for hematoma or
additional complication. Dressings were placed. The patient
tolerated the above procedures procedure well without immediate
postprocedural complication. Operator: Maylin Fenner PA.
FINDINGS: Nodule reference number based on prior diagnostic ultrasound: 1

Maximum size: 1.1 cm

Location: Right; inferior

ACR TI-RADS risk category: TR3 (3 points)

Reason for biopsy: meets other recommendations

Ultrasound imaging confirms appropriate placement of the needles
within the thyroid nodule.
IMPRESSION: Technically successful ultrasound guided fine needle aspiration of
inferior right nodule.

## 2023-09-24 IMAGING — US US FINE NEEDLE ASP 1ST LESION
1 series · 13 of 21 positions shown · non-contrast
Comparison: US Thyroid 06/03/21

MEDICATIONS:
10 cc 1% lidocaine

COMPLICATIONS:
None immediate.

INDICATION: Indeterminate thyroid nodule

Right inferior thyroid nodule
1.1 cm
EXAM:
ULTRASOUND GUIDED FINE NEEDLE ASPIRATION OF INDETERMINATE THYROID
NODULE
TECHNIQUE: Informed written consent was obtained from the patient after a
discussion of the risks, benefits and alternatives to treatment.
Questions regarding the procedure were encouraged and answered. A
timeout was performed prior to the initiation of the procedure.

[Series 1: us fna biopsy soft tissue 1st lesion · 21 acquisitions, 13 frames shown]
[im 1/21]
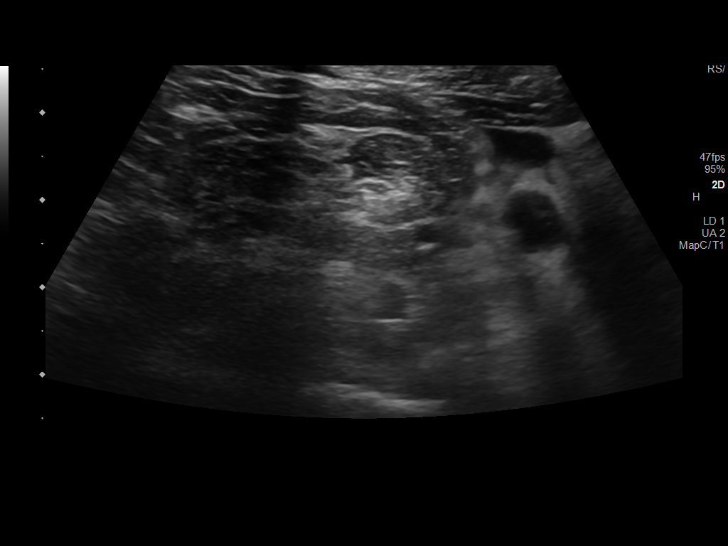
[im 3/21]
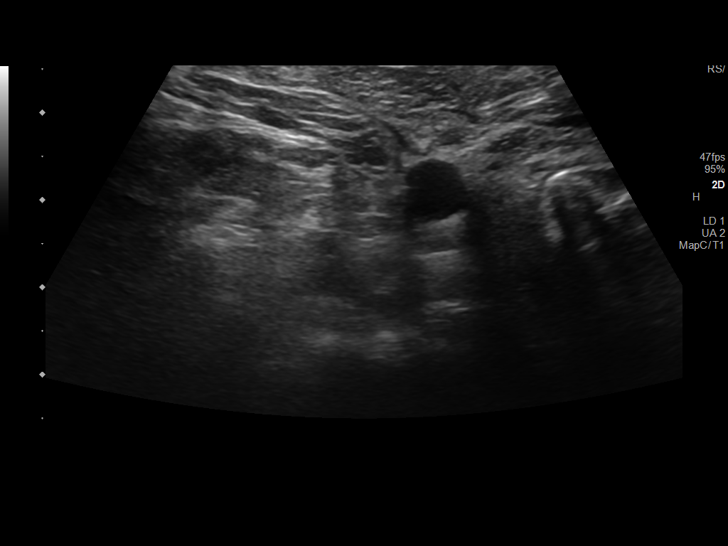
[im 5/21]
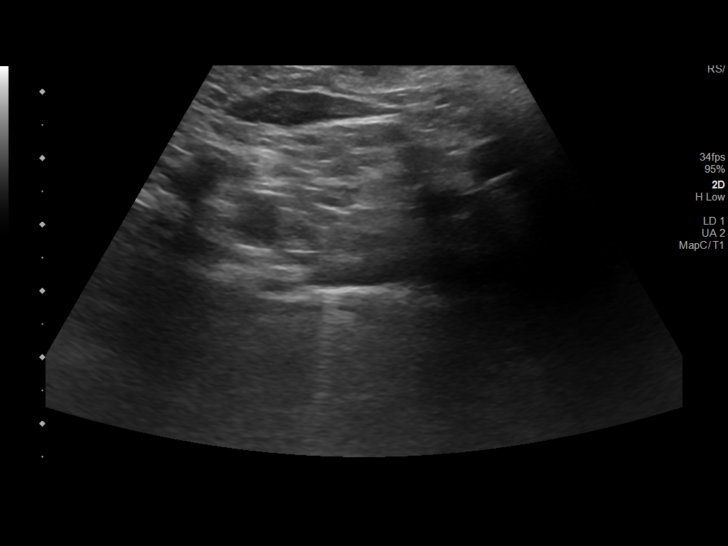
[im 6/21]
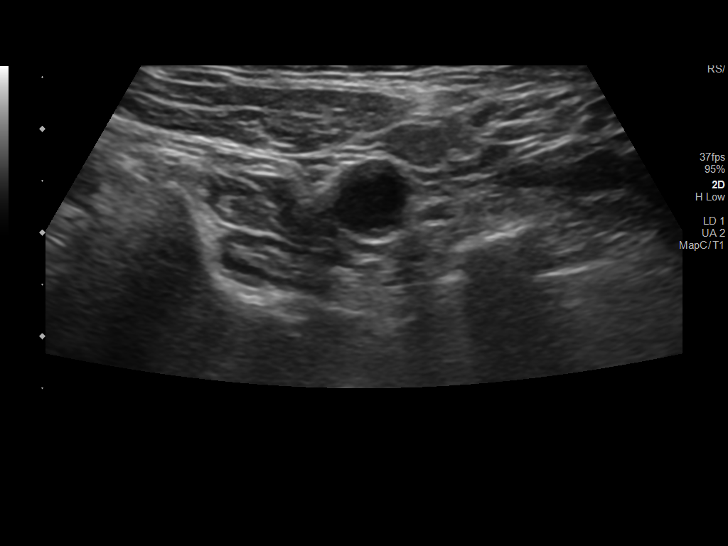
[im 8/21]
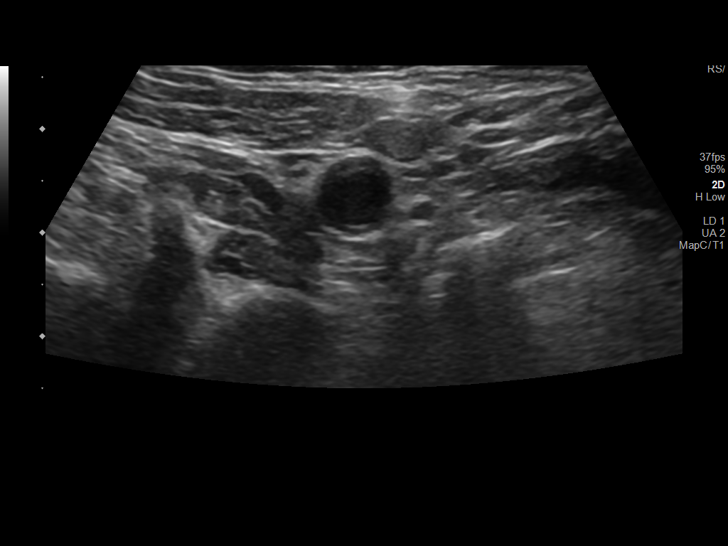
[im 9/21]
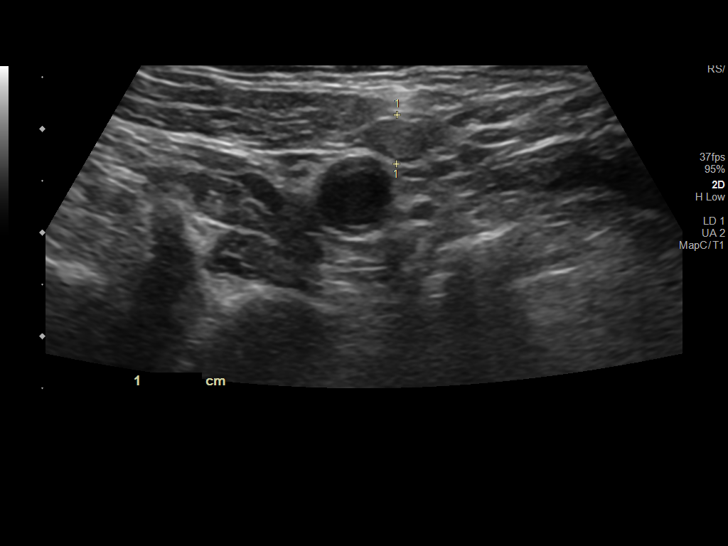
[im 11/21]
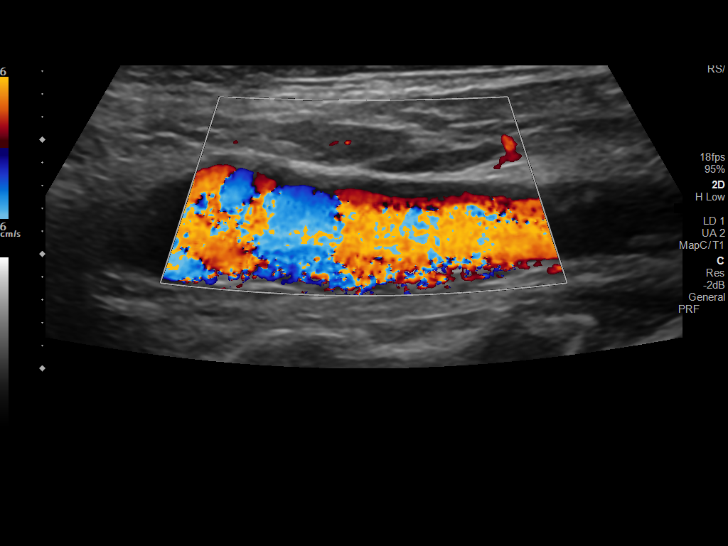
[im 13/21]
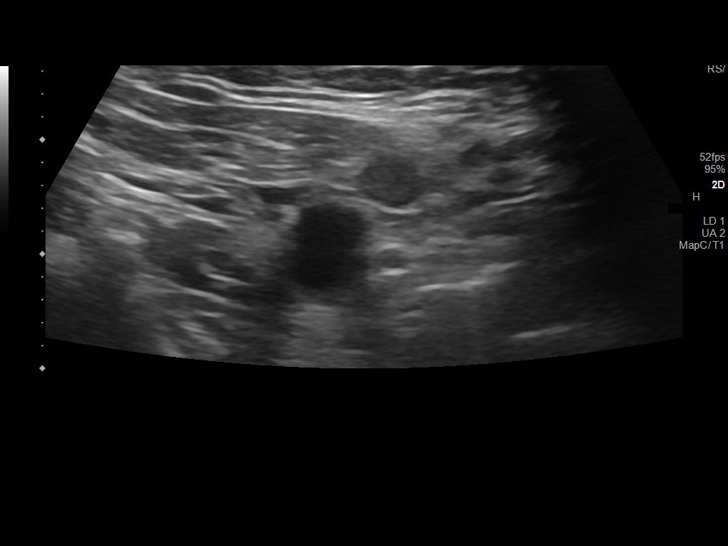
[im 14/21]
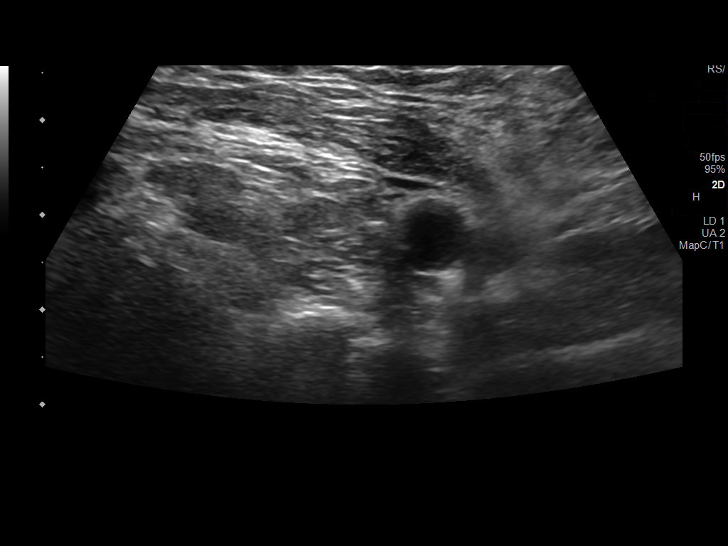
[im 16/21]
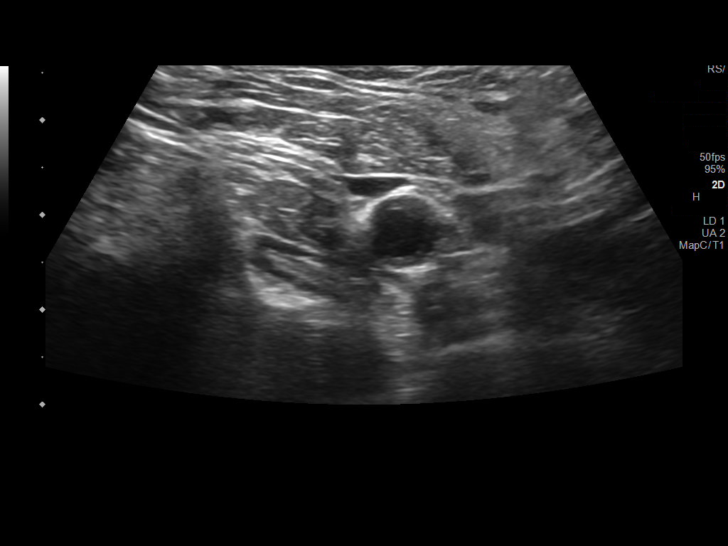
[im 17/21]
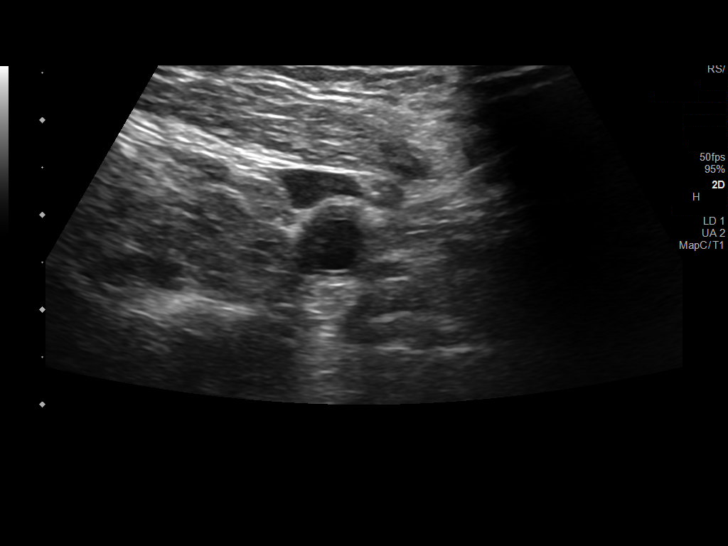
[im 19/21]
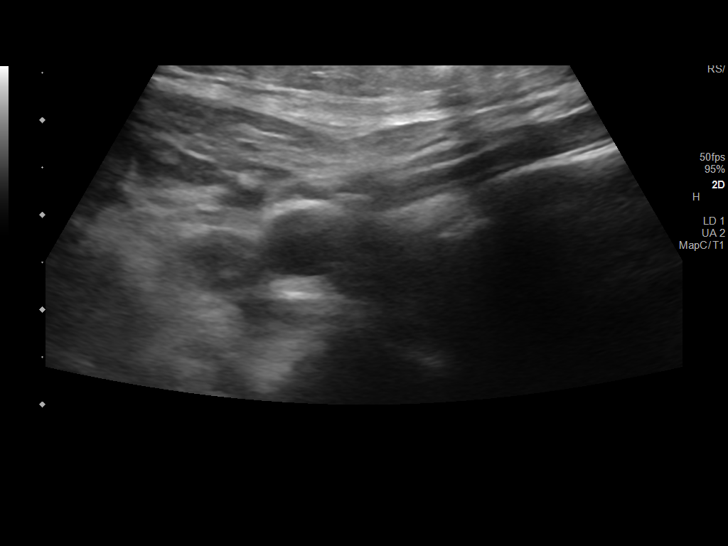
[im 21/21]
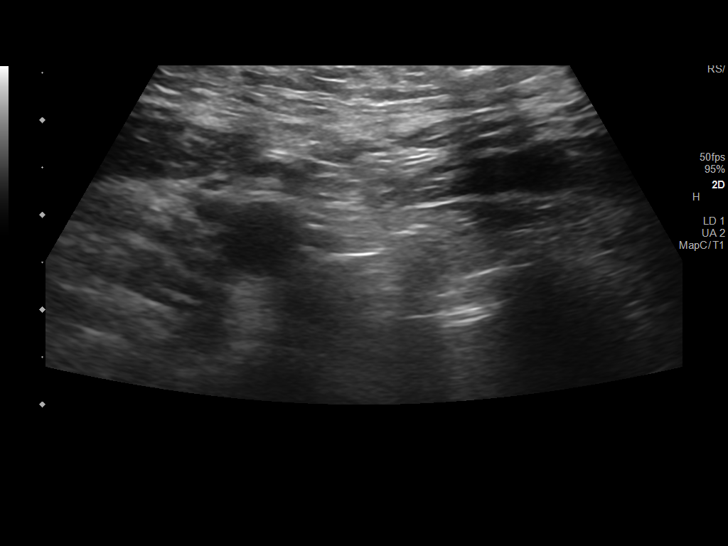

[13 of 21 positions shown; findings below may reference images not displayed]

Pre-procedural ultrasound scanning demonstrated unchanged size and
appearance of the indeterminate nodule within the right thyroid

The procedure was planned. The neck was prepped in the usual sterile
fashion, and a sterile drape was applied covering the operative
field. A timeout was performed prior to the initiation of the
procedure. Local anesthesia was provided with 1% lidocaine.

Under direct ultrasound guidance, 5 FNA biopsies were performed of
the right inferior thyroid nodule with a 27 gauge needle.

2 of these samples were obtained for AFIRMA

Multiple ultrasound images were saved for procedural documentation
purposes. The samples were prepared and submitted to pathology.

Limited post procedural scanning was negative for hematoma or
additional complication. Dressings were placed. The patient
tolerated the above procedures procedure well without immediate
postprocedural complication.
FINDINGS: Nodule reference number based on prior diagnostic ultrasound: 1

Maximum size: 1.1 cm

Location: Right; Inferior

ACR TI-RADS risk category: TR3 (3 points)

Reason for biopsy: meets ACR TI-RADS criteria

Ultrasound imaging confirms appropriate placement of the needles
within the thyroid nodule.
IMPRESSION: Technically successful ultrasound guided fine needle aspiration of
right inferior thyroid nodule

Read by

Mathla Phaki

## 2023-10-01 ENCOUNTER — Ambulatory Visit (HOSPITAL_COMMUNITY)
Admission: RE | Admit: 2023-10-01 | Discharge: 2023-10-01 | Disposition: A | Discharge status: Home / Self Care | Payer: Medicare HMO | Source: Ambulatory Visit | Attending: Hematology | Admitting: Hematology

## 2023-10-01 DIAGNOSIS — C158 Malignant neoplasm of overlapping sites of esophagus: Secondary | ICD-10-CM | POA: Insufficient documentation

## 2023-10-01 DIAGNOSIS — C159 Malignant neoplasm of esophagus, unspecified: Secondary | ICD-10-CM | POA: Diagnosis not present

## 2023-10-01 DIAGNOSIS — Z452 Encounter for adjustment and management of vascular access device: Secondary | ICD-10-CM | POA: Diagnosis not present

## 2023-10-01 HISTORY — PX: IR REMOVAL TUN ACCESS W/ PORT W/O FL MOD SED: IMG2290

## 2023-10-01 MED ORDER — LIDOCAINE-EPINEPHRINE 1 %-1:100000 IJ SOLN
20.0000 mL | Freq: Once | INTRAMUSCULAR | Status: AC
  Administered 2023-10-01: 10 mL via INTRADERMAL

## 2023-10-01 MED ORDER — LIDOCAINE-EPINEPHRINE 1 %-1:100000 IJ SOLN
INTRAMUSCULAR | Status: AC
  Filled 2023-10-01: qty 1

## 2023-10-18 IMAGING — DX DG CHEST 1V PORT
1 series · 1 of 1 positions shown · non-contrast
Comparison: September 09, 2021

CLINICAL DATA: Chest pain.  Evaluate pneumothorax.

EXAM:
PORTABLE CHEST 1 VIEW

[chest ap]
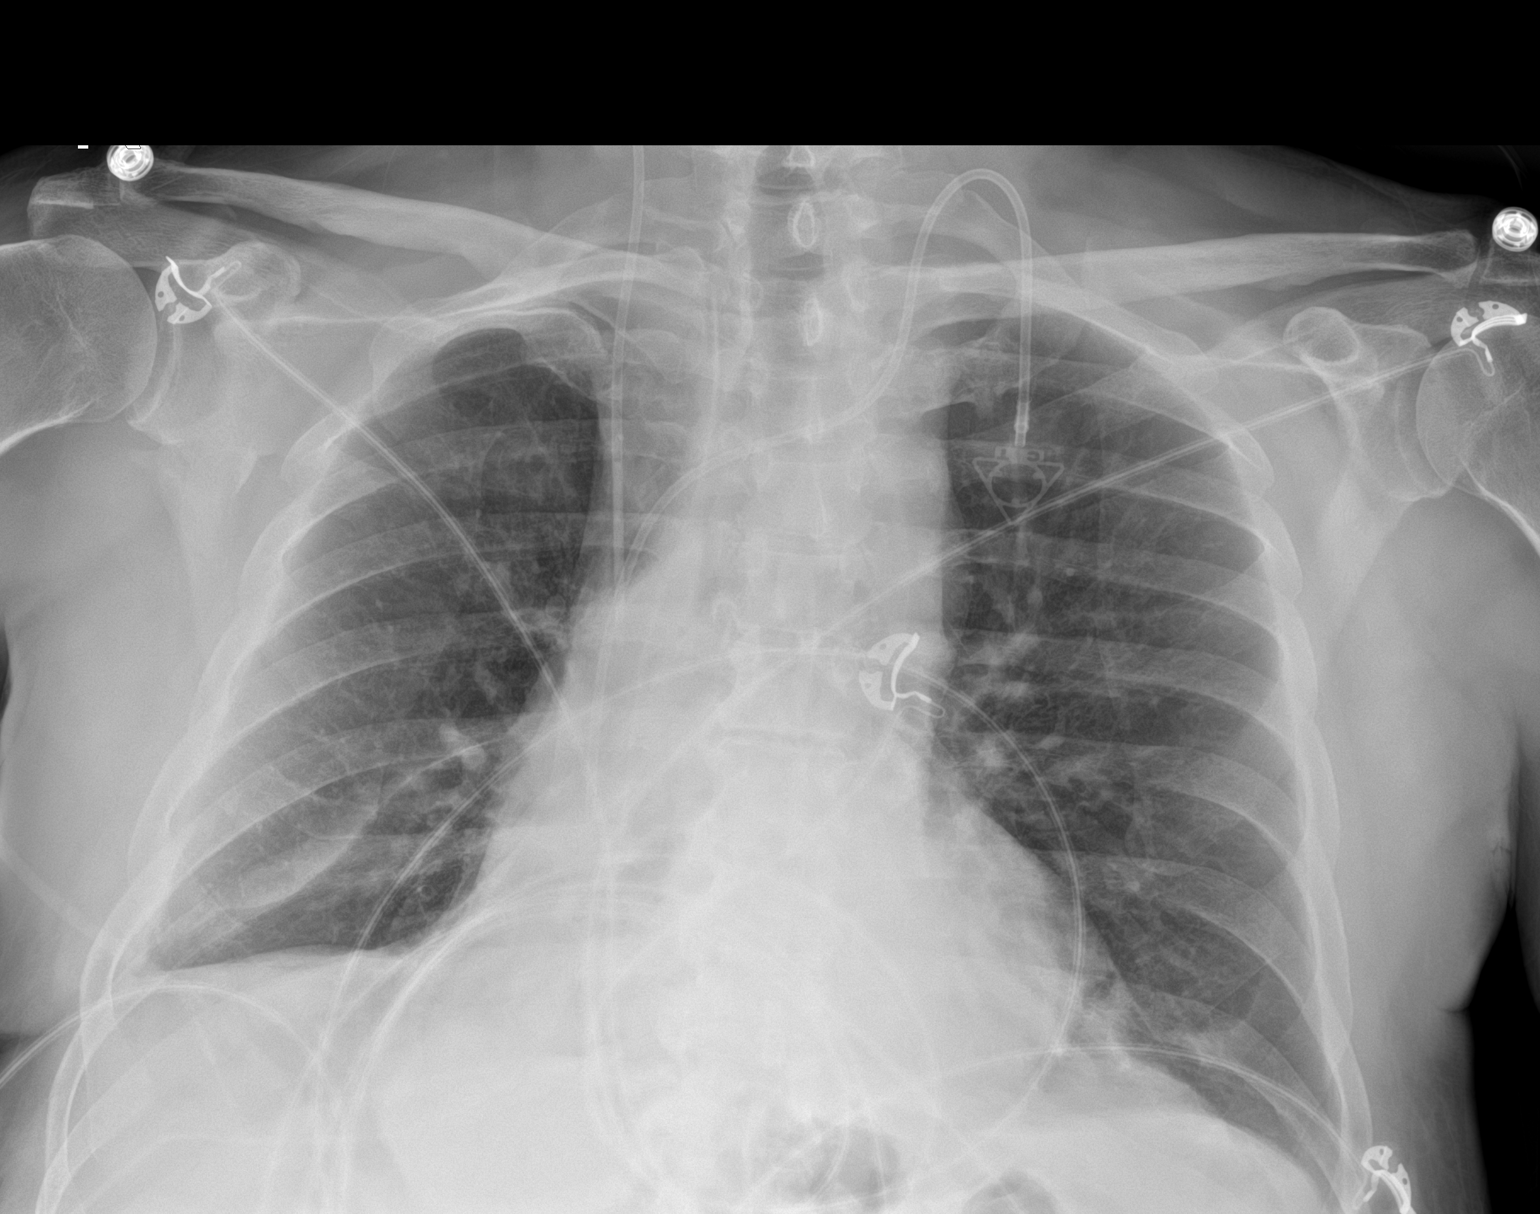

[1 of 1 positions shown; findings below may reference images not displayed]

FINDINGS: Support apparatus are stable. No pneumothorax. Mild increasing
opacity in the right base at the site of a previous chest tube that
has apparently been removed is identified. No other acute
abnormalities.
IMPRESSION: The right chest tube has been removed with no pneumothorax. Other
support apparatus as above.

Mild opacity in the right base not seen previously could represent
atelectasis. Recommend attention on follow-up.

## 2023-11-04 ENCOUNTER — Other Ambulatory Visit: Payer: Self-pay

## 2023-12-18 ENCOUNTER — Encounter: Payer: Self-pay | Admitting: Hematology

## 2023-12-18 ENCOUNTER — Inpatient Hospital Stay: Payer: Medicare HMO | Attending: Hematology | Admitting: Hematology

## 2023-12-18 ENCOUNTER — Inpatient Hospital Stay: Payer: Medicare HMO

## 2023-12-18 VITALS — BP 167/91 | HR 65 | Temp 98.0°F | Resp 19 | Wt 171.0 lb

## 2023-12-18 DIAGNOSIS — C158 Malignant neoplasm of overlapping sites of esophagus: Secondary | ICD-10-CM

## 2023-12-18 DIAGNOSIS — Z923 Personal history of irradiation: Secondary | ICD-10-CM | POA: Insufficient documentation

## 2023-12-18 DIAGNOSIS — Z9221 Personal history of antineoplastic chemotherapy: Secondary | ICD-10-CM | POA: Diagnosis not present

## 2023-12-18 DIAGNOSIS — N400 Enlarged prostate without lower urinary tract symptoms: Secondary | ICD-10-CM | POA: Insufficient documentation

## 2023-12-18 DIAGNOSIS — Z8501 Personal history of malignant neoplasm of esophagus: Secondary | ICD-10-CM | POA: Insufficient documentation

## 2023-12-18 LAB — CMP (CANCER CENTER ONLY)
ALT: 25 U/L (ref 0–44)
AST: 22 U/L (ref 15–41)
Albumin: 4.4 g/dL (ref 3.5–5.0)
Alkaline Phosphatase: 56 U/L (ref 38–126)
Anion gap: 7 (ref 5–15)
BUN: 14 mg/dL (ref 8–23)
CO2: 27 mmol/L (ref 22–32)
Calcium: 9.2 mg/dL (ref 8.9–10.3)
Chloride: 106 mmol/L (ref 98–111)
Creatinine: 1 mg/dL (ref 0.61–1.24)
GFR, Estimated: >60 mL/min (ref >60)
Glucose, Bld: 92 mg/dL (ref 70–99)
Potassium: 3.7 mmol/L (ref 3.5–5.1)
Sodium: 140 mmol/L (ref 135–145)
Total Bilirubin: 0.5 mg/dL (ref 0.0–1.2)
Total Protein: 7.3 g/dL (ref 6.5–8.1)

## 2023-12-18 LAB — CBC WITH DIFFERENTIAL (CANCER CENTER ONLY)
Abs Immature Granulocytes: 0.02 10*3/uL (ref 0.00–0.07)
Basophils Absolute: 0.1 10*3/uL (ref 0.0–0.1)
Basophils Relative: 1 %
Eosinophils Absolute: 0.2 10*3/uL (ref 0.0–0.5)
Eosinophils Relative: 4 %
HCT: 38.5 % — ABNORMAL LOW (ref 39.0–52.0)
Hemoglobin: 13.2 g/dL (ref 13.0–17.0)
Immature Granulocytes: 1 %
Lymphocytes Relative: 22 %
Lymphs Abs: 1 10*3/uL (ref 0.7–4.0)
MCH: 30.9 pg (ref 26.0–34.0)
MCHC: 34.3 g/dL (ref 30.0–36.0)
MCV: 90.2 fL (ref 80.0–100.0)
Monocytes Absolute: 0.4 10*3/uL (ref 0.1–1.0)
Monocytes Relative: 10 %
Neutro Abs: 2.7 10*3/uL (ref 1.7–7.7)
Neutrophils Relative %: 62 %
Platelet Count: 180 10*3/uL (ref 150–400)
RBC: 4.27 MIL/uL (ref 4.22–5.81)
RDW: 13.6 % (ref 11.5–15.5)
WBC Count: 4.4 10*3/uL (ref 4.0–10.5)
nRBC: 0 % (ref 0.0–0.2)

## 2023-12-18 NOTE — Assessment & Plan Note (Signed)
 yp(T3, N2) with upper mediastinal adenopathy  -diagnosed in 03/2021 -s/p concurrent chemoRT 05/24/21 - 07/04/21. He started with chemo TC then switched to FOLFOX on 06/20/21. He completed 4 cycles on 08/08/21. Posttreatment PET scan showed excellent response. -s/p esophagogastrectomy on 09/05/21 under Dr. Cliffton Asters. -He has completed one year adjuvant Nivo in Nov 2023, tolerated very well  -Surveillance CT scan from Mar 20, 2023 showed no evidence of recurrence, the irregular nodular consolidation in the anterior right lower lobe is likely infectious, probable from aspiration pneumonia, he is asymptomatic.  Will continue follow-up --surveillance CT scan on September 10, 2023 showed no evidence of recurrence -Continue cancer surveillance

## 2023-12-18 NOTE — Progress Notes (Signed)
 [facility]   [phone] Fax:([phone]   Clinic Follow up Note   Patient Care Team: Lonie Peak, Cordelia Poche as PCP - General (Physician Assistant) Malachy Mood, MD as Consulting Physician (Oncology)  Date of Service:  12/18/2023  CHIEF COMPLAINT: f/u of esophageal cancer  CURRENT THERAPY:  Cancer surveillance  Oncology History   Malignant neoplasm of overlapping sites of esophagus (HCC) yp(T3, N2) with upper mediastinal adenopathy  -diagnosed in 03/2021 -s/p concurrent chemoRT 05/24/21 - 07/04/21. He started with chemo TC then switched to FOLFOX on 06/20/21. He completed 4 cycles on 08/08/21. Posttreatment PET scan showed excellent response. -s/p esophagogastrectomy on 09/05/21 under Dr. Cliffton Asters. -He has completed one year adjuvant Nivo in Nov 2023, tolerated very well  -Surveillance CT scan from Mar 20, 2023 showed no evidence of recurrence, the irregular nodular consolidation in the anterior right lower lobe is likely infectious, probable from aspiration pneumonia, he is asymptomatic.  Will continue follow-up --surveillance CT scan on September 10, 2023 showed no evidence of recurrence -Continue cancer surveillance    Assessment and Plan    Esophageal Cancer Follow-up for esophageal cancer diagnosed in June 2022. No new symptoms, including dysphagia or gastrointestinal issues. Weight gain and good appetite. Previous mild anemia resolved. Last scan in November 2024, approaching two years post-diagnosis. Plan for future scans discussed: one more scan in three months, then annual scans for two more years. If the next two scans are clear, no further scans needed. Patient understands and agrees. - Order CT scan in three months - Schedule follow-up appointment in three months - Plan for annual scans after the next scan, with two more scans anticipated  Benign Prostatic Hyperplasia (BPH) Under urologist care for BPH. Enlarged prostate, but biopsy six months  ago was normal. - Continue follow-up with urologist  General Health Maintenance Annual dermatology visits for skin checks. - Continue annual dermatology visits  Plan -Patient is clinically doing well, no concern for recurrence - Schedule CT scan and lab work one week before the next appointment in 3 months         SUMMARY OF ONCOLOGIC HISTORY: Oncology History Overview Note   Cancer Staging  Malignant neoplasm of overlapping sites of esophagus [facility]) Staging form: Esophagus - Adenocarcinoma, AJCC 8th Edition - Clinical stage from 05/13/2021: Stage IVA (cT3, cN2, cM0) - Signed by Malachy Mood, MD on 05/22/2021    Malignant neoplasm of overlapping sites of esophagus (HCC)  04/18/2021 Imaging   ULTRASOUND OF HEAD/NECK SOFT TISSUES  IMPRESSION: No suspicious lymphadenopathy   04/19/2021 Procedure   EGD  Impression: - Esophageal polyp(s) were found. Biopsied. - Partially obstructing, likely malignant esophageal tumor was found in the distal esophagus. - Small hiatal hernia. - A single gastric polyp. Biopsied. - Normal duodenal bulb, first portion of the duodenum and second portion of the duodenum.   04/19/2021 Pathology Results   FINAL MICROSCOPIC DIAGNOSIS: Stomach, Biopsy - FUNDIC GLAND POLYP. Negative for dysplasia. NO Helicobacter pylori organisms seen on H and E stain. Esophagus- Distal, Biopsy - INVASIVE MODERATELY DIFFERENTIATED ADENOCARCINOMA OF THE ESOPHAGUS.  - HER2 Study by Immunostain: Negative (score 1+) Esophagus- Mid, Biopsy - INVASIVE MODERATELY DIFFERENTIATED ADENOCARCINOMA OF THE ESOPHAGUS. - HER2 Study by Immunostain: Negative (score 1+)   05/05/2021 Imaging   CT CAP  IMPRESSION: 1. The patient's known esophageal mass is not well visualized. There is mild irregular wall thickening of the distal esophagus. Correlate with previous clinical evaluation. 2. Indeterminate mildly enlarged high right paratracheal node near the  thoracic inlet. No other mediastinal  adenopathy. 3. No evidence of metastatic disease in the abdomen or pelvis. 4. Stable small pulmonary nodules bilaterally consistent with benign findings. 5. Cholelithiasis, small renal cysts and Aortic Atherosclerosis (ICD10-I70.0).   05/09/2021 Initial Diagnosis   Malignant neoplasm of overlapping sites of esophagus (HCC)   05/11/2021 Procedure   Upper EUS  Impression: - Mucosal nodule found in the esophagus. - Partially obstructing, malignant esophageal tumor was found at the gastroesophageal junction. - At least 3 discrete peritumoral lymph nodes - A mass was found in the gastroesophageal junction. A tissue diagnosis was obtained prior to this exam. This is of adenocarcinoma. This was staged T3 N2 Mx by endosonographic criteria. - Unable to traverse GE junction with either EGD scope or radial EUS scope.   05/13/2021 Cancer Staging   Staging form: Esophagus - Adenocarcinoma, AJCC 8th Edition - Clinical stage from 05/13/2021: Stage IVA (cT3, cN2, cM0) - Signed by Malachy Mood, MD on 05/22/2021   05/16/2021 PET scan   IMPRESSION: 1. Distal esophageal mass measuring about 6.2 cm in length, maximum SUV 13.1. 2. Mildly hypermetabolic and mildly enlarged upper right paratracheal lymph node, 1.3 cm in short axis with maximum SUV 3.6, concerning for malignant involvement. 3. Hypodense 1.1 cm right thyroid nodule has a maximum SUV substantially above that of the background thyroid. A significant minority of such nodules can harbor thyroid cancer. Recommend thyroid US and biopsy (ref: J Am Coll Radiol. 2015 Feb;12(2): 143-50). 4. 3 by 4 mm right lower lobe pulmonary nodule is similar to 05/05/2021, not hypermetabolic but below sensitive PET-CT size thresholds. Surveillance recommended. 5. Other imaging findings of potential clinical significance: Chronic left maxillary sinusitis. Aortic Atherosclerosis (ICD10-I70.0). Cholelithiasis. Prostatomegaly.   05/24/2021 - 06/13/2021 Chemotherapy          05/26/2021 Pathology Results   FINAL MICROSCOPIC DIAGNOSIS:   A. LYMPH NODE, 2R, FINE NEEDLE ASPIRATION:  - Malignant cells consistent with non-small cell carcinoma   COMMENT:  The cells are positive for cytokeratin 20 and CDX-2 (patchy). TTF-1, NapsinA, cytokeratin 5/6, p40, S100, MelanA, and cytokeratin 7 are negative. The patient's history of esophageal adenocarcinoma is noted and CDX-2 positivity is consistent with a gastrointestinal primary. This cytokeratin7/20 profile is usually seen in lower tract tumors, but this pattern can be seen in gastric/esophageal cases.   05/27/2021 - 07/04/2021 Radiation Therapy   Per Dr. Basilio Cairo   06/20/2021 - 08/08/2021 Chemotherapy   Patient is on Treatment Plan : GASTROESOPHAGEAL FOLFOX q14d x 6 cycles     08/04/2021 PET scan   IMPRESSION: 1. Interval development of a new 6 mm short axis right cervical node with hypermetabolism. Finding is somewhat indeterminate given the apparent response of the hypermetabolic right paratracheal lymphadenopathy seen on the previous study. Close follow-up recommended as metastatic disease not excluded. 2. Diffuse hypermetabolic FDG accumulation noted in the esophagus today with more diffuse circumferential esophageal wall thickening on today's study than on the previous exam. Hypermetabolism in the distal esophagus at the level of the hypermetabolic neoplasm previously has decreased from SUV max = 13 to SUV max = 7 today. 3. Stable tiny 3-4 mm right lower lobe and left upper lobe pulmonary nodules. Continued attention on follow-up recommended. 4. Cholelithiasis. 5. Emphysema.  (ICD10-J43.9) 6.  Aortic Atherosclerois (ICD10-170.0)   08/17/2021 Pathology Results   FINAL MICROSCOPIC DIAGNOSIS:  A. LYMPH NODE, RIGHT CERVICAL, FINE NEEDLE ASPIRATION:  - No malignant cells identified  - See comment   COMMENT:  There is scant cellularity and  the specimen may not be representative.    09/05/2021 Definitive Surgery   FINAL  MICROSCOPIC DIAGNOSIS:   A. ESOPHAGOGASTRECTOMY:  - Invasive poorly differentiated adenocarcinoma.  - Metastatic carcinoma involving 5 of 13 lymph nodes (5/13).  - See oncology table below.   B. LYMPH NODE, LEVEL 8, EXCISION:  - One lymph node, negative for malignancy (0/1).   09/05/2021 Cancer Staging   Staging form: Esophagus - Adenocarcinoma, AJCC 8th Edition - Pathologic stage from 09/05/2021: Stage IIIB (ypT3, pN2, cM0, G3) - Signed by Malachy Mood, MD on 12/03/2021 Stage prefix: Post-therapy Response to neoadjuvant therapy: Partial response Histologic grading system: 3 grade system Residual tumor (R): R0 - None   10/10/2021 - 06/19/2022 Chemotherapy   Patient is on Treatment Plan : GASTROESOPHAGEAL Nivolumab q14d x 8 cycles / Nivolumab q28d     10/10/2021 -  Chemotherapy   Patient is on Treatment Plan : GASTROESOPHAGEAL Nivolumab (240) q14d x 8 cycles / Nivolumab (480) q28d     12/30/2021 Imaging   EXAM: CT CHEST, ABDOMEN, AND PELVIS WITH CONTRAST  IMPRESSION: Status post esophagectomy with gastric pull-through procedure. No evidence of recurrent or metastatic carcinoma within the chest, abdomen, or pelvis.   Cholelithiasis. No radiographic evidence of cholecystitis.   Stable enlarged prostate.   Aortic Atherosclerosis (ICD10-I70.0).      Discussed the use of AI scribe software for clinical note transcription with the patient, who gave verbal consent to proceed.  History of Present Illness   The patient, a 68 year old with a history of esophageal cancer, presents for a routine follow-up. He reports no new symptoms and has been eating well, with no trouble swallowing food or pills. The patient has gained a few pounds since the last visit three months ago. He has been spending his free time working around the house and on hot rods since retirement. The patient denies any stomach issues, bloating, pain, or issues with bowel movements. He also has a history of prostate issues  and sees a urologist regularly.         All other systems were reviewed with the patient and are negative.  MEDICAL HISTORY:  Past Medical History:  Diagnosis Date   Arthritis    BPH (benign prostatic hyperplasia)    Dyslipidemia    Elevated PSA    Esophageal cancer (HCC)    Full dentures    GERD (gastroesophageal reflux disease)    History of melanoma excision    07/ 2011  s/p  wide excision left back--- per pathology-- negative maligant --  mild melanocytic atypia   History of palpitations    cardiologist --  dr Nicki Guadalajara (last note in epic 2014   Hypertension    Sleep apnea     SURGICAL HISTORY: Past Surgical History:  Procedure Laterality Date   COLONOSCOPY  10/24/2007   ESOPHAGOGASTRODUODENOSCOPY N/A 09/05/2021   Procedure: ESOPHAGOGASTRODUODENOSCOPY (EGD);  Surgeon: Corliss Skains, MD;  Location: [facility] OR;  Service: Thoracic;  Laterality: N/A;   ESOPHAGOGASTRODUODENOSCOPY (EGD) WITH PROPOFOL N/A 05/11/2021   Procedure: ESOPHAGOGASTRODUODENOSCOPY (EGD) WITH PROPOFOL;  Surgeon: Willis Modena, MD;  Location: [facility] ENDOSCOPY;  Service: Endoscopy;  Laterality: N/A;   EUS N/A 05/11/2021   Procedure: ESOPHAGEAL ENDOSCOPIC ULTRASOUND (EUS) RADIAL;  Surgeon: Willis Modena, MD;  Location: [facility] ENDOSCOPY;  Service: Endoscopy;  Laterality: N/A;   EYE SURGERY     Lasik surgery on both eyes, but still needs glasses   FINE NEEDLE ASPIRATION  05/26/2021   Procedure: FINE NEEDLE ASPIRATION (FNA) LINEAR;  Surgeon: Josephine Igo, DO;  Location: [facility] ENDOSCOPY;  Service: Pulmonary;;   INTERCOSTAL NERVE BLOCK  09/05/2021   Procedure: INTERCOSTAL NERVE BLOCK;  Surgeon: Corliss Skains, MD;  Location: [facility] OR;  Service: Thoracic;;   IR IMAGING GUIDED PORT INSERTION  06/30/2021   IR REMOVAL TUN ACCESS W/ PORT W/O FL MOD SED  10/01/2023   JEJUNOSTOMY  09/05/2021   Procedure: JEJUNOSTOMY;  Surgeon: Corliss Skains, MD;  Location: [facility] OR;  Service: Thoracic;;   PROSTATE BIOPSY N/A  07/02/2015   Procedure: BIOPSY TRANSRECTAL ULTRASONIC PROSTATE (TUBP);  Surgeon: Su Grand, MD;  Location[facility];  Service: Urology;  Laterality: N/A;   PROSTATE BIOPSY N/A 11/22/2015   Procedure: BIOPSY TRANSRECTAL ULTRASONIC PROSTATE (TUBP);  Surgeon: Malen Gauze, MD;  Location[facility];  Service: Urology;  Laterality: N/A;   TRANSTHORACIC ECHOCARDIOGRAM  11/19/2012   normal echo/  trivial TR   VIDEO BRONCHOSCOPY WITH ENDOBRONCHIAL ULTRASOUND N/A 05/26/2021   Procedure: VIDEO BRONCHOSCOPY WITH ENDOBRONCHIAL ULTRASOUND;  Surgeon: Josephine Igo, DO;  Location: [facility];  Service: Pulmonary;  Laterality: N/A;   WIDE EXCISION MELANOMA LEFT BACK  05/02/2010    I have reviewed the social history and family history with the patient and they are unchanged from previous note.  ALLERGIES:  is allergic to bee venom.  MEDICATIONS:  Current Outpatient Medications  Medication Sig Dispense Refill   aspirin EC 81 MG tablet Take 81 mg by mouth in the morning.     ketoconazole (NIZORAL) 2 % cream APPLY 1 APPLICATION TOPICALLY DAILY 15 g 0   metoprolol succinate (TOPROL-XL) 50 MG 24 hr tablet Take 50 mg by mouth daily. Take with or immediately following a meal.     Multiple Vitamin (MULTIVITAMIN WITH MINERALS) TABS tablet Take 1 tablet by mouth daily.     omeprazole (PRILOSEC) 40 MG capsule Take 40 mg by mouth 2 (two) times daily.     Skin Protectants, Misc. (EUCERIN) cream Apply 1 application. topically daily as needed for dry skin.     No current facility-administered medications for this visit.    PHYSICAL EXAMINATION: ECOG PERFORMANCE STATUS: 0 - Asymptomatic  Vitals:   12/18/23 1328  BP: (!) 167/91  Pulse: 65  Resp: 19  Temp: 98 F (36.7 C)  SpO2: 100%   Wt Readings from Last 3 Encounters:  12/18/23 171 lb (77.6 kg)  09/17/23 169 lb 2 oz (76.7 kg)  06/14/23 165 lb 6.4 oz (75 kg)     GENERAL:alert, no distress and comfortable SKIN:  skin color, texture, turgor are normal, no rashes or significant lesions EYES: normal, Conjunctiva are pink and non-injected, sclera clear NECK: supple, thyroid normal size, non-tender, without nodularity LYMPH:  no palpable lymphadenopathy in the cervical, axillary  LUNGS: clear to auscultation and percussion with normal breathing effort HEART: regular rate & rhythm and no murmurs and no lower extremity edema ABDOMEN:abdomen soft, non-tender and normal bowel sounds Musculoskeletal:no cyanosis of digits and no clubbing  NEURO: alert & oriented x 3 with fluent speech, no focal motor/sensory deficits   LABORATORY DATA:  I have reviewed the data as listed    Latest Ref Rng & Units 12/18/2023    1:04 PM 09/10/2023   12:28 PM 06/14/2023    1:10 PM  CBC  WBC 4.0 - 10.5 K/uL 4.4  4.6  4.9   Hemoglobin 13.0 - 17.0 g/dL 16.1  09.6  04.5   Hematocrit 39.0 - 52.0 % 38.5  37.6  36.4   Platelets 150 - 400 K/uL 180  187  195         Latest Ref Rng & Units 12/18/2023    1:04 PM 09/10/2023   12:28 PM 06/14/2023    1:10 PM  CMP  Glucose 70 - 99 mg/dL 92  161  75   BUN 8 - 23 mg/dL 14  15  14    Creatinine 0.61 - 1.24 mg/dL 0.96  0.45  4.09   Sodium 135 - 145 mmol/L 140  138  141   Potassium 3.5 - 5.1 mmol/L 3.7  4.1  3.9   Chloride 98 - 111 mmol/L 106  104  107   CO2 22 - 32 mmol/L 27  28  28    Calcium 8.9 - 10.3 mg/dL 9.2  81.1  9.0   Total Protein 6.5 - 8.1 g/dL 7.3  6.9  7.0   Total Bilirubin 0.0 - 1.2 mg/dL 0.5  0.5  0.4   Alkaline Phos 38 - 126 U/L 56  54  62   AST 15 - 41 U/L 22  18  17    ALT 0 - 44 U/L 25  20  15        RADIOGRAPHIC STUDIES: I have personally reviewed the radiological images as listed and agreed with the findings in the report. No results found.    Orders Placed This Encounter  Procedures   CT CHEST ABDOMEN PELVIS W CONTRAST    Standing Status:   Future    Expected Date:   03/11/2024    Expiration Date:   12/17/2024    If indicated for the ordered procedure,  I authorize the administration of contrast media per Radiology protocol:   Yes    Does the patient have a contrast media/X-ray dye allergy?:   No    Preferred imaging location?:   [facility]    If indicated for the ordered procedure, I authorize the administration of oral contrast media per Radiology protocol:   Yes   All questions were answered. The patient knows to call the clinic with any problems, questions or concerns. No barriers to learning was detected. The total time spent in the appointment was 20 minutes.     Malachy Mood, MD 12/18/2023

## 2023-12-19 ENCOUNTER — Other Ambulatory Visit: Payer: Self-pay

## 2023-12-21 DIAGNOSIS — Z8501 Personal history of malignant neoplasm of esophagus: Secondary | ICD-10-CM | POA: Diagnosis not present

## 2023-12-21 DIAGNOSIS — K219 Gastro-esophageal reflux disease without esophagitis: Secondary | ICD-10-CM | POA: Diagnosis not present

## 2023-12-21 DIAGNOSIS — G4733 Obstructive sleep apnea (adult) (pediatric): Secondary | ICD-10-CM | POA: Diagnosis not present

## 2023-12-21 DIAGNOSIS — I10 Essential (primary) hypertension: Secondary | ICD-10-CM | POA: Diagnosis not present

## 2023-12-21 DIAGNOSIS — R972 Elevated prostate specific antigen [PSA]: Secondary | ICD-10-CM | POA: Diagnosis not present

## 2023-12-21 DIAGNOSIS — Z1331 Encounter for screening for depression: Secondary | ICD-10-CM | POA: Diagnosis not present

## 2023-12-21 DIAGNOSIS — Z9181 History of falling: Secondary | ICD-10-CM | POA: Diagnosis not present

## 2023-12-21 DIAGNOSIS — E782 Mixed hyperlipidemia: Secondary | ICD-10-CM | POA: Diagnosis not present

## 2023-12-21 DIAGNOSIS — Z0000 Encounter for general adult medical examination without abnormal findings: Secondary | ICD-10-CM | POA: Diagnosis not present

## 2023-12-21 DIAGNOSIS — Z1159 Encounter for screening for other viral diseases: Secondary | ICD-10-CM | POA: Diagnosis not present

## 2024-02-22 DIAGNOSIS — R972 Elevated prostate specific antigen [PSA]: Secondary | ICD-10-CM | POA: Diagnosis not present

## 2024-03-03 DIAGNOSIS — N401 Enlarged prostate with lower urinary tract symptoms: Secondary | ICD-10-CM | POA: Diagnosis not present

## 2024-03-03 DIAGNOSIS — R351 Nocturia: Secondary | ICD-10-CM | POA: Diagnosis not present

## 2024-03-03 DIAGNOSIS — R350 Frequency of micturition: Secondary | ICD-10-CM | POA: Diagnosis not present

## 2024-03-03 DIAGNOSIS — R972 Elevated prostate specific antigen [PSA]: Secondary | ICD-10-CM | POA: Diagnosis not present

## 2024-03-11 ENCOUNTER — Inpatient Hospital Stay: Payer: Medicare HMO | Attending: Hematology

## 2024-03-11 ENCOUNTER — Ambulatory Visit (HOSPITAL_COMMUNITY)
Admission: RE | Admit: 2024-03-11 | Discharge: 2024-03-11 | Disposition: A | Discharge status: Home / Self Care | Source: Ambulatory Visit | Attending: Hematology | Admitting: Hematology

## 2024-03-11 DIAGNOSIS — R918 Other nonspecific abnormal finding of lung field: Secondary | ICD-10-CM | POA: Insufficient documentation

## 2024-03-11 DIAGNOSIS — I10 Essential (primary) hypertension: Secondary | ICD-10-CM | POA: Diagnosis not present

## 2024-03-11 DIAGNOSIS — K219 Gastro-esophageal reflux disease without esophagitis: Secondary | ICD-10-CM | POA: Diagnosis not present

## 2024-03-11 DIAGNOSIS — N400 Enlarged prostate without lower urinary tract symptoms: Secondary | ICD-10-CM | POA: Insufficient documentation

## 2024-03-11 DIAGNOSIS — I700 Atherosclerosis of aorta: Secondary | ICD-10-CM | POA: Insufficient documentation

## 2024-03-11 DIAGNOSIS — K760 Fatty (change of) liver, not elsewhere classified: Secondary | ICD-10-CM | POA: Diagnosis not present

## 2024-03-11 DIAGNOSIS — Z923 Personal history of irradiation: Secondary | ICD-10-CM | POA: Diagnosis not present

## 2024-03-11 DIAGNOSIS — C158 Malignant neoplasm of overlapping sites of esophagus: Secondary | ICD-10-CM

## 2024-03-11 DIAGNOSIS — Z8501 Personal history of malignant neoplasm of esophagus: Secondary | ICD-10-CM | POA: Insufficient documentation

## 2024-03-11 DIAGNOSIS — Z9221 Personal history of antineoplastic chemotherapy: Secondary | ICD-10-CM | POA: Insufficient documentation

## 2024-03-11 DIAGNOSIS — C159 Malignant neoplasm of esophagus, unspecified: Secondary | ICD-10-CM | POA: Diagnosis not present

## 2024-03-11 LAB — CBC WITH DIFFERENTIAL (CANCER CENTER ONLY)
Abs Immature Granulocytes: 0.04 10*3/uL (ref 0.00–0.07)
Basophils Absolute: 0.1 10*3/uL (ref 0.0–0.1)
Basophils Relative: 2 %
Eosinophils Absolute: 0.2 10*3/uL (ref 0.0–0.5)
Eosinophils Relative: 4 %
HCT: 39.8 % (ref 39.0–52.0)
Hemoglobin: 13.8 g/dL (ref 13.0–17.0)
Immature Granulocytes: 1 %
Lymphocytes Relative: 23 %
Lymphs Abs: 1.1 10*3/uL (ref 0.7–4.0)
MCH: 30.7 pg (ref 26.0–34.0)
MCHC: 34.7 g/dL (ref 30.0–36.0)
MCV: 88.6 fL (ref 80.0–100.0)
Monocytes Absolute: 0.6 10*3/uL (ref 0.1–1.0)
Monocytes Relative: 12 %
Neutro Abs: 2.7 10*3/uL (ref 1.7–7.7)
Neutrophils Relative %: 58 %
Platelet Count: 187 10*3/uL (ref 150–400)
RBC: 4.49 MIL/uL (ref 4.22–5.81)
RDW: 14.2 % (ref 11.5–15.5)
WBC Count: 4.7 10*3/uL (ref 4.0–10.5)
nRBC: 0 % (ref 0.0–0.2)

## 2024-03-11 LAB — CMP (CANCER CENTER ONLY)
ALT: 25 U/L (ref 0–44)
AST: 21 U/L (ref 15–41)
Albumin: 4.4 g/dL (ref 3.5–5.0)
Alkaline Phosphatase: 62 U/L (ref 38–126)
Anion gap: 6 (ref 5–15)
BUN: 16 mg/dL (ref 8–23)
CO2: 28 mmol/L (ref 22–32)
Calcium: 9.3 mg/dL (ref 8.9–10.3)
Chloride: 104 mmol/L (ref 98–111)
Creatinine: 1.09 mg/dL (ref 0.61–1.24)
GFR, Estimated: >60 mL/min (ref >60)
Glucose, Bld: 118 mg/dL — ABNORMAL HIGH (ref 70–99)
Potassium: 4 mmol/L (ref 3.5–5.1)
Sodium: 138 mmol/L (ref 135–145)
Total Bilirubin: 0.5 mg/dL (ref 0.0–1.2)
Total Protein: 7.4 g/dL (ref 6.5–8.1)

## 2024-03-11 MED ORDER — SODIUM CHLORIDE (PF) 0.9 % IJ SOLN
INTRAMUSCULAR | Status: AC
  Filled 2024-03-11: qty 50

## 2024-03-11 MED ORDER — IOHEXOL 300 MG/ML  SOLN
100.0000 mL | Freq: Once | INTRAMUSCULAR | Status: AC | PRN
  Administered 2024-03-11: 100 mL via INTRAVENOUS

## 2024-03-20 ENCOUNTER — Inpatient Hospital Stay (HOSPITAL_BASED_OUTPATIENT_CLINIC_OR_DEPARTMENT_OTHER): Payer: Medicare HMO | Admitting: Hematology

## 2024-03-20 VITALS — BP 142/80 | HR 66 | Temp 97.8°F | Resp 16 | Wt 169.7 lb

## 2024-03-20 DIAGNOSIS — Z923 Personal history of irradiation: Secondary | ICD-10-CM | POA: Diagnosis not present

## 2024-03-20 DIAGNOSIS — K760 Fatty (change of) liver, not elsewhere classified: Secondary | ICD-10-CM | POA: Diagnosis not present

## 2024-03-20 DIAGNOSIS — Z9221 Personal history of antineoplastic chemotherapy: Secondary | ICD-10-CM | POA: Diagnosis not present

## 2024-03-20 DIAGNOSIS — K219 Gastro-esophageal reflux disease without esophagitis: Secondary | ICD-10-CM | POA: Diagnosis not present

## 2024-03-20 DIAGNOSIS — Z8501 Personal history of malignant neoplasm of esophagus: Secondary | ICD-10-CM | POA: Diagnosis not present

## 2024-03-20 DIAGNOSIS — C158 Malignant neoplasm of overlapping sites of esophagus: Secondary | ICD-10-CM | POA: Diagnosis not present

## 2024-03-20 DIAGNOSIS — R918 Other nonspecific abnormal finding of lung field: Secondary | ICD-10-CM | POA: Diagnosis not present

## 2024-03-20 DIAGNOSIS — I700 Atherosclerosis of aorta: Secondary | ICD-10-CM | POA: Diagnosis not present

## 2024-03-20 DIAGNOSIS — N400 Enlarged prostate without lower urinary tract symptoms: Secondary | ICD-10-CM | POA: Diagnosis not present

## 2024-03-20 DIAGNOSIS — I10 Essential (primary) hypertension: Secondary | ICD-10-CM | POA: Diagnosis not present

## 2024-03-20 NOTE — Progress Notes (Signed)
 [facility]   Telephone:([phone]) [phone] Fax:(336) [phone]   Clinic Follow up Note   Patient Care Team: Aloha Arnold, Kirby Peoples as PCP - General (Physician Assistant) Sonja [place], MD as Consulting Physician (Oncology)  Date of Service:  03/20/2024  CHIEF COMPLAINT: f/u of esophageal cancer  CURRENT THERAPY:  Cancer surveillance  Oncology History   Malignant neoplasm of overlapping sites of esophagus (HCC) yp(T3, N2) with upper mediastinal adenopathy  -diagnosed in 03/2021 -s/p concurrent chemoRT 05/24/21 - 07/04/21. He started with chemo TC then switched to FOLFOX on 06/20/21. He completed 4 cycles on 08/08/21. Posttreatment PET scan showed excellent response. -s/p esophagogastrectomy on 09/05/21 under Dr. Deloise Ferries. -He has completed one year adjuvant Nivo in Nov 2023, tolerated very well  -Surveillance CT scan from Mar 20, 2023 showed no evidence of recurrence, the irregular nodular consolidation in the anterior right lower lobe is likely infectious, probable from aspiration pneumonia, he is asymptomatic.  Will continue follow-up --surveillance CT scan on 02/22/2024 showed many aspiration pneumonia and unchanged prominent pretracheal lymph node measuring up to 1.1 cm, will obtain Signatera  -Continue cancer surveillance  Assessment & Plan Esophageal cancer Three years post-diagnosis with no signs of recurrence on recent CT scan. Lymph nodes in the mid-chest appear stable over 2 years and likely benign, likely related to previous surgery. No new symptoms suggestive of recurrence. Risk of recurrence decreases significantly after three years. - Continue follow-up every 6 months for two more years. - Monitor for symptoms such as stomach pain, significant weight loss, or fatigue. - Perform labs at next visit.  Gastroesophageal reflux disease (GERD) Intermittent acid reflux with aspiration event on Mar 04, 2024, causing cough for a week. CT scan shows signs of aspiration. GERD  managed with acid reflux medication. Risk of aspiration due to anatomical changes post-surgery. - Advise sleeping with head elevated to prevent aspiration. - Continue acid reflux medication.  Hypertension On antihypertensive medication. No new symptoms reported. - Continue current antihypertensive medication.  Plan - Scan reviewed, NED - Continue cancer surveillance we will see him back with labs in 6 months he will follow-up with PCP    SUMMARY OF ONCOLOGIC HISTORY: Oncology History Overview Note   Cancer Staging  Malignant neoplasm of overlapping sites of esophagus [facility]) Staging form: Esophagus - Adenocarcinoma, AJCC 8th Edition - Clinical stage from 05/13/2021: Stage IVA (cT3, cN2, cM0) - Signed by Sonja Ardmore, MD on 05/22/2021    Malignant neoplasm of overlapping sites of esophagus (HCC)  04/18/2021 Imaging   ULTRASOUND OF HEAD/NECK SOFT TISSUES  IMPRESSION: No suspicious lymphadenopathy   04/19/2021 Procedure   EGD  Impression: - Esophageal polyp(s) were found. Biopsied. - Partially obstructing, likely malignant esophageal tumor was found in the distal esophagus. - Small hiatal hernia. - A single gastric polyp. Biopsied. - Normal duodenal bulb, first portion of the duodenum and second portion of the duodenum.   04/19/2021 Pathology Results   FINAL MICROSCOPIC DIAGNOSIS: Stomach, Biopsy - FUNDIC GLAND POLYP. Negative for dysplasia. NO Helicobacter pylori organisms seen on H and E stain. Esophagus- Distal, Biopsy - INVASIVE MODERATELY DIFFERENTIATED ADENOCARCINOMA OF THE ESOPHAGUS.  - HER2 Study by Immunostain: Negative (score 1+) Esophagus- Mid, Biopsy - INVASIVE MODERATELY DIFFERENTIATED ADENOCARCINOMA OF THE ESOPHAGUS. - HER2 Study by Immunostain: Negative (score 1+)   05/05/2021 Imaging   CT CAP  IMPRESSION: 1. The patient's known esophageal mass is not well visualized. There is mild irregular wall thickening of the distal esophagus. Correlate with previous  clinical evaluation. 2. Indeterminate  mildly enlarged high right paratracheal node near the thoracic inlet. No other mediastinal adenopathy. 3. No evidence of metastatic disease in the abdomen or pelvis. 4. Stable small pulmonary nodules bilaterally consistent with benign findings. 5. Cholelithiasis, small renal cysts and Aortic Atherosclerosis (ICD10-I70.0).   05/09/2021 Initial Diagnosis   Malignant neoplasm of overlapping sites of esophagus (HCC)   05/11/2021 Procedure   Upper EUS  Impression: - Mucosal nodule found in the esophagus. - Partially obstructing, malignant esophageal tumor was found at the gastroesophageal junction. - At least 3 discrete peritumoral lymph nodes - A mass was found in the gastroesophageal junction. A tissue diagnosis was obtained prior to this exam. This is of adenocarcinoma. This was staged T3 N2 Mx by endosonographic criteria. - Unable to traverse GE junction with either EGD scope or radial EUS scope.   05/13/2021 Cancer Staging   Staging form: Esophagus - Adenocarcinoma, AJCC 8th Edition - Clinical stage from 05/13/2021: Stage IVA (cT3, cN2, cM0) - Signed by Sonja Quinebaug, MD on 05/22/2021   05/16/2021 PET scan   IMPRESSION: 1. Distal esophageal mass measuring about 6.2 cm in length, maximum SUV 13.1. 2. Mildly hypermetabolic and mildly enlarged upper right paratracheal lymph node, 1.3 cm in short axis with maximum SUV 3.6, concerning for malignant involvement. 3. Hypodense 1.1 cm right thyroid  nodule has a maximum SUV substantially above that of the background thyroid . A significant minority of such nodules can harbor  thyroid  cancer. Recommend thyroid  US  and biopsy (ref: J Am Coll Radiol. 2015 Feb;12(2): 143-50). 4. 3 by 4 mm right lower lobe pulmonary nodule is similar to 05/05/2021, not hypermetabolic but below sensitive PET-CT size thresholds. Surveillance recommended. 5. Other imaging findings of potential clinical significance: Chronic left maxillary  sinusitis. Aortic Atherosclerosis (ICD10-I70.0). Cholelithiasis. Prostatomegaly.   05/24/2021 - 06/13/2021 Chemotherapy         05/26/2021 Pathology Results   FINAL MICROSCOPIC DIAGNOSIS:   A. LYMPH NODE, 2R, FINE NEEDLE ASPIRATION:  - Malignant cells consistent with non-small cell carcinoma   COMMENT:  The cells are positive for cytokeratin 20 and CDX-2 (patchy). TTF-1, NapsinA, cytokeratin 5/6, p40, S100, MelanA, and cytokeratin 7 are negative. The patient's history of esophageal adenocarcinoma is noted and CDX-2 positivity is consistent with a gastrointestinal primary. This cytokeratin7/20 profile is usually seen in lower tract tumors, but this pattern can be seen in gastric/esophageal cases.   05/27/2021 - 07/04/2021 Radiation Therapy   Per Dr. Lurena Sally   06/20/2021 - 08/08/2021 Chemotherapy   Patient is on Treatment Plan : GASTROESOPHAGEAL FOLFOX q14d x 6 cycles     08/04/2021 PET scan   IMPRESSION: 1. Interval development of a new 6 mm short axis right cervical node with hypermetabolism. Finding is somewhat indeterminate given the apparent response of the hypermetabolic right paratracheal lymphadenopathy seen on the previous study. Close follow-up recommended as metastatic disease not excluded. 2. Diffuse hypermetabolic FDG accumulation noted in the esophagus today with more diffuse circumferential esophageal wall thickening on today's study than on the previous exam. Hypermetabolism in the distal esophagus at the level of the hypermetabolic neoplasm previously has decreased from SUV max = 13 to SUV max = 7 today. 3. Stable tiny 3-4 mm right lower lobe and left upper lobe pulmonary nodules. Continued attention on follow-up recommended. 4. Cholelithiasis. 5. Emphysema.  (ICD10-J43.9) 6.  Aortic Atherosclerois (ICD10-170.0)   08/17/2021 Pathology Results   FINAL MICROSCOPIC DIAGNOSIS:  A. LYMPH NODE, RIGHT CERVICAL, FINE NEEDLE ASPIRATION:  - No malignant cells identified  - See comment  COMMENT:  There is scant cellularity and the specimen may not be representative.    09/05/2021 Definitive Surgery   FINAL MICROSCOPIC DIAGNOSIS:   A. ESOPHAGOGASTRECTOMY:  - Invasive poorly differentiated adenocarcinoma.  - Metastatic carcinoma involving 5 of 13 lymph nodes (5/13).  - See oncology table below.   B. LYMPH NODE, LEVEL 8, EXCISION:  - One lymph node, negative for malignancy (0/1).   09/05/2021 Cancer Staging   Staging form: Esophagus - Adenocarcinoma, AJCC 8th Edition - Pathologic stage from 09/05/2021: Stage IIIB (ypT3, pN2, cM0, G3) - Signed by Sonja Ravenden, MD on 12/03/2021 Stage prefix: Post-therapy Response to neoadjuvant therapy: Partial response Histologic grading system: 3 grade system Residual tumor (R): R0 - None   10/10/2021 - 06/19/2022 Chemotherapy   Patient is on Treatment Plan : GASTROESOPHAGEAL Nivolumab  q14d x 8 cycles / Nivolumab  q28d     10/10/2021 -  Chemotherapy   Patient is on Treatment Plan : GASTROESOPHAGEAL Nivolumab  (240) q14d x 8 cycles / Nivolumab  (480) q28d     12/30/2021 Imaging   EXAM: CT CHEST, ABDOMEN, AND PELVIS WITH CONTRAST  IMPRESSION: Status post esophagectomy with gastric pull-through procedure. No evidence of recurrent or metastatic carcinoma within the chest, abdomen, or pelvis.   Cholelithiasis. No radiographic evidence of cholecystitis.   Stable enlarged prostate.   Aortic Atherosclerosis (ICD10-I70.0).      Discussed the use of AI scribe software for clinical note transcription with the patient, who gave verbal consent to proceed.  History of Present Illness JOS CYGAN is a 68 year old male with gastric cancer who presents for follow-up.  He experienced an aspiration event on Mar 04, 2024, following acid reflux, which led to coughing and vomiting. The cough lasted for about a week and a half. Reflux episodes occur approximately every six months. A recent CT scan showed aspiration and a slightly enlarged  lymph node in the mid-chest, present since his gastric surgery on September 03, 2021.  He manages his condition by eating small, frequent meals and maintaining his weight at 169 pounds. He sleeps in a recliner to keep his head elevated, which helps manage reflux symptoms. He is on medication for acid reflux and blood pressure. No new cough or breathing issues have been noted since the aspiration event.     All other systems were reviewed with the patient and are negative.  MEDICAL HISTORY:  Past Medical History:  Diagnosis Date   Arthritis    BPH (benign prostatic hyperplasia)    Dyslipidemia    Elevated PSA    Esophageal cancer (HCC)    Full dentures    GERD (gastroesophageal reflux disease)    History of melanoma excision    07/ 2011  s/p  wide excision left back--- per pathology-- negative maligant --  mild melanocytic atypia   History of palpitations    cardiologist --  dr Magnus Schuller (last note in epic 2014   Hypertension    Sleep apnea     SURGICAL HISTORY: Past Surgical History:  Procedure Laterality Date   COLONOSCOPY  10/24/2007   ESOPHAGOGASTRODUODENOSCOPY N/A 09/05/2021   Procedure: ESOPHAGOGASTRODUODENOSCOPY (EGD);  Surgeon: Hilarie Lovely, MD;  Location: [facility] OR;  Service: Thoracic;  Laterality: N/A;   ESOPHAGOGASTRODUODENOSCOPY (EGD) WITH PROPOFOL  N/A 05/11/2021   Procedure: ESOPHAGOGASTRODUODENOSCOPY (EGD) WITH PROPOFOL ;  Surgeon: Evangeline Hilts, MD;  Location: [facility] ENDOSCOPY;  Service: Endoscopy;  Laterality: N/A;   EUS N/A 05/11/2021   Procedure: ESOPHAGEAL ENDOSCOPIC ULTRASOUND (EUS) RADIAL;  Surgeon: Evangeline Hilts, MD;  Location: [facility] ENDOSCOPY;  Service: Endoscopy;  Laterality: N/A;   EYE SURGERY     Lasik surgery on both eyes, but still needs glasses   FINE NEEDLE ASPIRATION  05/26/2021   Procedure: FINE NEEDLE ASPIRATION (FNA) LINEAR;  Surgeon: Prudy Brownie, DO;  Location: [facility] ENDOSCOPY;  Service: Pulmonary;;   INTERCOSTAL NERVE BLOCK  09/05/2021    Procedure: INTERCOSTAL NERVE BLOCK;  Surgeon: Hilarie Lovely, MD;  Location: [facility] OR;  Service: Thoracic;;   IR IMAGING GUIDED PORT INSERTION  06/30/2021   IR REMOVAL TUN ACCESS W/ PORT W/O FL MOD SED  10/01/2023   JEJUNOSTOMY  09/05/2021   Procedure: Tonette Franco;  Surgeon: Hilarie Lovely, MD;  Location: [facility] OR;  Service: Thoracic;;   PROSTATE BIOPSY N/A 07/02/2015   Procedure: BIOPSY TRANSRECTAL ULTRASONIC PROSTATE (TUBP);  Surgeon: Jock Muller, MD;  Location: [facility];  Service: Urology;  Laterality: N/A;   PROSTATE BIOPSY N/A 11/22/2015   Procedure: BIOPSY TRANSRECTAL ULTRASONIC PROSTATE (TUBP);  Surgeon: Marco Severs, MD;  Location: [facility];  Service: Urology;  Laterality: N/A;   TRANSTHORACIC ECHOCARDIOGRAM  11/19/2012   normal echo/  trivial TR   VIDEO BRONCHOSCOPY WITH ENDOBRONCHIAL ULTRASOUND N/A 05/26/2021   Procedure: VIDEO BRONCHOSCOPY WITH ENDOBRONCHIAL ULTRASOUND;  Surgeon: Prudy Brownie, DO;  Location: [facility];  Service: Pulmonary;  Laterality: N/A;   WIDE EXCISION MELANOMA LEFT BACK  05/02/2010    I have reviewed the social history and family history with the patient and they are unchanged from previous note.  ALLERGIES:  is allergic to bee venom.  MEDICATIONS:  Current Outpatient Medications  Medication Sig Dispense Refill   aspirin  EC 81 MG tablet Take 81 mg by mouth in the morning.     ketoconazole  (NIZORAL ) 2 % cream APPLY 1 APPLICATION TOPICALLY DAILY 15 g 0   metoprolol  succinate (TOPROL -XL) 50 MG 24 hr tablet Take 50 mg by mouth daily. Take with or immediately following a meal.     Multiple Vitamin (MULTIVITAMIN WITH MINERALS) TABS tablet Take 1 tablet by mouth daily.     omeprazole  (PRILOSEC) 40 MG capsule Take 40 mg by mouth 2 (two) times daily.     Skin Protectants, Misc. (EUCERIN) cream Apply 1 application. topically daily as needed for dry skin.     No current facility-administered medications for this  visit.    PHYSICAL EXAMINATION: ECOG PERFORMANCE STATUS: 0 - Asymptomatic  Vitals:   03/20/24 1308  BP: (!) 142/80  Pulse: 66  Resp: 16  Temp: 97.8 F (36.6 C)  SpO2: 99%   Wt Readings from Last 3 Encounters:  03/20/24 169 lb 11.2 oz (77 kg)  12/18/23 171 lb (77.6 kg)  09/17/23 169 lb 2 oz (76.7 kg)     GENERAL:alert, no distress and comfortable SKIN: skin color, texture, turgor are normal, no rashes or significant lesions EYES: normal, Conjunctiva are pink and non-injected, sclera clear Musculoskeletal:no cyanosis of digits and no clubbing  NEURO: alert & oriented x 3 with fluent speech, no focal motor/sensory deficits  Physical Exam MEASUREMENTS: Weight- 169.  LABORATORY DATA:  I have reviewed the data as listed    Latest Ref Rng & Units 03/11/2024    9:04 AM 12/18/2023    1:04 PM 09/10/2023   12:28 PM  CBC  WBC 4.0 - 10.5 K/uL 4.7  4.4  4.6   Hemoglobin 13.0 - 17.0 g/dL 47.8  29.5  62.1   Hematocrit 39.0 - 52.0 % 39.8  38.5  37.6   Platelets 150 - 400 K/uL 187  180  187         Latest Ref Rng & Units 03/11/2024    9:04 AM 12/18/2023    1:04 PM 09/10/2023   12:28 PM  CMP  Glucose 70 - 99 mg/dL 578  92  469   BUN 8 - 23 mg/dL 16  14  15    Creatinine 0.61 - 1.24 mg/dL 6.29  5.28  4.13   Sodium 135 - 145 mmol/L 138  140  138   Potassium 3.5 - 5.1 mmol/L 4.0  3.7  4.1   Chloride 98 - 111 mmol/L 104  106  104   CO2 22 - 32 mmol/L 28  27  28    Calcium  8.9 - 10.3 mg/dL 9.3  9.2  24.4   Total Protein 6.5 - 8.1 g/dL 7.4  7.3  6.9   Total Bilirubin 0.0 - 1.2 mg/dL 0.5  0.5  0.5   Alkaline Phos 38 - 126 U/L 62  56  54   AST 15 - 41 U/L 21  22  18    ALT 0 - 44 U/L 25  25  20        RADIOGRAPHIC STUDIES: I have personally reviewed the radiological images as listed and agreed with the findings in the report. No results found.    No orders of the defined types were placed in this encounter.  All questions were answered. The patient knows to call the clinic  with any problems, questions or concerns. No barriers to learning was detected. The total time spent in the appointment was 25 minutes, including review of chart and various tests results, discussions about plan of care and coordination of care plan     Sonja Waldwick, MD 03/20/2024

## 2024-03-20 NOTE — Assessment & Plan Note (Signed)
 yp(T3, N2) with upper mediastinal adenopathy  -diagnosed in 03/2021 -s/p concurrent chemoRT 05/24/21 - 07/04/21. He started with chemo TC then switched to FOLFOX on 06/20/21. He completed 4 cycles on 08/08/21. Posttreatment PET scan showed excellent response. -s/p esophagogastrectomy on 09/05/21 under Dr. Deloise Ferries. -He has completed one year adjuvant Nivo in Nov 2023, tolerated very well  -Surveillance CT scan from Mar 20, 2023 showed no evidence of recurrence, the irregular nodular consolidation in the anterior right lower lobe is likely infectious, probable from aspiration pneumonia, he is asymptomatic.  Will continue follow-up --surveillance CT scan on 02/22/2024 showed many aspiration pneumonia and unchanged prominent pretracheal lymph node measuring up to 1.1 cm, will obtain Signatera  -Continue cancer surveillance

## 2024-03-21 ENCOUNTER — Other Ambulatory Visit: Payer: Self-pay

## 2024-04-13 ENCOUNTER — Other Ambulatory Visit: Payer: Self-pay

## 2024-07-09 DIAGNOSIS — I10 Essential (primary) hypertension: Secondary | ICD-10-CM | POA: Diagnosis not present

## 2024-07-09 DIAGNOSIS — K219 Gastro-esophageal reflux disease without esophagitis: Secondary | ICD-10-CM | POA: Diagnosis not present

## 2024-07-09 DIAGNOSIS — Z8501 Personal history of malignant neoplasm of esophagus: Secondary | ICD-10-CM | POA: Diagnosis not present

## 2024-07-09 DIAGNOSIS — E782 Mixed hyperlipidemia: Secondary | ICD-10-CM | POA: Diagnosis not present

## 2024-07-09 DIAGNOSIS — Z79899 Other long term (current) drug therapy: Secondary | ICD-10-CM | POA: Diagnosis not present

## 2024-08-25 DIAGNOSIS — C61 Malignant neoplasm of prostate: Secondary | ICD-10-CM | POA: Diagnosis not present

## 2024-08-28 DIAGNOSIS — L814 Other melanin hyperpigmentation: Secondary | ICD-10-CM | POA: Diagnosis not present

## 2024-08-28 DIAGNOSIS — Z08 Encounter for follow-up examination after completed treatment for malignant neoplasm: Secondary | ICD-10-CM | POA: Diagnosis not present

## 2024-08-28 DIAGNOSIS — L821 Other seborrheic keratosis: Secondary | ICD-10-CM | POA: Diagnosis not present

## 2024-08-28 DIAGNOSIS — L570 Actinic keratosis: Secondary | ICD-10-CM | POA: Diagnosis not present

## 2024-08-28 DIAGNOSIS — L218 Other seborrheic dermatitis: Secondary | ICD-10-CM | POA: Diagnosis not present

## 2024-08-28 DIAGNOSIS — Z85820 Personal history of malignant melanoma of skin: Secondary | ICD-10-CM | POA: Diagnosis not present

## 2024-08-28 DIAGNOSIS — D225 Melanocytic nevi of trunk: Secondary | ICD-10-CM | POA: Diagnosis not present

## 2024-09-03 DIAGNOSIS — R972 Elevated prostate specific antigen [PSA]: Secondary | ICD-10-CM | POA: Diagnosis not present

## 2024-09-03 DIAGNOSIS — N401 Enlarged prostate with lower urinary tract symptoms: Secondary | ICD-10-CM | POA: Diagnosis not present

## 2024-09-03 DIAGNOSIS — R399 Unspecified symptoms and signs involving the genitourinary system: Secondary | ICD-10-CM | POA: Diagnosis not present

## 2024-09-03 DIAGNOSIS — R351 Nocturia: Secondary | ICD-10-CM | POA: Diagnosis not present

## 2024-09-08 ENCOUNTER — Other Ambulatory Visit: Payer: Self-pay

## 2024-09-08 DIAGNOSIS — C158 Malignant neoplasm of overlapping sites of esophagus: Secondary | ICD-10-CM

## 2024-09-08 NOTE — Progress Notes (Unsigned)
 Patient Care Team: [name], DEVONNA as PCP - General (Physician Assistant) [name], MD as Consulting Physician (Oncology)  Clinic Day:  09/09/2024  Referring physician: [name], PA-C  ASSESSMENT & PLAN:   Assessment & Plan: Malignant neoplasm of overlapping sites of esophagus (HCC) yp(T3, N2) with upper mediastinal adenopathy  -diagnosed in 03/2021 -s/p concurrent chemoRT 05/24/21 - 07/04/21. He started with chemo TC then switched to FOLFOX on 06/20/21. He completed 4 cycles on 08/08/21. Posttreatment PET scan showed excellent response. -s/p esophagogastrectomy on 09/05/21 under Dr. Shyrl. -He has completed one year adjuvant Nivo in Nov 2023, tolerated very well  -Surveillance CT scan from Mar 20, 2023 showed no evidence of recurrence, the irregular nodular consolidation in the anterior right lower lobe is likely infectious, probable from aspiration pneumonia, he is asymptomatic.  Will continue follow-up --surveillance CT scan on 03/11/2024 showed cluster of small nodules in right lung base, consistent with aspiration pneumonia, along with unchanged prominent pretracheal lymph node measuring up to 1.1 cm.  -- Surveillance CT CAP ordered for May 2026 to evaluate lung nodules seen on previous CT. -Continue cancer surveillance    Esophageal cancer Three years post-diagnosis with no signs of recurrence on most recent CT scan from 03/11/2024. SABRA Lymph nodes in the mid-chest appear stable over 2 years and likely benign, likely related to previous surgery. No new symptoms suggestive of recurrence. Risk of recurrence decreases significantly after three years. - Continue follow-up every 6 months for two more years. - surveillance CT CAP in 02/2025 due to nodular cluster in right lung base. These were felt to be benign, possibly evidence of aspiration.  Close attention advised on follow-up. - Monitor for symptoms such as stomach pain, significant weight loss, or fatigue. - Perform labs at  next visit.  Plan Labs reviewed. -CBC and CMP both unremarkable. Patient without new signs or symptoms concerning for recurrence or metastatic disease. Surveillance CT CAP ordered for May 2026. Plan for labs in 6 months, sooner if needed.    The patient understands the plans discussed today and is in agreement with them.  He knows to contact our office if he develops concerns prior to his next appointment.  I provided 25 minutes of face-to-face time during this encounter and > 50% was spent counseling as documented under my assessment and plan.    [name], NP  [facility] - A DEPT OF [facility] [address] [place] [address] Dept: [phone] Dept Fax: [phone]   Orders Placed This Encounter  Procedures   CT CHEST ABDOMEN PELVIS W CONTRAST    Standing Status:   Future    Expected Date:   03/12/2025    Expiration Date:   09/09/2025    If indicated for the ordered procedure, I authorize the administration of contrast media per Radiology protocol:   Yes    Does the patient have a contrast media/X-ray dye allergy?:   No    If indicated for the ordered procedure, I authorize the administration of oral contrast media per Radiology protocol:   Yes    Preferred imaging location?:   [facility]      CHIEF COMPLAINT:  CC: Esophageal cancer  Current Treatment: Cancer surveillance  INTERVAL HISTORY:  Nathaniel Jennings is here today for repeat clinical assessment.  He last saw Dr. Lanny on 03/20/2024.  States he has been doing well.  Reports waking up with some nasal congestion  and sinus pressure.  Denies joint or muscle pain.  He states his appetite has been good.  Denies abdominal pain.  Denies nausea, vomiting, diarrhea, constipation.  Denies concerns with neuropathy in fingers or toes.  Denies chest pain, chest pressure, shortness of breath.  He denies headaches or visual disturbance.  He denies fevers  or chills. He denies pain. His appetite is good. His weight has been stable.  I have reviewed the past medical history, past surgical history, social history and family history with the patient and they are unchanged from previous note.  ALLERGIES:  is allergic to bee venom.  MEDICATIONS:  Current Outpatient Medications  Medication Sig Dispense Refill   aspirin  EC 81 MG tablet Take 81 mg by mouth in the morning.     ketoconazole  (NIZORAL ) 2 % cream APPLY 1 APPLICATION TOPICALLY DAILY 15 g 0   metoprolol  succinate (TOPROL -XL) 50 MG 24 hr tablet Take 50 mg by mouth daily. Take with or immediately following a meal.     Multiple Vitamin (MULTIVITAMIN WITH MINERALS) TABS tablet Take 1 tablet by mouth daily.     omeprazole  (PRILOSEC) 40 MG capsule Take 40 mg by mouth 2 (two) times daily.     Skin Protectants, Misc. (EUCERIN) cream Apply 1 application. topically daily as needed for dry skin.     No current facility-administered medications for this visit.    HISTORY OF PRESENT ILLNESS:   Oncology History Overview Note   Cancer Staging  Malignant neoplasm of overlapping sites of esophagus [facility]) Staging form: Esophagus - Adenocarcinoma, AJCC 8th Edition - Clinical stage from 05/13/2021: Stage IVA (cT3, cN2, cM0) - Signed by [name], MD on 05/22/2021    Malignant neoplasm of overlapping sites of esophagus (HCC)  04/18/2021 Imaging   ULTRASOUND OF HEAD/NECK SOFT TISSUES  IMPRESSION: No suspicious lymphadenopathy   04/19/2021 Procedure   EGD  Impression: - Esophageal polyp(s) were found. Biopsied. - Partially obstructing, likely malignant esophageal tumor was found in the distal esophagus. - Small hiatal hernia. - A single gastric polyp. Biopsied. - Normal duodenal bulb, first portion of the duodenum and second portion of the duodenum.   04/19/2021 Pathology Results   FINAL MICROSCOPIC DIAGNOSIS: Stomach, Biopsy - FUNDIC GLAND POLYP. Negative for dysplasia. NO Helicobacter pylori  organisms seen on H and E stain. Esophagus- Distal, Biopsy - INVASIVE MODERATELY DIFFERENTIATED ADENOCARCINOMA OF THE ESOPHAGUS.  - HER2 Study by Immunostain: Negative (score 1+) Esophagus- Mid, Biopsy - INVASIVE MODERATELY DIFFERENTIATED ADENOCARCINOMA OF THE ESOPHAGUS. - HER2 Study by Immunostain: Negative (score 1+)   05/05/2021 Imaging   CT CAP  IMPRESSION: 1. The patient's known esophageal mass is not well visualized. There is mild irregular wall thickening of the distal esophagus. Correlate with previous clinical evaluation. 2. Indeterminate mildly enlarged high right paratracheal node near the thoracic inlet. No other mediastinal adenopathy. 3. No evidence of metastatic disease in the abdomen or pelvis. 4. Stable small pulmonary nodules bilaterally consistent with benign findings. 5. Cholelithiasis, small renal cysts and Aortic Atherosclerosis (ICD10-I70.0).   05/09/2021 Initial Diagnosis   Malignant neoplasm of overlapping sites of esophagus (HCC)   05/11/2021 Procedure   Upper EUS  Impression: - Mucosal nodule found in the esophagus. - Partially obstructing, malignant esophageal tumor was found at the gastroesophageal junction. - At least 3 discrete peritumoral lymph nodes - A mass was found in the gastroesophageal junction. A tissue diagnosis was obtained prior to this exam. This is of adenocarcinoma. This was staged T3 N2 Mx by endosonographic criteria. -  Unable to traverse GE junction with either EGD scope or radial EUS scope.   05/13/2021 Cancer Staging   Staging form: Esophagus - Adenocarcinoma, AJCC 8th Edition - Clinical stage from 05/13/2021: Stage IVA (cT3, cN2, cM0) - Signed by [name], MD on 05/22/2021   05/16/2021 PET scan   IMPRESSION: 1. Distal esophageal mass measuring about 6.2 cm in length, maximum SUV 13.1. 2. Mildly hypermetabolic and mildly enlarged upper right paratracheal lymph node, 1.3 cm in short axis with maximum SUV 3.6, concerning for malignant  involvement. 3. Hypodense 1.1 cm right thyroid  nodule has a maximum SUV substantially above that of the background thyroid . A significant minority of such nodules can harbor  thyroid  cancer. Recommend thyroid  US  and biopsy (ref: J Am Coll Radiol. 2015 Feb;12(2): 143-50). 4. 3 by 4 mm right lower lobe pulmonary nodule is similar to 05/05/2021, not hypermetabolic but below sensitive PET-CT size thresholds. Surveillance recommended. 5. Other imaging findings of potential clinical significance: Chronic left maxillary sinusitis. Aortic Atherosclerosis (ICD10-I70.0). Cholelithiasis. Prostatomegaly.   05/24/2021 - 06/13/2021 Chemotherapy         05/26/2021 Pathology Results   FINAL MICROSCOPIC DIAGNOSIS:   A. LYMPH NODE, 2R, FINE NEEDLE ASPIRATION:  - Malignant cells consistent with non-small cell carcinoma   COMMENT:  The cells are positive for cytokeratin 20 and CDX-2 (patchy). TTF-1, NapsinA, cytokeratin 5/6, p40, S100, MelanA, and cytokeratin 7 are negative. The patient's history of esophageal adenocarcinoma is noted and CDX-2 positivity is consistent with a gastrointestinal primary. This cytokeratin7/20 profile is usually seen in lower tract tumors, but this pattern can be seen in gastric/esophageal cases.   05/27/2021 - 07/04/2021 Radiation Therapy   Per Dr. Izell   06/20/2021 - 08/08/2021 Chemotherapy   Patient is on Treatment Plan : GASTROESOPHAGEAL FOLFOX q14d x 6 cycles     08/04/2021 PET scan   IMPRESSION: 1. Interval development of a new 6 mm short axis right cervical node with hypermetabolism. Finding is somewhat indeterminate given the apparent response of the hypermetabolic right paratracheal lymphadenopathy seen on the previous study. Close follow-up recommended as metastatic disease not excluded. 2. Diffuse hypermetabolic FDG accumulation noted in the esophagus today with more diffuse circumferential esophageal wall thickening on today's study than on the previous exam.  Hypermetabolism in the distal esophagus at the level of the hypermetabolic neoplasm previously has decreased from SUV max = 13 to SUV max = 7 today. 3. Stable tiny 3-4 mm right lower lobe and left upper lobe pulmonary nodules. Continued attention on follow-up recommended. 4. Cholelithiasis. 5. Emphysema.  (ICD10-J43.9) 6.  Aortic Atherosclerois (ICD10-170.0)   08/17/2021 Pathology Results   FINAL MICROSCOPIC DIAGNOSIS:  A. LYMPH NODE, RIGHT CERVICAL, FINE NEEDLE ASPIRATION:  - No malignant cells identified  - See comment   COMMENT:  There is scant cellularity and the specimen may not be representative.    09/05/2021 Definitive Surgery   FINAL MICROSCOPIC DIAGNOSIS:   A. ESOPHAGOGASTRECTOMY:  - Invasive poorly differentiated adenocarcinoma.  - Metastatic carcinoma involving 5 of 13 lymph nodes (5/13).  - See oncology table below.   B. LYMPH NODE, LEVEL 8, EXCISION:  - One lymph node, negative for malignancy (0/1).   09/05/2021 Cancer Staging   Staging form: Esophagus - Adenocarcinoma, AJCC 8th Edition - Pathologic stage from 09/05/2021: Stage IIIB (ypT3, pN2, cM0, G3) - Signed by [name], MD on 12/03/2021 Stage prefix: Post-therapy Response to neoadjuvant therapy: Partial response Histologic grading system: 3 grade system Residual tumor (R): R0 - None   10/10/2021 -  06/19/2022 Chemotherapy   Patient is on Treatment Plan : GASTROESOPHAGEAL Nivolumab  q14d x 8 cycles / Nivolumab  q28d     10/10/2021 -  Chemotherapy   Patient is on Treatment Plan : GASTROESOPHAGEAL Nivolumab  (240) q14d x 8 cycles / Nivolumab  (480) q28d     12/30/2021 Imaging   EXAM: CT CHEST, ABDOMEN, AND PELVIS WITH CONTRAST  IMPRESSION: Status post esophagectomy with gastric pull-through procedure. No evidence of recurrent or metastatic carcinoma within the chest, abdomen, or pelvis.   Cholelithiasis. No radiographic evidence of cholecystitis.   Stable enlarged prostate.   Aortic Atherosclerosis  (ICD10-I70.0).   03/11/2024 Imaging   CT CAP with contrast  IMPRESSION: 1. Status post pull-through esophagectomy. 2. New clustered nodules in the medial right lung base measuring up to 0.7 cm, distribution strongly suggesting minimal aspiration, although warranting close attention on follow-up in the setting of esophageal malignancy. 3. Unchanged prominent pretracheal lymph node measuring up to 1.1 x 0.8 cm. No overtly enlarged lymph nodes in the chest. Continued attention on follow-up. 4. No evidence of lymphadenopathy or metastatic disease in the abdomen or pelvis. 5. Hepatic steatosis. 6. Severe prostatomegaly.         REVIEW OF SYSTEMS:   Constitutional: Denies fevers, chills or abnormal weight loss Eyes: Denies blurriness of vision Ears, nose, mouth, throat, and face: Denies mucositis or sore throat Respiratory: Denies cough, dyspnea or wheezes Cardiovascular: Denies palpitation, chest discomfort or lower extremity swelling Gastrointestinal:  Denies nausea, heartburn or change in bowel habits Skin: Denies abnormal skin rashes Lymphatics: Denies new lymphadenopathy or easy bruising Neurological:Denies numbness, tingling or new weaknesses Behavioral/Psych: Mood is stable, no new changes  All other systems were reviewed with the patient and are negative.   VITALS:   Today's Vitals   09/09/24 1055  BP: 138/78  Pulse: (!) 103  Resp: 17  Temp: (!) 97.1 F (36.2 C)  SpO2: 96%  Weight: 170 lb 9.6 oz (77.4 kg)   Body mass index is 27.54 kg/m.   Wt Readings from Last 3 Encounters:  09/09/24 170 lb 9.6 oz (77.4 kg)  03/20/24 169 lb 11.2 oz (77 kg)  12/18/23 171 lb (77.6 kg)    Body mass index is 27.54 kg/m.  Performance status (ECOG): 0 - Asymptomatic  PHYSICAL EXAM:   GENERAL:alert, no distress and comfortable SKIN: skin color, texture, turgor are normal, no rashes or significant lesions EYES: normal, Conjunctiva are pink and non-injected, sclera  clear OROPHARYNX:no exudate, no erythema and lips, buccal mucosa, and tongue normal  NECK: supple, thyroid  normal size, non-tender, without nodularity LYMPH:  no palpable lymphadenopathy in the cervical, axillary or inguinal LUNGS: clear to auscultation and percussion with normal breathing effort HEART: regular rate & rhythm and no murmurs and no lower extremity edema ABDOMEN:abdomen soft, non-tender and normal bowel sounds Musculoskeletal:no cyanosis of digits and no clubbing  NEURO: alert & oriented x 3 with fluent speech, no focal motor/sensory deficits  LABORATORY DATA:  I have reviewed the data as listed    Component Value Date/Time   NA 137 09/09/2024 1031   K 4.0 09/09/2024 1031   CL 101 09/09/2024 1031   CO2 28 09/09/2024 1031   GLUCOSE 115 (H) 09/09/2024 1031   BUN 12 09/09/2024 1031   CREATININE 1.01 09/09/2024 1031   CALCIUM  9.6 09/09/2024 1031   PROT 7.7 09/09/2024 1031   ALBUMIN  4.7 09/09/2024 1031   AST 21 09/09/2024 1031   ALT 20 09/09/2024 1031   ALKPHOS 65 09/09/2024 1031  BILITOT 0.8 09/09/2024 1031   GFRNONAA >60 09/09/2024 1031   GFRAA  04/28/2010 1030    >60        The eGFR has been calculated using the MDRD equation. This calculation has not been validated in all clinical situations. eGFR's persistently <60 mL/min signify possible Chronic Kidney Disease.    Lab Results  Component Value Date   WBC 10.8 (H) 09/09/2024   NEUTROABS 9.1 (H) 09/09/2024   HGB 14.4 09/09/2024   HCT 42.1 09/09/2024   MCV 89.2 09/09/2024   PLT 173 09/09/2024

## 2024-09-09 ENCOUNTER — Encounter: Payer: Self-pay | Admitting: Nurse Practitioner

## 2024-09-09 ENCOUNTER — Inpatient Hospital Stay: Admitting: Nurse Practitioner

## 2024-09-09 ENCOUNTER — Inpatient Hospital Stay: Attending: Nurse Practitioner

## 2024-09-09 VITALS — BP 138/78 | HR 103 | Temp 97.1°F | Resp 17 | Wt 170.6 lb

## 2024-09-09 DIAGNOSIS — Z8501 Personal history of malignant neoplasm of esophagus: Secondary | ICD-10-CM | POA: Insufficient documentation

## 2024-09-09 DIAGNOSIS — C158 Malignant neoplasm of overlapping sites of esophagus: Secondary | ICD-10-CM | POA: Diagnosis not present

## 2024-09-09 DIAGNOSIS — Z9221 Personal history of antineoplastic chemotherapy: Secondary | ICD-10-CM | POA: Insufficient documentation

## 2024-09-09 DIAGNOSIS — R918 Other nonspecific abnormal finding of lung field: Secondary | ICD-10-CM | POA: Insufficient documentation

## 2024-09-09 DIAGNOSIS — Z923 Personal history of irradiation: Secondary | ICD-10-CM | POA: Insufficient documentation

## 2024-09-09 LAB — CBC WITH DIFFERENTIAL (CANCER CENTER ONLY)
Abs Immature Granulocytes: 0.05 K/uL (ref 0.00–0.07)
Basophils Absolute: 0.1 K/uL (ref 0.0–0.1)
Basophils Relative: 1 %
Eosinophils Absolute: 0.1 K/uL (ref 0.0–0.5)
Eosinophils Relative: 1 %
HCT: 42.1 % (ref 39.0–52.0)
Hemoglobin: 14.4 g/dL (ref 13.0–17.0)
Immature Granulocytes: 1 %
Lymphocytes Relative: 7 %
Lymphs Abs: 0.8 K/uL (ref 0.7–4.0)
MCH: 30.5 pg (ref 26.0–34.0)
MCHC: 34.2 g/dL (ref 30.0–36.0)
MCV: 89.2 fL (ref 80.0–100.0)
Monocytes Absolute: 0.7 K/uL (ref 0.1–1.0)
Monocytes Relative: 6 %
Neutro Abs: 9.1 K/uL — ABNORMAL HIGH (ref 1.7–7.7)
Neutrophils Relative %: 84 %
Platelet Count: 173 K/uL (ref 150–400)
RBC: 4.72 MIL/uL (ref 4.22–5.81)
RDW: 13.8 % (ref 11.5–15.5)
WBC Count: 10.8 K/uL — ABNORMAL HIGH (ref 4.0–10.5)
nRBC: 0 % (ref 0.0–0.2)

## 2024-09-09 LAB — CMP (CANCER CENTER ONLY)
ALT: 20 U/L (ref 0–44)
AST: 21 U/L (ref 15–41)
Albumin: 4.7 g/dL (ref 3.5–5.0)
Alkaline Phosphatase: 65 U/L (ref 38–126)
Anion gap: 8 (ref 5–15)
BUN: 12 mg/dL (ref 8–23)
CO2: 28 mmol/L (ref 22–32)
Calcium: 9.6 mg/dL (ref 8.9–10.3)
Chloride: 101 mmol/L (ref 98–111)
Creatinine: 1.01 mg/dL (ref 0.61–1.24)
GFR, Estimated: >60 mL/min (ref >60)
Glucose, Bld: 115 mg/dL — ABNORMAL HIGH (ref 70–99)
Potassium: 4 mmol/L (ref 3.5–5.1)
Sodium: 137 mmol/L (ref 135–145)
Total Bilirubin: 0.8 mg/dL (ref 0.0–1.2)
Total Protein: 7.7 g/dL (ref 6.5–8.1)

## 2024-09-09 NOTE — Assessment & Plan Note (Addendum)
 yp(T3, N2) with upper mediastinal adenopathy  -diagnosed in 03/2021 -s/p concurrent chemoRT 05/24/21 - 07/04/21. He started with chemo TC then switched to FOLFOX on 06/20/21. He completed 4 cycles on 08/08/21. Posttreatment PET scan showed excellent response. -s/p esophagogastrectomy on 09/05/21 under Dr. Shyrl. -He has completed one year adjuvant Nivo in Nov 2023, tolerated very well  -Surveillance CT scan from Mar 20, 2023 showed no evidence of recurrence, the irregular nodular consolidation in the anterior right lower lobe is likely infectious, probable from aspiration pneumonia, he is asymptomatic.  Will continue follow-up --surveillance CT scan on 03/11/2024 showed cluster of small nodules in right lung base, consistent with aspiration pneumonia, along with unchanged prominent pretracheal lymph node measuring up to 1.1 cm.  -- Surveillance CT CAP ordered for May 2026 to evaluate lung nodules seen on previous CT. -Continue cancer surveillance

## 2024-09-11 ENCOUNTER — Other Ambulatory Visit: Payer: Self-pay

## 2024-09-12 ENCOUNTER — Other Ambulatory Visit: Payer: Self-pay

## 2025-03-10 ENCOUNTER — Inpatient Hospital Stay: Admitting: Nurse Practitioner

## 2025-03-10 ENCOUNTER — Inpatient Hospital Stay
